# Patient Record
Sex: Male | Born: 1970
Health system: Southern US, Community
[De-identification: ages and names within clinical notes are randomized; demographics above are authoritative.]

## PROBLEM LIST (undated history)

## (undated) ENCOUNTER — Emergency Department (HOSPITAL_COMMUNITY): Admission: EM | Payer: Self-pay | Source: Home / Self Care

## (undated) DIAGNOSIS — G4713 Recurrent hypersomnia: Secondary | ICD-10-CM

## (undated) DIAGNOSIS — G2581 Restless legs syndrome: Secondary | ICD-10-CM

## (undated) DIAGNOSIS — F429 Obsessive-compulsive disorder, unspecified: Secondary | ICD-10-CM

## (undated) DIAGNOSIS — I10 Essential (primary) hypertension: Secondary | ICD-10-CM

## (undated) DIAGNOSIS — E785 Hyperlipidemia, unspecified: Secondary | ICD-10-CM

## (undated) DIAGNOSIS — Z8489 Family history of other specified conditions: Secondary | ICD-10-CM

## (undated) DIAGNOSIS — G1221 Amyotrophic lateral sclerosis: Secondary | ICD-10-CM

## (undated) HISTORY — DX: Hyperlipidemia, unspecified: E78.5

## (undated) HISTORY — DX: Recurrent hypersomnia: G47.13

## (undated) HISTORY — DX: Amyotrophic lateral sclerosis: G12.21

## (undated) HISTORY — DX: Obsessive-compulsive disorder, unspecified: F42.9

## (undated) HISTORY — DX: Family history of other specified conditions: Z84.89

---

## 2000-10-12 ENCOUNTER — Ambulatory Visit (HOSPITAL_BASED_OUTPATIENT_CLINIC_OR_DEPARTMENT_OTHER): Admission: RE | Admit: 2000-10-12 | Discharge: 2000-10-12 | Payer: Self-pay | Admitting: Neurology

## 2000-11-15 ENCOUNTER — Emergency Department (HOSPITAL_COMMUNITY): Admission: EM | Admit: 2000-11-15 | Discharge: 2000-11-15 | Payer: Self-pay | Admitting: Emergency Medicine

## 2002-09-17 ENCOUNTER — Encounter
Admission: RE | Admit: 2002-09-17 | Discharge: 2002-12-16 | Payer: Self-pay | Admitting: Physical Medicine and Rehabilitation

## 2003-08-18 ENCOUNTER — Emergency Department (HOSPITAL_COMMUNITY): Admission: EM | Admit: 2003-08-18 | Discharge: 2003-08-18 | Payer: Self-pay | Admitting: Emergency Medicine

## 2005-04-07 ENCOUNTER — Emergency Department (HOSPITAL_COMMUNITY): Admission: EM | Admit: 2005-04-07 | Discharge: 2005-04-07 | Payer: Self-pay | Admitting: Emergency Medicine

## 2005-05-04 ENCOUNTER — Ambulatory Visit: Payer: Self-pay | Admitting: Family Medicine

## 2005-06-02 ENCOUNTER — Ambulatory Visit: Payer: Self-pay | Admitting: Family Medicine

## 2005-06-07 ENCOUNTER — Ambulatory Visit (HOSPITAL_COMMUNITY): Admission: RE | Admit: 2005-06-07 | Discharge: 2005-06-07 | Payer: Self-pay | Admitting: Family Medicine

## 2006-08-21 ENCOUNTER — Emergency Department (HOSPITAL_COMMUNITY): Admission: EM | Admit: 2006-08-21 | Discharge: 2006-08-21 | Payer: Self-pay | Admitting: Emergency Medicine

## 2009-04-22 ENCOUNTER — Encounter (HOSPITAL_COMMUNITY): Admission: RE | Admit: 2009-04-22 | Discharge: 2009-05-22 | Payer: Self-pay | Admitting: Orthopaedic Surgery

## 2009-10-27 ENCOUNTER — Emergency Department (HOSPITAL_COMMUNITY): Admission: EM | Admit: 2009-10-27 | Discharge: 2009-10-28 | Payer: Self-pay | Admitting: Emergency Medicine

## 2009-11-04 ENCOUNTER — Ambulatory Visit (HOSPITAL_COMMUNITY): Admission: RE | Admit: 2009-11-04 | Discharge: 2009-11-04 | Payer: Self-pay | Admitting: Family Medicine

## 2010-01-21 ENCOUNTER — Encounter (HOSPITAL_COMMUNITY): Admission: RE | Admit: 2010-01-21 | Discharge: 2010-02-20 | Payer: Self-pay | Admitting: Orthopaedic Surgery

## 2010-02-25 ENCOUNTER — Encounter (HOSPITAL_COMMUNITY): Admission: RE | Admit: 2010-02-25 | Discharge: 2010-03-27 | Payer: Self-pay | Admitting: Orthopaedic Surgery

## 2010-12-16 LAB — DIFFERENTIAL
Basophils Absolute: 0.1 10*3/uL (ref 0.0–0.1)
Eosinophils Relative: 1 % (ref 0–5)
Lymphocytes Relative: 34 % (ref 12–46)
Monocytes Absolute: 0.6 10*3/uL (ref 0.1–1.0)
Monocytes Relative: 6 % (ref 3–12)
Neutro Abs: 5.4 10*3/uL (ref 1.7–7.7)

## 2010-12-16 LAB — CBC
HCT: 43.8 % (ref 39.0–52.0)
Hemoglobin: 15.1 g/dL (ref 13.0–17.0)
RBC: 5.22 MIL/uL (ref 4.22–5.81)
RDW: 12.6 % (ref 11.5–15.5)

## 2010-12-16 LAB — URINALYSIS, ROUTINE W REFLEX MICROSCOPIC
Bilirubin Urine: NEGATIVE
Glucose, UA: 100 mg/dL — AB
Hgb urine dipstick: NEGATIVE
Ketones, ur: NEGATIVE mg/dL
Nitrite: NEGATIVE
Specific Gravity, Urine: >1.03 — ABNORMAL HIGH (ref 1.005–1.030)
pH: 6 (ref 5.0–8.0)

## 2010-12-16 LAB — BASIC METABOLIC PANEL
CO2: 27 mEq/L (ref 19–32)
GFR calc Af Amer: >60 mL/min (ref >60)
GFR calc non Af Amer: >60 mL/min (ref >60)
Glucose, Bld: 175 mg/dL — ABNORMAL HIGH (ref 70–99)
Potassium: 3.7 mEq/L (ref 3.5–5.1)
Sodium: 138 mEq/L (ref 135–145)

## 2010-12-16 LAB — POCT CARDIAC MARKERS
CKMB, poc: 1.1 ng/mL (ref 1.0–8.0)
Myoglobin, poc: 40 ng/mL (ref 12–200)
Troponin i, poc: <0.05 ng/mL (ref 0.00–0.09)

## 2011-02-21 ENCOUNTER — Ambulatory Visit: Payer: Medicaid Other | Attending: Family Medicine

## 2011-02-21 DIAGNOSIS — G4710 Hypersomnia, unspecified: Secondary | ICD-10-CM | POA: Insufficient documentation

## 2011-02-21 DIAGNOSIS — G4730 Sleep apnea, unspecified: Secondary | ICD-10-CM | POA: Insufficient documentation

## 2011-02-28 NOTE — Procedures (Signed)
NAMEHUNT, ZAJICEK             ACCOUNT NO.:  [id number]  MEDICAL RECORD NO.:  [id number]          PATIENT TYPE:  OUT  LOCATION:  SLEEP LAB                     FACILITY:  [facility]  PHYSICIAN:  Milbern Doescher A. Gerilyn Pilgrim, M.D. DATE OF BIRTH:  08-Apr-1971  DATE OF STUDY:  02/21/2011                           NOCTURNAL POLYSOMNOGRAM  REFERRING PHYSICIAN:  Kirke Shaggy [facility]  REFERRING PHYSICIAN:  Maryla Morrow. Modesto Charon, M.D.  INDICATIONS:  This is a 40 year old, who presents with hypersomnia, snoring and fatigue.  This study is being done to evaluate for obstructive sleep apnea syndrome.  INDICATION FOR STUDY:  EPWORTH SLEEPINESS SCORE:  MEDICATIONS:  Norvasc, metformin, aspirin, lisinopril, hydrochlorothiazide.  Epworth sleepiness scale 13.  BMI 34.  Architecture summary:  The total recording time is 392.5 minutes.  Sleep efficiency 75.7%, sleep latency 42.5 minutes.  REM latency 44.5 minutes, Stage N1 11.3%, N2 25.6%, N3 37.7% and REM sleep 25%.  Respiratory summary:  Baseline oxygen saturation is 96, lowest saturation 90 during non-REM sleep.  Diagnostic AHI is 9.1 and RDI also 9.1.  Limb movement summary:  PLM index 50.  Electrocardiogram summary:  Average heart rate is 68 with no significant dysrhythmias observed.  IMPRESSION: 1. Severe periodic limb movement disorder sleep. 2. Mild obstructive sleep apnea syndrome, not requiring positive     pressure treatment. 3. Abnormal sleep architecture, particularly with early REM latency.  RECOMMENDATIONS: 1. Consider dopamine agonist, such as Requip or Mirapex for treatment     of limb movement disorder. 2. Consider sleep consultation for further evaluation of early REM     latency if desired.  Potential etiologies of early REM latency     includes narcolepsy or REM rebound phenomenon from medications,     such as stimulants and antidepressants.  SLEEP ARCHITECTURE:  RESPIRATORY DATA:  OXYGEN DATA:  CARDIAC  DATA:  MOVEMENT-PARASOMNIA:  IMPRESSIONS-RECOMMENDATIONS:     Sircharles Holzheimer A. Gerilyn Pilgrim, M.D. Electronically Signed 03/01/2011 09:51:30    KAD/MEDQ  D:  02/28/2011 17:11:56  T:  02/28/2011 17:44:16  Job:  [id number]

## 2011-03-16 ENCOUNTER — Encounter: Payer: Self-pay | Admitting: Family Medicine

## 2011-03-16 DIAGNOSIS — E785 Hyperlipidemia, unspecified: Secondary | ICD-10-CM | POA: Insufficient documentation

## 2011-03-16 DIAGNOSIS — G4713 Recurrent hypersomnia: Secondary | ICD-10-CM | POA: Insufficient documentation

## 2011-03-16 DIAGNOSIS — G1221 Amyotrophic lateral sclerosis: Secondary | ICD-10-CM

## 2011-03-16 DIAGNOSIS — F429 Obsessive-compulsive disorder, unspecified: Secondary | ICD-10-CM | POA: Insufficient documentation

## 2011-06-21 ENCOUNTER — Other Ambulatory Visit: Payer: Self-pay | Admitting: Family Medicine

## 2011-06-21 DIAGNOSIS — D496 Neoplasm of unspecified behavior of brain: Secondary | ICD-10-CM

## 2011-06-23 ENCOUNTER — Ambulatory Visit (HOSPITAL_COMMUNITY)
Admission: RE | Admit: 2011-06-23 | Discharge: 2011-06-23 | Disposition: A | Discharge status: Home / Self Care | Payer: Medicaid Other | Source: Ambulatory Visit | Attending: Family Medicine | Admitting: Family Medicine

## 2011-06-23 DIAGNOSIS — D496 Neoplasm of unspecified behavior of brain: Secondary | ICD-10-CM

## 2011-06-23 DIAGNOSIS — Z09 Encounter for follow-up examination after completed treatment for conditions other than malignant neoplasm: Secondary | ICD-10-CM | POA: Insufficient documentation

## 2011-06-23 DIAGNOSIS — D332 Benign neoplasm of brain, unspecified: Secondary | ICD-10-CM | POA: Insufficient documentation

## 2011-08-23 ENCOUNTER — Emergency Department (HOSPITAL_COMMUNITY): Payer: Medicaid Other

## 2011-08-23 ENCOUNTER — Emergency Department (HOSPITAL_COMMUNITY)
Admission: EM | Admit: 2011-08-23 | Discharge: 2011-08-23 | Disposition: A | Discharge status: Home / Self Care | Payer: Medicaid Other | Attending: Emergency Medicine | Admitting: Emergency Medicine

## 2011-08-23 ENCOUNTER — Encounter (HOSPITAL_COMMUNITY): Payer: Self-pay

## 2011-08-23 DIAGNOSIS — I10 Essential (primary) hypertension: Secondary | ICD-10-CM | POA: Insufficient documentation

## 2011-08-23 DIAGNOSIS — E119 Type 2 diabetes mellitus without complications: Secondary | ICD-10-CM | POA: Insufficient documentation

## 2011-08-23 DIAGNOSIS — Z79899 Other long term (current) drug therapy: Secondary | ICD-10-CM | POA: Insufficient documentation

## 2011-08-23 DIAGNOSIS — M25529 Pain in unspecified elbow: Secondary | ICD-10-CM | POA: Insufficient documentation

## 2011-08-23 DIAGNOSIS — M7020 Olecranon bursitis, unspecified elbow: Secondary | ICD-10-CM | POA: Insufficient documentation

## 2011-08-23 DIAGNOSIS — M7021 Olecranon bursitis, right elbow: Secondary | ICD-10-CM

## 2011-08-23 HISTORY — DX: Restless legs syndrome: G25.81

## 2011-08-23 HISTORY — DX: Essential (primary) hypertension: I10

## 2011-08-23 MED ORDER — HYDROCODONE-ACETAMINOPHEN 5-325 MG PO TABS
2.0000 | ORAL_TABLET | Freq: Once | ORAL | Status: AC
  Administered 2011-08-23: 2 via ORAL
  Filled 2011-08-23: qty 2

## 2011-08-23 MED ORDER — DEXAMETHASONE SODIUM PHOSPHATE 4 MG/ML IJ SOLN
8.0000 mg | Freq: Once | INTRAMUSCULAR | Status: AC
  Administered 2011-08-23: 8 mg via INTRAMUSCULAR
  Filled 2011-08-23: qty 2

## 2011-08-23 MED ORDER — HYDROCODONE-ACETAMINOPHEN 10-325 MG PO TABS: ORAL_TABLET | ORAL | Status: DC

## 2011-08-23 MED ORDER — DICLOFENAC SODIUM 75 MG PO TBEC: 75.0000 mg | DELAYED_RELEASE_TABLET | Freq: Two times a day (BID) | ORAL | Status: AC

## 2011-08-23 NOTE — ED Notes (Signed)
Hit right elbow on tractor, area still swollen, able to move w/o difficulty.  +cap refill.

## 2011-08-23 NOTE — ED Provider Notes (Signed)
History     CSN: [id number] Arrival date & time: 08/23/2011  7:06 PM   First MD Initiated Contact with Patient 08/23/11 1953      Chief Complaint  Patient presents with  . Arm Injury    right    (Consider location/radiation/quality/duration/timing/severity/associated sxs/prior treatment) HPI Comments: Pt was moving equipment on a trailor and hit the right elbow on a tractor. After raking leaves and using a blower and other equipment, pt noted swelling of the right elbow with increase warmth and pain.  Patient is a 40 y.o. male presenting with arm injury. The history is provided by the patient.  Arm Injury  The incident occurred today. The injury mechanism was a direct blow. There is an injury to the right elbow. The pain is moderate. It is unlikely that a foreign body is present. Pertinent negatives include no chest pain, no numbness, no abdominal pain, no neck pain, no seizures and no cough. There have been no prior injuries to these areas. He is right-handed. He has been behaving normally. He has received no recent medical care.    Past Medical History  Diagnosis Date  . OCD (obsessive compulsive disorder)   . Kleine-Levin syndrome   . Hyperlipidemia   . ALS (amyotrophic lateral sclerosis)   . Diabetes mellitus   . Hypertension   . Restless leg     History reviewed. No pertinent past surgical history.  No family history on file.  History  Substance Use Topics  . Smoking status: Never Smoker   . Smokeless tobacco: Not on file  . Alcohol Use: Yes      Review of Systems  Constitutional: Negative for activity change.       All ROS Neg except as noted in HPI  HENT: Negative for nosebleeds and neck pain.   Eyes: Negative for photophobia and discharge.  Respiratory: Negative for cough, shortness of breath and wheezing.   Cardiovascular: Negative for chest pain and palpitations.  Gastrointestinal: Negative for abdominal pain and blood in stool.  Genitourinary:  Negative for dysuria, frequency and hematuria.  Musculoskeletal: Positive for arthralgias. Negative for back pain.  Skin: Negative.   Neurological: Negative for dizziness, seizures, speech difficulty and numbness.  Psychiatric/Behavioral: Positive for sleep disturbance. Negative for hallucinations and confusion. The patient is nervous/anxious.     Allergies  Ace inhibitors  Home Medications   Current Outpatient Rx  Name Route Sig Dispense Refill  . AMLODIPINE BESYLATE 5 MG PO TABS Oral Take 5 mg by mouth daily.      Marland Kitchen GLIMEPIRIDE 2 MG PO TABS Oral Take 2 mg by mouth daily.      Marland Kitchen METFORMIN HCL ER (MOD) 500 MG PO TB24 Oral Take 500 mg by mouth 2 (two) times daily.     Marland Kitchen ROPINIROLE HCL PO Oral Take 1 tablet by mouth daily.      Marland Kitchen DICLOFENAC SODIUM 75 MG PO TBEC Oral Take 1 tablet (75 mg total) by mouth 2 (two) times daily. 10 tablet 0  . HYDROCODONE-ACETAMINOPHEN 10-325 MG PO TABS  1 or 2 tabs po q4h prn pain 20 tablet 0  . SIMVASTATIN 20 MG PO TABS Oral Take 20 mg by mouth at bedtime.        BP 148/102  Pulse 103  Temp(Src) 98.6 F (37 C) (Oral)  Resp 20  Ht 6\' 1"  (1.854 m)  Wt 260 lb (117.935 kg)  BMI 34.30 kg/m2  SpO2 98%  Physical Exam  Nursing note and vitals reviewed.  Constitutional: He is oriented to person, place, and time. He appears well-developed and well-nourished.  Non-toxic appearance.  HENT:  Head: Normocephalic.  Right Ear: Tympanic membrane and external ear normal.  Left Ear: Tympanic membrane and external ear normal.  Eyes: EOM and lids are normal. Pupils are equal, round, and reactive to light.  Neck: Normal range of motion. Neck supple. Carotid bruit is not present.  Cardiovascular: Normal rate, regular rhythm, normal heart sounds, intact distal pulses and normal pulses.   Pulmonary/Chest: Breath sounds normal. No respiratory distress.  Abdominal: Soft. Bowel sounds are normal. There is no tenderness. There is no guarding.  Musculoskeletal: Normal range  of motion. He exhibits tenderness.       The right elbow is swollen. There in increase warmth and tenderness of the  olecranon bursa area. Soreness more with palpation than movement.  Lymphadenopathy:       Head (right side): No submandibular adenopathy present.       Head (left side): No submandibular adenopathy present.    He has no cervical adenopathy.  Neurological: He is alert and oriented to person, place, and time. He has normal strength. No cranial nerve deficit or sensory deficit.  Skin: Skin is warm and dry.  Psychiatric: He has a normal mood and affect. His speech is normal.    ED Course:Sling applied by nursing.  Procedures (including critical care time)  Labs Reviewed - No data to display Dg Elbow Complete Right  08/23/2011  *RADIOLOGY REPORT*  Clinical Data: Right elbow injury with pain and swelling.  RIGHT ELBOW - COMPLETE 3+ VIEW  Comparison: None.  Findings: No visible acute fracture or joint effusion identified. Alignment is normal.  Soft tissue swelling present.  IMPRESSION: No acute fracture identified.  Original Report Authenticated By: Reola Calkins, M.D.     1. Olecranon bursitis of right elbow       MDM  I have reviewed nursing notes, vital signs, and all appropriate lab and imaging results for this patient. Pulse ox 98% on room air. WNL. Pt will try immobilization with a sling, short course of  Diclofenac, and see his orthopedic MD if swelling is not improving or getting worse.       Kathie Dike, [place] 08/23/11 2059

## 2011-08-24 NOTE — ED Provider Notes (Signed)
Medical screening examination/treatment/procedure(s) were performed by non-physician practitioner and as supervising physician I was immediately available for consultation/collaboration.  Alphia Behanna T Jnae Thomaston, MD 08/24/11 2136 

## 2011-09-06 ENCOUNTER — Other Ambulatory Visit (HOSPITAL_COMMUNITY): Payer: Self-pay | Admitting: Orthopedic Surgery

## 2011-09-06 ENCOUNTER — Other Ambulatory Visit: Payer: Self-pay

## 2011-09-06 ENCOUNTER — Emergency Department (HOSPITAL_COMMUNITY)
Admission: EM | Admit: 2011-09-06 | Discharge: 2011-09-06 | Disposition: A | Discharge status: Home / Self Care | Payer: Medicaid Other | Attending: Emergency Medicine | Admitting: Emergency Medicine

## 2011-09-06 ENCOUNTER — Encounter (HOSPITAL_COMMUNITY): Payer: Self-pay | Admitting: *Deleted

## 2011-09-06 DIAGNOSIS — I10 Essential (primary) hypertension: Secondary | ICD-10-CM | POA: Insufficient documentation

## 2011-09-06 DIAGNOSIS — G1221 Amyotrophic lateral sclerosis: Secondary | ICD-10-CM | POA: Insufficient documentation

## 2011-09-06 DIAGNOSIS — M7020 Olecranon bursitis, unspecified elbow: Secondary | ICD-10-CM | POA: Insufficient documentation

## 2011-09-06 DIAGNOSIS — M79609 Pain in unspecified limb: Secondary | ICD-10-CM | POA: Insufficient documentation

## 2011-09-06 DIAGNOSIS — E119 Type 2 diabetes mellitus without complications: Secondary | ICD-10-CM | POA: Insufficient documentation

## 2011-09-06 DIAGNOSIS — G4713 Recurrent hypersomnia: Secondary | ICD-10-CM | POA: Insufficient documentation

## 2011-09-06 DIAGNOSIS — M79603 Pain in arm, unspecified: Secondary | ICD-10-CM

## 2011-09-06 DIAGNOSIS — M542 Cervicalgia: Secondary | ICD-10-CM

## 2011-09-06 DIAGNOSIS — E785 Hyperlipidemia, unspecified: Secondary | ICD-10-CM | POA: Insufficient documentation

## 2011-09-06 MED ORDER — OXYCODONE-ACETAMINOPHEN 5-325 MG PO TABS: 1.0000 | ORAL_TABLET | ORAL | Status: AC | PRN

## 2011-09-06 MED ORDER — OXYCODONE-ACETAMINOPHEN 5-325 MG PO TABS
2.0000 | ORAL_TABLET | Freq: Once | ORAL | Status: AC
  Administered 2011-09-06: 2 via ORAL
  Filled 2011-09-06: qty 2

## 2011-09-06 NOTE — ED Notes (Signed)
Pt c/o right arm pain and tingling that radiates through chest.  Swelling noted to elbow

## 2011-09-06 NOTE — ED Provider Notes (Addendum)
History     CSN: [id number] Arrival date & time: 09/06/2011  2:36 AM   First MD Initiated Contact with Patient 09/06/11 [phone]      Chief Complaint  Patient presents with  . Arm Pain    (Consider location/radiation/quality/duration/timing/severity/associated sxs/prior treatment) Patient is a 40 y.o. male presenting with arm pain.  Arm Pain   the patient has had right arm pain for about 2 weeks and was recently diagnosed with olecranon bursitis by his hand surgeon Dr. Bradly Bienenstock.  The patient had been on pain medicine and was reporting improvement in his pain.  Today he reports developing pain in his right upper extremity it radiated up to his right chest.  This started approximately 4 and half hours ago and has been constant.  He reports the majority of his pain is in his right forearm and is also continued in his right elbow.  Reports no new swelling.  He reports no erythema.  He reports continued swelling of his right elbow that hasn't improved much in the past week.  He had been on pain medication but is currently out.  He denies shortness of breath.  He denies jaw pain.  Denies diaphoresis.  He denies nausea and vomiting.  He has no prior history of cardiac disease.  He does have hyperlipidemia and hypertension as well as borderline diabetes.  He does not smoke cigarettes.  He has no early family history of heart disease.  Nothing worsens the symptoms.  Nothing improves his symptoms.  Symptoms are constant.  They're mild in severity  Past Medical History  Diagnosis Date  . OCD (obsessive compulsive disorder)   . Kleine-Levin syndrome   . Hyperlipidemia   . ALS (amyotrophic lateral sclerosis)   . Diabetes mellitus   . Hypertension   . Restless leg     History reviewed. No pertinent past surgical history.  History reviewed. No pertinent family history.  History  Substance Use Topics  . Smoking status: Never Smoker   . Smokeless tobacco: Not on file  . Alcohol Use: Yes       Review of Systems  All other systems reviewed and are negative.    Allergies  Ace inhibitors  Home Medications   Current Outpatient Rx  Name Route Sig Dispense Refill  . AMLODIPINE BESYLATE 5 MG PO TABS Oral Take 5 mg by mouth daily.      Marland Kitchen DICLOFENAC SODIUM 75 MG PO TBEC Oral Take 1 tablet (75 mg total) by mouth 2 (two) times daily. 10 tablet 0  . GLIMEPIRIDE 2 MG PO TABS Oral Take 2 mg by mouth daily.      Marland Kitchen HYDROCODONE-ACETAMINOPHEN 10-325 MG PO TABS  1 or 2 tabs po q4h prn pain 20 tablet 0  . METFORMIN HCL ER (MOD) 500 MG PO TB24 Oral Take 500 mg by mouth 2 (two) times daily.     . OXYCODONE-ACETAMINOPHEN 5-325 MG PO TABS Oral Take 1 tablet by mouth every 4 (four) hours as needed for pain. 15 tablet 0  . ROPINIROLE HCL PO Oral Take 1 tablet by mouth daily.      Marland Kitchen SIMVASTATIN 20 MG PO TABS Oral Take 20 mg by mouth at bedtime.        BP 143/94  Pulse 81  Temp 97.9 F (36.6 C)  Resp 20  Ht 6\' 1"  (1.854 m)  Wt 255 lb (115.667 kg)  BMI 33.64 kg/m2  SpO2 99%  Physical Exam  Nursing note and vitals reviewed. Constitutional:  He is oriented to person, place, and time. He appears well-developed and well-nourished.  HENT:  Head: Normocephalic and atraumatic.  Eyes: EOM are normal.  Neck: Normal range of motion.  Cardiovascular: Normal rate, regular rhythm, normal heart sounds and intact distal pulses.   Pulmonary/Chest: Effort normal and breath sounds normal. No respiratory distress.  Abdominal: Soft. He exhibits no distension. There is no tenderness.  Musculoskeletal: Normal range of motion.       Patient with swelling around his right olecranon.  There is no warmth or erythema of this area.  He has a normal right radial pulse.  Does appear to be some ecchymosis of his right anterior medial forearm which appears old and is beginning to turn yellow.  He has no swelling of his right forearm or upper arm as compared to his left  Neurological: He is alert and oriented to  person, place, and time.  Skin: Skin is warm and dry.  Psychiatric: He has a normal mood and affect. Judgment normal.    ED Course  Procedures (including critical care time)   Date: 09/06/2011  Rate: 70  Rhythm: normal sinus rhythm  QRS Axis: normal  Intervals: normal  ST/T Wave abnormalities: normal  Conduction Disutrbances:none  Narrative Interpretation:   Old EKG Reviewed: No significant changes noted     Labs Reviewed - No data to display No results found.   1. Pain of upper extremity   2. Olecranon bursitis       MDM  Is unclear whether patient's pain is returning.  His EKG is normal I do not believe this to be an anginal symptoms the patient.  The patient reports improvement in his pain with pain medication given in the emergency department.  I will refer the patient back to his hand surgeon.  There is no signs of a DVT at this time.  I don't see any secondary signs of infection        Lyanne Co, MD 09/06/11 1610  Lyanne Co, MD 09/06/11 [phone]

## 2011-09-08 ENCOUNTER — Ambulatory Visit (HOSPITAL_COMMUNITY)
Admission: RE | Admit: 2011-09-08 | Discharge: 2011-09-08 | Disposition: A | Discharge status: Home / Self Care | Payer: Medicaid Other | Source: Ambulatory Visit | Attending: Orthopedic Surgery | Admitting: Orthopedic Surgery

## 2011-09-08 DIAGNOSIS — M542 Cervicalgia: Secondary | ICD-10-CM

## 2011-09-08 DIAGNOSIS — M5020 Other cervical disc displacement, unspecified cervical region: Secondary | ICD-10-CM | POA: Insufficient documentation

## 2011-09-08 DIAGNOSIS — M5124 Other intervertebral disc displacement, thoracic region: Secondary | ICD-10-CM | POA: Insufficient documentation

## 2011-09-08 DIAGNOSIS — M79609 Pain in unspecified limb: Secondary | ICD-10-CM | POA: Insufficient documentation

## 2012-03-02 ENCOUNTER — Other Ambulatory Visit (HOSPITAL_COMMUNITY): Payer: Self-pay | Admitting: Orthopedic Surgery

## 2012-03-02 DIAGNOSIS — M47817 Spondylosis without myelopathy or radiculopathy, lumbosacral region: Secondary | ICD-10-CM

## 2012-03-03 ENCOUNTER — Ambulatory Visit (HOSPITAL_COMMUNITY)
Admission: RE | Admit: 2012-03-03 | Discharge: 2012-03-03 | Disposition: A | Discharge status: Home / Self Care | Payer: Medicaid Other | Source: Ambulatory Visit | Attending: Orthopedic Surgery | Admitting: Orthopedic Surgery

## 2012-03-03 DIAGNOSIS — M542 Cervicalgia: Secondary | ICD-10-CM | POA: Insufficient documentation

## 2012-03-03 DIAGNOSIS — M5020 Other cervical disc displacement, unspecified cervical region: Secondary | ICD-10-CM | POA: Insufficient documentation

## 2012-03-03 DIAGNOSIS — M47817 Spondylosis without myelopathy or radiculopathy, lumbosacral region: Secondary | ICD-10-CM

## 2012-03-03 DIAGNOSIS — M25519 Pain in unspecified shoulder: Secondary | ICD-10-CM | POA: Insufficient documentation

## 2012-05-09 ENCOUNTER — Telehealth (HOSPITAL_COMMUNITY): Payer: Self-pay | Admitting: Dietician

## 2012-05-09 NOTE — Telephone Encounter (Signed)
Appointment scheduled for 05/19/12 at 9:00 AM.

## 2012-05-09 NOTE — Telephone Encounter (Signed)
Received referral via fax from [facility] (Dr. Modesto Charon) for dx: diabetes, hyperlipidemia, HTN, and Kleine-Levin syndrome.

## 2012-05-12 ENCOUNTER — Telehealth (HOSPITAL_COMMUNITY): Payer: Self-pay | Admitting: Dietician

## 2012-05-12 NOTE — Telephone Encounter (Signed)
Mailed appointment confirmation letter and instructions for appointment scheduled 05/19/12 at 9:00 AM via [place] Mail.

## 2012-05-19 ENCOUNTER — Telehealth (HOSPITAL_COMMUNITY): Payer: Self-pay | Admitting: Dietician

## 2012-05-19 NOTE — Telephone Encounter (Signed)
Pt was a no-show for appointment scheduled for 05/19/2012 at 9:00 AM. Sent letter to pt home notifying pt of no-show and requesting rescheduling appointment.

## 2012-09-06 ENCOUNTER — Ambulatory Visit: Admit: 2012-09-06 | Payer: Self-pay | Admitting: Orthopedic Surgery

## 2012-09-06 SURGERY — ANTERIOR CERVICAL DECOMPRESSION/DISCECTOMY FUSION 3 LEVELS
Anesthesia: General

## 2012-09-25 ENCOUNTER — Other Ambulatory Visit (HOSPITAL_COMMUNITY): Payer: Self-pay | Admitting: Orthopedic Surgery

## 2012-09-25 ENCOUNTER — Ambulatory Visit (HOSPITAL_COMMUNITY)
Admission: RE | Admit: 2012-09-25 | Discharge: 2012-09-25 | Disposition: A | Discharge status: Home / Self Care | Payer: Medicaid Other | Source: Ambulatory Visit | Attending: Orthopedic Surgery | Admitting: Orthopedic Surgery

## 2012-09-25 DIAGNOSIS — M545 Low back pain: Secondary | ICD-10-CM

## 2012-09-25 DIAGNOSIS — M51379 Other intervertebral disc degeneration, lumbosacral region without mention of lumbar back pain or lower extremity pain: Secondary | ICD-10-CM | POA: Insufficient documentation

## 2012-09-25 DIAGNOSIS — M5137 Other intervertebral disc degeneration, lumbosacral region: Secondary | ICD-10-CM | POA: Insufficient documentation

## 2012-09-28 ENCOUNTER — Encounter (HOSPITAL_COMMUNITY): Payer: Self-pay | Admitting: Vascular Surgery

## 2012-09-28 NOTE — Consult Note (Addendum)
Anesthesia chart review:  Patient is a 42 year old male scheduled for C4-7 ACDF by Dr. Shon Baton on 10/04/12.  History includes non-smoker, obesity, HLD, HTN, DM2, Kleine-Levin Syndrome (KLS; rare neurological disorder characterized by persistent episodic hypersomnia and mood changes), OCD, RLS.  ALS is also listed in his Epic history, but not in his cardiology records.  (Question if ALS was entered instead of KLS.  Have asked his PAT RN to clarify when patient arrives for his PAT appointment.  I've also asked her to page me before he leaves PAT.)  PCP is Dr. Leodis Sias.  Dr. Modesto Charon recommended cardiology evaluation prior to surgery.  Patient was seen by Dr. Jacinto Halim at [facility] on 07/11/12 and felt patient was acceptable risk for surgery.  Echo on 07/06/12 showed moderated concentric LVH, normal diastolic filling, normal global LV wall motion and systolic function.  Calculated EF 51%, visual EF 55-60%.  LA cavity mildly dilated.  RA cavity mild to moderately dilated.  Inter atrial septum bulges to the left suggests elevated right heart pressure.  RV cavity is mild to moderately enlarged.  Normal global RV wall motion.  Trace TR.  Mild pulmonary hypertension.  Exercise stress test on 06/28/12 showed no ST/T changes of ischemia, sinus arrhythmias, normal BP response to stress, normal exercise tolerance, 11.3 METS.  EKG on 06/16/12 showed NSR, incomplete right BBB, poor r wave progression.  CXR and labs are pending his PAT appointment on 09/29/12.  Shonna Chock, PA-C 09/28/12 1800  Addendum: 09/29/12 1700 Patient was a no show for his PAT appointment.  His PAT RN left a message for patient to call, but she did not hear back from him.  She told me that she did notify Dr. Shon Baton' office.

## 2012-09-29 ENCOUNTER — Encounter (HOSPITAL_COMMUNITY)
Admission: RE | Admit: 2012-09-29 | Discharge: 2012-09-29 | Disposition: A | Discharge status: Home / Self Care | Payer: Medicaid Other | Source: Ambulatory Visit | Attending: Orthopedic Surgery | Admitting: Orthopedic Surgery

## 2012-09-29 DIAGNOSIS — Z01818 Encounter for other preprocedural examination: Secondary | ICD-10-CM | POA: Insufficient documentation

## 2012-09-29 DIAGNOSIS — M4712 Other spondylosis with myelopathy, cervical region: Secondary | ICD-10-CM | POA: Diagnosis present

## 2012-09-29 NOTE — H&P (Signed)
History of Present Illness   The patient is a 42 year old male who presents with neck pain. The patient is seen today in referral from Dr. Ethelene Hal and for a surgical consult. The patient reports symptoms involving the entire neck which began 1 year(s) ago. Symptoms include neck pain, numbness, arm numbness and upper extremity weakness. The pain radiates to the left arm (worse) and right arm. The patient describes the pain as sharp and stinging. Onset was gradual. The symptoms occur constantly.The patient describes their symptoms as moderate in severity.The patient does feel that the symptoms are worsening. Symptoms are exacerbated by turning the head to the right. Prior to being seen today the patient was previously evaluated by a colleague. Past evaluation has included cervical spine x-rays and cervical spine MRI. Past treatment has included opioid analgesics and spinal injections (no help).   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 Ethelene Hal with cervical injections for neck and arm pain. The patient had an MRI done in December of 2012 which demonstrated a left C3-4 disc protrusion, no significant foraminal stenosis, no cord signal changes. He has moderate broad based disc protrusion right-sided C4-5 with mass effect on the right C5 nerve root, moderate to large right disc herniation at C5-6 with mass effect on the right C6 nerve root and moderate to large disc herniation osteophyte at C6-7, again causing right C7 nerve compression. X-rays demonstrated some multi-level degenerative cervical disease with loss of normal cervical lordosis.    The patient states he was actually doing okay with the injections and conservative management until this past Sunday. He was at the funeral of his uncle, and he had to be a Energy manager. He had to do a lot of excess work, and now he has acute severe increase in his neck and left arm pain. As a  result, he was sent to me for further evaluation and treatment.    Allergies No Known Allergies. 08/16/2011   Social History Tobacco / smoke exposure. no Pain Contract. no Drug/Alcohol Rehab (Previously). no Drug/Alcohol Rehab (Currently). no Number of flights of stairs before winded. 2-3 Tobacco use. never smoker; uses less than half 1/2 can(s) smokeless per week Alcohol use. never consumed alcohol Marital status. single Living situation. live alone Illicit drug use. no Exercise. Exercises daily; does running / walking Current work status. disabled   Medication History Percocet (10-325MG  Tablet, 1 Oral three times daily, as needed, Taken starting 03/01/2012) Active. Amlodipine-Atorvastatin (10-10MG  Tablet, Oral) Active. Glimepiride (1MG  Tablet, Oral) Active. MetFORMIN HCl (1000MG  Tablet, Oral) Active. ROPINIRole HCl (1MG  Tablet, Oral) Active. Diclofenac Sodium CR (100MG  Tablet ER 24HR, Oral) Active.   Past Surgical History No pertinent past surgical history   Other Problems Olecranon Bursitis (726.33) Lumbago (724.2). 05/22/2005 Spondylolisthesis, congenital (756.12) High blood pressure   Objective Transcription  On clinical exam, he's a pleasant gentleman, who appears his stated age, in no acute distress. He has neck pain with palpation and ROM. No real shoulder, elbow or wrist pain with joint ROM. Intact peripheral pulses. Normal gait pattern. Negative Babinski testing with Hoffman's sign. 2+ symmetrical DTRs in the upper extremity. He's got trace weakness of the right wrist flexor and the right bicep. He's got 5/5 strength in the left upper extremity, no numbness or dysesthesias. Negative Spurling's sign.  The patient's MRI done March 03, 2012, demonstrates three-level cervical disk pathology C4-5, C5-6 and C6-7 with equivocal right sided abnormal cord  signal changes at C4-5 and C5-6. The canal diameters are 5 mm and 4 mm on that right  side. C3-4 there is central and left sided disk protrusion but clinically the patient does not complain of left sided C4 nerve pain.  Plans Transcription  At this point, given the fact that the patient's symptoms have been progressively getting worse over the last year I have recommended we proceed with a three-level anterior cervical diskectomy and fusion. The risks as I have explained to the patient include infection, bleeding, nerve damage, death, stroke, paralysis, failure to heal, need for further surgery, ongoing or worse pain, throat pain, swallowing difficulties, hoarseness in the voice, worsening arm pain, worsening neck pain, and adjacent segment disease meaning the levels above can break down or level below can break down and require more surgery. The patient was present for the dictation. He is expressing an understanding of the procedure itself and the risks. Because this is a multilevel procedure I am going to recommend a postoperative external bone stimulator to help improve the chances of a solid fusion. I will go ahead and get preoperative medical clearance from the patient's care provider.  Patient elected to wait on surgery.  Was seen again in the office because of worsening pain.  At that time he decided to proceed with surgery.  I did review the risk with him to include infection, bleeding, nerve damage, death , stroke, paralysis, failure to heal, need for further surgery, ongoing or worse pain, temporary or permanent throat pain, swallowing difficulties, hoarseness in the voice, nonunion, he does not fuse and the need for posterior surgery and instrumention if he develops a nonunion.  At this point, as we are seven months out from his previous MRI, I will get a new MRI this week so we have an updated version before his surgery on 10-04-12. If he has any significant changes, I will contact him and see him back before surgery. However, I do not think there will be significant changes.  Both  he and his sister are present for the dictation.

## 2012-10-03 MED ORDER — CEFAZOLIN SODIUM-DEXTROSE 2-3 GM-% IV SOLR: 2.0000 g | INTRAVENOUS | Status: DC

## 2012-10-03 NOTE — Progress Notes (Signed)
I did not reach patient on the phone,  I left a message- NPO arrival time- 1030, Valet parking, Meds to take Norvasc, Ropomirole, and if needed Norco.

## 2012-10-04 ENCOUNTER — Encounter (HOSPITAL_COMMUNITY): Admission: RE | Payer: Self-pay | Source: Ambulatory Visit

## 2012-10-04 ENCOUNTER — Ambulatory Visit (HOSPITAL_COMMUNITY): Admission: RE | Admit: 2012-10-04 | Payer: Medicaid Other | Source: Ambulatory Visit | Admitting: Orthopedic Surgery

## 2012-10-04 SURGERY — ANTERIOR CERVICAL DECOMPRESSION/DISCECTOMY FUSION 3 LEVELS
Anesthesia: General | Site: Neck

## 2012-10-04 NOTE — H&P (Signed)
Patient did not show up for anesthesia pre-op this AM nor is he here for surgery As a result of patient no-show his case was cancelled

## 2012-10-27 DIAGNOSIS — M47812 Spondylosis without myelopathy or radiculopathy, cervical region: Secondary | ICD-10-CM | POA: Diagnosis present

## 2012-10-27 NOTE — H&P (Signed)
   History of Present Illness The patient is a 42 year old male who presents today for follow up of their neck. The patient is being followed for their neck pain. They are 2 year(s) out from when symptoms began. Symptoms reported today include: pain. The patient feels that they are doing poorly. Note for "Follow-up Neck": discuss re-scheduling surgery  Subjective Transcription  He returns today for follow up. Unfortunately because of a death in the family he was not able to present himself for surgery. He continues to have significant neck pain with radiation into the left arm. He is also having problems into the right side as well. He states that things have gotten progressively worse. His cervical MRI from 09/30/12 demonstrates marked right lateral recess stenosis from disc protrusion herniation at C4-5, C5-6 and C6-7. There is mild disease at the C3-4 level. No significant collapse of that disc.  Allergies No Known Allergies. 08/16/2011   Social History Living situation. live alone Marital status. single Alcohol use. never consumed alcohol Current work status. disabled Exercise. Exercises daily; does running / walking Illicit drug use. no Drug/Alcohol Rehab (Previously). no Pain Contract. no Tobacco / smoke exposure. no Tobacco use. never smoker; uses less than half 1/2 can(s) smokeless per week Number of flights of stairs before winded. 2-3 Drug/Alcohol Rehab (Currently). no   Medication History Percocet (10-325MG  Tablet, 1 Oral three times daily, as needed, Taken starting 10/13/2011) Active. Amlodipine-Atorvastatin (10-10MG  Tablet, Oral) Active. Glimepiride (1MG  Tablet, Oral) Active. MetFORMIN HCl (1000MG  Tablet, Oral) Active. ROPINIRole HCl (1MG  Tablet, Oral) Active. Diclofenac Sodium CR (100MG  Tablet ER 24HR, Oral) Active.  Objective Transcription  Caesar is a pleasant gentleman who appears his stated age in no acute distress. He is alert and  oriented times three. Pain with cervical spine palpation and range of motion. No obvious skin lesions, abrasions, contusions of the anterior cervical spine. No shortness of breath or chest pain. Lungs are clear to auscultation. Heart regular rate and rhythm. Abdomen is soft, nontender. No history of incontinence of bowel or bladder. He has numbness and dysesthesias in the right and left upper extremities, the right side more pronounced. He has trace weakness of his deltoid, biceps and triceps function. Wrist extensor seems to be intact. Grip strength is intact. He has no real shoulder, elbow or wrist pain with joint range of motion.    Assessments Transcription  At this point in time the patient still has multilevel cervical spondylitic disease.  Plans Transcription  At this point given the progressive nature of his symptoms over the last two years I think it is reasonable to proceed with surgery. We had a discussion. This is the same surgical procedure we had planned earlier this month. He has expressed an understanding of the risks and benefits. His mother is also present for the dictation. Both of their questions were encouraged and they both expressed understanding of the procedure itself and the risks and benefits. Because of the multilevel nature of the procedure I would want to do an external bone stimulator to improve his chances of fusion.  PCP has provide medical clearance for surgery  Alvy Beal, MD

## 2012-10-31 ENCOUNTER — Encounter (HOSPITAL_COMMUNITY): Payer: Self-pay

## 2012-11-01 ENCOUNTER — Ambulatory Visit (HOSPITAL_COMMUNITY)
Admission: RE | Admit: 2012-11-01 | Discharge: 2012-11-01 | Disposition: A | Discharge status: Home / Self Care | Payer: Medicaid Other | Source: Ambulatory Visit | Attending: Anesthesiology | Admitting: Anesthesiology

## 2012-11-01 ENCOUNTER — Encounter (HOSPITAL_COMMUNITY)
Admission: RE | Admit: 2012-11-01 | Discharge: 2012-11-01 | Disposition: A | Discharge status: Home / Self Care | Payer: Medicaid Other | Source: Ambulatory Visit | Attending: Orthopedic Surgery | Admitting: Orthopedic Surgery

## 2012-11-01 ENCOUNTER — Other Ambulatory Visit (HOSPITAL_COMMUNITY): Payer: Self-pay | Admitting: Orthopedic Surgery

## 2012-11-01 ENCOUNTER — Encounter (HOSPITAL_COMMUNITY): Payer: Self-pay

## 2012-11-01 DIAGNOSIS — M47812 Spondylosis without myelopathy or radiculopathy, cervical region: Secondary | ICD-10-CM | POA: Insufficient documentation

## 2012-11-01 DIAGNOSIS — Z01812 Encounter for preprocedural laboratory examination: Secondary | ICD-10-CM | POA: Insufficient documentation

## 2012-11-01 DIAGNOSIS — Z01818 Encounter for other preprocedural examination: Secondary | ICD-10-CM | POA: Insufficient documentation

## 2012-11-01 DIAGNOSIS — Z419 Encounter for procedure for purposes other than remedying health state, unspecified: Secondary | ICD-10-CM

## 2012-11-01 LAB — BASIC METABOLIC PANEL
CO2: 27 mEq/L (ref 19–32)
Chloride: 100 mEq/L (ref 96–112)
Potassium: 4 mEq/L (ref 3.5–5.1)
Sodium: 139 mEq/L (ref 135–145)

## 2012-11-01 LAB — CBC
HCT: 47.4 % (ref 39.0–52.0)
MCV: 80.5 fL (ref 78.0–100.0)
Platelets: 181 10*3/uL (ref 150–400)
RBC: 5.89 MIL/uL — ABNORMAL HIGH (ref 4.22–5.81)
WBC: 11.7 10*3/uL — ABNORMAL HIGH (ref 4.0–10.5)

## 2012-11-01 NOTE — Progress Notes (Addendum)
Pt here for PAT.  Sleep Apnea/Study: Pt reports having one-neg.  Scores neg using STOP-BANG tool.  Reports Dr. Modesto Charon sent Dr.   Shon Baton copies of sleep study report. Echo: Denies Stress: 05/2012-In clearance report from Dr. Modesto Charon sent to Dr. Shon Baton. Heart Cath: Denies    Pt was scheduled for procedure in January-was cancelled.  Preoperatively, A. Zelenak,PA, had reviewed chart and received records from Dr. Shon Baton office re: medical clearance.  Unable to find those records at this time.  Dr. Shon Baton office called & requested for them to fax medical clearance records, again.  Marchelle Folks from Medical Records stated she would.  A. Zelenak,PA, scheduled to see pt at conclusion of PAT appt.

## 2012-11-01 NOTE — Progress Notes (Signed)
Reviewed case w/A. Zelenak, PA, prior to PAT appt. Dr. Shon Baton office called and requested clearance records to be faxed to [facility] per A. Zelenak, PA.  Marchelle Folks in Med Records stated she would fax.

## 2012-11-01 NOTE — Pre-Procedure Instructions (Signed)
Albert Mcdonald  11/01/2012   Your procedure is scheduled on:  Thursday, 11/09/2012@7 :30AM.  Report to [facility] at 5:30 AM.  Call this number if you have problems the morning of surgery: [phone]   Remember:   Do not eat food or drink liquids after midnight.   Take these medicines the morning of surgery with A SIP OF WATER: Amlodipine( Norvasc), Hydrocodone-Acetaminophen( if needed).   Do not wear jewelry, make-up or nail polish.  Do not wear lotions, powders, or perfumes. You may wear deodorant.  Do not shave 48 hours prior to surgery. Men may shave face and neck.  Do not bring valuables to the hospital.  Contacts, dentures or bridgework may not be worn into surgery.  Leave suitcase in the car. After surgery it may be brought to your room.  For patients admitted to the hospital, checkout time is 11:00 AM the day of discharge.   Patients discharged the day of surgery will not be allowed to drive home.  Name and phone number of your driver: Kathyrn Lass  Special Instructions: Shower using CHG 2 nights before surgery and the night before surgery.  If you shower the day of surgery use CHG.  Use special wash - you have one bottle of CHG for all showers.  You should use approximately 1/3 of the bottle for each shower.   Please read over the following fact sheets that you were given: Pain Booklet, Coughing and Deep Breathing and Surgical Site Infection Prevention

## 2012-11-02 NOTE — Consult Note (Addendum)
Anesthesia chart review: Patient is a 42 year old male scheduled for C4-7 ACDF by Dr. Shon Baton on  11/09/12.  This procedure was initially scheduled for 2012-10-16, but there was a death in his family.   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  PCP is Dr. Leodis Sias with [facility]. Dr. Modesto Charon recommended cardiology evaluation prior to surgery. Patient was seen by Dr. Jacinto Halim at [facility] on 07/11/12 and felt patient was acceptable risk for surgery.   Echo on 07/06/12 showed moderated concentric LVH, normal diastolic filling, normal global LV wall motion and systolic function. Calculated EF 51%, visual EF 55-60%. LA cavity mildly dilated. RA cavity mild to moderately dilated. Inter atrial septum bulges to the left suggests elevated right heart pressure. RV cavity is mild to moderately enlarged. Normal global RV wall motion. Trace TR. Mild pulmonary hypertension.   Exercise stress test on 06/28/12 showed no ST/T changes of ischemia, sinus arrhythmias, normal BP response to stress, normal exercise tolerance, 11.3 METS.   EKG on 06/16/12 showed NSR, incomplete right BBB, poor r wave progression.   CXR on 11/01/12 showed no active lung disease.  Mild degenerative changes in the lower thoracic spine.    Preoperative labs noted.     His BP was elevated at his PAT visit at 152/100, 162/102 (manual), and he had another diastolic reading of 107. Current medication list includes on amlodipine 5 mg; however, patient reports that Dr. Jacinto Halim had changed his anti-hypertensive medications.  Unfortunately, when his samples ran out, Mr. Kersten did not call for refills.  Today, I called and spoke with nurse Selena Batten at Dr. Nash Dimmer office.  I told her that Dr. Jacinto Halim had increased patient's amlodipine to  10 mg daily, discontinued his prinivil, and started him on Benicar 40/12.5 mg daily.  She reviewed with Dr. Tanya Nones who wants to restart patient's Benicar to improve patient's BP control pre-operatively.  Their office will contact the patient.    I spoke with Mr. Barmore at his PAT appointment yesterday.  He was pleasant but very fidgety.  (Reportedly, this is his norm.).  The last severe exacerbation of his KLS was when he was 42 years old and lasted for "weeks".  Now he tends to have problems sleeping--and typically only gets 4-5 hours at night, but will have periodic times when he may sleep 12 hours.  He has not had any prior surgeries.  In his teen years, he was going to get a tonsillectomy but was put off because of his KLS symptoms.  He also reports a degree of claustrophobia when something is put near/on his face, but feels he will likely need sedation prior to induction.  In my own research of KLS and anesthesia, potential triggers for a sleep episode in KLS could include flu-like illness, head trauma, physical exertion, psychological stress, general or local anesthesia, and menses.  Other theoretical complications with anesthesia and KLS include increased sensitivity to anesthetic agents, apneic episode and prolonged extubation time.  Although anesthesia is listed as a potential trigger, I could not find cases reported in the literature.  Treatment includes stimulants, anti-seizure medications, mood stabilizers, and light therapy.  I did discuss this with the patient and family member.  I notified anesthesiologist Dr. Krista Blue of patient's KLS history and updated Sherri at Dr. Shon Baton office of elevated BP with plan for  medication adjustment by his PCP.   Shonna Chock, PA-C 11/02/12 1421

## 2012-11-03 NOTE — Anesthesia Preprocedure Evaluation (Addendum)
Anesthesia Evaluation  Patient identified by MRN, date of birth, ID band Patient awake    Reviewed: Allergy & Precautions, H&P , NPO status , Patient's Chart, lab work & pertinent test results  Airway Mallampati: II  Neck ROM: full    Dental  (+) Edentulous Upper and Edentulous Lower   Pulmonary  breath sounds clear to auscultation        Cardiovascular hypertension,     Neuro/Psych    GI/Hepatic   Endo/Other  diabetes, Type 2  Renal/GU      Musculoskeletal   Abdominal   Peds  Hematology   Anesthesia Other Findings   Reproductive/Obstetrics                          Anesthesia Physical Anesthesia Plan  ASA: II  Anesthesia Plan: General   Post-op Pain Management:    Induction: Intravenous  Airway Management Planned: Oral ETT  Additional Equipment:   Intra-op Plan:   Post-operative Plan: Extubation in OR  Informed Consent: I have reviewed the patients History and Physical, chart, labs and discussed the procedure including the risks, benefits and alternatives for the proposed anesthesia with the patient or authorized representative who has indicated his/her understanding and acceptance.     Plan Discussed with: CRNA and Surgeon  Anesthesia Plan Comments: (See my note regarding KLS history.  Reports claustrophobia with objects on/over his face.  Shonna Chock, PA-C )       Anesthesia Quick Evaluation

## 2012-11-08 MED ORDER — CEFAZOLIN SODIUM-DEXTROSE 2-3 GM-% IV SOLR
2.0000 g | INTRAVENOUS | Status: AC
  Administered 2012-11-09: 2 g via INTRAVENOUS
  Filled 2012-11-08: qty 50

## 2012-11-09 ENCOUNTER — Ambulatory Visit (HOSPITAL_COMMUNITY): Payer: Medicaid Other

## 2012-11-09 ENCOUNTER — Inpatient Hospital Stay (HOSPITAL_COMMUNITY)
Admission: RE | Admit: 2012-11-09 | Discharge: 2012-11-10 | DRG: 472 | Disposition: A | Discharge status: Home / Self Care | Payer: Medicaid Other | Source: Ambulatory Visit | Attending: Orthopedic Surgery | Admitting: Orthopedic Surgery

## 2012-11-09 ENCOUNTER — Inpatient Hospital Stay (HOSPITAL_COMMUNITY): Payer: Medicaid Other

## 2012-11-09 ENCOUNTER — Encounter (HOSPITAL_COMMUNITY): Payer: Self-pay | Admitting: Anesthesiology

## 2012-11-09 ENCOUNTER — Ambulatory Visit (HOSPITAL_COMMUNITY): Payer: Medicaid Other | Admitting: Vascular Surgery

## 2012-11-09 ENCOUNTER — Encounter (HOSPITAL_COMMUNITY)
Admission: RE | Disposition: A | Discharge status: Home / Self Care | Payer: Self-pay | Source: Ambulatory Visit | Attending: Orthopedic Surgery

## 2012-11-09 ENCOUNTER — Encounter (HOSPITAL_COMMUNITY): Payer: Self-pay | Admitting: Vascular Surgery

## 2012-11-09 DIAGNOSIS — Z23 Encounter for immunization: Secondary | ICD-10-CM

## 2012-11-09 DIAGNOSIS — G2581 Restless legs syndrome: Secondary | ICD-10-CM | POA: Diagnosis present

## 2012-11-09 DIAGNOSIS — G1221 Amyotrophic lateral sclerosis: Secondary | ICD-10-CM | POA: Diagnosis present

## 2012-11-09 DIAGNOSIS — I10 Essential (primary) hypertension: Secondary | ICD-10-CM | POA: Diagnosis present

## 2012-11-09 DIAGNOSIS — E119 Type 2 diabetes mellitus without complications: Secondary | ICD-10-CM | POA: Diagnosis present

## 2012-11-09 DIAGNOSIS — E785 Hyperlipidemia, unspecified: Secondary | ICD-10-CM | POA: Diagnosis present

## 2012-11-09 DIAGNOSIS — G4713 Recurrent hypersomnia: Secondary | ICD-10-CM | POA: Diagnosis present

## 2012-11-09 DIAGNOSIS — M47812 Spondylosis without myelopathy or radiculopathy, cervical region: Secondary | ICD-10-CM | POA: Diagnosis present

## 2012-11-09 HISTORY — PX: ANTERIOR CERVICAL DECOMP/DISCECTOMY FUSION: SHX1161

## 2012-11-09 LAB — GLUCOSE, CAPILLARY: Glucose-Capillary: 238 mg/dL — ABNORMAL HIGH (ref 70–99)

## 2012-11-09 SURGERY — ANTERIOR CERVICAL DECOMPRESSION/DISCECTOMY FUSION 3 LEVELS
Anesthesia: General | Site: Spine Cervical | Wound class: Clean

## 2012-11-09 MED ORDER — EPHEDRINE SULFATE 50 MG/ML IJ SOLN
INTRAMUSCULAR | Status: DC | PRN
  Administered 2012-11-09: 10 mg via INTRAVENOUS

## 2012-11-09 MED ORDER — DEXAMETHASONE SODIUM PHOSPHATE 4 MG/ML IJ SOLN: 4.0000 mg | Freq: Once | INTRAMUSCULAR | Status: DC

## 2012-11-09 MED ORDER — METHOCARBAMOL 100 MG/ML IJ SOLN
500.0000 mg | Freq: Four times a day (QID) | INTRAVENOUS | Status: DC | PRN
  Filled 2012-11-09: qty 5

## 2012-11-09 MED ORDER — SODIUM CHLORIDE 0.9 % IJ SOLN: 3.0000 mL | Freq: Two times a day (BID) | INTRAMUSCULAR | Status: DC

## 2012-11-09 MED ORDER — LIDOCAINE HCL (CARDIAC) 20 MG/ML IV SOLN
INTRAVENOUS | Status: DC | PRN
  Administered 2012-11-09: 60 mg via INTRAVENOUS

## 2012-11-09 MED ORDER — FENTANYL CITRATE 0.05 MG/ML IJ SOLN
INTRAMUSCULAR | Status: DC | PRN
  Administered 2012-11-09 (×3): 50 ug via INTRAVENOUS
  Administered 2012-11-09: 100 ug via INTRAVENOUS

## 2012-11-09 MED ORDER — METHOCARBAMOL 500 MG PO TABS
500.0000 mg | ORAL_TABLET | Freq: Four times a day (QID) | ORAL | Status: DC | PRN
  Administered 2012-11-10: 500 mg via ORAL
  Filled 2012-11-09: qty 1

## 2012-11-09 MED ORDER — OXYCODONE HCL 5 MG PO TABS: 5.0000 mg | ORAL_TABLET | Freq: Once | ORAL | Status: DC | PRN

## 2012-11-09 MED ORDER — DIAZEPAM 5 MG PO TABS
10.0000 mg | ORAL_TABLET | ORAL | Status: AC
  Administered 2012-11-09: 10 mg via ORAL

## 2012-11-09 MED ORDER — BUPIVACAINE-EPINEPHRINE 0.25% -1:200000 IJ SOLN
INTRAMUSCULAR | Status: DC | PRN
  Administered 2012-11-09: 4 mL

## 2012-11-09 MED ORDER — CEFAZOLIN SODIUM 1-5 GM-% IV SOLN
1.0000 g | Freq: Three times a day (TID) | INTRAVENOUS | Status: AC
  Administered 2012-11-09 – 2012-11-10 (×2): 1 g via INTRAVENOUS
  Filled 2012-11-09 (×2): qty 50

## 2012-11-09 MED ORDER — BUPIVACAINE-EPINEPHRINE 0.25% -1:200000 IJ SOLN
INTRAMUSCULAR | Status: AC
  Filled 2012-11-09: qty 1

## 2012-11-09 MED ORDER — MORPHINE SULFATE 2 MG/ML IJ SOLN
1.0000 mg | INTRAMUSCULAR | Status: DC | PRN
  Administered 2012-11-09 (×2): 2 mg via INTRAVENOUS
  Administered 2012-11-10: 4 mg via INTRAVENOUS
  Filled 2012-11-09 (×2): qty 1
  Filled 2012-11-09: qty 2

## 2012-11-09 MED ORDER — DIAZEPAM 5 MG PO TABS
ORAL_TABLET | ORAL | Status: AC
  Filled 2012-11-09: qty 2

## 2012-11-09 MED ORDER — MIDAZOLAM HCL 5 MG/5ML IJ SOLN
INTRAMUSCULAR | Status: DC | PRN
  Administered 2012-11-09: 2 mg via INTRAVENOUS

## 2012-11-09 MED ORDER — PHENOL 1.4 % MT LIQD
1.0000 | OROMUCOSAL | Status: DC | PRN
  Administered 2012-11-09: 1 via OROMUCOSAL
  Filled 2012-11-09: qty 177

## 2012-11-09 MED ORDER — AMLODIPINE BESYLATE 5 MG PO TABS
5.0000 mg | ORAL_TABLET | Freq: Every day | ORAL | Status: DC
  Administered 2012-11-09: 5 mg via ORAL
  Filled 2012-11-09 (×2): qty 1

## 2012-11-09 MED ORDER — ONDANSETRON HCL 4 MG/2ML IJ SOLN: 4.0000 mg | INTRAMUSCULAR | Status: DC | PRN

## 2012-11-09 MED ORDER — PNEUMOCOCCAL VAC POLYVALENT 25 MCG/0.5ML IJ INJ
0.5000 mL | INJECTION | INTRAMUSCULAR | Status: AC
  Administered 2012-11-10: 0.5 mL via INTRAMUSCULAR
  Filled 2012-11-09: qty 0.5

## 2012-11-09 MED ORDER — LACTATED RINGERS IV SOLN
INTRAVENOUS | Status: DC
  Administered 2012-11-09: 1000 mL via INTRAVENOUS

## 2012-11-09 MED ORDER — MUPIROCIN 2 % EX OINT
TOPICAL_OINTMENT | Freq: Two times a day (BID) | CUTANEOUS | Status: DC
  Filled 2012-11-09 (×2): qty 22

## 2012-11-09 MED ORDER — 0.9 % SODIUM CHLORIDE (POUR BTL) OPTIME
TOPICAL | Status: DC | PRN
  Administered 2012-11-09: 1000 mL

## 2012-11-09 MED ORDER — SODIUM CHLORIDE 0.9 % IV SOLN
10.0000 mg | INTRAVENOUS | Status: DC | PRN
  Administered 2012-11-09: 10 ug/min via INTRAVENOUS

## 2012-11-09 MED ORDER — DEXAMETHASONE SODIUM PHOSPHATE 4 MG/ML IJ SOLN
4.0000 mg | Freq: Four times a day (QID) | INTRAMUSCULAR | Status: DC
  Filled 2012-11-09 (×7): qty 1

## 2012-11-09 MED ORDER — ACETAMINOPHEN 10 MG/ML IV SOLN: 1000.0000 mg | Freq: Once | INTRAVENOUS | Status: DC

## 2012-11-09 MED ORDER — HEMOSTATIC AGENTS (NO CHARGE) OPTIME
TOPICAL | Status: DC | PRN
  Administered 2012-11-09: 1 via TOPICAL

## 2012-11-09 MED ORDER — ACETAMINOPHEN 10 MG/ML IV SOLN
1000.0000 mg | Freq: Once | INTRAVENOUS | Status: AC
  Administered 2012-11-09: 1000 mg via INTRAVENOUS

## 2012-11-09 MED ORDER — HYDROMORPHONE HCL PF 1 MG/ML IJ SOLN: 0.2500 mg | INTRAMUSCULAR | Status: DC | PRN

## 2012-11-09 MED ORDER — THROMBIN 20000 UNITS EX SOLR
CUTANEOUS | Status: DC | PRN
  Administered 2012-11-09: 09:00:00 via TOPICAL

## 2012-11-09 MED ORDER — ONDANSETRON HCL 4 MG/2ML IJ SOLN
INTRAMUSCULAR | Status: DC | PRN
  Administered 2012-11-09: 4 mg via INTRAVENOUS

## 2012-11-09 MED ORDER — OXYCODONE HCL 5 MG PO TABS
10.0000 mg | ORAL_TABLET | ORAL | Status: DC | PRN
  Administered 2012-11-09: 10 mg via ORAL
  Filled 2012-11-09 (×3): qty 2

## 2012-11-09 MED ORDER — SODIUM CHLORIDE 0.9 % IV SOLN: 250.0000 mL | INTRAVENOUS | Status: DC

## 2012-11-09 MED ORDER — OXYCODONE HCL 5 MG/5ML PO SOLN: 5.0000 mg | Freq: Once | ORAL | Status: DC | PRN

## 2012-11-09 MED ORDER — NEOSTIGMINE METHYLSULFATE 1 MG/ML IJ SOLN
INTRAMUSCULAR | Status: DC | PRN
  Administered 2012-11-09: 3 mg via INTRAVENOUS

## 2012-11-09 MED ORDER — ONDANSETRON HCL 4 MG/2ML IJ SOLN: 4.0000 mg | Freq: Four times a day (QID) | INTRAMUSCULAR | Status: DC | PRN

## 2012-11-09 MED ORDER — MENTHOL 3 MG MT LOZG: 1.0000 | LOZENGE | OROMUCOSAL | Status: DC | PRN

## 2012-11-09 MED ORDER — GLYCOPYRROLATE 0.2 MG/ML IJ SOLN
INTRAMUSCULAR | Status: DC | PRN
  Administered 2012-11-09: 0.4 mg via INTRAVENOUS

## 2012-11-09 MED ORDER — THROMBIN 20000 UNITS EX SOLR
CUTANEOUS | Status: AC
  Filled 2012-11-09: qty 20000

## 2012-11-09 MED ORDER — DEXAMETHASONE SODIUM PHOSPHATE 10 MG/ML IJ SOLN
INTRAMUSCULAR | Status: AC
  Administered 2012-11-09: 10 mg via INTRAVENOUS
  Filled 2012-11-09: qty 1

## 2012-11-09 MED ORDER — ZOLPIDEM TARTRATE 5 MG PO TABS: 5.0000 mg | ORAL_TABLET | Freq: Every evening | ORAL | Status: DC | PRN

## 2012-11-09 MED ORDER — PROPOFOL 10 MG/ML IV BOLUS
INTRAVENOUS | Status: DC | PRN
  Administered 2012-11-09: 50 mg via INTRAVENOUS
  Administered 2012-11-09: 170 mg via INTRAVENOUS

## 2012-11-09 MED ORDER — SODIUM CHLORIDE 0.9 % IJ SOLN: 3.0000 mL | INTRAMUSCULAR | Status: DC | PRN

## 2012-11-09 MED ORDER — ROCURONIUM BROMIDE 100 MG/10ML IV SOLN
INTRAVENOUS | Status: DC | PRN
  Administered 2012-11-09: 50 mg via INTRAVENOUS

## 2012-11-09 MED ORDER — ALBUMIN HUMAN 5 % IV SOLN
INTRAVENOUS | Status: DC | PRN
  Administered 2012-11-09: 12:00:00 via INTRAVENOUS

## 2012-11-09 MED ORDER — LACTATED RINGERS IV SOLN
INTRAVENOUS | Status: DC | PRN
  Administered 2012-11-09 (×2): via INTRAVENOUS

## 2012-11-09 MED ORDER — ACETAMINOPHEN 10 MG/ML IV SOLN
INTRAVENOUS | Status: AC
  Filled 2012-11-09: qty 100

## 2012-11-09 MED ORDER — DEXAMETHASONE 4 MG PO TABS
4.0000 mg | ORAL_TABLET | Freq: Four times a day (QID) | ORAL | Status: DC
  Administered 2012-11-09 – 2012-11-10 (×3): 4 mg via ORAL
  Filled 2012-11-09 (×7): qty 1

## 2012-11-09 SURGICAL SUPPLY — 59 items
BIT DRILL SKYLINE 12MM (BIT) ×2 IMPLANT
BLADE SURG 15 STRL LF DISP TIS (BLADE) ×2 IMPLANT
BLADE SURG 15 STRL SS (BLADE) ×3
BLADE SURG ROTATE 9660 (MISCELLANEOUS) ×3 IMPLANT
BUR EGG ELITE 4.0 (BURR) IMPLANT
BUR MATCHSTICK NEURO 3.0 LAGG (BURR) IMPLANT
CANISTER SUCTION 2500CC (MISCELLANEOUS) ×3 IMPLANT
CLOSURE STERI-STRIP 1/4X4 (GAUZE/BANDAGES/DRESSINGS) ×3 IMPLANT
CLOTH BEACON ORANGE TIMEOUT ST (SAFETY) ×3 IMPLANT
CLSR STERI-STRIP ANTIMIC 1/2X4 (GAUZE/BANDAGES/DRESSINGS) ×3 IMPLANT
CORDS BIPOLAR (ELECTRODE) ×3 IMPLANT
COVER SURGICAL LIGHT HANDLE (MISCELLANEOUS) ×3 IMPLANT
CRADLE DONUT ADULT HEAD (MISCELLANEOUS) ×3 IMPLANT
DRAPE C-ARM 42X72 X-RAY (DRAPES) ×3 IMPLANT
DRAPE POUCH INSTRU U-SHP 10X18 (DRAPES) ×3 IMPLANT
DRAPE SURG 17X23 STRL (DRAPES) IMPLANT
DRAPE U-SHAPE 47X51 STRL (DRAPES) ×6 IMPLANT
DRILL BIT SKYLINE 12MM (BIT) ×3
DRSG MEPILEX BORDER 4X4 (GAUZE/BANDAGES/DRESSINGS) ×3 IMPLANT
DURAPREP 26ML APPLICATOR (WOUND CARE) ×3 IMPLANT
ELECT COATED BLADE 2.86 ST (ELECTRODE) ×3 IMPLANT
ELECT REM PT RETURN 9FT ADLT (ELECTROSURGICAL) ×3
ELECTRODE REM PT RTRN 9FT ADLT (ELECTROSURGICAL) ×2 IMPLANT
ENDOSKELETON LG TC 6VBR 8MM (Orthopedic Implant) ×9 IMPLANT
GLOVE BIOGEL PI IND STRL 8.5 (GLOVE) ×2 IMPLANT
GLOVE BIOGEL PI INDICATOR 8.5 (GLOVE) ×1
GLOVE ECLIPSE 8.5 STRL (GLOVE) ×3 IMPLANT
GOWN PREVENTION PLUS XXLARGE (GOWN DISPOSABLE) ×3 IMPLANT
GOWN STRL REIN XL XLG (GOWN DISPOSABLE) ×6 IMPLANT
KIT BASIN OR (CUSTOM PROCEDURE TRAY) ×3 IMPLANT
KIT ROOM TURNOVER OR (KITS) ×3 IMPLANT
MIX DBX 10CC 35% BONE (Bone Implant) ×3 IMPLANT
NEEDLE SPNL 18GX3.5 QUINCKE PK (NEEDLE) ×3 IMPLANT
NS IRRIG 1000ML POUR BTL (IV SOLUTION) ×3 IMPLANT
PACK ORTHO CERVICAL (CUSTOM PROCEDURE TRAY) ×3 IMPLANT
PACK UNIVERSAL I (CUSTOM PROCEDURE TRAY) ×3 IMPLANT
PAD ARMBOARD 7.5X6 YLW CONV (MISCELLANEOUS) ×6 IMPLANT
PATTIES SURGICAL .25X.25 (GAUZE/BANDAGES/DRESSINGS) ×3 IMPLANT
PRO DISC C RETAINER PIN 14MM ×6 IMPLANT
SCREW SKYLINE 14MM SD-VA (Screw) ×21 IMPLANT
SCREW SKYLINE VAR OS 14MM (Screw) ×3 IMPLANT
SKYLINE TEMP FIXATION PIN ×3 IMPLANT
SPONGE INTESTINAL PEANUT (DISPOSABLE) ×6 IMPLANT
SPONGE LAP 4X18 X RAY DECT (DISPOSABLE) IMPLANT
SPONGE SURGIFOAM ABS GEL 100 (HEMOSTASIS) ×3 IMPLANT
SURGIFLO TRUKIT (HEMOSTASIS) IMPLANT
SUT MNCRL AB 3-0 PS2 18 (SUTURE) ×3 IMPLANT
SUT SILK 2 0 (SUTURE)
SUT SILK 2-0 18XBRD TIE 12 (SUTURE) IMPLANT
SUT VIC AB 2-0 CT1 18 (SUTURE) ×3 IMPLANT
SYR BULB IRRIGATION 50ML (SYRINGE) ×3 IMPLANT
SYR CONTROL 10ML LL (SYRINGE) ×3 IMPLANT
TAPE CLOTH 4X10 WHT NS (GAUZE/BANDAGES/DRESSINGS) ×3 IMPLANT
TAPE UMBILICAL COTTON 1/8X30 (MISCELLANEOUS) ×3 IMPLANT
TOWEL OR 17X24 6PK STRL BLUE (TOWEL DISPOSABLE) ×3 IMPLANT
TOWEL OR 17X26 10 PK STRL BLUE (TOWEL DISPOSABLE) ×3 IMPLANT
TRAY FOLEY CATH 14FR (SET/KITS/TRAYS/PACK) ×3 IMPLANT
WATER STERILE IRR 1000ML POUR (IV SOLUTION) IMPLANT
skyline 3-level 57mm plate ×3 IMPLANT

## 2012-11-09 NOTE — H&P (Signed)
No change in clinical exam H+P reviewed  

## 2012-11-09 NOTE — Transfer of Care (Signed)
Immediate Anesthesia Transfer of Care Note  Patient: Albert Mcdonald  Procedure(s) Performed: Procedure(s) with comments: ANTERIOR CERVICAL DECOMPRESSION/DISCECTOMY FUSION 3 LEVELS C4-C7  - ACDF C4-C7   Patient Location: PACU  Anesthesia Type:General  Level of Consciousness: awake, alert  and oriented  Airway & Oxygen Therapy: Patient Spontanous Breathing and Patient connected to face mask oxygen  Post-op Assessment: Report given to PACU RN, Post -op Vital signs reviewed and stable and Patient moving all extremities  Post vital signs: Reviewed and stable  Complications: No apparent anesthesia complications

## 2012-11-09 NOTE — Anesthesia Postprocedure Evaluation (Signed)
Anesthesia Post Note  Patient: Albert Mcdonald  Procedure(s) Performed: Procedure(s): ANTERIOR CERVICAL DECOMPRESSION/DISCECTOMY FUSION 3 LEVELS C4-C7   Anesthesia type: General  Patient location: PACU  Post pain: Pain level controlled and Adequate analgesia  Post assessment: Post-op Vital signs reviewed, Patient's Cardiovascular Status Stable, Respiratory Function Stable, Patent Airway and Pain level controlled  Last Vitals:  Filed Vitals:   11/09/12 1300  BP:   Pulse: 110  Temp:   Resp: 18    Post vital signs: Reviewed and stable  Level of consciousness: awake, alert  and oriented  Complications: No apparent anesthesia complications

## 2012-11-09 NOTE — Preoperative (Signed)
Beta Blockers   Reason not to administer Beta Blockers:Not Applicable 

## 2012-11-09 NOTE — Op Note (Signed)
[name], [name] NO.:  [id number]  MEDICAL RECORD NO.:  [id number]  LOCATION:  5N30C                        FACILITY:  [facility]  PHYSICIAN:  Alvy Beal, MD    DATE OF BIRTH:  August 17, 1971  DATE OF PROCEDURE:  11/09/2012 DATE OF DISCHARGE:                              OPERATIVE REPORT   PREOPERATIVE DIAGNOSIS:  Degenerative cervical spondylitic radiculopathy.  POSTOPERATIVE DIAGNOSIS:  Degenerative cervical spondylitic radiculopathy.  PROCEDURE:  Anterior cervical diskectomy and fusion at C4-C5, C5-C6, and C6-C7.  COMPLICATIONS:  None.  CONDITION:  Stable.  INSTRUMENTATION SYSTEM USED:  Titian titanium size 8, large lordotic intervertebral cages packed with DBX mix with anterior cervical DePuy Skyline plate.  HISTORY:  This is a very pleasant gentleman who has been having long- standing significant neck and radicular arm pain for several years now. Recently, he was diagnosed with cervical spondylitic radiculopathy. Despite appropriate conservative management, his symptoms progressed. As a result, we elected to take him to the operating room for the aforementioned procedure.  All appropriate risks, benefits, and alternatives to surgery were discussed, and preoperative medical clearance was obtained from his primary care physician.  OPERATIVE NOTE:  The patient was brought to the operating room, placed supine on the operating table.  After successful induction of general anesthesia and TEDs, SCDs and the Foley were inserted.  Roller towels were placed between the shoulder blades.  The arms were secured at the side.  The anterior cervical spine was prepped and draped in the standard fashion.  Time-out was then done confirming patient, procedure, and all other pertinent important data.  Once this was completed, a time- out was done confirming the patient, procedure, and all other pertinent important data.  Following this, I then infiltrated the  proposed incision site with 0.25% Marcaine.  A longitudinal incision was made along the sternocleidomastoid.  Sharp dissection was carried out down to the deep fascia.  The deep fascia was sharply incised until I could visualize the platysma.  The platysma was sharply incised.  I then identified the sternocleidomastoid.  I began dissecting along the medial border of the this.  I then identified the omohyoid and transected it.  I then continued my dissection sharply through the deep cervical and prevertebral fascia.  I identified the crossing vascular structure, tied these superior thyroid vein and sacrificed it.  This allowed direct visualization through a C4-C5, C5- C6, and C6-C7 disk spaces.  The needle was placed into the C4-C5 disk space.  I then took an intraoperative x-ray.  Fluoro confirmed that I was at the appropriate level.  Once this was completed, I then mobilized the longus coli muscles from the midbody of C4 through the midbody of C7.  This was done using bipolar electrocautery.  Once this was completed, I had the approach completed.  I then placed self-retaining retractors underneath the longus coli muscle and then deflated the endotracheal cuff, expanded the retractor, and reinflated the endotracheal cuff.  Distraction pins were placed into the bodies of C6 and C7, and then I incised the disk space.  Using pituitary rongeurs, curettes, and Kerrison rongeurs, I removed all of the disk material.  I then distracted the intervertebral  space, and then maintained the distraction with distraction pins.  I then worked posteriorly with a micro nerve hooks and small curettes until I identified the posterior longitudinal ligament.  I then used fine nerve hook to develop a plane underneath the posterior longitudinal ligament and explored this with a 1 mm Kerrison to remove the entire posterior longitudinal ligament.  I could then remove the calcified hard disk osteophyte that had  formed. Once this was done, I then rasped the endplates, measured with trial devices and placed the 8 large lordotic Titan titanium spacer, packed with DBX mix.  I then repositioned my distraction pins into the bodies of C5 and C6, distracted the space using the same technique just used. I performed the same diskectomy at C5-C6.  Again, I removed the osteophyte.  At this time,  I saw 3 smaller more soft disk herniation in the posterior lateral gutter.  Once the space was trialed and rasped, I placed the same-size spacer packed with DBX mixture.  I then repositioned the screws again into the bodies of C4-C5 and using the same technique performed the same diskectomy again.  At this point, with all 3 interbody spacers placed, I then contoured an anterior cervical plate and affixed it to the bodies of C4, C5, C6, and C7 with self- drilling 14 mm screws.  All screws were properly torqued down to the appropriate depth.  I confirmed satisfactory position with x-ray, and then I locked the screws in place according to manufacturer's standard. Once all screws were locked, I then removed the retractors, irrigated the wound copiously with normal saline and made sure I had hemostasis using bipolar electrocautery.  I then closed the platysma with interrupted 2-0 Vicryl sutures and 3-0 Monocryl for the skin.  Steri- Strips and dry dressing were applied.  The patient was extubated, transferred to PACU without incident.  At the end of the case, all needle and sponge counts were correct.     Alvy Beal, MD     DDB/MEDQ  D:  11/09/2012  T:  11/09/2012  Job:  [id number]

## 2012-11-09 NOTE — Brief Op Note (Signed)
11/09/2012  12:05 PM  PATIENT:  Albert Mcdonald  42 y.o. male  PRE-OPERATIVE DIAGNOSIS:  CERVICAL SPONDYLOTIC DISEASE   POST-OPERATIVE DIAGNOSIS:  CERVICAL SPONDYLOTIC DISEASE   PROCEDURE:  Procedure(s) with comments: ANTERIOR CERVICAL DECOMPRESSION/DISCECTOMY FUSION 3 LEVELS C4-C7  - ACDF C4-C7   SURGEON:  Surgeon(s) and Role:    * Venita Lick, MD - Primary  PHYSICIAN ASSISTANT:   ASSISTANTS: none   ANESTHESIA:   general  EBL:  Total I/O In: 1000 [I.V.:1000] Out: 350 [Urine:350]  BLOOD ADMINISTERED:none  DRAINS: none   LOCAL MEDICATIONS USED:  MARCAINE     SPECIMEN:  No Specimen  DISPOSITION OF SPECIMEN:  N/A  COUNTS:  YES  TOURNIQUET:  * No tourniquets in log *  DICTATION: .Other Dictation: Dictation Number [id number]  PLAN OF CARE: Admit for overnight observation  PATIENT DISPOSITION:  PACU - hemodynamically stable.

## 2012-11-09 NOTE — Anesthesia Procedure Notes (Signed)
Procedure Name: Intubation Date/Time: 11/09/2012 7:57 AM Performed by: Quentin Ore Pre-anesthesia Checklist: Patient identified, Emergency Drugs available, Suction available, Patient being monitored and Timeout performed Patient Re-evaluated:Patient Re-evaluated prior to inductionOxygen Delivery Method: Circle system utilized Preoxygenation: Pre-oxygenation with 100% oxygen Intubation Type: IV induction Ventilation: Mask ventilation without difficulty and Oral airway inserted - appropriate to patient size Laryngoscope Size: Mac and 4 Grade View: Grade I Tube type: Oral Tube size: 7.5 mm Number of attempts: 1 Airway Equipment and Method: Stylet Placement Confirmation: ETT inserted through vocal cords under direct vision,  positive ETCO2 and breath sounds checked- equal and bilateral Secured at: 23 cm Tube secured with: Tape Dental Injury: Teeth and Oropharynx as per pre-operative assessment

## 2012-11-10 MED ORDER — POLYETHYLENE GLYCOL 3350 17 GM/SCOOP PO POWD: 17.0000 g | Freq: Every day | ORAL | Status: DC

## 2012-11-10 MED ORDER — DOCUSATE SODIUM 100 MG PO CAPS: 100.0000 mg | ORAL_CAPSULE | Freq: Three times a day (TID) | ORAL | Status: DC | PRN

## 2012-11-10 MED ORDER — METHOCARBAMOL 500 MG PO TABS
500.0000 mg | ORAL_TABLET | Freq: Three times a day (TID) | ORAL | Status: DC | PRN
Start: 2012-11-10 — End: 2014-03-09

## 2012-11-10 MED ORDER — OXYCODONE-ACETAMINOPHEN 10-325 MG PO TABS: 1.0000 | ORAL_TABLET | ORAL | Status: DC | PRN

## 2012-11-10 MED ORDER — ONDANSETRON HCL 4 MG PO TABS: 4.0000 mg | ORAL_TABLET | Freq: Three times a day (TID) | ORAL | Status: DC | PRN

## 2012-11-10 NOTE — Discharge Summary (Signed)
Patient ID: Albert Mcdonald MRN: [id number] DOB/AGE: 1971/09/16 42 y.o.  Admit date: 11/09/2012 Discharge date: 11/10/2012  Admission Diagnoses:  Principal Problem:   Cervical spondylosis   Discharge Diagnoses:  Principal Problem:   Cervical spondylosis  status post Procedure(s): ANTERIOR CERVICAL DECOMPRESSION/DISCECTOMY FUSION 3 LEVELS C4-C7   Past Medical History  Diagnosis Date  . OCD (obsessive compulsive disorder)   . Kleine-Levin syndrome   . Hyperlipidemia   . ALS (amyotrophic lateral sclerosis)     patient denied on 11/01/12 -- has KLS not ALS  . Diabetes mellitus   . Hypertension   . Restless leg     Surgeries: Procedure(s): ANTERIOR CERVICAL DECOMPRESSION/DISCECTOMY FUSION 3 LEVELS C4-C7  on 11/09/2012   Consultants:   none  Discharged Condition: Improved  Hospital Course: KYLO GAVIN is an 42 y.o. male who was admitted 11/09/2012 for operative treatment of Cervical spondylosis. Patient failed conservative treatments (please see the history and physical for the specifics) and had severe unremitting pain that affects sleep, daily activities and work/hobbies. After pre-op clearance, the patient was taken to the operating room on 11/09/2012 and underwent  Procedure(s): ANTERIOR CERVICAL DECOMPRESSION/DISCECTOMY FUSION 3 LEVELS C4-C7 .    Patient was given perioperative antibiotics: Anti-infectives   Start     Dose/Rate Route Frequency Ordered Stop   11/09/12 1600  ceFAZolin (ANCEF) IVPB 1 g/50 mL premix     1 g 100 mL/hr over 30 Minutes Intravenous Every 8 hours 11/09/12 1500 11/10/12 0103   11/08/12 1427  ceFAZolin (ANCEF) IVPB 2 g/50 mL premix     2 g 100 mL/hr over 30 Minutes Intravenous 30 min pre-op 11/08/12 1427 11/09/12 0800       Patient was given sequential compression devices and early ambulation to prevent DVT.   Patient benefited maximally from hospital stay and there were no complications. At the time of discharge, the patient was  urinating/moving their bowels without difficulty, tolerating a regular diet, pain is controlled with oral pain medications and they have been cleared by PT/OT.   Recent vital signs: Patient Vitals for the past 24 hrs:  BP Temp Pulse Resp SpO2  11/10/12 0539 161/97 mmHg 97.7 F (36.5 C) 100 18 100 %  11/10/12 0227 169/86 mmHg 98.1 F (36.7 C) 100 18 99 %  11/09/12 2052 151/89 mmHg 98.2 F (36.8 C) 105 18 100 %  11/09/12 1459 145/77 mmHg 98.2 F (36.8 C) 104 18 100 %  11/09/12 1430 142/86 mmHg 98.5 F (36.9 C) 107 19 100 %  11/09/12 1415 142/72 mmHg - 106 17 98 %  11/09/12 1400 141/68 mmHg - 110 22 98 %  11/09/12 1345 117/55 mmHg - 111 23 97 %  11/09/12 1330 131/61 mmHg - 117 24 99 %  11/09/12 1315 124/57 mmHg - 111 24 98 %  11/09/12 1300 132/59 mmHg - 110 18 97 %  11/09/12 1245 121/51 mmHg - 103 16 99 %  11/09/12 1230 135/62 mmHg 98.9 F (37.2 C) 103 18 98 %     Recent laboratory studies: No results found for this basename: WBC, HGB, HCT, PLT, NA, K, CL, CO2, BUN, CREATININE, GLUCOSE, PT, INR, CALCIUM, 2,  in the last 72 hours   Discharge Medications:     Medication List    STOP taking these medications       HYDROcodone-acetaminophen 10-325 MG per tablet  Commonly known as:  NORCO      TAKE these medications  amLODipine 5 MG tablet  Commonly known as:  NORVASC  Take 5 mg by mouth daily.     docusate sodium 100 MG capsule  Commonly known as:  COLACE  Take 1 capsule (100 mg total) by mouth 3 (three) times daily as needed for constipation.     methocarbamol 500 MG tablet  Commonly known as:  ROBAXIN  Take 1 tablet (500 mg total) by mouth 3 (three) times daily as needed.     ondansetron 4 MG tablet  Commonly known as:  ZOFRAN  Take 1 tablet (4 mg total) by mouth every 8 (eight) hours as needed for nausea.     oxyCODONE-acetaminophen 10-325 MG per tablet  Commonly known as:  PERCOCET  Take 1 tablet by mouth every 4 (four) hours as needed for pain.      polyethylene glycol powder powder  Commonly known as:  GLYCOLAX  Take 17 g by mouth daily.        Diagnostic Studies: Dg Chest 2 View  11/01/2012  *RADIOLOGY REPORT*  Clinical Data: Preop for cervical spine fusion  CHEST - 2 VIEW  Comparison: Chest x-ray of 11/04/2009  Findings: No active infiltrate or effusion is seen.  Mediastinal contours appear stable.  The heart is within upper limits of normal in size.  There are mild degenerative changes in the lower thoracic spine.  IMPRESSION: Stable chest x-ray.  No active lung disease.   Original Report Authenticated By: Dwyane Dee, M.D.    Dg Cervical Spine 2 Or 3 Views  11/09/2012  *RADIOLOGY REPORT*  Clinical Data: Status post ACDF at C4-C7.  CERVICAL SPINE - 2-3 VIEW  Comparison: 11/09/2012 intraoperative films.  Findings: Three views of the cervical spine demonstrate postoperative changes of a ACDF from C4-C7 with interbody graft in place at C4-C5, C5-C6 and C6-C7 interspaces.  Alignment appears anatomic.  Small amount of gas in the prevertebral soft tissues is expected given the recent surgery.  Endotracheal tube has been removed.  IMPRESSION: 1.  Postoperative changes of ACDF from C4-C7, as above.   Original Report Authenticated By: Trudie Reed, M.D.    Dg Cervical Spine 2-3 Views  11/09/2012  *RADIOLOGY REPORT*  Clinical Data: ACDF at C4-C7  DG C-ARM 1-60 MIN,CERVICAL SPINE - 2-3 VIEW  Technique: Multiple intraoperative views of the cervical spine are submitted.  Comparison:  Preoperative radiograph 11/09/2012.  Findings: Three intraoperative fluoroscopic views of the cervical spine demonstrate interval placement of an anterior plate screw fixation device extending from C4-C7 with interbody grafts at the C4-C5, C5-C6 and C6-C7 interspaces.  Alignment is anatomic.  The patient is intubated.  IMPRESSION: 1.  Intraoperative documentation of ACDF at C4-C7, as above.   Original Report Authenticated By: Trudie Reed, M.D.    X-ray Cervical Spine Ap  And Lateral  11/09/2012  *RADIOLOGY REPORT*  Clinical Data: Preoperative cervical spine radiographs for cervical fusion.  CERVICAL SPINE - 2-3 VIEW  Comparison: Cervical spine radiographs performed 11/01/2012, and MRI of the cervical spine performed 03/03/2012  Findings: There is no evidence of fracture or subluxation. Vertebral bodies demonstrate normal height and alignment. Scattered anterior and posterior disc osteophyte complexes are noted along the cervical spine.  Intervertebral disc spaces are preserved.  Prevertebral soft tissues are within normal limits. The provided odontoid view demonstrates no significant abnormality.  The visualized lung apices are clear.  IMPRESSION:  1.  No evidence of fracture or subluxation along the cervical spine. 2.  Mild degenerative change noted along the cervical spine.  Original Report Authenticated By: Tonia Ghent, M.D.    Dg Cervical Spine Complete  11/01/2012  *RADIOLOGY REPORT*  Clinical Data: Preop for cervical spine fusion.  CERVICAL SPINE - COMPLETE 4+ VIEW  Comparison: MRI of the cervical spine 03/03/2012.  Findings: The cervical spine is visualized from skull base through the cervicothoracic junction.  The prevertebral soft tissues are normal.  Moderate degenerative changes are present throughout cervical spine.  Endplate osteophyte formation and uncovertebral disease is present.  The lung apices are clear.  IMPRESSION:  1.  Multilevel spondylosis of the cervical spine. 2.  No acute abnormality.   Original Report Authenticated By: Marin Roberts, M.D.    Dg C-arm 1-60 Min  11/09/2012  *RADIOLOGY REPORT*  Clinical Data: ACDF at C4-C7  DG C-ARM 1-60 MIN,CERVICAL SPINE - 2-3 VIEW  Technique: Multiple intraoperative views of the cervical spine are submitted.  Comparison:  Preoperative radiograph 11/09/2012.  Findings: Three intraoperative fluoroscopic views of the cervical spine demonstrate interval placement of an anterior plate screw fixation device  extending from C4-C7 with interbody grafts at the C4-C5, C5-C6 and C6-C7 interspaces.  Alignment is anatomic.  The patient is intubated.  IMPRESSION: 1.  Intraoperative documentation of ACDF at C4-C7, as above.   Original Report Authenticated By: Trudie Reed, M.D.           Follow-up Information   Schedule an appointment as soon as possible for a visit in 2 weeks to follow up. (As needed if symptoms worsen)       Discharge Plan:  discharge to home  Disposition: stable - ok for d/c to home  F/u 2 weeks    Signed: Venita Lick D for Dr. Venita Lick [facility] [phone] 11/10/2012, 10:09 AM

## 2012-11-10 NOTE — Progress Notes (Signed)
Utilization review completed. Arthor Gorter, RN, BSN. 

## 2012-11-10 NOTE — Progress Notes (Signed)
    Subjective: Procedure(s): ANTERIOR CERVICAL DECOMPRESSION/DISCECTOMY FUSION 3 LEVELS C4-C7  1 Day Post-Op  Patient reports pain as 2 on 0-10 scale.  Reports decreased arm pain minimal incisional neck pain   Positive void Negative bowel movement Positive flatus Negative chest pain or shortness of breath  Objective: Vital signs in last 24 hours: Temp:  [97.7 F (36.5 C)-98.9 F (37.2 C)] 97.7 F (36.5 C) (02/14 0539) Pulse Rate:  [100-117] 100 (02/14 0539) Resp:  [16-24] 18 (02/14 0539) BP: (117-169)/(51-97) 161/97 mmHg (02/14 0539) SpO2:  [97 %-100 %] 100 % (02/14 0539)  Intake/Output from previous day: 02/13 0701 - 02/14 0700 In: 3730 [P.O.:1480; I.V.:2000; IV Piggyback:250] Out: 2400 [Urine:2350; Blood:50]  Labs: No results found for this basename: WBC, RBC, HCT, PLT,  in the last 72 hours No results found for this basename: NA, K, CL, CO2, BUN, CREATININE, GLUCOSE, CALCIUM,  in the last 72 hours No results found for this basename: LABPT, INR,  in the last 72 hours  Physical Exam: Neurologically intact ABD soft Neurovascular intact Sensation intact distally Intact pulses distally Incision: dressing C/D/I and no drainage No cellulitis present Compartment soft  Assessment/Plan: Patient stable  xrays satisfactory Mobilization with physical therapy Encourage incentive spirometry Continue care  Advance diet Up with therapy D/c to home  Venita Lick, MD [facility] [phone]

## 2012-11-10 NOTE — Evaluation (Signed)
Occupational Therapy Evaluation and Discharge Patient Details Name: Albert Mcdonald MRN: [id number] DOB: 11-25-1970 Today's Date: 11/10/2012 Time: 4098-1191 OT Time Calculation (min): 35 min  OT Assessment / Plan / Recommendation Clinical Impression  This 42 yo s/p anterior cervical surgery presents to acute OT with all education complete. OT will sign off.    OT Assessment  Patient does not need any further OT services    Follow Up Recommendations  No OT follow up       Equipment Recommendations  None recommended by OT          Precautions / Restrictions Precautions Precautions: Cervical Required Braces or Orthoses: Cervical Brace Cervical Brace: Hard collar;Applied in sitting position (per pt per MD he can have it off for showering and sleeping) Restrictions Weight Bearing Restrictions: No   Pertinent Vitals/Pain 8/10 neck (RN brought in med)    ADL  Equipment Used:  (cervical brace) Transfers/Ambulation Related to ADLs: Independent ADL Comments: Pt is I/Mod I for all BADLs. Went over with pt and his mom how to change out the pads of collar and adjust collar. Handout given for post surgical neck.        Visit Information  Last OT Received On: 11/10/12 Assistance Needed: +1    Subjective Data  Subjective: I am having trouble swallowing (explained to him that this may be the case due to breathing tube during sx and swelling from surgery in his neck)   Prior Functioning     Home Living Lives With: Family Available Help at Discharge: Family;Available 24 hours/day Type of Home: Apartment Home Access: Level entry Home Layout: One level Bathroom Shower/Tub: Tub/shower unit;Curtain Firefighter: Standard Home Adaptive Equipment: None Prior Function Level of Independence: Independent Able to Take Stairs?: Yes Driving: No Vocation: On disability Communication Communication: No difficulties Dominant Hand: Right            Cognition   Cognition Overall Cognitive Status: Appears within functional limits for tasks assessed/performed Arousal/Alertness: Awake/alert Orientation Level: Appears intact for tasks assessed Behavior During Session: [place] for tasks performed    Extremity/Trunk Assessment Right Upper Extremity Assessment RUE ROM/Strength/Tone: Within functional levels (made him aware not to raise arms overhead) Left Upper Extremity Assessment LUE ROM/Strength/Tone: Within functional levels (made him aware not to raise arms overhead)     Mobility Bed Mobility Bed Mobility: Rolling Right;Right Sidelying to Sit;Sit to Sidelying Right Rolling Right: 6: Modified independent (Device/Increase time) Right Sidelying to Sit: 6: Modified independent (Device/Increase time) Sit to Sidelying Right: 6: Modified independent (Device/Increase time) Transfers Transfers: Sit to Stand;Stand to Sit Sit to Stand: 7: Independent Stand to Sit: 7: Independent Details for Transfer Assistance: Pt walked the entire unit without AD without issues           End of Session OT - End of Session Equipment Utilized During Treatment: Cervical collar Activity Tolerance: Patient tolerated treatment well Patient left: in chair;with call bell/phone within reach;with family/visitor present       Evette Georges [phone] 11/10/2012, 9:34 AM

## 2012-11-13 ENCOUNTER — Encounter (HOSPITAL_COMMUNITY): Payer: Self-pay | Admitting: Orthopedic Surgery

## 2012-11-20 ENCOUNTER — Encounter (HOSPITAL_COMMUNITY): Payer: Self-pay | Admitting: *Deleted

## 2012-11-20 ENCOUNTER — Emergency Department (HOSPITAL_COMMUNITY)
Admission: EM | Admit: 2012-11-20 | Discharge: 2012-11-20 | Disposition: A | Discharge status: Home / Self Care | Payer: Medicaid Other | Attending: Emergency Medicine | Admitting: Emergency Medicine

## 2012-11-20 DIAGNOSIS — Z8659 Personal history of other mental and behavioral disorders: Secondary | ICD-10-CM | POA: Insufficient documentation

## 2012-11-20 DIAGNOSIS — M542 Cervicalgia: Secondary | ICD-10-CM | POA: Insufficient documentation

## 2012-11-20 DIAGNOSIS — E119 Type 2 diabetes mellitus without complications: Secondary | ICD-10-CM | POA: Insufficient documentation

## 2012-11-20 DIAGNOSIS — Z8639 Personal history of other endocrine, nutritional and metabolic disease: Secondary | ICD-10-CM | POA: Insufficient documentation

## 2012-11-20 DIAGNOSIS — Z8669 Personal history of other diseases of the nervous system and sense organs: Secondary | ICD-10-CM | POA: Insufficient documentation

## 2012-11-20 DIAGNOSIS — L989 Disorder of the skin and subcutaneous tissue, unspecified: Secondary | ICD-10-CM | POA: Insufficient documentation

## 2012-11-20 DIAGNOSIS — G2581 Restless legs syndrome: Secondary | ICD-10-CM | POA: Insufficient documentation

## 2012-11-20 DIAGNOSIS — G8918 Other acute postprocedural pain: Secondary | ICD-10-CM | POA: Insufficient documentation

## 2012-11-20 DIAGNOSIS — Z862 Personal history of diseases of the blood and blood-forming organs and certain disorders involving the immune mechanism: Secondary | ICD-10-CM | POA: Insufficient documentation

## 2012-11-20 DIAGNOSIS — I10 Essential (primary) hypertension: Secondary | ICD-10-CM | POA: Insufficient documentation

## 2012-11-20 DIAGNOSIS — Z79899 Other long term (current) drug therapy: Secondary | ICD-10-CM | POA: Insufficient documentation

## 2012-11-20 NOTE — ED Provider Notes (Signed)
History    This chart was scribed for Albert Lennert, MD by Charolett Bumpers, ED Scribe. The patient was seen in room APA03/APA03. Patient's care was started at 2145.   CSN: [id number]  Arrival date & time 11/20/12  2020   First MD Initiated Contact with Patient 11/20/12 2145      Chief Complaint  Patient presents with  . Post-op Problem   Albert Mcdonald is a 42 y.o. male who presents to the Emergency Department complaining of post op neck tenderness with possible infection of his wound on the left anterior neck. He states that he had surgery on 11/09/12 by Dr. Shon Baton at [facility]. He states that today was the first day he took of the c-collar to clean the wound and was concerned it may be infected.    Patient is a 42 y.o. male presenting with wound check. The history is provided by the patient. No language interpreter was used.  Wound Check This is a new problem. The current episode started more than 1 week ago. The problem occurs constantly. The problem has been gradually improving. Pertinent negatives include no chest pain, no abdominal pain and no headaches. Nothing aggravates the symptoms. Nothing relieves the symptoms.    Past Medical History  Diagnosis Date  . OCD (obsessive compulsive disorder)   . Kleine-Levin syndrome   . Hyperlipidemia   . ALS (amyotrophic lateral sclerosis)     patient denied on 11/01/12 -- has KLS not ALS  . Diabetes mellitus   . Hypertension   . Restless leg     Past Surgical History  Procedure Laterality Date  . Anterior cervical decomp/discectomy fusion  11/09/2012    Procedure: ANTERIOR CERVICAL DECOMPRESSION/DISCECTOMY FUSION 3 LEVELS C4-C7 ;  Surgeon: Venita Lick, MD;  Location: [facility] OR;  Service: Orthopedics;;  ACDF C4-C7     History reviewed. No pertinent family history.  History  Substance Use Topics  . Smoking status: Never Smoker   . Smokeless tobacco: Not on file  . Alcohol Use: No      Review of Systems   Constitutional: Negative for fatigue.  HENT: Positive for neck pain. Negative for congestion, sinus pressure and ear discharge.   Eyes: Negative for discharge.  Respiratory: Negative for cough.   Cardiovascular: Negative for chest pain.  Gastrointestinal: Negative for abdominal pain and diarrhea.  Genitourinary: Negative for frequency and hematuria.  Musculoskeletal: Negative for back pain.  Skin: Positive for wound. Negative for rash.  Neurological: Negative for seizures and headaches.  Psychiatric/Behavioral: Negative for hallucinations.  All other systems reviewed and are negative.    Allergies  Ace inhibitors  Home Medications   Current Outpatient Rx  Name  Route  Sig  Dispense  Refill  . amLODipine (NORVASC) 5 MG tablet   Oral   Take 5 mg by mouth every morning.          . docusate sodium (COLACE) 100 MG capsule   Oral   Take 1 capsule (100 mg total) by mouth 3 (three) times daily as needed for constipation.   30 capsule   0   . methocarbamol (ROBAXIN) 500 MG tablet   Oral   Take 1 tablet (500 mg total) by mouth 3 (three) times daily as needed.   60 tablet   0   . olmesartan-hydrochlorothiazide (BENICAR HCT) 40-12.5 MG per tablet   Oral   Take 1 tablet by mouth every morning.         . ondansetron (ZOFRAN)  4 MG tablet   Oral   Take 1 tablet (4 mg total) by mouth every 8 (eight) hours as needed for nausea.   20 tablet   0   . oxyCODONE-acetaminophen (PERCOCET) 10-325 MG per tablet   Oral   Take 1 tablet by mouth every 4 (four) hours as needed for pain.   60 tablet   0   . polyethylene glycol powder (GLYCOLAX) powder   Oral   Take 17 g by mouth daily.   255 g   1   . rOPINIRole (REQUIP) 0.5 MG tablet   Oral   Take 0.5 mg by mouth at bedtime.           BP 94/55  Pulse 98  Temp(Src) 98.5 F (36.9 C) (Oral)  Resp 18  Ht 6\' 1"  (1.854 m)  Wt 240 lb (108.863 kg)  BMI 31.67 kg/m2  SpO2 98%  Physical Exam  Constitutional: He is oriented  to person, place, and time. He appears well-developed.  HENT:  Head: Normocephalic.  Eyes: Conjunctivae are normal.  Neck: No tracheal deviation present.  Healing incision to the left anterior neck. No evidence of infection.   Cardiovascular:  No murmur heard. Musculoskeletal: Normal range of motion.  Neurological: He is oriented to person, place, and time.  Skin: Skin is warm.  Psychiatric: He has a normal mood and affect.    ED Course  Procedures (including critical care time)  DIAGNOSTIC STUDIES: Oxygen Saturation is 98% on room air, normal by my interpretation.    COORDINATION OF CARE:  21:50-Discussed planned course of treatment with the patient including d/c home, who is agreeable at this time.    Labs Reviewed - No data to display No results found.   No diagnosis found.    MDM      The chart was scribed for me under my direct supervision.  I personally performed the history, physical, and medical decision making and all procedures in the evaluation of this patient.Albert Lennert, MD 11/20/12 2155

## 2012-11-20 NOTE — ED Notes (Signed)
Pt to ER w/ healing incision to the left neck, no signs of infection noted.

## 2012-11-20 NOTE — ED Notes (Signed)
Pt had c spine surgery 2/13 at [facility],  Pt concerned that incision may be getting infected.  Wearing a c collar.  Pt also  Wearing socks and no shoes.

## 2013-06-01 ENCOUNTER — Ambulatory Visit: Payer: Self-pay | Admitting: Family Medicine

## 2013-06-22 ENCOUNTER — Ambulatory Visit: Payer: Self-pay | Admitting: Family Medicine

## 2013-06-26 ENCOUNTER — Encounter: Payer: Self-pay | Admitting: Family Medicine

## 2013-06-26 ENCOUNTER — Ambulatory Visit (INDEPENDENT_AMBULATORY_CARE_PROVIDER_SITE_OTHER): Payer: Medicaid Other | Admitting: Family Medicine

## 2013-06-26 VITALS — BP 160/104 | HR 76 | Temp 97.3°F | Ht 73.0 in | Wt 238.4 lb

## 2013-06-26 DIAGNOSIS — E119 Type 2 diabetes mellitus without complications: Secondary | ICD-10-CM | POA: Insufficient documentation

## 2013-06-26 DIAGNOSIS — E118 Type 2 diabetes mellitus with unspecified complications: Secondary | ICD-10-CM

## 2013-06-26 DIAGNOSIS — E782 Mixed hyperlipidemia: Secondary | ICD-10-CM | POA: Insufficient documentation

## 2013-06-26 DIAGNOSIS — F429 Obsessive-compulsive disorder, unspecified: Secondary | ICD-10-CM

## 2013-06-26 DIAGNOSIS — Z8489 Family history of other specified conditions: Secondary | ICD-10-CM | POA: Insufficient documentation

## 2013-06-26 DIAGNOSIS — G4713 Recurrent hypersomnia: Secondary | ICD-10-CM

## 2013-06-26 DIAGNOSIS — E785 Hyperlipidemia, unspecified: Secondary | ICD-10-CM

## 2013-06-26 DIAGNOSIS — M4712 Other spondylosis with myelopathy, cervical region: Secondary | ICD-10-CM

## 2013-06-26 DIAGNOSIS — G2581 Restless legs syndrome: Secondary | ICD-10-CM

## 2013-06-26 DIAGNOSIS — I10 Essential (primary) hypertension: Secondary | ICD-10-CM

## 2013-06-26 LAB — POCT UA - MICROALBUMIN: Microalbumin Ur, POC: 50 mg/L

## 2013-06-26 LAB — POCT GLYCOSYLATED HEMOGLOBIN (HGB A1C): Hemoglobin A1C: 6.4

## 2013-06-26 MED ORDER — LORAZEPAM 0.5 MG PO TABS: 0.5000 mg | ORAL_TABLET | Freq: Every day | ORAL | Status: DC

## 2013-06-26 MED ORDER — AMLODIPINE BESYLATE 5 MG PO TABS: 5.0000 mg | ORAL_TABLET | Freq: Every morning | ORAL | Status: DC

## 2013-06-26 MED ORDER — OLMESARTAN MEDOXOMIL-HCTZ 40-12.5 MG PO TABS: 1.0000 | ORAL_TABLET | Freq: Every morning | ORAL | Status: DC

## 2013-06-26 MED ORDER — ROPINIROLE HCL 0.5 MG PO TABS: 0.5000 mg | ORAL_TABLET | Freq: Every day | ORAL | Status: DC

## 2013-06-26 NOTE — Patient Instructions (Signed)
Phone number for [facility].

## 2013-06-26 NOTE — Progress Notes (Signed)
Patient ID: Albert Mcdonald, male   DOB: [birth year], 42 y.o.   MRN: [id number] SUBJECTIVE: CC: Chief Complaint  Patient presents with  . Follow-up    med refills  states alot of anxiety    HPI: Doesn't take his medications correctly. Stopped taking medications.non-compliance!! Brought by friends for BP meds and something to calm his mind down. It races all night. When he was a child was Rx lithium.  Since last being seen at [facility] he has had cervical spine surgery.  Patient is here for follow up of hypertension: denies Headache;deniesChest Pain;denies weakness;denies Shortness of Breath or Orthopnea;denies Visual changes;denies palpitations;denies cough;denies pedal edema;denies symptoms of TIA or stroke; Patient was seen at [facility] in the past.  Patient is here for follow up of Diabetes Mellitus: Symptoms evaluated: Denies Nocturia ,Denies Urinary Frequency , denies Blurred vision ,deniesDizziness,denies.Dysuria,denies paresthesias, denies extremity pain or ulcers.Marland Kitchendenies chest pain. has had an annual eye exam. do check the feet. Does check CBGs. Average WUJ:[id number] check Denies episodes of hypoglycemia.not taking meds. Does have an emergency hypoglycemic plan.    Past Medical History  Diagnosis Date  . OCD (obsessive compulsive disorder)   . Kleine-Levin syndrome   . Hyperlipidemia   . ALS (amyotrophic lateral sclerosis)     patient denied on 11/01/12 -- has KLS not ALS  . Diabetes mellitus   . Hypertension   . Restless leg    Past Surgical History  Procedure Laterality Date  . Anterior cervical decomp/discectomy fusion  11/09/2012    Procedure: ANTERIOR CERVICAL DECOMPRESSION/DISCECTOMY FUSION 3 LEVELS C4-C7 ;  Surgeon: Venita Lick, MD;  Location: [facility] OR;  Service: Orthopedics;;  ACDF C4-C7    History   Social History  . Marital Status: Single    Spouse Name: N/A    Number of Children: N/A  . Years of Education: N/A   Occupational History  . Not on file.    Social History Main Topics  . Smoking status: Never Smoker   . Smokeless tobacco: Not on file     Comment: pt states addicted to drugs --cocaine  states last snort was last month  . Alcohol Use: No  . Drug Use: Yes     Comment: states snort cocaine last snort was last month per pt  . Sexual Activity: Not on file   Other Topics Concern  . Not on file   Social History Narrative  . No narrative on file   No family history on file. Current Outpatient Prescriptions on File Prior to Visit  Medication Sig Dispense Refill  . amLODipine (NORVASC) 5 MG tablet Take 5 mg by mouth every morning.       . methocarbamol (ROBAXIN) 500 MG tablet Take 1 tablet (500 mg total) by mouth 3 (three) times daily as needed.  60 tablet  0  . docusate sodium (COLACE) 100 MG capsule Take 1 capsule (100 mg total) by mouth 3 (three) times daily as needed for constipation.  30 capsule  0  . olmesartan-hydrochlorothiazide (BENICAR HCT) 40-12.5 MG per tablet Take 1 tablet by mouth every morning.      . ondansetron (ZOFRAN) 4 MG tablet Take 1 tablet (4 mg total) by mouth every 8 (eight) hours as needed for nausea.  20 tablet  0  . oxyCODONE-acetaminophen (PERCOCET) 10-325 MG per tablet Take 1 tablet by mouth every 4 (four) hours as needed for pain.  60 tablet  0  . polyethylene glycol powder (GLYCOLAX) powder Take 17 g by mouth daily.  255 g  1  . rOPINIRole (REQUIP) 0.5 MG tablet Take 0.5 mg by mouth at bedtime.       No current facility-administered medications on file prior to visit.   Allergies  Allergen Reactions  . Ace Inhibitors    Immunization History  Administered Date(s) Administered  . Pneumococcal Polysaccharide 11/10/2012   Prior to Admission medications   Medication Sig Start Date End Date Taking? Authorizing Provider  amLODipine (NORVASC) 5 MG tablet Take 5 mg by mouth every morning.    Yes Historical Provider, MD  methocarbamol (ROBAXIN) 500 MG tablet Take 1 tablet (500 mg total) by mouth 3  (three) times daily as needed. 11/10/12  Yes Venita Lick, MD  docusate sodium (COLACE) 100 MG capsule Take 1 capsule (100 mg total) by mouth 3 (three) times daily as needed for constipation. 11/10/12   Venita Lick, MD  olmesartan-hydrochlorothiazide (BENICAR HCT) 40-12.5 MG per tablet Take 1 tablet by mouth every morning.    Historical Provider, MD  ondansetron (ZOFRAN) 4 MG tablet Take 1 tablet (4 mg total) by mouth every 8 (eight) hours as needed for nausea. 11/10/12   Venita Lick, MD  oxyCODONE-acetaminophen (PERCOCET) 10-325 MG per tablet Take 1 tablet by mouth every 4 (four) hours as needed for pain. 11/10/12   Venita Lick, MD  polyethylene glycol powder (GLYCOLAX) powder Take 17 g by mouth daily. 11/10/12   Venita Lick, MD  rOPINIRole (REQUIP) 0.5 MG tablet Take 0.5 mg by mouth at bedtime.    Historical Provider, MD     ROS: As above in the HPI. All other systems are stable or negative.  OBJECTIVE: APPEARANCE:  Patient in no acute distress.The patient appeared well nourished and normally developed. Acyanotic. Waist: VITAL SIGNS:BP 160/104  Pulse 76  Temp(Src) 97.3 F (36.3 C) (Oral)  Ht 6\' 1"  (1.854 m)  Wt 238 lb 6.4 oz (108.138 kg)  BMI 31.46 kg/m2 WM restless, fidgety.   SKIN: warm and  Dry without overt rashes, tattoos and scars  HEAD and Neck: without JVD, Head and scalp: normal Eyes:No scleral icterus. Fundi normal, eye movements normal. Ears: Auricle normal, canal normal, Tympanic membranes normal, insufflation normal. Nose: normal Throat: normal Neck & thyroid: normal  CHEST & LUNGS: Chest wall: normal Lungs: Clear  CVS: Reveals the PMI to be normally located. Regular rhythm, First and Second Heart sounds are normal,  absence of murmurs, rubs or gallops. Peripheral vasculature: Radial pulses: normal Dorsal pedis pulses: normal Posterior pulses: normal  ABDOMEN:  Appearance: Obese Benign, no organomegaly, no masses, no Abdominal Aortic  enlargement. No Guarding , no rebound. No Bruits. Bowel sounds: normal  RECTAL: N/A GU: N/A  EXTREMETIES: nonedematous.  MUSCULOSKELETAL:  Spine: normal Joints: intact  NEUROLOGIC: oriented to,place and person; nonfocal.but hyperactive.  ASSESSMENT: Cervical spondylosis with myelopathy  Type II or unspecified type diabetes mellitus with unspecified complication, not stated as uncontrolled - Plan: POCT glycosylated hemoglobin (Hb A1C), POCT UA - Microalbumin, CMP14+EGFR  HLD (hyperlipidemia) - Plan: NMR, lipoprofile, TSH  Hypertension - Plan: olmesartan-hydrochlorothiazide (BENICAR HCT) 40-12.5 MG per tablet, amLODipine (NORVASC) 5 MG tablet, TSH  Kleine-Levin syndrome  OCD (obsessive compulsive disorder) - Plan: LORazepam (ATIVAN) 0.5 MG tablet, DISCONTINUED: LORazepam (ATIVAN) 0.5 MG tablet  Restless leg - Plan: rOPINIRole (REQUIP) 0.5 MG tablet, Ferritin  PLAN: Orders Placed This Encounter  Procedures  . CMP14+EGFR  . NMR, lipoprofile  . TSH  . Ferritin  . POCT glycosylated hemoglobin (Hb A1C)  . POCT UA - Microalbumin  Meds ordered this encounter  Medications  . olmesartan-hydrochlorothiazide (BENICAR HCT) 40-12.5 MG per tablet    Sig: Take 1 tablet by mouth every morning.    Dispense:  30 tablet    Refill:  5  . rOPINIRole (REQUIP) 0.5 MG tablet    Sig: Take 1 tablet (0.5 mg total) by mouth at bedtime.    Dispense:  30 tablet    Refill:  5  . amLODipine (NORVASC) 5 MG tablet    Sig: Take 1 tablet (5 mg total) by mouth every morning.    Dispense:  30 tablet    Refill:  5  . DISCONTD: LORazepam (ATIVAN) 0.5 MG tablet    Sig: Take 1 tablet (0.5 mg total) by mouth at bedtime.    Dispense:  10 tablet    Refill:  0  . LORazepam (ATIVAN) 0.5 MG tablet    Sig: Take 1 tablet (0.5 mg total) by mouth at bedtime.    Dispense:  10 tablet    Refill:  0  Rx written for the lorazepam due to malfunction of the printer.  Information on [facility] to self  refer given.  Return in about 4 weeks (around 07/24/2013) for Recheck medical problems.  Compliance with medications discussed. Records from [facility].  Islam Villescas P. Modesto Charon, M.D.

## 2013-06-27 LAB — FERRITIN: Ferritin: 94 ng/mL (ref 30–400)

## 2013-06-27 LAB — CMP14+EGFR
ALT: 24 IU/L (ref 0–44)
AST: 17 IU/L (ref 0–40)
Albumin/Globulin Ratio: 2.3 (ref 1.1–2.5)
Albumin: 4.9 g/dL (ref 3.5–5.5)
Alkaline Phosphatase: 102 IU/L (ref 39–117)
BUN/Creatinine Ratio: 14 (ref 9–20)
BUN: 11 mg/dL (ref 6–24)
CO2: 26 mmol/L (ref 18–29)
Calcium: 9.7 mg/dL (ref 8.7–10.2)
Chloride: 100 mmol/L (ref 97–108)
Creatinine, Ser: 0.76 mg/dL (ref 0.76–1.27)
GFR calc Af Amer: 131 mL/min/{1.73_m2} (ref >59)
GFR calc non Af Amer: 113 mL/min/{1.73_m2} (ref >59)
Globulin, Total: 2.1 g/dL (ref 1.5–4.5)
Glucose: 105 mg/dL — ABNORMAL HIGH (ref 65–99)
Potassium: 4 mmol/L (ref 3.5–5.2)
Sodium: 143 mmol/L (ref 134–144)
Total Bilirubin: 0.4 mg/dL (ref 0.0–1.2)
Total Protein: 7 g/dL (ref 6.0–8.5)

## 2013-06-27 LAB — NMR, LIPOPROFILE
Cholesterol: 210 mg/dL — ABNORMAL HIGH (ref <200)
HDL Cholesterol by NMR: 45 mg/dL (ref >=40)
HDL Particle Number: 31.3 umol/L (ref >=30.5)
LDL Particle Number: 1650 nmol/L — ABNORMAL HIGH (ref <1000)
LDL Size: 20.1 nm — ABNORMAL LOW (ref >20.5)
LDLC SERPL CALC-MCNC: 124 mg/dL — ABNORMAL HIGH (ref <100)
LP-IR Score: 74 — ABNORMAL HIGH (ref <=45)
Small LDL Particle Number: 990 nmol/L — ABNORMAL HIGH (ref <=527)
Triglycerides by NMR: 207 mg/dL — ABNORMAL HIGH (ref <150)

## 2013-06-27 LAB — TSH: TSH: 1.73 u[IU]/mL (ref 0.450–4.500)

## 2013-06-27 LAB — MICROALBUMIN, URINE: Microalbumin, Urine: 12.3 ug/mL (ref 0.0–17.0)

## 2013-06-29 ENCOUNTER — Telehealth: Payer: Self-pay | Admitting: Family Medicine

## 2013-06-29 NOTE — Telephone Encounter (Signed)
Spoke with pt and informed labs have not been reviewed and will call him as soon as dr Modesto Charon reviews

## 2013-07-30 ENCOUNTER — Ambulatory Visit: Payer: Medicaid Other | Admitting: Family Medicine

## 2013-08-02 ENCOUNTER — Other Ambulatory Visit: Payer: Self-pay

## 2013-11-05 ENCOUNTER — Emergency Department (HOSPITAL_COMMUNITY): Payer: Medicaid Other

## 2013-11-05 ENCOUNTER — Emergency Department (HOSPITAL_COMMUNITY)
Admission: EM | Admit: 2013-11-05 | Discharge: 2013-11-05 | Disposition: A | Discharge status: Home / Self Care | Payer: Medicaid Other | Attending: Emergency Medicine | Admitting: Emergency Medicine

## 2013-11-05 ENCOUNTER — Encounter (HOSPITAL_COMMUNITY): Payer: Self-pay | Admitting: Emergency Medicine

## 2013-11-05 DIAGNOSIS — IMO0002 Reserved for concepts with insufficient information to code with codable children: Secondary | ICD-10-CM | POA: Insufficient documentation

## 2013-11-05 DIAGNOSIS — E119 Type 2 diabetes mellitus without complications: Secondary | ICD-10-CM | POA: Insufficient documentation

## 2013-11-05 DIAGNOSIS — Z792 Long term (current) use of antibiotics: Secondary | ICD-10-CM | POA: Insufficient documentation

## 2013-11-05 DIAGNOSIS — J40 Bronchitis, not specified as acute or chronic: Secondary | ICD-10-CM | POA: Insufficient documentation

## 2013-11-05 DIAGNOSIS — Z79899 Other long term (current) drug therapy: Secondary | ICD-10-CM | POA: Insufficient documentation

## 2013-11-05 DIAGNOSIS — I10 Essential (primary) hypertension: Secondary | ICD-10-CM | POA: Insufficient documentation

## 2013-11-05 DIAGNOSIS — Z8659 Personal history of other mental and behavioral disorders: Secondary | ICD-10-CM | POA: Insufficient documentation

## 2013-11-05 DIAGNOSIS — E785 Hyperlipidemia, unspecified: Secondary | ICD-10-CM | POA: Insufficient documentation

## 2013-11-05 DIAGNOSIS — G2581 Restless legs syndrome: Secondary | ICD-10-CM | POA: Insufficient documentation

## 2013-11-05 DIAGNOSIS — Z791 Long term (current) use of non-steroidal anti-inflammatories (NSAID): Secondary | ICD-10-CM | POA: Insufficient documentation

## 2013-11-05 MED ORDER — BENZONATATE 100 MG PO CAPS: 100.0000 mg | ORAL_CAPSULE | Freq: Three times a day (TID) | ORAL | Status: DC

## 2013-11-05 MED ORDER — ALBUTEROL SULFATE HFA 108 (90 BASE) MCG/ACT IN AERS
2.0000 | INHALATION_SPRAY | Freq: Once | RESPIRATORY_TRACT | Status: AC
  Administered 2013-11-05: 2 via RESPIRATORY_TRACT
  Filled 2013-11-05: qty 6.7

## 2013-11-05 MED ORDER — BENZONATATE 100 MG PO CAPS
100.0000 mg | ORAL_CAPSULE | Freq: Once | ORAL | Status: AC
  Administered 2013-11-05: 100 mg via ORAL
  Filled 2013-11-05: qty 1

## 2013-11-05 MED ORDER — NAPROXEN 500 MG PO TABS: 500.0000 mg | ORAL_TABLET | Freq: Two times a day (BID) | ORAL | Status: DC

## 2013-11-05 NOTE — ED Provider Notes (Signed)
CSN: [id number]     Arrival date & time 11/05/13  5397 History   First MD Initiated Contact with Patient 11/05/13 0617     Chief Complaint  Patient presents with  . Chest Pain   (Consider location/radiation/quality/duration/timing/severity/associated sxs/prior Treatment) HPI Comments: 43 year old male, history of diabetes, hypertension and hyperlipidemia. He was recently seen within the last week at another hospital where he states that he had a chest x-ray performed, was prescribed Pulmicort inhaler, doxycycline and sent home. Over the last 7 days he has had no improvement in his symptoms, persistent cough which causes right-sided chest pain, has now developed a running nose and a hoarse voice. Nothing seems to make it better or worse, no associated fevers or vomiting.  Patient is a 43 y.o. male presenting with chest pain. The history is provided by the patient.  Chest Pain   Past Medical History  Diagnosis Date  . OCD (obsessive compulsive disorder)   . Kleine-Levin syndrome   . ALS (amyotrophic lateral sclerosis)     patient denied on 11/01/12 -- has KLS not ALS  . Diabetes mellitus   . Hyperlipidemia   . Hypertension   . Family history of anesthesia complication   . Restless leg    Past Surgical History  Procedure Laterality Date  . Anterior cervical decomp/discectomy fusion  11/09/2012    Procedure: ANTERIOR CERVICAL DECOMPRESSION/DISCECTOMY FUSION 3 LEVELS C4-C7 ;  Surgeon: Melina Schools, MD;  Location: [place];  Service: Orthopedics;;  ACDF C4-C7    No family history on file. History  Substance Use Topics  . Smoking status: Never Smoker   . Smokeless tobacco: Not on file     Comment: pt states addicted to drugs --cocaine  states last snort was last month  . Alcohol Use: No    Review of Systems  Cardiovascular: Positive for chest pain.  All other systems reviewed and are negative.    Allergies  Ace inhibitors  Home Medications   Current Outpatient Rx  Name  Route   Sig  Dispense  Refill  . budesonide (PULMICORT) 0.5 MG/2ML nebulizer solution   Nebulization   Take 0.5 mg by nebulization 2 (two) times daily.         Marland Kitchen doxycycline (DORYX) 100 MG DR capsule   Oral   Take 100 mg by mouth 2 (two) times daily.         Marland Kitchen lisinopril (PRINIVIL,ZESTRIL) 10 MG tablet   Oral   Take 10 mg by mouth daily.         Marland Kitchen amLODipine (NORVASC) 5 MG tablet   Oral   Take 1 tablet (5 mg total) by mouth every morning.   30 tablet   5   . benzonatate (TESSALON) 100 MG capsule   Oral   Take 1 capsule (100 mg total) by mouth every 8 (eight) hours.   21 capsule   0   . docusate sodium (COLACE) 100 MG capsule   Oral   Take 1 capsule (100 mg total) by mouth 3 (three) times daily as needed for constipation.   30 capsule   0   . LORazepam (ATIVAN) 0.5 MG tablet   Oral   Take 1 tablet (0.5 mg total) by mouth at bedtime.   10 tablet   0   . methocarbamol (ROBAXIN) 500 MG tablet   Oral   Take 1 tablet (500 mg total) by mouth 3 (three) times daily as needed.   60 tablet   0   .  naproxen (NAPROSYN) 500 MG tablet   Oral   Take 1 tablet (500 mg total) by mouth 2 (two) times daily with a meal.   30 tablet   0   . olmesartan-hydrochlorothiazide (BENICAR HCT) 40-12.5 MG per tablet   Oral   Take 1 tablet by mouth every morning.   30 tablet   5   . ondansetron (ZOFRAN) 4 MG tablet   Oral   Take 1 tablet (4 mg total) by mouth every 8 (eight) hours as needed for nausea.   20 tablet   0   . oxyCODONE-acetaminophen (PERCOCET) 10-325 MG per tablet   Oral   Take 1 tablet by mouth every 4 (four) hours as needed for pain.   60 tablet   0   . polyethylene glycol powder (GLYCOLAX) powder   Oral   Take 17 g by mouth daily.   255 g   1   . rOPINIRole (REQUIP) 0.5 MG tablet   Oral   Take 1 tablet (0.5 mg total) by mouth at bedtime.   30 tablet   5    BP 146/96  Pulse 96  Temp(Src) 98.7 F (37.1 C) (Oral)  Resp 20  Ht 6\' 1"  (1.854 m)  Wt 238 lb  (107.956 kg)  BMI 31.41 kg/m2  SpO2 99% Physical Exam  Nursing note and vitals reviewed. Constitutional: He appears well-developed and well-nourished. No distress.  HENT:  Head: Normocephalic and atraumatic.  Mouth/Throat: Oropharynx is clear and moist. No oropharyngeal exudate.  Clear rhinorrhea present in the bilateral nostrils, posterior pharynx appears normal with no erythema exudate asymmetry or hypertrophy  Eyes: Conjunctivae and EOM are normal. Pupils are equal, round, and reactive to light. Right eye exhibits no discharge. Left eye exhibits no discharge. No scleral icterus.  Neck: Normal range of motion. Neck supple. No JVD present. No thyromegaly present.  Cardiovascular: Normal rate, regular rhythm, normal heart sounds and intact distal pulses.  Exam reveals no gallop and no friction rub.   No murmur heard. Pulmonary/Chest: Effort normal and breath sounds normal. No respiratory distress. He has no wheezes. He has no rales.  Slight expiratory wheezing, speaks in full sentences, no accessory muscle use, no increased work of breathing. No rales auscultated  Abdominal: Soft. Bowel sounds are normal. He exhibits no distension and no mass. There is no tenderness.  Musculoskeletal: Normal range of motion. He exhibits no edema and no tenderness.  Lymphadenopathy:    He has no cervical adenopathy.  Neurological: He is alert. Coordination normal.  Skin: Skin is warm and dry. No rash noted. No erythema.  Psychiatric: He has a normal mood and affect. His behavior is normal.    ED Course  Procedures (including critical care time) Labs Review Labs Reviewed - No data to display Imaging Review No results found.  EKG Interpretation    Date/Time:  Monday November 05 2013 06:30:28 EST Ventricular Rate:  84 PR Interval:  170 QRS Duration: 116 QT Interval:  380 QTC Calculation: 449 R Axis:   5 Text Interpretation:  Normal sinus rhythm Incomplete right bundle branch block Borderline ECG  When compared with ECG of 06-Sep-2011 01:42, No significant change was found Confirmed by Ferrel Simington  MD, Sheralee Qazi ([id number]) on 11/05/2013 7:06:46 AM            MDM   1. Bronchitis    The patient has upper respiratory symptoms with a persistent cough, will rule out developing pneumonia, pneumothorax with an x-ray as he does have what appears  to be a pleuritic right-sided chest pain with coughing. He denies any difficulty ambulating in fact he states that he was able to walk here and walking does not cause any symptoms. It does seem to get worse when he lays down. Would consider bronchitis, pneumonia, pneumothorax doubt the latter would be less likely.  Albuterol treatment, home with bronchodilator, antitussive  I have personally seen the chest xray and interpretted - no signs of infiltrate, ECG without ischemia - primarily a respiratory illness, appears stable for d/c.  Explained plan to pt who is in agreement.  Meds given in ED:  Medications  albuterol (PROVENTIL HFA;VENTOLIN HFA) 108 (90 BASE) MCG/ACT inhaler 2 puff (2 puffs Inhalation Given 11/05/13 0710)  benzonatate (TESSALON) capsule 100 mg (100 mg Oral Given 11/05/13 0644)    New Prescriptions   BENZONATATE (TESSALON) 100 MG CAPSULE    Take 1 capsule (100 mg total) by mouth every 8 (eight) hours.   NAPROXEN (NAPROSYN) 500 MG TABLET    Take 1 tablet (500 mg total) by mouth 2 (two) times daily with a meal.      Johnna Acosta, MD 11/05/13 [phone]

## 2013-11-05 NOTE — Discharge Instructions (Signed)
Use the inhaler 2 puffs every 4 hours for 24 hours then 2 puffs every 4 hours as needed. Takes a Tessalon for her cough as prescribed, Naprosyn for body aches or chest pain with coughing.  ,Please call your doctor for a followup appointment within 24-48 hours. When you talk to your doctor please let them know that you were seen in the emergency department and have them acquire all of your records so that they can discuss the findings with you and formulate a treatment plan to fully care for your new and ongoing problems.

## 2013-11-05 NOTE — ED Notes (Signed)
Pt states he has had a cough and pressure in his chest for over a week, states he was losing his voice and unalbe to breathe well.

## 2013-12-13 IMAGING — CR DG CERVICAL SPINE 2 OR 3 VIEWS
3 series · 3 of 3 positions shown · non-contrast
Comparison: 11/09/2012 intraoperative films.

CLINICAL DATA: Status post ACDF at C4-C7.

CERVICAL SPINE - 2-3 VIEW

[xtable]
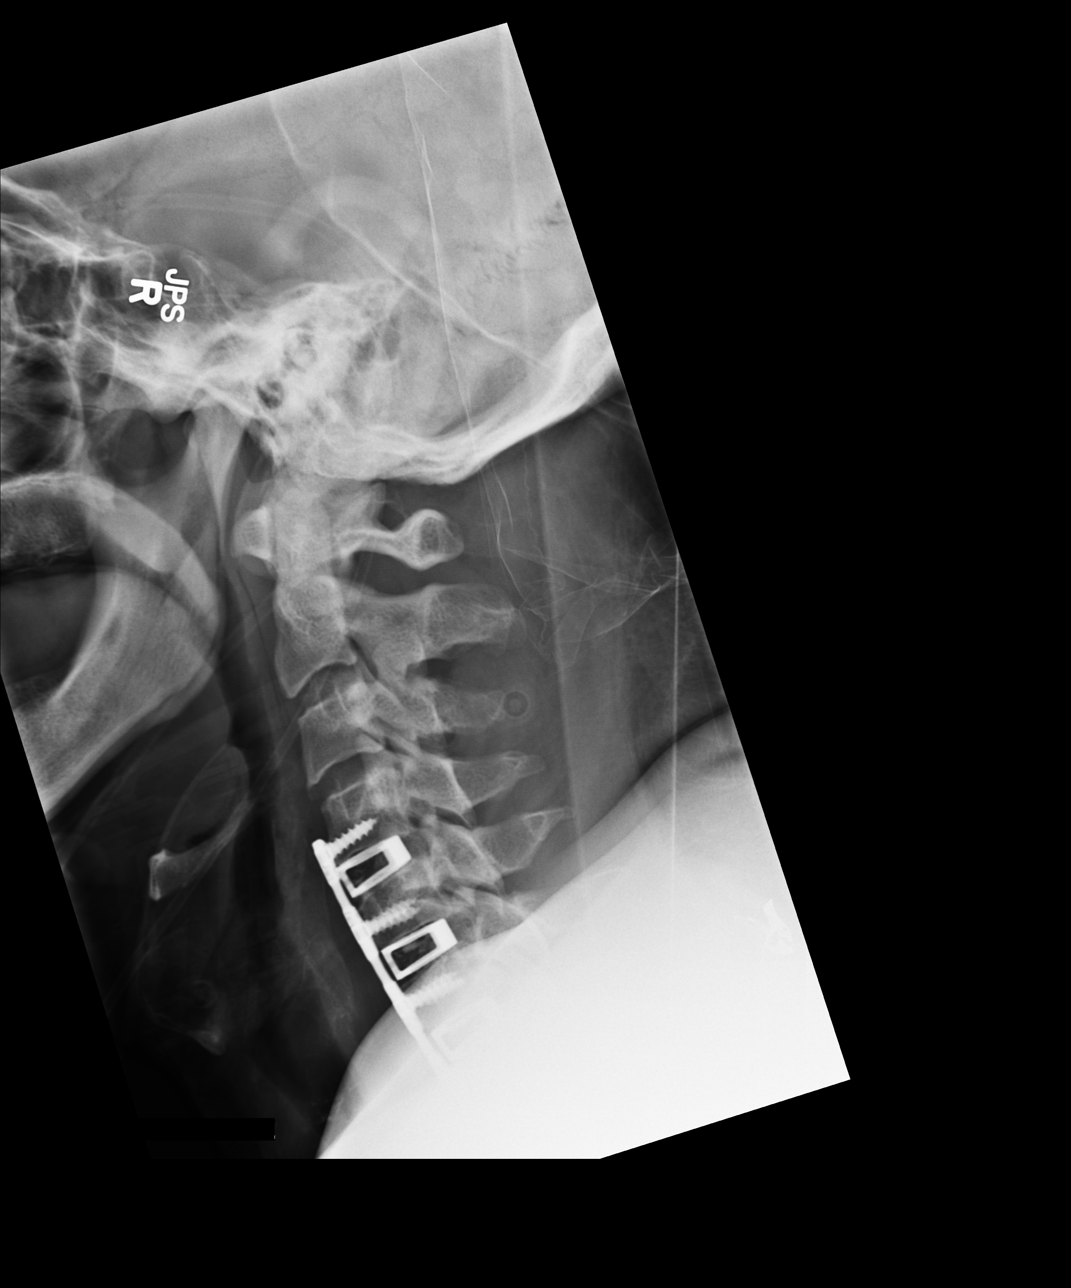

[AP]
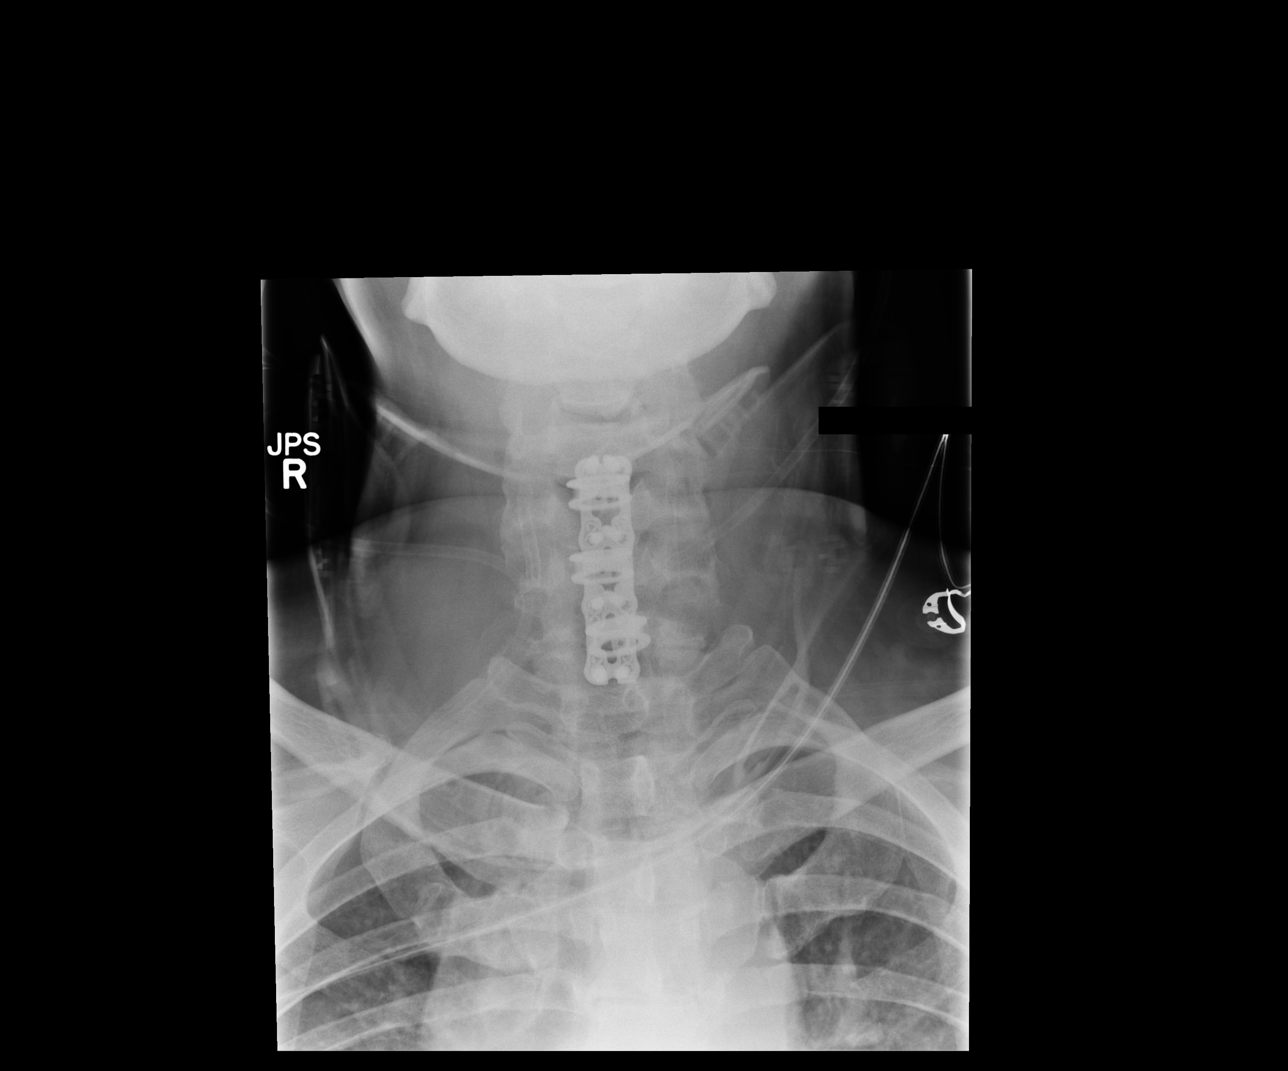

[swimmers]
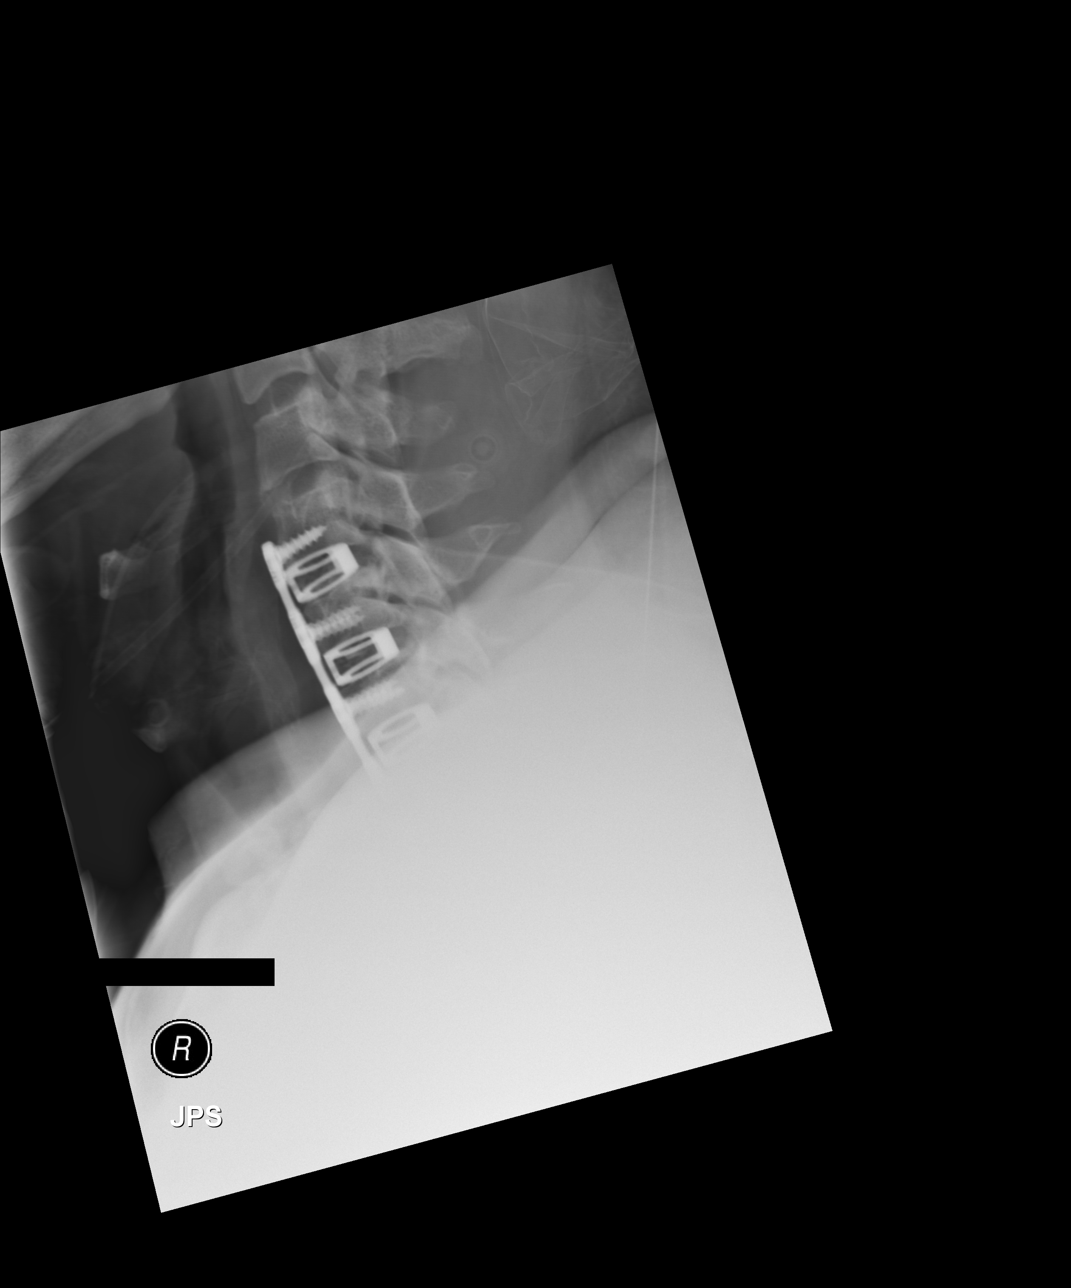

[3 of 3 positions shown; findings below may reference images not displayed]

FINDINGS: Three views of the cervical spine demonstrate
postoperative changes of a ACDF from C4-C7 with interbody graft in
place at C4-C5, C5-C6 and C6-C7 interspaces.  Alignment appears
anatomic.  Small amount of gas in the prevertebral soft tissues is
expected given the recent surgery.  Endotracheal tube has been
removed.
IMPRESSION: 1.  Postoperative changes of ACDF from C4-C7, as above.

## 2013-12-13 IMAGING — CR DG CERVICAL SPINE 2 OR 3 VIEWS
2 series · 2 of 2 positions shown · non-contrast
Comparison: Cervical spine radiographs performed 11/01/2012, and
MRI of the cervical spine performed 03/03/2012

CLINICAL DATA: Preoperative cervical spine radiographs for cervical
fusion.

CERVICAL SPINE - 2-3 VIEW

[w cervical spine lat]
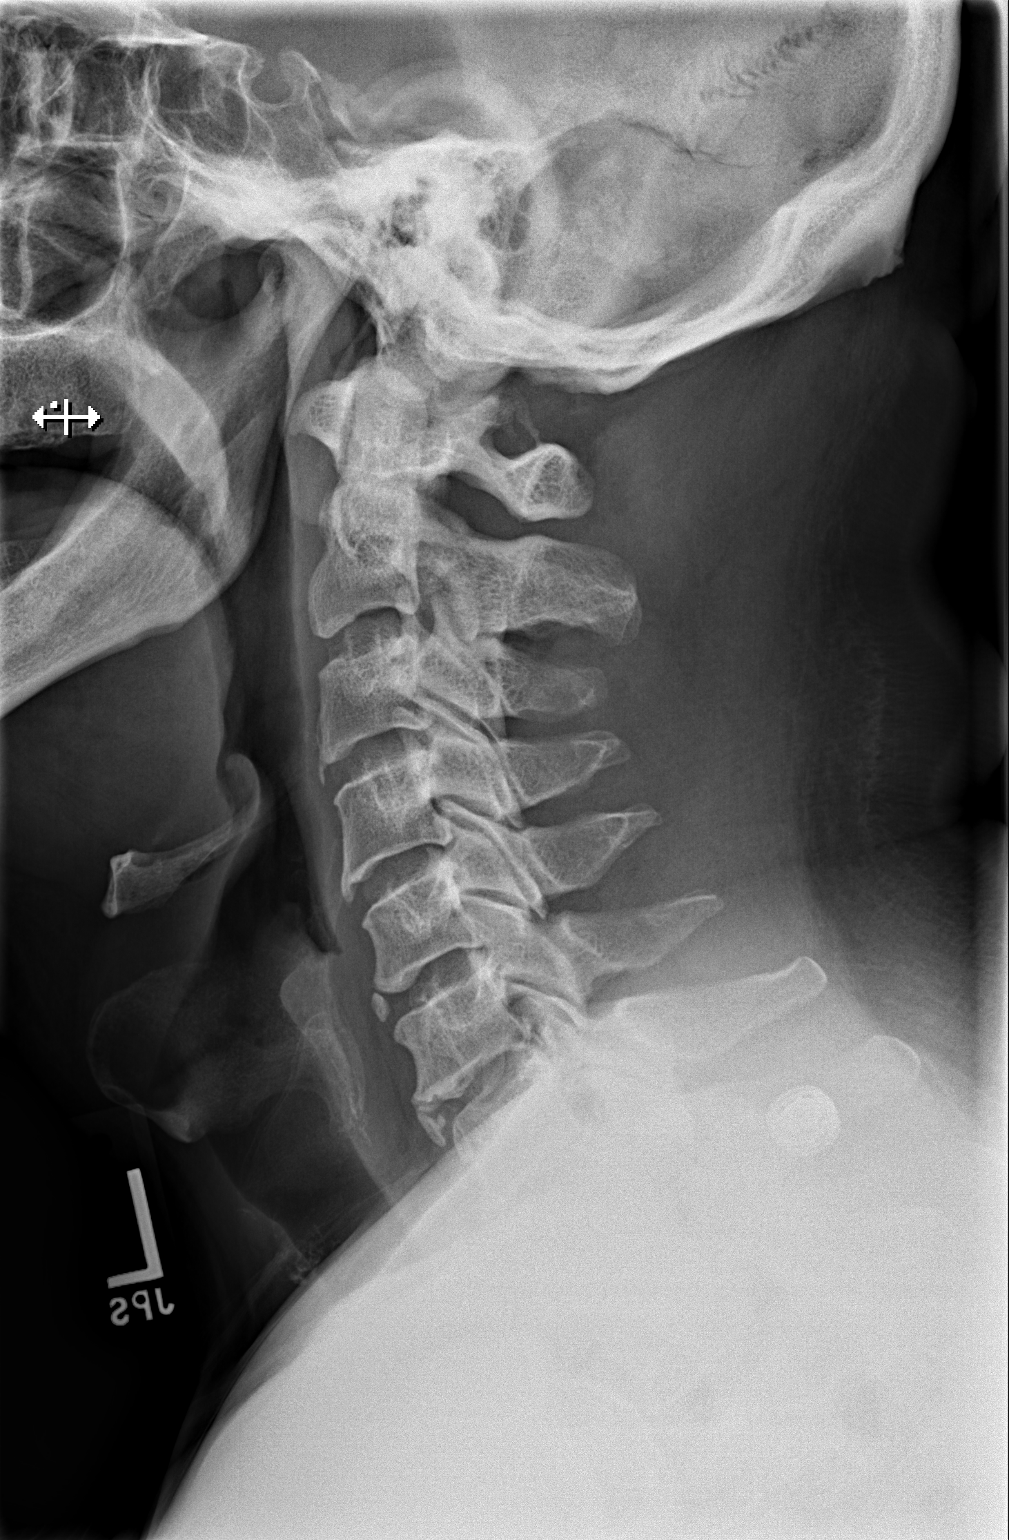

[w cervical spine ap]
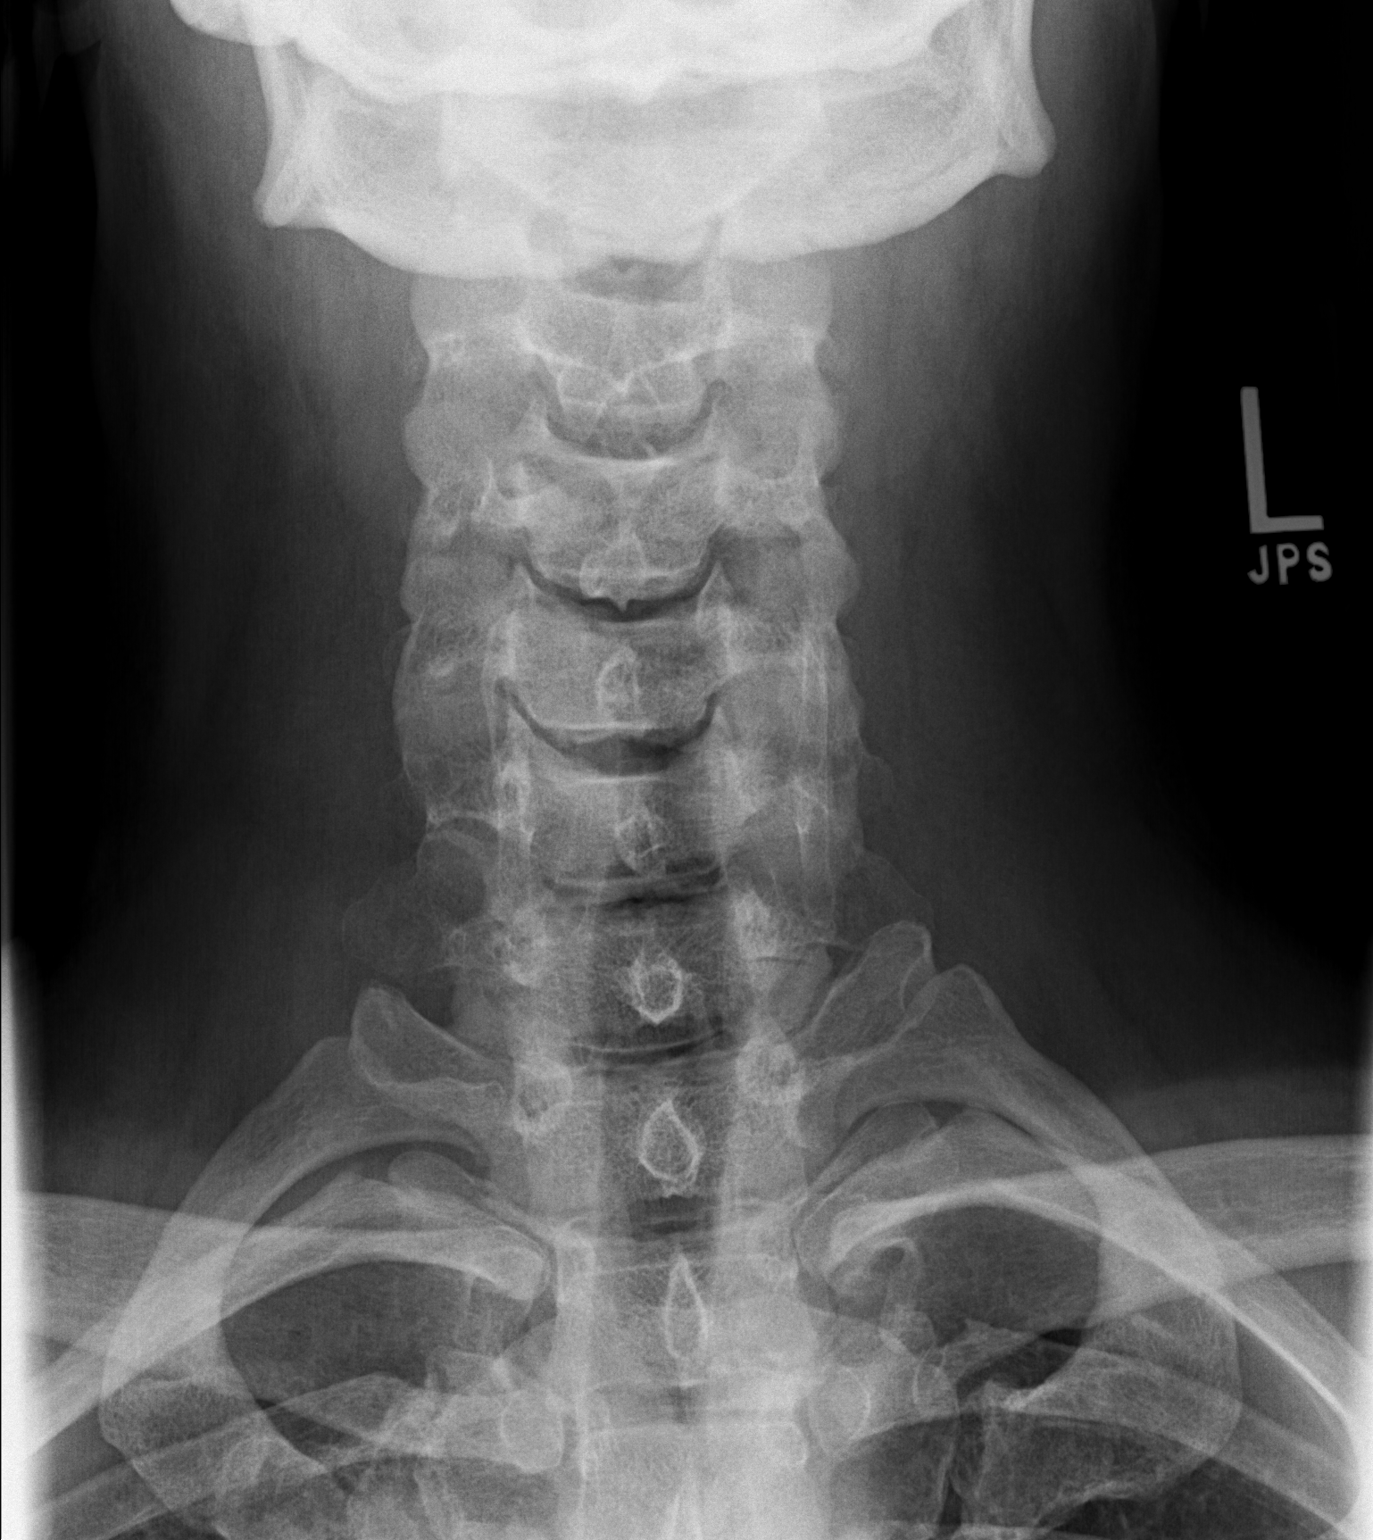

[2 of 2 positions shown; findings below may reference images not displayed]

FINDINGS: There is no evidence of fracture or subluxation.
Vertebral bodies demonstrate normal height and alignment.
Scattered anterior and posterior disc osteophyte complexes are
noted along the cervical spine.  Intervertebral disc spaces are
preserved.  Prevertebral soft tissues are within normal limits.
The provided odontoid view demonstrates no significant abnormality.

The visualized lung apices are clear.
IMPRESSION: 1.  No evidence of fracture or subluxation along the cervical
spine.
2.  Mild degenerative change noted along the cervical spine.

## 2013-12-13 IMAGING — RF DG C-ARM 61-120 MIN
1 series · 3 of 3 positions shown · non-contrast
Comparison: Preoperative radiograph 11/09/2012.

CLINICAL DATA: ACDF at C4-C7

DG C-ARM 1-60 MIN,CERVICAL SPINE - 2-3 VIEW
TECHNIQUE: Multiple intraoperative views of the cervical spine are
submitted.

[Series 1: run · 3 of 3 slices shown]
[im 1/3]
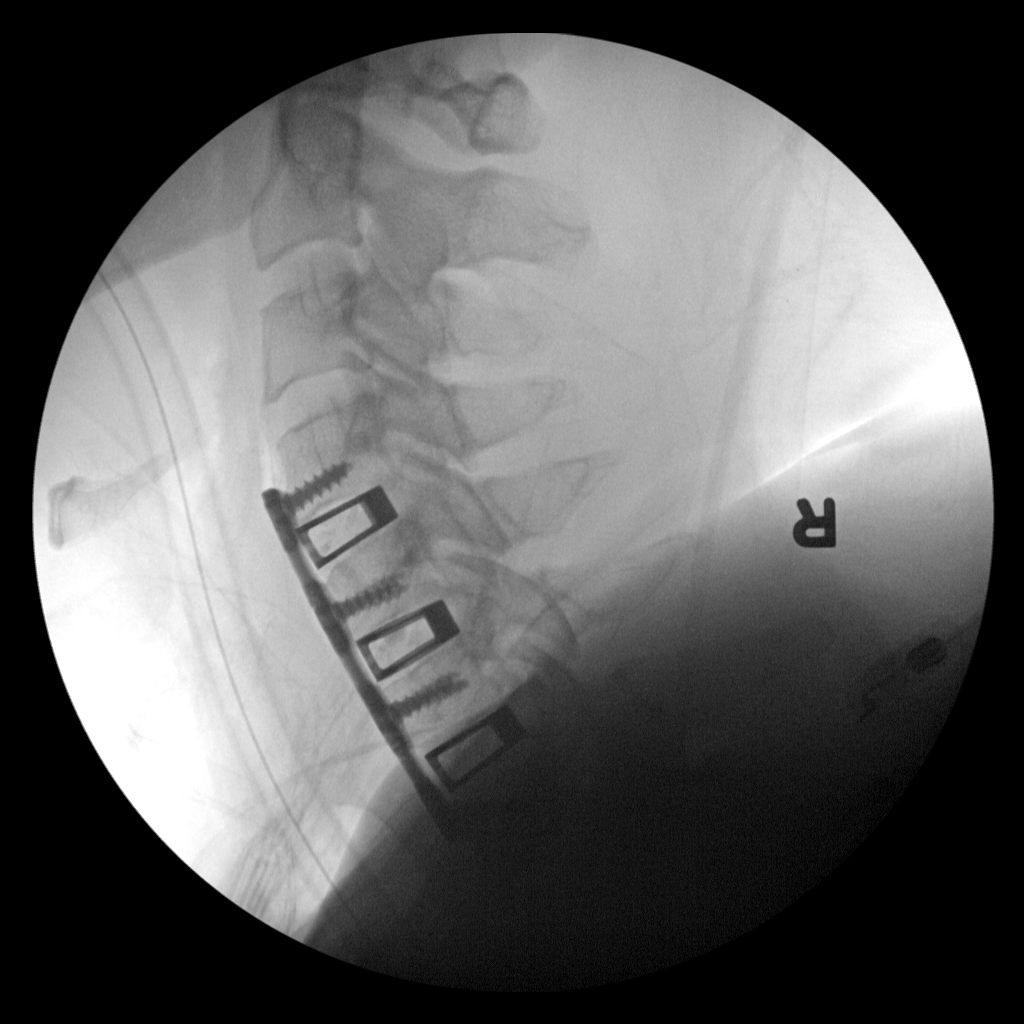
[im 2/3]
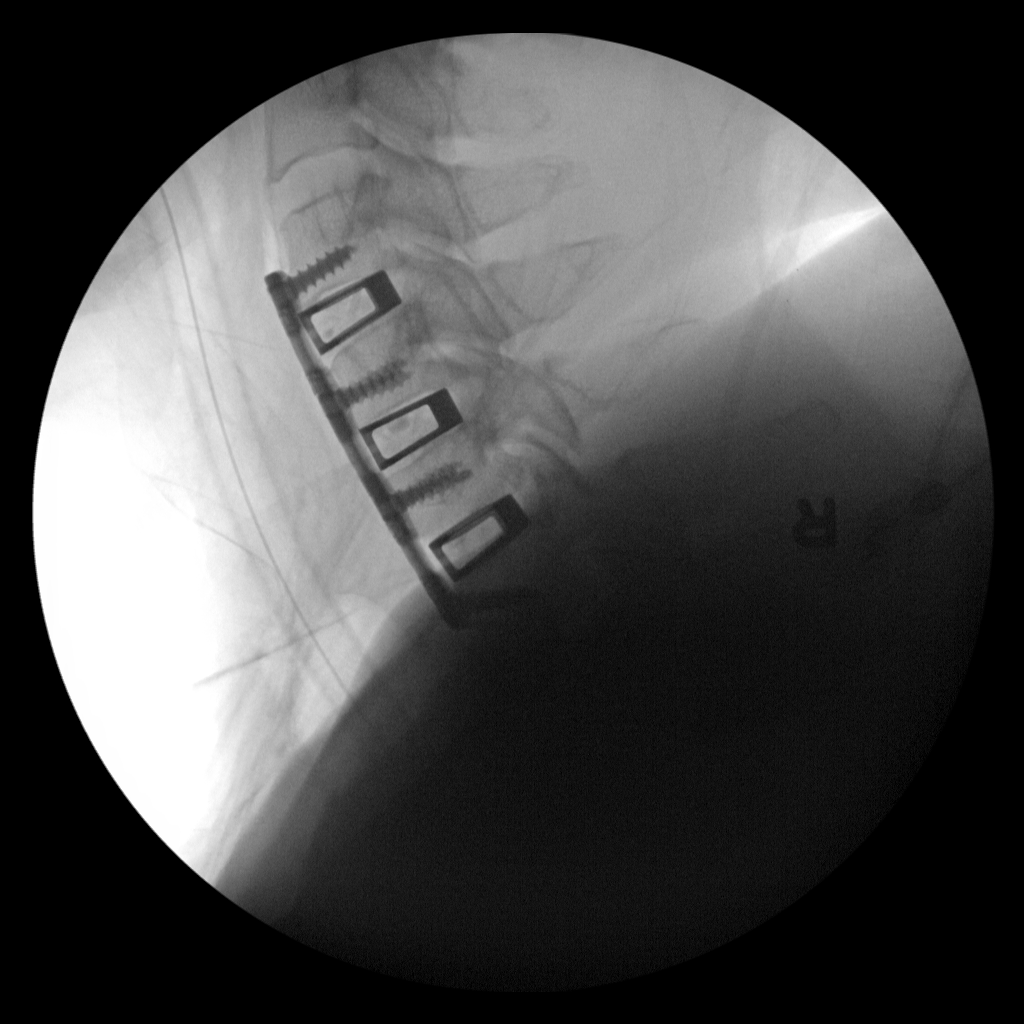
[im 3/3]
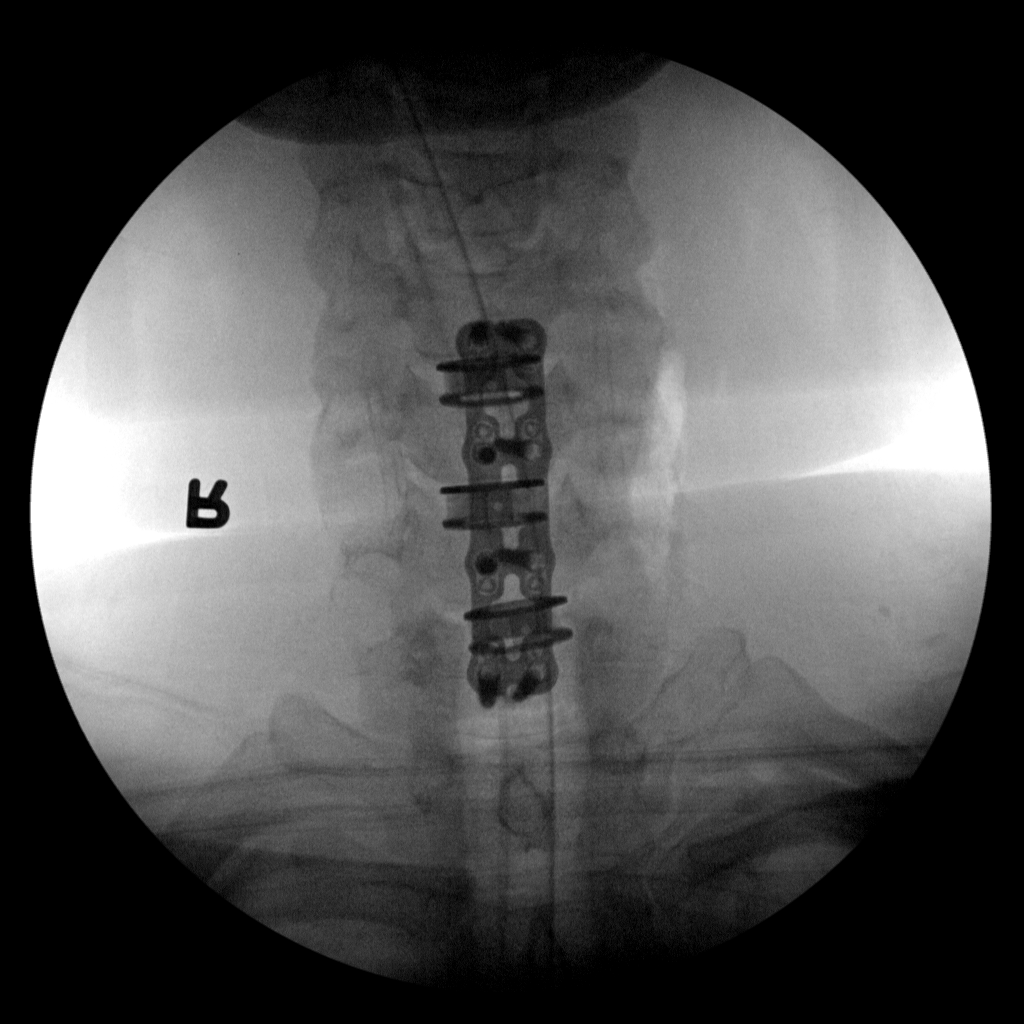

[3 of 3 positions shown; findings below may reference images not displayed]

FINDINGS: Three intraoperative fluoroscopic views of the cervical
spine demonstrate interval placement of an anterior plate screw
fixation device extending from C4-C7 with interbody grafts at the
C4-C5, C5-C6 and C6-C7 interspaces.  Alignment is anatomic.  The
patient is intubated.
IMPRESSION: 1.  Intraoperative documentation of ACDF at C4-C7, as above.

## 2014-01-18 ENCOUNTER — Ambulatory Visit: Payer: Medicaid Other | Admitting: Family Medicine

## 2014-02-06 ENCOUNTER — Ambulatory Visit: Payer: Medicaid Other | Admitting: Physician Assistant

## 2014-02-08 ENCOUNTER — Ambulatory Visit: Payer: Medicaid Other | Admitting: Physician Assistant

## 2014-03-09 ENCOUNTER — Emergency Department (HOSPITAL_COMMUNITY): Payer: Medicaid Other

## 2014-03-09 ENCOUNTER — Emergency Department (HOSPITAL_COMMUNITY)
Admission: EM | Admit: 2014-03-09 | Discharge: 2014-03-09 | Disposition: A | Discharge status: Home / Self Care | Payer: Medicaid Other | Attending: Emergency Medicine | Admitting: Emergency Medicine

## 2014-03-09 ENCOUNTER — Encounter (HOSPITAL_COMMUNITY): Payer: Self-pay | Admitting: Emergency Medicine

## 2014-03-09 DIAGNOSIS — S91339A Puncture wound without foreign body, unspecified foot, initial encounter: Secondary | ICD-10-CM

## 2014-03-09 DIAGNOSIS — Z791 Long term (current) use of non-steroidal anti-inflammatories (NSAID): Secondary | ICD-10-CM | POA: Insufficient documentation

## 2014-03-09 DIAGNOSIS — Y9301 Activity, walking, marching and hiking: Secondary | ICD-10-CM | POA: Insufficient documentation

## 2014-03-09 DIAGNOSIS — S91309A Unspecified open wound, unspecified foot, initial encounter: Secondary | ICD-10-CM | POA: Insufficient documentation

## 2014-03-09 DIAGNOSIS — I10 Essential (primary) hypertension: Secondary | ICD-10-CM | POA: Insufficient documentation

## 2014-03-09 DIAGNOSIS — Z79899 Other long term (current) drug therapy: Secondary | ICD-10-CM | POA: Insufficient documentation

## 2014-03-09 DIAGNOSIS — E119 Type 2 diabetes mellitus without complications: Secondary | ICD-10-CM | POA: Insufficient documentation

## 2014-03-09 DIAGNOSIS — Z8659 Personal history of other mental and behavioral disorders: Secondary | ICD-10-CM | POA: Insufficient documentation

## 2014-03-09 DIAGNOSIS — Y929 Unspecified place or not applicable: Secondary | ICD-10-CM | POA: Insufficient documentation

## 2014-03-09 DIAGNOSIS — E785 Hyperlipidemia, unspecified: Secondary | ICD-10-CM | POA: Insufficient documentation

## 2014-03-09 DIAGNOSIS — Z8669 Personal history of other diseases of the nervous system and sense organs: Secondary | ICD-10-CM | POA: Insufficient documentation

## 2014-03-09 DIAGNOSIS — Z23 Encounter for immunization: Secondary | ICD-10-CM | POA: Insufficient documentation

## 2014-03-09 DIAGNOSIS — Z792 Long term (current) use of antibiotics: Secondary | ICD-10-CM | POA: Insufficient documentation

## 2014-03-09 DIAGNOSIS — W269XXA Contact with unspecified sharp object(s), initial encounter: Secondary | ICD-10-CM | POA: Insufficient documentation

## 2014-03-09 MED ORDER — DOXYCYCLINE HYCLATE 100 MG PO TABS
100.0000 mg | ORAL_TABLET | Freq: Once | ORAL | Status: AC
Start: 2014-03-09 — End: 2014-03-09
  Administered 2014-03-09: 100 mg via ORAL
  Filled 2014-03-09: qty 1

## 2014-03-09 MED ORDER — KETOROLAC TROMETHAMINE 10 MG PO TABS
10.0000 mg | ORAL_TABLET | Freq: Once | ORAL | Status: AC
  Administered 2014-03-09: 10 mg via ORAL
  Filled 2014-03-09: qty 1

## 2014-03-09 MED ORDER — TETANUS-DIPHTH-ACELL PERTUSSIS 5-2.5-18.5 LF-MCG/0.5 IM SUSP
0.5000 mL | Freq: Once | INTRAMUSCULAR | Status: AC
  Administered 2014-03-09: 0.5 mL via INTRAMUSCULAR
  Filled 2014-03-09: qty 0.5

## 2014-03-09 MED ORDER — MELOXICAM 7.5 MG PO TABS: ORAL_TABLET | ORAL | Status: DC

## 2014-03-09 MED ORDER — DOXYCYCLINE HYCLATE 100 MG PO CAPS: 100.0000 mg | ORAL_CAPSULE | Freq: Two times a day (BID) | ORAL | Status: DC

## 2014-03-09 NOTE — ED Notes (Signed)
Pt c/o left foot pain; pt states he went fishing and was walking barefoot and may have stepped on something over a week ago

## 2014-03-09 NOTE — Discharge Instructions (Signed)
Your blood pressure is elevated. Please see Dr. Jacelyn Grip for recheck and or possible adjustment in your blood pressure medications. Your x-ray is negative for any hidden fracture, or foreign body that can be identified by x-ray. Please use warm tub soaks to your feet daily until this resolves. Please apply a Band-Aid to the area. Please use doxycycline and Mobic 2 times daily with food until all taken. Please see Dr. Jacelyn Grip or return to the emergency department if any pus drainage, red streaks, fever, or deterioration in her general condition.

## 2014-03-09 NOTE — ED Provider Notes (Signed)
CSN: [id number]     Arrival date & time 03/09/14  2671 History   First MD Initiated Contact with Patient 03/09/14 [phone]     Chief Complaint  Patient presents with  . Foot Pain     (Consider location/radiation/quality/duration/timing/severity/associated sxs/prior Treatment) HPI Comments:  patient presents to the emergency department with complaint of left foot pain. The patient states that he went fishing approximately a week ago  Stepped on something while walking barefoot. No fever. No red streaking. He c/o mild pain present. No previous operations or procedures on the left foot.  The history is provided by the patient.    Past Medical History  Diagnosis Date  . OCD (obsessive compulsive disorder)   . Kleine-Levin syndrome   . ALS (amyotrophic lateral sclerosis)     patient denied on 11/01/12 -- has KLS not ALS  . Diabetes mellitus   . Hyperlipidemia   . Hypertension   . Family history of anesthesia complication   . Restless leg    Past Surgical History  Procedure Laterality Date  . Anterior cervical decomp/discectomy fusion  11/09/2012    Procedure: ANTERIOR CERVICAL DECOMPRESSION/DISCECTOMY FUSION 3 LEVELS C4-C7 ;  Surgeon: Melina Schools, MD;  Location: [place];  Service: Orthopedics;;  ACDF C4-C7    History reviewed. No pertinent family history. History  Substance Use Topics  . Smoking status: Never Smoker   . Smokeless tobacco: Not on file     Comment: pt states addicted to drugs --cocaine  states last snort was last month  . Alcohol Use: No    Review of Systems  Constitutional: Negative for activity change.       All ROS Neg except as noted in HPI  HENT: Negative.   Eyes: Negative for photophobia and discharge.  Respiratory: Negative for cough, shortness of breath and wheezing.   Cardiovascular: Negative for chest pain and palpitations.  Gastrointestinal: Negative for abdominal pain and blood in stool.  Genitourinary: Negative for dysuria, frequency and hematuria.   Musculoskeletal: Positive for arthralgias. Negative for back pain and neck pain.  Skin: Negative.   Neurological: Negative for dizziness, seizures and speech difficulty.  Psychiatric/Behavioral: Negative for hallucinations and confusion.      Allergies  Ace inhibitors  Home Medications   Prior to Admission medications   Medication Sig Start Date End Date Taking? Authorizing Provider  amLODipine (NORVASC) 5 MG tablet Take 1 tablet (5 mg total) by mouth every morning. 06/26/13   Vernie Shanks, MD  benzonatate (TESSALON) 100 MG capsule Take 1 capsule (100 mg total) by mouth every 8 (eight) hours. 11/05/13   Johnna Acosta, MD  budesonide (PULMICORT) 0.5 MG/2ML nebulizer solution Take 0.5 mg by nebulization 2 (two) times daily.    Historical Provider, MD  docusate sodium (COLACE) 100 MG capsule Take 1 capsule (100 mg total) by mouth 3 (three) times daily as needed for constipation. 11/10/12   Melina Schools, MD  doxycycline (DORYX) 100 MG DR capsule Take 100 mg by mouth 2 (two) times daily.    Historical Provider, MD  lisinopril (PRINIVIL,ZESTRIL) 10 MG tablet Take 10 mg by mouth daily.    Historical Provider, MD  LORazepam (ATIVAN) 0.5 MG tablet Take 1 tablet (0.5 mg total) by mouth at bedtime. 06/26/13   Vernie Shanks, MD  methocarbamol (ROBAXIN) 500 MG tablet Take 1 tablet (500 mg total) by mouth 3 (three) times daily as needed. 11/10/12   Melina Schools, MD  naproxen (NAPROSYN) 500 MG tablet Take 1 tablet (  500 mg total) by mouth 2 (two) times daily with a meal. 11/05/13   Johnna Acosta, MD  olmesartan-hydrochlorothiazide (BENICAR HCT) 40-12.5 MG per tablet Take 1 tablet by mouth every morning. 06/26/13   Vernie Shanks, MD  ondansetron (ZOFRAN) 4 MG tablet Take 1 tablet (4 mg total) by mouth every 8 (eight) hours as needed for nausea. 11/10/12   Melina Schools, MD  oxyCODONE-acetaminophen (PERCOCET) 10-325 MG per tablet Take 1 tablet by mouth every 4 (four) hours as needed for pain. 11/10/12    Melina Schools, MD  polyethylene glycol powder (GLYCOLAX) powder Take 17 g by mouth daily. 11/10/12   Melina Schools, MD  rOPINIRole (REQUIP) 0.5 MG tablet Take 1 tablet (0.5 mg total) by mouth at bedtime. 06/26/13   Vernie Shanks, MD   BP 190/115  Pulse 81  Temp(Src) 97.9 F (36.6 C) (Oral)  Resp 20  Ht 6\' 1"  (1.854 m)  Wt 228 lb (103.42 kg)  BMI 30.09 kg/m2  SpO2 97% Physical Exam  Nursing note and vitals reviewed. Constitutional: He is oriented to person, place, and time. He appears well-developed and well-nourished.  Non-toxic appearance.  HENT:  Head: Normocephalic.  Right Ear: Tympanic membrane and external ear normal.  Left Ear: Tympanic membrane and external ear normal.  Eyes: EOM and lids are normal. Pupils are equal, round, and reactive to light.  Neck: Normal range of motion. Neck supple. Carotid bruit is not present.  Cardiovascular: Normal rate, regular rhythm, normal heart sounds, intact distal pulses and normal pulses.   Pulmonary/Chest: Breath sounds normal. No respiratory distress.  Abdominal: Soft. Bowel sounds are normal. There is no tenderness. There is no guarding.  Musculoskeletal: Normal range of motion.  The patient has what looks to be a puncture wound of the plantar surface just below the metatarsal heads. There is a slight raised area in the region of the puncture wound. No drainage. No red streaks appreciated. There is full range of motion of the toes. There is full range of motion of the left ankle. The dorsalis pedis pulse is 2+ on the right and the left.  Lymphadenopathy:       Head (right side): No submandibular adenopathy present.       Head (left side): No submandibular adenopathy present.    He has no cervical adenopathy.  Neurological: He is alert and oriented to person, place, and time. He has normal strength. No cranial nerve deficit or sensory deficit.  Skin: Skin is warm and dry.  Psychiatric: He has a normal mood and affect. His speech is normal.     ED Course  Procedures (including critical care time) Labs Review Labs Reviewed - No data to display  Imaging Review No results found.   EKG Interpretation None      MDM The vital signs are within normal limits with exception of the blood pressure being 190/115. The pulse oximetry is 97% on room air. Within normal limits by my interpretation. The x-ray of your foot is negative for occult fracture or foreign body. There the patient will use warm tub soaks daily. Prescription for doxycycline and Mobic given to the patient with instructions to take these daily until all taken. The patient is to see his primary physician or return to the emergency department if any signs of advancing infection. Pt also advised to have his blood pressure rechecked.  The patient he knowledges  understanding of the discharge instructions.    Final diagnoses:  None    **  I have reviewed nursing notes, vital signs, and all appropriate lab and imaging results for this patient.Lenox Ahr, PA-C 03/09/14 1302  Lenox Ahr, PA-C 03/09/14 1701

## 2014-03-09 NOTE — ED Notes (Signed)
nad noted prior to dc. Dc instructions reviewed and explained.  

## 2014-03-10 NOTE — ED Provider Notes (Signed)
Medical screening examination/treatment/procedure(s) were performed by non-physician practitioner and as supervising physician I was immediately available for consultation/collaboration.   EKG Interpretation None       Orlie Dakin, MD 03/10/14 [phone]

## 2014-04-09 ENCOUNTER — Ambulatory Visit (INDEPENDENT_AMBULATORY_CARE_PROVIDER_SITE_OTHER): Payer: Medicaid Other | Admitting: Family

## 2014-04-09 ENCOUNTER — Encounter: Payer: Self-pay | Admitting: Family

## 2014-04-09 VITALS — BP 159/104 | HR 71 | Temp 97.8°F | Ht 73.0 in | Wt 236.4 lb

## 2014-04-09 DIAGNOSIS — E785 Hyperlipidemia, unspecified: Secondary | ICD-10-CM

## 2014-04-09 DIAGNOSIS — I10 Essential (primary) hypertension: Secondary | ICD-10-CM

## 2014-04-09 DIAGNOSIS — Z125 Encounter for screening for malignant neoplasm of prostate: Secondary | ICD-10-CM

## 2014-04-09 DIAGNOSIS — G259 Extrapyramidal and movement disorder, unspecified: Secondary | ICD-10-CM

## 2014-04-09 DIAGNOSIS — F909 Attention-deficit hyperactivity disorder, unspecified type: Secondary | ICD-10-CM

## 2014-04-09 DIAGNOSIS — M542 Cervicalgia: Secondary | ICD-10-CM

## 2014-04-09 DIAGNOSIS — M549 Dorsalgia, unspecified: Secondary | ICD-10-CM

## 2014-04-09 DIAGNOSIS — G8929 Other chronic pain: Secondary | ICD-10-CM

## 2014-04-09 DIAGNOSIS — E118 Type 2 diabetes mellitus with unspecified complications: Secondary | ICD-10-CM

## 2014-04-09 LAB — POCT GLYCOSYLATED HEMOGLOBIN (HGB A1C): HEMOGLOBIN A1C: 6.7

## 2014-04-09 MED ORDER — HYDROCHLOROTHIAZIDE 25 MG PO TABS: 25.0000 mg | ORAL_TABLET | Freq: Every day | ORAL | Status: DC

## 2014-04-09 NOTE — Patient Instructions (Signed)

## 2014-04-09 NOTE — Progress Notes (Signed)
   Subjective:    Patient ID: Albert Mcdonald, male    DOB: 06/29/1971, 43 y.o.   MRN: [id number]  HPI Pt here for chronic visit and has been diagnosed with hypertension, hyperlipidemia, and Diabetes Mellitus. Pt currently not taking any medications for his chronic conditions. States he "ran out of refills while ago".   Pt also has chronic neck and back pain. Pt states he had neck surgery on 11/10/12. Pt would like a referral to ortho today.    Review of Systems  Constitutional: Negative.   HENT: Negative.   Respiratory: Negative.   Cardiovascular: Negative.   Gastrointestinal: Negative.   Endocrine: Negative.   Genitourinary: Negative.   Musculoskeletal: Positive for back pain, joint swelling and neck pain.  Neurological: Positive for tremors.  Hematological: Negative.   Psychiatric/Behavioral: The patient is nervous/anxious.   All other systems reviewed and are negative.      Objective:   Physical Exam  Vitals reviewed. Constitutional: He is oriented to person, place, and time. He appears well-developed and well-nourished. No distress.  HENT:  Head: Normocephalic.  Right Ear: External ear normal.  Left Ear: External ear normal.  Mouth/Throat: Oropharynx is clear and moist.  Eyes: Pupils are equal, round, and reactive to light. Right eye exhibits no discharge. Left eye exhibits no discharge.  Neck: Normal range of motion. Neck supple. No thyromegaly present.  Cardiovascular: Normal rate, regular rhythm, normal heart sounds and intact distal pulses.   No murmur heard. Pulmonary/Chest: Effort normal and breath sounds normal. No respiratory distress. He has no wheezes.  Abdominal: Soft. Bowel sounds are normal. He exhibits no distension. There is no tenderness.  Musculoskeletal: Normal range of motion. He exhibits no edema and no tenderness.  Neurological: He is alert and oriented to person, place, and time. He has normal reflexes. No cranial nerve deficit.  Skin: Skin is  warm and dry. No rash noted. No erythema.  Psychiatric: He has a normal mood and affect. His behavior is normal. Judgment and thought content normal.    BP 159/104  Pulse 71  Temp(Src) 97.8 F (36.6 C) (Oral)  Ht $R'6\' 1"'oW$  (1.854 m)  Wt 236 lb 6.4 oz (107.23 kg)  BMI 31.20 kg/m2       Assessment & Plan:  1. Essential hypertension -Pt started on hydrochlorothiazide 25 mg - CMP14+EGFR - hydrochlorothiazide (HYDRODIURIL) 25 MG tablet; Take 1 tablet (25 mg total) by mouth daily.  Dispense: 90 tablet; Refill: 3  2. Type II or unspecified type diabetes mellitus with unspecified complication, not stated as uncontrolled - POCT glycosylated hemoglobin (Hb A1C)  3. Hyperlipidemia -Pt states he is hesitate to start any cholesterol medications, but will decide once lab work comes back - Lipid panel  4. Movement disorder - Thyroid Panel With TSH - Vit D  25 hydroxy (rtn osteoporosis monitoring) - Ambulatory referral to Neurology  5. Prostate cancer screening - PSA, total and free  6. Chronic neck and back pain - Ambulatory referral to Orthopedic Surgery  7. Hyperactive adult syndrome - Ambulatory referral to Neurology   Continue all meds Labs pending Health Maintenance reviewed Diet and exercise encouraged RTO 2 weeks  Evelina Dun, FNP

## 2014-04-10 ENCOUNTER — Other Ambulatory Visit: Payer: Self-pay | Admitting: Family

## 2014-04-10 LAB — LIPID PANEL
Chol/HDL Ratio: 5.6 ratio units — ABNORMAL HIGH (ref 0.0–5.0)
Cholesterol, Total: 196 mg/dL (ref 100–199)
HDL: 35 mg/dL — AB (ref >39)
LDL Calculated: 99 mg/dL (ref 0–99)
TRIGLYCERIDES: 312 mg/dL — AB (ref 0–149)
VLDL Cholesterol Cal: 62 mg/dL — ABNORMAL HIGH (ref 5–40)

## 2014-04-10 LAB — CMP14+EGFR
ALK PHOS: 99 IU/L (ref 39–117)
ALT: 27 IU/L (ref 0–44)
AST: 21 IU/L (ref 0–40)
Albumin/Globulin Ratio: 2.8 — ABNORMAL HIGH (ref 1.1–2.5)
Albumin: 5.1 g/dL (ref 3.5–5.5)
BUN/Creatinine Ratio: 12 (ref 9–20)
BUN: 10 mg/dL (ref 6–24)
CALCIUM: 9.8 mg/dL (ref 8.7–10.2)
CO2: 25 mmol/L (ref 18–29)
Chloride: 100 mmol/L (ref 97–108)
Creatinine, Ser: 0.83 mg/dL (ref 0.76–1.27)
GFR calc Af Amer: 125 mL/min/{1.73_m2} (ref >59)
GFR calc non Af Amer: 109 mL/min/{1.73_m2} (ref >59)
Globulin, Total: 1.8 g/dL (ref 1.5–4.5)
Glucose: 157 mg/dL — ABNORMAL HIGH (ref 65–99)
Potassium: 4.8 mmol/L (ref 3.5–5.2)
SODIUM: 142 mmol/L (ref 134–144)
Total Bilirubin: 0.3 mg/dL (ref 0.0–1.2)
Total Protein: 6.9 g/dL (ref 6.0–8.5)

## 2014-04-10 LAB — THYROID PANEL WITH TSH
FREE THYROXINE INDEX: 2.5 (ref 1.2–4.9)
T3 UPTAKE RATIO: 29 % (ref 24–39)
T4, Total: 8.5 ug/dL (ref 4.5–12.0)
TSH: 1.81 u[IU]/mL (ref 0.450–4.500)

## 2014-04-10 LAB — PSA, TOTAL AND FREE
PSA, Free Pct: 60 %
PSA, Free: 0.18 ng/mL
PSA: 0.3 ng/mL (ref 0.0–4.0)

## 2014-04-10 LAB — VITAMIN D 25 HYDROXY (VIT D DEFICIENCY, FRACTURES): Vit D, 25-Hydroxy: 9.3 ng/mL — ABNORMAL LOW (ref 30.0–100.0)

## 2014-04-10 MED ORDER — SIMVASTATIN 40 MG PO TABS: 40.0000 mg | ORAL_TABLET | Freq: Every day | ORAL | Status: DC

## 2014-04-10 MED ORDER — VITAMIN D (ERGOCALCIFEROL) 1.25 MG (50000 UNIT) PO CAPS
50000.0000 [IU] | ORAL_CAPSULE | ORAL | Status: DC
Start: 2014-04-10 — End: 2014-05-13

## 2014-04-22 ENCOUNTER — Other Ambulatory Visit: Payer: Self-pay | Admitting: Family

## 2014-04-24 ENCOUNTER — Encounter: Payer: Self-pay | Admitting: Family

## 2014-04-24 ENCOUNTER — Ambulatory Visit (INDEPENDENT_AMBULATORY_CARE_PROVIDER_SITE_OTHER): Payer: Medicaid Other | Admitting: Family

## 2014-04-24 ENCOUNTER — Telehealth: Payer: Self-pay | Admitting: Family

## 2014-04-24 VITALS — BP 179/118 | HR 78 | Temp 97.6°F | Ht 73.0 in | Wt 228.6 lb

## 2014-04-24 DIAGNOSIS — E785 Hyperlipidemia, unspecified: Secondary | ICD-10-CM

## 2014-04-24 DIAGNOSIS — I10 Essential (primary) hypertension: Secondary | ICD-10-CM

## 2014-04-24 MED ORDER — PRAVASTATIN SODIUM 40 MG PO TABS: 40.0000 mg | ORAL_TABLET | Freq: Every day | ORAL | Status: DC

## 2014-04-24 MED ORDER — LOSARTAN POTASSIUM 100 MG PO TABS
100.0000 mg | ORAL_TABLET | Freq: Every day | ORAL | Status: DC
Start: 2014-04-24 — End: 2014-05-13

## 2014-04-24 NOTE — Patient Instructions (Signed)
DASH Eating Plan DASH stands for "Dietary Approaches to Stop Hypertension." The DASH eating plan is a healthy eating plan that has been shown to reduce high blood pressure (hypertension). Additional health benefits may include reducing the risk of type 2 diabetes mellitus, heart disease, and stroke. The DASH eating plan may also help with weight loss. WHAT DO I NEED TO KNOW ABOUT THE DASH EATING PLAN? For the DASH eating plan, you will follow these general guidelines:  Choose foods with a percent daily value for sodium of less than 5% (as listed on the food label).  Use salt-free seasonings or herbs instead of table salt or sea salt.  Check with your health care provider or pharmacist before using salt substitutes.  Eat lower-sodium products, often labeled as "lower sodium" or "no salt added."  Eat fresh foods.  Eat more vegetables, fruits, and low-fat dairy products.  Choose whole grains. Look for the word "whole" as the first word in the ingredient list.  Choose fish and skinless chicken or turkey more often than red meat. Limit fish, poultry, and meat to 6 oz (170 g) each day.  Limit sweets, desserts, sugars, and sugary drinks.  Choose heart-healthy fats.  Limit cheese to 1 oz (28 g) per day.  Eat more home-cooked food and less restaurant, buffet, and fast food.  Limit fried foods.  Cook foods using methods other than frying.  Limit canned vegetables. If you do use them, rinse them well to decrease the sodium.  When eating at a restaurant, ask that your food be prepared with less salt, or no salt if possible. WHAT FOODS CAN I EAT? Seek help from a dietitian for individual calorie needs. Grains Whole grain or whole wheat bread. Brown rice. Whole grain or whole wheat pasta. Quinoa, bulgur, and whole grain cereals. Low-sodium cereals. Corn or whole wheat flour tortillas. Whole grain cornbread. Whole grain crackers. Low-sodium crackers. Vegetables Fresh or frozen vegetables  (raw, steamed, roasted, or grilled). Low-sodium or reduced-sodium tomato and vegetable juices. Low-sodium or reduced-sodium tomato sauce and paste. Low-sodium or reduced-sodium canned vegetables.  Fruits All fresh, canned (in natural juice), or frozen fruits. Meat and Other Protein Products Ground beef (85% or leaner), grass-fed beef, or beef trimmed of fat. Skinless chicken or turkey. Ground chicken or turkey. Pork trimmed of fat. All fish and seafood. Eggs. Dried beans, peas, or lentils. Unsalted nuts and seeds. Unsalted canned beans. Dairy Low-fat dairy products, such as skim or 1% milk, 2% or reduced-fat cheeses, low-fat ricotta or cottage cheese, or plain low-fat yogurt. Low-sodium or reduced-sodium cheeses. Fats and Oils Tub margarines without trans fats. Light or reduced-fat mayonnaise and salad dressings (reduced sodium). Avocado. Safflower, olive, or canola oils. Natural peanut or almond butter. Other Unsalted popcorn and pretzels. The items listed above may not be a complete list of recommended foods or beverages. Contact your dietitian for more options. WHAT FOODS ARE NOT RECOMMENDED? Grains White bread. White pasta. White rice. Refined cornbread. Bagels and croissants. Crackers that contain trans fat. Vegetables Creamed or fried vegetables. Vegetables in a cheese sauce. Regular canned vegetables. Regular canned tomato sauce and paste. Regular tomato and vegetable juices. Fruits Dried fruits. Canned fruit in light or heavy syrup. Fruit juice. Meat and Other Protein Products Fatty cuts of meat. Ribs, chicken wings, bacon, sausage, bologna, salami, chitterlings, fatback, hot dogs, bratwurst, and packaged luncheon meats. Salted nuts and seeds. Canned beans with salt. Dairy Whole or 2% milk, cream, half-and-half, and cream cheese. Whole-fat or sweetened yogurt. Full-fat   cheeses or blue cheese. Nondairy creamers and whipped toppings. Processed cheese, cheese spreads, or cheese  curds. Condiments Onion and garlic salt, seasoned salt, table salt, and sea salt. Canned and packaged gravies. Worcestershire sauce. Tartar sauce. Barbecue sauce. Teriyaki sauce. Soy sauce, including reduced sodium. Steak sauce. Fish sauce. Oyster sauce. Cocktail sauce. Horseradish. Ketchup and mustard. Meat flavorings and tenderizers. Bouillon cubes. Hot sauce. Tabasco sauce. Marinades. Taco seasonings. Relishes. Fats and Oils Butter, stick margarine, lard, shortening, ghee, and bacon fat. Coconut, palm kernel, or palm oils. Regular salad dressings. Other Pickles and olives. Salted popcorn and pretzels. The items listed above may not be a complete list of foods and beverages to avoid. Contact your dietitian for more information. WHERE CAN I FIND MORE INFORMATION? National Heart, Lung, and [employer] Institute: www.nhlbi.nih.gov/health/health-topics/topics/dash/ Document Released: 09/02/2011 Document Revised: 01/28/2014 Document Reviewed: 07/18/2013 ExitCare Patient Information 2015 [employer]. This information is not intended to replace advice given to you by your health care provider. Make sure you discuss any questions you have with your health care provider. Hypertension Hypertension, commonly called high blood pressure, is when the force of blood pumping through your arteries is too strong. Your arteries are the blood vessels that carry blood from your heart throughout your body. A blood pressure reading consists of a higher number over a lower number, such as 110/72. The higher number (systolic) is the pressure inside your arteries when your heart pumps. The lower number (diastolic) is the pressure inside your arteries when your heart relaxes. Ideally you want your blood pressure below 120/80. Hypertension forces your heart to work harder to pump blood. Your arteries may become narrow or stiff. Having hypertension puts you at risk for heart disease, stroke, and other problems.  RISK  FACTORS Some risk factors for high blood pressure are controllable. Others are not.  Risk factors you cannot control include:   Race. You may be at higher risk if you are African American.  Age. Risk increases with age.  Gender. Men are at higher risk than women before age 45 years. After age 65, women are at higher risk than men. Risk factors you can control include:  Not getting enough exercise or physical activity.  Being overweight.  Getting too much fat, sugar, calories, or salt in your diet.  Drinking too much alcohol. SIGNS AND SYMPTOMS Hypertension does not usually cause signs or symptoms. Extremely high blood pressure (hypertensive crisis) may cause headache, anxiety, shortness of breath, and nosebleed. DIAGNOSIS  To check if you have hypertension, your health care provider will measure your blood pressure while you are seated, with your arm held at the level of your heart. It should be measured at least twice using the same arm. Certain conditions can cause a difference in blood pressure between your right and left arms. A blood pressure reading that is higher than normal on one occasion does not mean that you need treatment. If one blood pressure reading is high, ask your health care provider about having it checked again. TREATMENT  Treating high blood pressure includes making lifestyle changes and possibly taking medicine. Living a healthy lifestyle can help lower high blood pressure. You may need to change some of your habits. Lifestyle changes may include:  Following the DASH diet. This diet is high in fruits, vegetables, and whole grains. It is low in salt, red meat, and added sugars.  Getting at least 2 hours of brisk physical activity every week.  Losing weight if necessary.  Not smoking.  Limiting   alcoholic beverages.  Learning ways to reduce stress. If lifestyle changes are not enough to get your blood pressure under control, your health care provider may  prescribe medicine. You may need to take more than one. Work closely with your health care provider to understand the risks and benefits. HOME CARE INSTRUCTIONS  Have your blood pressure rechecked as directed by your health care provider.   Take medicines only as directed by your health care provider. Follow the directions carefully. Blood pressure medicines must be taken as prescribed. The medicine does not work as well when you skip doses. Skipping doses also puts you at risk for problems.   Do not smoke.   Monitor your blood pressure at home as directed by your health care provider. SEEK MEDICAL CARE IF:   You think you are having a reaction to medicines taken.  You have recurrent headaches or feel dizzy.  You have swelling in your ankles.  You have trouble with your vision. SEEK IMMEDIATE MEDICAL CARE IF:  You develop a severe headache or confusion.  You have unusual weakness, numbness, or feel faint.  You have severe chest or abdominal pain.  You vomit repeatedly.  You have trouble breathing. MAKE SURE YOU:   Understand these instructions.  Will watch your condition.  Will get help right away if you are not doing well or get worse. Document Released: 09/13/2005 Document Revised: 01/28/2014 Document Reviewed: 07/06/2013 ExitCare Patient Information 2015 [employer]. This information is not intended to replace advice given to you by your health care provider. Make sure you discuss any questions you have with your health care provider.  

## 2014-04-24 NOTE — Progress Notes (Signed)
   Subjective:    Patient ID: Albert Mcdonald, male    DOB: 04/23/1971, 43 y.o.   MRN: [id number]  Hypertension This is a recurrent problem. The current episode started more than 1 month ago. The problem has been waxing and waning since onset. The problem is uncontrolled. Associated symptoms include anxiety. Pertinent negatives include no blurred vision, headaches, palpitations, peripheral edema or shortness of breath. Risk factors for coronary artery disease include dyslipidemia, male gender and family history. Past treatments include diuretics. The current treatment provides no improvement. There is no history of kidney disease, CAD/MI or heart failure.      Review of Systems  Constitutional: Negative.   HENT: Negative.   Eyes: Negative for blurred vision.  Respiratory: Negative.  Negative for shortness of breath.   Cardiovascular: Negative.  Negative for palpitations.  Gastrointestinal: Negative.   Endocrine: Negative.   Genitourinary: Negative.   Musculoskeletal: Negative.   Neurological: Negative.  Negative for headaches.  Hematological: Negative.   Psychiatric/Behavioral: Negative.   All other systems reviewed and are negative.      Objective:   Physical Exam  Vitals reviewed. Constitutional: He is oriented to person, place, and time. He appears well-developed and well-nourished. No distress.  HENT:  Head: Normocephalic.  Right Ear: External ear normal.  Left Ear: External ear normal.  Mouth/Throat: Oropharynx is clear and moist.  Eyes: Pupils are equal, round, and reactive to light. Right eye exhibits no discharge. Left eye exhibits no discharge.  Neck: Normal range of motion. Neck supple. No thyromegaly present.  Cardiovascular: Normal rate, regular rhythm, normal heart sounds and intact distal pulses.   No murmur heard. Pulmonary/Chest: Effort normal and breath sounds normal. No respiratory distress. He has no wheezes.  Abdominal: Soft. Bowel sounds are normal. He  exhibits no distension. There is no tenderness.  Musculoskeletal: Normal range of motion. He exhibits no edema and no tenderness.  Neurological: He is alert and oriented to person, place, and time. He has normal reflexes. No cranial nerve deficit.  Skin: Skin is warm and dry. No rash noted. No erythema.  Psychiatric: He has a normal mood and affect. His behavior is normal. Judgment and thought content normal.    BP 179/118  Pulse 78  Temp(Src) 97.6 F (36.4 C) (Oral)  Ht 6\' 1"  (1.854 m)  Wt 228 lb 9.6 oz (103.692 kg)  BMI 30.17 kg/m2       Assessment & Plan:  1. Essential hypertension --Daily blood pressure log given with instructions on how to fill out and told to bring to next visit -Dash diet information given -Exercise encouraged - Stress Management  -Continue current meds -Pt started on Cazaar 100mg  daily -Pt told to take BP at home if bp is >_ 200/100 to call our office or go to hospital -Pt to report any headaches, SOB, chest pain, or edema -RTO in 1 week - losartan (COZAAR) 100 MG tablet; Take 1 tablet (100 mg total) by mouth daily.  Dispense: 90 tablet; Refill: 3  2. Hyperlipidemia -low fat diet - pravastatin (PRAVACHOL) 40 MG tablet; Take 1 tablet (40 mg total) by mouth daily.  Dispense: 90 tablet; Refill: [place], FNP

## 2014-04-24 NOTE — Telephone Encounter (Signed)
APPOINTMENT MADE °

## 2014-05-02 ENCOUNTER — Ambulatory Visit: Payer: Self-pay | Admitting: Family

## 2014-05-13 ENCOUNTER — Ambulatory Visit (INDEPENDENT_AMBULATORY_CARE_PROVIDER_SITE_OTHER): Payer: Medicaid Other | Admitting: Neurology

## 2014-05-13 ENCOUNTER — Encounter: Payer: Self-pay | Admitting: Neurology

## 2014-05-13 VITALS — BP 158/88 | HR 76 | Resp 16 | Ht 73.0 in | Wt 234.0 lb

## 2014-05-13 DIAGNOSIS — F429 Obsessive-compulsive disorder, unspecified: Secondary | ICD-10-CM

## 2014-05-13 DIAGNOSIS — G9689 Other specified disorders of central nervous system: Secondary | ICD-10-CM

## 2014-05-13 DIAGNOSIS — R259 Unspecified abnormal involuntary movements: Secondary | ICD-10-CM

## 2014-05-13 DIAGNOSIS — G4713 Recurrent hypersomnia: Secondary | ICD-10-CM

## 2014-05-13 DIAGNOSIS — G968 Other specified disorders of central nervous system: Secondary | ICD-10-CM

## 2014-05-13 DIAGNOSIS — G988 Other disorders of nervous system: Secondary | ICD-10-CM
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
## 2014-05-13 NOTE — Patient Instructions (Addendum)
1. Your provider has requested that you have labwork completed today. Please go to [facility] on the first floor of this building before leaving the office today. 2. We have scheduled you at [facility] for your MRI on 05/14/14 at 1:00pm. Please arrive 30 minutes prior.

## 2014-05-14 LAB — HIV ANTIBODY (ROUTINE TESTING W REFLEX): HIV 1&2 Ab, 4th Generation: NONREACTIVE

## 2014-05-14 LAB — GLIADIN ANTIBODIES, SERUM
GLIADIN IGG: 22.4 U/mL — AB (ref <20)
Gliadin IgA: 8.3 U/mL (ref <20)

## 2014-05-14 LAB — SYSTEMIC LUPUS PANEL-COMPREHENSIVE
ENA SM Ab Ser-aCnc: <1
JO-1 ANTIBODY, IGG: NEGATIVE
SCLERODERMA (SCL-70) (ENA) ANTIBODY, IGG: NEGATIVE
SM/RNP: NEGATIVE
SSA (Ro) (ENA) Antibody, IgG: <1
SSB (La) (ENA) Antibody, IgG: <1

## 2014-05-15 ENCOUNTER — Ambulatory Visit (HOSPITAL_COMMUNITY)
Admission: RE | Admit: 2014-05-15 | Discharge: 2014-05-15 | Disposition: A | Discharge status: Home / Self Care | Payer: Medicaid Other | Source: Ambulatory Visit | Attending: Neurology | Admitting: Neurology

## 2014-05-15 DIAGNOSIS — D1779 Benign lipomatous neoplasm of other sites: Secondary | ICD-10-CM | POA: Insufficient documentation

## 2014-05-15 DIAGNOSIS — R259 Unspecified abnormal involuntary movements: Secondary | ICD-10-CM | POA: Diagnosis not present

## 2014-05-15 DIAGNOSIS — G9689 Other specified disorders of central nervous system: Secondary | ICD-10-CM

## 2014-05-15 DIAGNOSIS — G968 Other specified disorders of central nervous system: Secondary | ICD-10-CM

## 2014-05-15 LAB — CERULOPLASMIN: Ceruloplasmin: 21 mg/dL (ref 18–36)

## 2014-05-15 LAB — COPPER, SERUM: Copper: 79 ug/dL (ref 70–175)

## 2014-05-15 MED ORDER — GADOBENATE DIMEGLUMINE 529 MG/ML IV SOLN
20.0000 mL | Freq: Once | INTRAVENOUS | Status: AC | PRN
  Administered 2014-05-15: 20 mL via INTRAVENOUS

## 2014-05-16 ENCOUNTER — Telehealth: Payer: Self-pay | Admitting: Neurology

## 2014-05-16 NOTE — Telephone Encounter (Signed)
Message copied by Annamaria Helling on Thu May 16, 2014  9:57 AM ------      Message from: TAT, [place]: Thu May 16, 2014  9:25 AM       Movement artifact noted but otherwise neg.  Euclide Granito, please let patient know that MRI looked ok ------

## 2014-05-16 NOTE — Telephone Encounter (Signed)
Tried to call patient to make him aware but phone was picked up and hung up - will try again later.

## 2014-05-16 NOTE — Telephone Encounter (Signed)
Patient made aware that MR looked okay. He will call with any questions.

## 2014-05-21 ENCOUNTER — Ambulatory Visit (HOSPITAL_COMMUNITY): Payer: Medicaid Other

## 2014-05-22 ENCOUNTER — Ambulatory Visit: Payer: Medicaid Other | Admitting: Family

## 2014-05-28 ENCOUNTER — Ambulatory Visit: Payer: Medicaid Other | Admitting: Family

## 2014-06-20 ENCOUNTER — Ambulatory Visit: Payer: Medicaid Other | Admitting: Family

## 2014-07-25 ENCOUNTER — Ambulatory Visit: Payer: Medicaid Other | Admitting: Family Medicine

## 2014-07-29 ENCOUNTER — Telehealth: Payer: Self-pay | Admitting: Neurology

## 2014-07-29 NOTE — Telephone Encounter (Signed)
Let pt know that I did get old [facility] records but not a lot of neurology records present from [place].  If he is still having the abnormal movements, make f/u to discuss options

## 2014-07-29 NOTE — Telephone Encounter (Signed)
Still having movements. Appt made for follow up.

## 2014-08-13 ENCOUNTER — Ambulatory Visit: Payer: Medicaid Other | Admitting: Neurology

## 2014-08-20 ENCOUNTER — Ambulatory Visit: Payer: Medicaid Other | Admitting: Neurology

## 2014-08-21 ENCOUNTER — Telehealth: Payer: Self-pay | Admitting: Neurology

## 2014-08-21 NOTE — Telephone Encounter (Signed)
Pt no showed 08/20/14 appt w/ Dr. Carles Collet.  Danae Chen - please send no show letter / Venida Jarvis

## 2014-08-27 ENCOUNTER — Encounter: Payer: Self-pay | Admitting: *Deleted

## 2014-08-27 NOTE — Progress Notes (Signed)
No show letter sent for 08/20/2014

## 2015-05-16 ENCOUNTER — Ambulatory Visit: Payer: Medicaid Other | Admitting: Family

## 2015-05-22 ENCOUNTER — Ambulatory Visit: Payer: Medicaid Other | Admitting: Family

## 2015-05-23 ENCOUNTER — Encounter: Payer: Self-pay | Admitting: Family

## 2015-06-10 ENCOUNTER — Ambulatory Visit (INDEPENDENT_AMBULATORY_CARE_PROVIDER_SITE_OTHER): Payer: Medicaid Other | Admitting: Nurse Practitioner

## 2015-06-10 ENCOUNTER — Encounter: Payer: Self-pay | Admitting: Nurse Practitioner

## 2015-06-10 VITALS — BP 160/90 | HR 101 | Temp 96.9°F | Ht 73.0 in | Wt 222.0 lb

## 2015-06-10 DIAGNOSIS — R1030 Lower abdominal pain, unspecified: Secondary | ICD-10-CM

## 2015-06-10 DIAGNOSIS — R1031 Right lower quadrant pain: Secondary | ICD-10-CM

## 2015-06-10 NOTE — Progress Notes (Signed)
   Subjective:    Patient ID: Albert Mcdonald, male    DOB: 08-20-1971, 44 y.o.   MRN: [id number]  HPI Patient in c/o right groin pain that started 2 weeks ago- pain has resolved and is now just a dull ache. Does not feel any knots or anything- He says that the pain occasionlly runs down the front of his right leg. Sitting for long periods of time increases pain    Review of Systems  Constitutional: Negative.   HENT: Negative.   Respiratory: Negative.   Cardiovascular: Negative.   Genitourinary: Negative.   Musculoskeletal: Negative.   Neurological: Negative.   Psychiatric/Behavioral: Negative.   All other systems reviewed and are negative.      Objective:   Physical Exam  Constitutional: He is oriented to person, place, and time. He appears well-developed and well-nourished.  Cardiovascular: Normal rate, regular rhythm and normal heart sounds.   Pulmonary/Chest: Effort normal and breath sounds normal.  Abdominal:  Right groin pain on palpation- no mass palpable  Neurological: He is alert and oriented to person, place, and time.  Skin: Skin is warm and dry.  Psychiatric: He has a normal mood and affect. His behavior is normal. Judgment and thought content normal.   BP 160/90 mmHg  Pulse 101  Temp(Src) 96.9 F (36.1 C) (Oral)  Ht 6\' 1"  (1.854 m)  Wt 222 lb (100.699 kg)  BMI 29.30 kg/m2         Assessment & Plan:  1. Right groin pain RTO if pain worsens prior to referral Avoid any heavy lifting - Ambulatory referral to [place], [place]

## 2015-06-10 NOTE — Patient Instructions (Signed)

## 2015-08-12 ENCOUNTER — Telehealth: Payer: Self-pay | Admitting: Family

## 2015-10-15 ENCOUNTER — Telehealth: Payer: Self-pay | Admitting: Family

## 2015-12-29 ENCOUNTER — Telehealth: Payer: Self-pay | Admitting: Family

## 2016-04-05 ENCOUNTER — Telehealth: Payer: Self-pay | Admitting: Family

## 2016-11-15 ENCOUNTER — Other Ambulatory Visit (HOSPITAL_COMMUNITY): Payer: Self-pay | Admitting: Family Medicine

## 2016-11-15 ENCOUNTER — Ambulatory Visit (HOSPITAL_COMMUNITY)
Admission: RE | Admit: 2016-11-15 | Discharge: 2016-11-15 | Disposition: A | Discharge status: Home / Self Care | Payer: Medicaid Other | Source: Ambulatory Visit | Attending: Family Medicine | Admitting: Family Medicine

## 2016-11-15 DIAGNOSIS — R52 Pain, unspecified: Secondary | ICD-10-CM

## 2016-11-15 DIAGNOSIS — M545 Low back pain: Secondary | ICD-10-CM | POA: Diagnosis present

## 2016-11-15 DIAGNOSIS — M5136 Other intervertebral disc degeneration, lumbar region: Secondary | ICD-10-CM | POA: Insufficient documentation

## 2016-11-15 DIAGNOSIS — M48061 Spinal stenosis, lumbar region without neurogenic claudication: Secondary | ICD-10-CM | POA: Insufficient documentation

## 2016-11-15 DIAGNOSIS — M2578 Osteophyte, vertebrae: Secondary | ICD-10-CM | POA: Diagnosis not present

## 2017-03-14 ENCOUNTER — Encounter (HOSPITAL_COMMUNITY): Payer: Self-pay | Admitting: Emergency Medicine

## 2017-03-14 ENCOUNTER — Emergency Department (HOSPITAL_COMMUNITY): Payer: Medicare Other

## 2017-03-14 ENCOUNTER — Inpatient Hospital Stay (HOSPITAL_COMMUNITY)
Admission: EM | Admit: 2017-03-14 | Discharge: 2017-03-16 | DRG: 917 | Disposition: A | Discharge status: Rehabilitation Facility | Payer: Medicare Other | Attending: Internal Medicine | Admitting: Internal Medicine

## 2017-03-14 DIAGNOSIS — R4182 Altered mental status, unspecified: Secondary | ICD-10-CM | POA: Diagnosis not present

## 2017-03-14 DIAGNOSIS — Z888 Allergy status to other drugs, medicaments and biological substances status: Secondary | ICD-10-CM

## 2017-03-14 DIAGNOSIS — G9340 Encephalopathy, unspecified: Secondary | ICD-10-CM | POA: Diagnosis present

## 2017-03-14 DIAGNOSIS — E876 Hypokalemia: Secondary | ICD-10-CM | POA: Diagnosis present

## 2017-03-14 DIAGNOSIS — F1410 Cocaine abuse, uncomplicated: Secondary | ICD-10-CM | POA: Diagnosis not present

## 2017-03-14 DIAGNOSIS — G4713 Recurrent hypersomnia: Secondary | ICD-10-CM | POA: Diagnosis present

## 2017-03-14 DIAGNOSIS — Z981 Arthrodesis status: Secondary | ICD-10-CM

## 2017-03-14 DIAGNOSIS — I10 Essential (primary) hypertension: Secondary | ICD-10-CM | POA: Diagnosis present

## 2017-03-14 DIAGNOSIS — T50904A Poisoning by unspecified drugs, medicaments and biological substances, undetermined, initial encounter: Secondary | ICD-10-CM | POA: Diagnosis not present

## 2017-03-14 DIAGNOSIS — G9341 Metabolic encephalopathy: Secondary | ICD-10-CM | POA: Diagnosis not present

## 2017-03-14 DIAGNOSIS — R4781 Slurred speech: Secondary | ICD-10-CM

## 2017-03-14 DIAGNOSIS — Z9119 Patient's noncompliance with other medical treatment and regimen: Secondary | ICD-10-CM

## 2017-03-14 DIAGNOSIS — T405X2A Poisoning by cocaine, intentional self-harm, initial encounter: Principal | ICD-10-CM | POA: Diagnosis present

## 2017-03-14 DIAGNOSIS — D1779 Benign lipomatous neoplasm of other sites: Secondary | ICD-10-CM | POA: Diagnosis present

## 2017-03-14 DIAGNOSIS — T6591XA Toxic effect of unspecified substance, accidental (unintentional), initial encounter: Secondary | ICD-10-CM

## 2017-03-14 DIAGNOSIS — R410 Disorientation, unspecified: Secondary | ICD-10-CM

## 2017-03-14 DIAGNOSIS — E785 Hyperlipidemia, unspecified: Secondary | ICD-10-CM | POA: Diagnosis present

## 2017-03-14 DIAGNOSIS — T50901A Poisoning by unspecified drugs, medicaments and biological substances, accidental (unintentional), initial encounter: Secondary | ICD-10-CM | POA: Diagnosis present

## 2017-03-14 DIAGNOSIS — T50904D Poisoning by unspecified drugs, medicaments and biological substances, undetermined, subsequent encounter: Secondary | ICD-10-CM | POA: Diagnosis not present

## 2017-03-14 DIAGNOSIS — G2581 Restless legs syndrome: Secondary | ICD-10-CM | POA: Diagnosis present

## 2017-03-14 DIAGNOSIS — F1910 Other psychoactive substance abuse, uncomplicated: Secondary | ICD-10-CM | POA: Diagnosis present

## 2017-03-14 DIAGNOSIS — E119 Type 2 diabetes mellitus without complications: Secondary | ICD-10-CM | POA: Diagnosis present

## 2017-03-14 DIAGNOSIS — E782 Mixed hyperlipidemia: Secondary | ICD-10-CM | POA: Diagnosis present

## 2017-03-14 DIAGNOSIS — F429 Obsessive-compulsive disorder, unspecified: Secondary | ICD-10-CM | POA: Diagnosis present

## 2017-03-14 DIAGNOSIS — G92 Toxic encephalopathy: Secondary | ICD-10-CM | POA: Diagnosis present

## 2017-03-14 DIAGNOSIS — F840 Autistic disorder: Secondary | ICD-10-CM | POA: Diagnosis present

## 2017-03-14 LAB — CBC WITH DIFFERENTIAL/PLATELET
Basophils Absolute: 0 10*3/uL (ref 0.0–0.1)
Basophils Relative: 0 %
EOS ABS: 0 10*3/uL (ref 0.0–0.7)
EOS PCT: 0 %
HCT: 42.5 % (ref 39.0–52.0)
Hemoglobin: 15.1 g/dL (ref 13.0–17.0)
LYMPHS ABS: 2.3 10*3/uL (ref 0.7–4.0)
Lymphocytes Relative: 20 %
MCH: 30.2 pg (ref 26.0–34.0)
MCHC: 35.5 g/dL (ref 30.0–36.0)
MCV: 85 fL (ref 78.0–100.0)
MONOS PCT: 5 %
Monocytes Absolute: 0.6 10*3/uL (ref 0.1–1.0)
NEUTROS PCT: 75 %
Neutro Abs: 8.5 10*3/uL — ABNORMAL HIGH (ref 1.7–7.7)
PLATELETS: 177 10*3/uL (ref 150–400)
RBC: 5 MIL/uL (ref 4.22–5.81)
RDW: 12.4 % (ref 11.5–15.5)
WBC: 11.4 10*3/uL — ABNORMAL HIGH (ref 4.0–10.5)

## 2017-03-14 LAB — URINALYSIS, ROUTINE W REFLEX MICROSCOPIC
Bacteria, UA: NONE SEEN
Bilirubin Urine: NEGATIVE
GLUCOSE, UA: 50 mg/dL — AB
HGB URINE DIPSTICK: NEGATIVE
Ketones, ur: 20 mg/dL — AB
Leukocytes, UA: NEGATIVE
NITRITE: NEGATIVE
PH: 6 (ref 5.0–8.0)
PROTEIN: 30 mg/dL — AB
SPECIFIC GRAVITY, URINE: 1.027 (ref 1.005–1.030)

## 2017-03-14 LAB — LITHIUM LEVEL: Lithium Lvl: <0.06 mmol/L — ABNORMAL LOW (ref 0.60–1.20)

## 2017-03-14 LAB — RAPID URINE DRUG SCREEN, HOSP PERFORMED
Amphetamines: NOT DETECTED
BARBITURATES: NOT DETECTED
Benzodiazepines: POSITIVE — AB
Cocaine: POSITIVE — AB
Opiates: NOT DETECTED
Tetrahydrocannabinol: NOT DETECTED

## 2017-03-14 LAB — COMPREHENSIVE METABOLIC PANEL
ALBUMIN: 4.9 g/dL (ref 3.5–5.0)
ALT: 26 U/L (ref 17–63)
AST: 35 U/L (ref 15–41)
Alkaline Phosphatase: 77 U/L (ref 38–126)
Anion gap: 10 (ref 5–15)
BUN: 13 mg/dL (ref 6–20)
CO2: 26 mmol/L (ref 22–32)
CREATININE: 0.74 mg/dL (ref 0.61–1.24)
Calcium: 9.4 mg/dL (ref 8.9–10.3)
Chloride: 108 mmol/L (ref 101–111)
GFR calc non Af Amer: >60 mL/min (ref >60)
GLUCOSE: 143 mg/dL — AB (ref 65–99)
Potassium: 3.2 mmol/L — ABNORMAL LOW (ref 3.5–5.1)
Sodium: 144 mmol/L (ref 135–145)
Total Bilirubin: 0.7 mg/dL (ref 0.3–1.2)
Total Protein: 7.5 g/dL (ref 6.5–8.1)

## 2017-03-14 LAB — LACTIC ACID, PLASMA: LACTIC ACID, VENOUS: 1.3 mmol/L (ref 0.5–1.9)

## 2017-03-14 LAB — ACETAMINOPHEN LEVEL

## 2017-03-14 LAB — ETHANOL: Alcohol, Ethyl (B): <5 mg/dL (ref <5)

## 2017-03-14 LAB — CBG MONITORING, ED: GLUCOSE-CAPILLARY: 144 mg/dL — AB (ref 65–99)

## 2017-03-14 LAB — SALICYLATE LEVEL: Salicylate Lvl: <7 mg/dL (ref 2.8–30.0)

## 2017-03-14 MED ORDER — LORAZEPAM 2 MG/ML IJ SOLN
1.0000 mg | Freq: Once | INTRAMUSCULAR | Status: AC
  Administered 2017-03-14: 1 mg via INTRAVENOUS
  Filled 2017-03-14: qty 1

## 2017-03-14 MED ORDER — LORAZEPAM 2 MG/ML IJ SOLN
2.0000 mg | Freq: Once | INTRAMUSCULAR | Status: AC
  Administered 2017-03-14: 2 mg via INTRAVENOUS
  Filled 2017-03-14: qty 1

## 2017-03-14 MED ORDER — ADULT MULTIVITAMIN W/MINERALS CH
1.0000 | ORAL_TABLET | Freq: Every day | ORAL | Status: DC
  Administered 2017-03-15 – 2017-03-16 (×2): 1 via ORAL
  Filled 2017-03-14 (×2): qty 1

## 2017-03-14 MED ORDER — VITAMIN B-1 100 MG PO TABS
100.0000 mg | ORAL_TABLET | Freq: Every day | ORAL | Status: DC
  Administered 2017-03-15 – 2017-03-16 (×2): 100 mg via ORAL
  Filled 2017-03-14 (×2): qty 1

## 2017-03-14 MED ORDER — ONDANSETRON HCL 4 MG PO TABS: 4.0000 mg | ORAL_TABLET | Freq: Four times a day (QID) | ORAL | Status: DC | PRN

## 2017-03-14 MED ORDER — FOLIC ACID 1 MG PO TABS
1.0000 mg | ORAL_TABLET | Freq: Every day | ORAL | Status: DC
  Administered 2017-03-15 – 2017-03-16 (×2): 1 mg via ORAL
  Filled 2017-03-14 (×2): qty 1

## 2017-03-14 MED ORDER — THIAMINE HCL 100 MG/ML IJ SOLN
Freq: Once | INTRAVENOUS | Status: AC
  Administered 2017-03-15: 01:00:00 via INTRAVENOUS
  Filled 2017-03-14: qty 1000

## 2017-03-14 MED ORDER — ONDANSETRON HCL 4 MG/2ML IJ SOLN: 4.0000 mg | Freq: Four times a day (QID) | INTRAMUSCULAR | Status: DC | PRN

## 2017-03-14 MED ORDER — INSULIN ASPART 100 UNIT/ML ~~LOC~~ SOLN
0.0000 [IU] | SUBCUTANEOUS | Status: DC
Start: 2017-03-15 — End: 2017-03-15
  Administered 2017-03-15 (×4): 1 [IU] via SUBCUTANEOUS
  Administered 2017-03-15: 2 [IU] via SUBCUTANEOUS

## 2017-03-14 NOTE — ED Provider Notes (Signed)
[facility] DEPT Provider Note   CSN: [id number] Arrival date & time: 03/14/17  1803     History   Chief Complaint Chief Complaint  Patient presents with  . Ingestion    HPI Albert Mcdonald is a 46 y.o. male. Level V caveat secondary to altered mental status HPI  46 year old male with autism who presents today with ingestion. Historian is patient's sister. She states that he was well on Friday night and told her that he took some of his mother's medication. He has been agitated and it is difficult to get history from him. Patient has stool on him. He lives alone but does not drive.  Mother states he has a history of substance abuse and has had money recently and it appears to be missing. She states that he did have some of her muscle relaxants in the past.  Past Medical History:  Diagnosis Date  . ALS (amyotrophic lateral sclerosis) ([place])    patient denied on 11/01/12 -- has KLS not ALS  . Diabetes mellitus   . Family history of anesthesia complication   . Hyperlipidemia   . Hypertension   . Kleine-Levin syndrome   . OCD (obsessive compulsive disorder)   . Restless leg     Patient Active Problem List   Diagnosis Date Noted  . Type II or unspecified type diabetes mellitus with unspecified complication, not stated as uncontrolled   . Hyperlipidemia   . Hypertension   . Restless leg   . Family history of anesthesia complication   . Cervical spondylosis with myelopathy 09/29/2012  . OCD (obsessive compulsive disorder) 03/16/2011  . 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 HLD (hyperlipidemia) 03/16/2011    Past Surgical History:  Procedure Laterality Date  . ANTERIOR CERVICAL DECOMP/DISCECTOMY FUSION  11/09/2012   Procedure: ANTERIOR CERVICAL DECOMPRESSION/DISCECTOMY FUSION 3 LEVELS C4-C7 ;  Surgeon: Melina Schools, MD;  Location: [facility];  Service: Orthopedics;;  ACDF C4-C7        Home Medications    Prior to Admission medications   Medication Sig Start Date End  Date Taking? Authorizing Provider  hydrochlorothiazide (HYDRODIURIL) 25 MG tablet Take 1 tablet (25 mg total) by mouth daily. Patient not taking: Reported on 06/10/2015 04/09/14   Evelina Dun A, FNP  ibuprofen (ADVIL,MOTRIN) 200 MG tablet Take 800 mg by mouth every 6 (six) hours as needed for mild pain.    [provider]    Family History History reviewed. No pertinent family history.  Social History Social History  Substance Use Topics  . Smoking status: Never Smoker  . Smokeless tobacco: Never Used     Comment: pt states addicted to drugs --cocaine  states last snort was last month  . Alcohol use Yes     Comment: once every couple months     Allergies   Ace inhibitors   Review of Systems Review of Systems   Physical Exam Updated Vital Signs BP (!) 149/133 (BP Location: Left Arm)   Pulse 84   Temp 98.4 F (36.9 C) (Oral)   Resp 18   Ht 1.829 m (6')   Wt 99.8 kg (220 lb)   SpO2 96%   BMI 29.84 kg/m   Physical Exam  Constitutional: He appears well-nourished. No distress.  HENT:  Head: Normocephalic and atraumatic.  Right Ear: External ear normal.  Left Ear: External ear normal.  Mouth/Throat: Oropharynx is clear and moist.  Eyes: Pupils are equal, round, and reactive to light.  Neck: Normal range of motion.  Cardiovascular:  Normal rate.   Pulmonary/Chest: Effort normal.  Abdominal: Soft.  Musculoskeletal: Normal range of motion.  Neurological:  Patient opens eyes and follows instructions. He appears agitated and intermittently is on bed with eyes closed. He has not responded verbally. He is moving all extremities well.  Skin: Skin is warm and dry. Capillary refill takes less than 2 seconds. There is erythema.  Nursing note and vitals reviewed.    ED Treatments / Results  Labs (all labs ordered are listed, but only abnormal results are displayed) Labs Reviewed  ETHANOL  SALICYLATE LEVEL  CBC WITH DIFFERENTIAL/PLATELET  COMPREHENSIVE  METABOLIC PANEL  RAPID URINE DRUG SCREEN, HOSP PERFORMED  ACETAMINOPHEN LEVEL  LITHIUM LEVEL  CBG MONITORING, ED    EKG  EKG Interpretation  Date/Time:  Monday March 14 2017 20:15:29 EDT Ventricular Rate:  76 PR Interval:    QRS Duration: 108 QT Interval:  395 QTC Calculation: 445 R Axis:   10 Text Interpretation:  Sinus rhythm Probable left ventricular hypertrophy Non-specific ST-t changes Confirmed by Pattricia Boss [phone]) on 03/14/2017 8:32:16 PM       Radiology No results found.  Procedures Procedures (including critical care time)  Medications Ordered in ED Medications  LORazepam (ATIVAN) injection 1 mg (not administered)     Initial Impression / Assessment and Plan / ED Course  I have reviewed the triage vital signs and the nursing notes.  Pertinent labs & imaging results that were available during my care of the patient were reviewed by me and considered in my medical decision making (see chart for details).     Patient with urine drug screen positive for cocaine and benzos. Benzos given here. Patient heart rate is currently in the 70s. Blood pressure currently at 150/117. Will repeat Ativan dose. Patient continues intermittently agitated. He has not verbalized here. And admission for further evaluation and treatment. Salicylates, Tylenol, and lithium are not elevated. Doubt infection- cxr, ua, normal.  Likely toxic with cocaine as irritant, but consider other tox etiologies.  Benzos given here for agitation. Discussed with Dr. Shanon Brow and will see.   CRITICAL CARE Performed by: Shaune Pollack Total critical care time: 45 minutes Critical care time was exclusive of separately billable procedures and treating other patients. Critical care was necessary to treat or prevent imminent or life-threatening deterioration. Critical care was time spent personally by me on the following activities: development of treatment plan with patient and/or surrogate as well as  nursing, discussions with consultants, evaluation of patient's response to treatment, examination of patient, obtaining history from patient or surrogate, ordering and performing treatments and interventions, ordering and review of laboratory studies, ordering and review of radiographic studies, pulse oximetry and re-evaluation of patient's condition.   Final Clinical Impressions(s) / ED Diagnoses   Final diagnoses:  Delirium  Cocaine abuse    New Prescriptions New Prescriptions   No medications on file     Pattricia Boss, MD 03/14/17 2122

## 2017-03-14 NOTE — H&P (Signed)
History and Physical    Albert Mcdonald CBJ:[id number] DOB: Aug 25, 1971 DOA: 03/14/2017  PCP: Lemmie Evens, MD  Patient coming from:  home  Chief Complaint:   Altered mental status  HPI: Albert Mcdonald is a 46 y.o. male with medical history significant of autism, DM, HLD, OCD, polysubstance abuse lives alone with family checking on him frequently.  All history obtained from ED staff and chart.  Pt normally speaks to his mom twice a day, she had not heard from him in over 24 hours so she went to check on him at his apartment and he was found confused more than normal so therefore he was brought to the ED.   Family is unaware if he took anything illegally.  In ED he became agitated and combative so was given some ativan, he is now sedated.  Pt referred for admission for his ams.    Review of Systems: unobtainable from pt due to AMS  Past Medical History:  Diagnosis Date  . ALS (amyotrophic lateral sclerosis) ([place])    patient denied on 11/01/12 -- has KLS not ALS  . Diabetes mellitus   . Family history of anesthesia complication   . Hyperlipidemia   . Hypertension   . Kleine-Levin syndrome   . OCD (obsessive compulsive disorder)   . Restless leg     Past Surgical History:  Procedure Laterality Date  . ANTERIOR CERVICAL DECOMP/DISCECTOMY FUSION  11/09/2012   Procedure: ANTERIOR CERVICAL DECOMPRESSION/DISCECTOMY FUSION 3 LEVELS C4-C7 ;  Surgeon: Melina Schools, MD;  Location: [place];  Service: Orthopedics;;  ACDF C4-C7      reports that he has never smoked. He has never used smokeless tobacco. He reports that he drinks alcohol. He reports that he does not use drugs.  Allergies  Allergen Reactions  . Ace Inhibitors Rash    History reviewed. No pertinent family history. no premature CAD  Prior to Admission medications   Medication Sig Start Date End Date Taking? Authorizing Provider  ibuprofen (ADVIL,MOTRIN) 200 MG tablet Take 800 mg by mouth every 6 (six) hours as needed  for mild pain.    [provider]    Physical Exam: Vitals:   03/14/17 1806 03/14/17 1807 03/14/17 2020 03/14/17 2112  BP: (!) 149/133   (!) 150/117  Pulse: 84   70  Resp: 18   17  Temp: 98.4 F (36.9 C)  99.3 F (37.4 C)   TempSrc: Oral  Rectal   SpO2: 96%   99%  Weight: 108.9 kg (240 lb) 99.8 kg (220 lb)    Height: 6\' 1"  (1.854 m) 6' (1.829 m)       Constitutional: NAD, calm, comfortable, sedated, with good color clearing his own secretions, responds to voice easily Vitals:   03/14/17 1806 03/14/17 1807 03/14/17 2020 03/14/17 2112  BP: (!) 149/133   (!) 150/117  Pulse: 84   70  Resp: 18   17  Temp: 98.4 F (36.9 C)  99.3 F (37.4 C)   TempSrc: Oral  Rectal   SpO2: 96%   99%  Weight: 108.9 kg (240 lb) 99.8 kg (220 lb)    Height: 6\' 1"  (1.854 m) 6' (1.829 m)     Eyes: PERRL, lids and conjunctivae normal ENMT: Mucous membranes are moist. Posterior pharynx clear of any exudate or lesions.Normal dentition.  Neck: normal, supple, no masses, no thyromegaly Respiratory: clear to auscultation bilaterally, no wheezing, no crackles. Normal respiratory effort. No accessory muscle use.  Cardiovascular: Regular rate and  rhythm, no murmurs / rubs / gallops. No extremity edema. 2+ pedal pulses. No carotid bruits.  Abdomen: no tenderness, no masses palpated. No hepatosplenomegaly. Bowel sounds positive.  Musculoskeletal: no clubbing / cyanosis. No joint deformity upper and lower extremities. Good ROM, no contractures. Normal muscle tone.  Skin: no rashes, lesions, ulcers. No induration Neurologic:  CN grossly intact, MAE spontaneously.  Cannot follow any commands Psychiatric: sedated  Labs on Admission: I have personally reviewed following labs and imaging studies  CBC:  Recent Labs Lab 03/14/17 2029  WBC 11.4*  NEUTROABS 8.5*  HGB 15.1  HCT 42.5  MCV 85.0  PLT 892   Basic Metabolic Panel:  Recent Labs Lab 03/14/17 1914  NA 144  K 3.2*  CL 108  CO2 26    GLUCOSE 143*  BUN 13  CREATININE 0.74  CALCIUM 9.4   GFR: Estimated Creatinine Clearance: 142.7 mL/min (by C-G formula based on SCr of 0.74 mg/dL). Liver Function Tests:  Recent Labs Lab 03/14/17 1914  AST 35  ALT 26  ALKPHOS 77  BILITOT 0.7  PROT 7.5  ALBUMIN 4.9   CBG:  Recent Labs Lab 03/14/17 2026  GLUCAP 144*   Urine analysis:    Component Value Date/Time   COLORURINE YELLOW 03/14/2017 2022   APPEARANCEUR CLEAR 03/14/2017 2022   LABSPEC 1.027 03/14/2017 2022   PHURINE 6.0 03/14/2017 2022   GLUCOSEU 50 (A) 03/14/2017 2022   HGBUR NEGATIVE 03/14/2017 2022   BILIRUBINUR NEGATIVE 03/14/2017 2022   KETONESUR 20 (A) 03/14/2017 2022   PROTEINUR 30 (A) 03/14/2017 2022   UROBILINOGEN 1.0 10/28/2009 0030   NITRITE NEGATIVE 03/14/2017 2022   LEUKOCYTESUR NEGATIVE 03/14/2017 2022    Radiological Exams on Admission: Dg Chest 1 View  Result Date: 03/14/2017 CLINICAL DATA:  Altered mental status EXAM: CHEST 1 VIEW COMPARISON:  November 05, 2013 FINDINGS: There is no edema or consolidation. Heart is borderline enlarged with pulmonary vascularity within normal limits. No adenopathy. There is postoperative change in the lower cervical region. IMPRESSION: Heart borderline enlarged.  No edema or consolidation. Electronically Signed   By: Lowella Grip III M.D.   On: 03/14/2017 20:50   Ct Head Wo Contrast  Result Date: 03/14/2017 CLINICAL DATA:  Altered mental status with questionable drug overdose EXAM: CT HEAD WITHOUT CONTRAST TECHNIQUE: Contiguous axial images were obtained from the base of the skull through the vertex without intravenous contrast. COMPARISON:  Brain MRI May 15, 2014 FINDINGS: Note that there is motion artifact making this study less than optimal. Brain: There is a stable lipoma measuring 8 x 5 mm located immediately posterior to the right quadrigeminal plate cistern. There is no other evident mass. There is no hemorrhage, extra-axial fluid collection, or  midline shift. Gray-white compartments appear unremarkable. No acute infarct evident. Vascular: No hyperdense vessel.  No evident vascular calcification. Skull: Bony calvarium appears intact. Sinuses/Orbits: Opacification is noted in multiple ethmoid air cells. Other visualized paranasal sinuses are clear. No intraorbital lesions. Other: Mastoid air cells are clear. IMPRESSION: 8 x 5 mm stable lipoma immediately posterior to the right quadrigeminal plate cistern, stable. No other mass. No hemorrhage, extra-axial fluid collection, or midline shift. Gray-white compartments appear normal. Note that there is a degree of degradation due to motion with this study. Ethmoid air cell disease bilaterally noted. Electronically Signed   By: Lowella Grip III M.D.   On: 03/14/2017 21:01    EKG: Independently reviewed. nsr cxr reviewed no edema or infiltrate Old records reviewed Case discussed with  dr ray  Assessment/Plan 46 yo male with altered mental status likely from drug usage  Principal Problem:   Acute metabolic encephalopathy- uds positive for cocaine and benzos but he got ativan in the ED and UDS was done after ativan given so unknown if this was present before presentation.  No report of etoh usage.  Place in stepdown overnight for close monitoring of any respiratory compromise.  None at this time.  No source of infection as a concern.  Lactic acid nml.  Ivf.  Place on bananna bag and give mvi, thiamine and folic acid as etoh is unclear but it is 0 today.  If pt does not improve over the next 12 to 15 hours consider further neurological work up.  Active Problems:   Drug overdose- cocaine, noted   Polysubstance abuse- unknown what substances he has abused in past   OCD (obsessive compulsive disorder)- noted   Kleine-Levin syndrome- noted   Hyperlipidemia- stable   Hypertension- stable     DVT prophylaxis:  scds Code Status:  full Family Communication:  none Disposition Plan:  Per day  team Consults called:  none Admission status:  observation   Serah Nicoletti A MD [facility]  If 7PM-7AM, please contact night-coverage [email or web address] Password [facility]  03/14/2017, 9:39 PM

## 2017-03-14 NOTE — ED Triage Notes (Addendum)
EMS reports they were called to pt's residence bc pt's sister states pt has AMS today and took some of his mother's medication today. PT states he did take some of his mother's medication today and isn't sure what he took. PT denies any SI/HI. PT alert and oriented on arrival to ED today. PT states he took the unknown medication this am. PT has dried fecal matter down bilateral legs and in his hands on arrival to ED.

## 2017-03-15 ENCOUNTER — Observation Stay (HOSPITAL_COMMUNITY): Payer: Medicare Other

## 2017-03-15 DIAGNOSIS — G9341 Metabolic encephalopathy: Secondary | ICD-10-CM

## 2017-03-15 DIAGNOSIS — F1410 Cocaine abuse, uncomplicated: Secondary | ICD-10-CM | POA: Diagnosis not present

## 2017-03-15 DIAGNOSIS — G9340 Encephalopathy, unspecified: Secondary | ICD-10-CM | POA: Diagnosis present

## 2017-03-15 DIAGNOSIS — Z981 Arthrodesis status: Secondary | ICD-10-CM | POA: Diagnosis not present

## 2017-03-15 DIAGNOSIS — F840 Autistic disorder: Secondary | ICD-10-CM | POA: Diagnosis present

## 2017-03-15 DIAGNOSIS — E876 Hypokalemia: Secondary | ICD-10-CM | POA: Diagnosis present

## 2017-03-15 DIAGNOSIS — G4713 Recurrent hypersomnia: Secondary | ICD-10-CM | POA: Diagnosis not present

## 2017-03-15 DIAGNOSIS — F1910 Other psychoactive substance abuse, uncomplicated: Secondary | ICD-10-CM | POA: Diagnosis not present

## 2017-03-15 DIAGNOSIS — Z888 Allergy status to other drugs, medicaments and biological substances status: Secondary | ICD-10-CM | POA: Diagnosis not present

## 2017-03-15 DIAGNOSIS — I10 Essential (primary) hypertension: Secondary | ICD-10-CM | POA: Diagnosis not present

## 2017-03-15 DIAGNOSIS — Z9119 Patient's noncompliance with other medical treatment and regimen: Secondary | ICD-10-CM | POA: Diagnosis not present

## 2017-03-15 DIAGNOSIS — E119 Type 2 diabetes mellitus without complications: Secondary | ICD-10-CM | POA: Diagnosis present

## 2017-03-15 DIAGNOSIS — T50904D Poisoning by unspecified drugs, medicaments and biological substances, undetermined, subsequent encounter: Secondary | ICD-10-CM

## 2017-03-15 DIAGNOSIS — R4781 Slurred speech: Secondary | ICD-10-CM | POA: Diagnosis not present

## 2017-03-15 DIAGNOSIS — D1779 Benign lipomatous neoplasm of other sites: Secondary | ICD-10-CM | POA: Diagnosis present

## 2017-03-15 DIAGNOSIS — E785 Hyperlipidemia, unspecified: Secondary | ICD-10-CM | POA: Diagnosis present

## 2017-03-15 DIAGNOSIS — T405X2A Poisoning by cocaine, intentional self-harm, initial encounter: Secondary | ICD-10-CM | POA: Diagnosis present

## 2017-03-15 DIAGNOSIS — G2581 Restless legs syndrome: Secondary | ICD-10-CM | POA: Diagnosis present

## 2017-03-15 DIAGNOSIS — F429 Obsessive-compulsive disorder, unspecified: Secondary | ICD-10-CM | POA: Diagnosis present

## 2017-03-15 DIAGNOSIS — G92 Toxic encephalopathy: Secondary | ICD-10-CM | POA: Diagnosis present

## 2017-03-15 LAB — TROPONIN I
Troponin I: <0.03 ng/mL (ref <0.03)
Troponin I: <0.03 ng/mL (ref <0.03)

## 2017-03-15 LAB — CBC
HEMATOCRIT: 43.6 % (ref 39.0–52.0)
Hemoglobin: 15.3 g/dL (ref 13.0–17.0)
MCH: 29.7 pg (ref 26.0–34.0)
MCHC: 35.1 g/dL (ref 30.0–36.0)
MCV: 84.7 fL (ref 78.0–100.0)
Platelets: 177 10*3/uL (ref 150–400)
RBC: 5.15 MIL/uL (ref 4.22–5.81)
RDW: 12.5 % (ref 11.5–15.5)
WBC: 8.4 10*3/uL (ref 4.0–10.5)

## 2017-03-15 LAB — AMMONIA: Ammonia: 20 umol/L (ref 9–35)

## 2017-03-15 LAB — BASIC METABOLIC PANEL
Anion gap: 8 (ref 5–15)
BUN: 14 mg/dL (ref 6–20)
CO2: 27 mmol/L (ref 22–32)
Calcium: 9.1 mg/dL (ref 8.9–10.3)
Chloride: 107 mmol/L (ref 101–111)
Creatinine, Ser: 0.79 mg/dL (ref 0.61–1.24)
GFR calc Af Amer: >60 mL/min (ref >60)
GFR calc non Af Amer: >60 mL/min (ref >60)
Glucose, Bld: 138 mg/dL — ABNORMAL HIGH (ref 65–99)
POTASSIUM: 3.3 mmol/L — AB (ref 3.5–5.1)
SODIUM: 142 mmol/L (ref 135–145)

## 2017-03-15 LAB — GLUCOSE, CAPILLARY
GLUCOSE-CAPILLARY: 130 mg/dL — AB (ref 65–99)
GLUCOSE-CAPILLARY: 154 mg/dL — AB (ref 65–99)
Glucose-Capillary: 132 mg/dL — ABNORMAL HIGH (ref 65–99)
Glucose-Capillary: 134 mg/dL — ABNORMAL HIGH (ref 65–99)
Glucose-Capillary: 131 mg/dL — ABNORMAL HIGH (ref 65–99)

## 2017-03-15 LAB — LACTIC ACID, PLASMA: LACTIC ACID, VENOUS: 1.1 mmol/L (ref 0.5–1.9)

## 2017-03-15 LAB — MRSA PCR SCREENING: MRSA BY PCR: NEGATIVE

## 2017-03-15 MED ORDER — HYDRALAZINE HCL 20 MG/ML IJ SOLN
10.0000 mg | INTRAMUSCULAR | Status: DC | PRN
  Administered 2017-03-15 (×2): 10 mg via INTRAVENOUS
  Filled 2017-03-15 (×2): qty 1

## 2017-03-15 MED ORDER — HALOPERIDOL LACTATE 5 MG/ML IJ SOLN
2.5000 mg | Freq: Four times a day (QID) | INTRAMUSCULAR | Status: DC | PRN
  Administered 2017-03-15: 2.5 mg via INTRAVENOUS
  Filled 2017-03-15: qty 1

## 2017-03-15 MED ORDER — POTASSIUM CHLORIDE CRYS ER 20 MEQ PO TBCR
40.0000 meq | EXTENDED_RELEASE_TABLET | Freq: Once | ORAL | Status: AC
  Administered 2017-03-15: 40 meq via ORAL
  Filled 2017-03-15: qty 2

## 2017-03-15 MED ORDER — FOLIC ACID 5 MG/ML IJ SOLN
INTRAMUSCULAR | Status: AC
  Filled 2017-03-15: qty 0.2

## 2017-03-15 MED ORDER — THIAMINE HCL 100 MG/ML IJ SOLN
INTRAMUSCULAR | Status: AC
  Filled 2017-03-15: qty 2

## 2017-03-15 MED ORDER — ENOXAPARIN SODIUM 40 MG/0.4ML ~~LOC~~ SOLN
40.0000 mg | SUBCUTANEOUS | Status: DC
Start: 2017-03-15 — End: 2017-03-16
  Administered 2017-03-15: 40 mg via SUBCUTANEOUS
  Filled 2017-03-15: qty 0.4

## 2017-03-15 MED ORDER — M.V.I. ADULT IV INJ
INJECTION | INTRAVENOUS | Status: AC
  Filled 2017-03-15: qty 10

## 2017-03-15 MED ORDER — INSULIN ASPART 100 UNIT/ML ~~LOC~~ SOLN
0.0000 [IU] | Freq: Three times a day (TID) | SUBCUTANEOUS | Status: DC
  Administered 2017-03-16: 1 [IU] via SUBCUTANEOUS
  Administered 2017-03-16: 2 [IU] via SUBCUTANEOUS

## 2017-03-15 NOTE — Progress Notes (Signed)
Patient more alert at this time, has an excellent appetite. Speech is slurred and per patient's mother this is new and a different speech pattern from his baseline. No c/o dysphagia, no coughing noted when eating. MD notified.

## 2017-03-15 NOTE — Progress Notes (Signed)
Patient more alert at this time and requesting to eat. A&O x 3, able to make needs known, able to sit up & reposition self at this time. Diet advanced to soft diet as ordered per MD.

## 2017-03-15 NOTE — Progress Notes (Signed)
PROGRESS NOTE    Albert Mcdonald  ZMO:[id number] DOB: 1970-12-17 DOA: 03/14/2017 PCP: Lemmie Evens, MD    Brief Narrative:  46 year old male with history of Kleine-Levin syndrome, hypertension, substance abuse, was brought to the hospital with altered mental status. Urine drug screen positive for cocaine. He was admitted to the hospital for further evaluation. Overall mental status has improved. Plans are for discharge to residential drug rehabilitation when a bed is available, hopefully in a.m .   Assessment & Plan:   Principal Problem:   Acute metabolic encephalopathy Active Problems:   OCD (obsessive compulsive disorder)   Kleine-Levin syndrome   Hyperlipidemia   Hypertension   Drug overdose   Polysubstance abuse   1. Acute encephalopathy . related to drugs that he had taken prior to arrival. Overall mental status has been improving, but his speech remains slurred. Ammonia is normal. Since he has been hypertensive and has cocaine in his system, we'll need to check MRI brain to rule out any underlying pathology. 2. Polysubstance abuse. Urine drug screen positive for cocaine. Patient admits to abusing drugs. He is interested in seeking assistance through drug rehabilitation. Social work is following and is making arrangements for patient to go to a residential rehabilitation program. This will likely occur in a.m . 3. Hypertension. Blood pressure has been running high. Supposedly, patient has been prescribed antihypertensives but has been noncompliant. Family has been requested to bring these medications and for review. Until then, we will use hydralazine when necessary. Avoid beta blockers with ongoing cocaine use. 4. Hypokalemia. Replace   DVT prophylaxis: Lovenox Code Status: Full code Family Communication: Discussed with mother at the bedside Disposition Plan: Discharge to drug rehabilitation once bed is available   Consultants:     Procedures:      Antimicrobials:      Subjective: Patient is stressed, more responsive. Denies any pain or shortness of breath.   Objective: Vitals:   03/14/17 2343 03/15/17 0000 03/15/17 0400 03/15/17 0500  BP:  (!) 162/116 125/81   Pulse:      Resp:  11 15   Temp: 98.7 F (37.1 C)  98.6 F (37 C)   TempSrc: Oral  Oral   SpO2:      Weight: 94.7 kg (208 lb 12.4 oz)   94.9 kg (209 lb 3.5 oz)  Height: 6' (1.829 m)      No intake or output data in the 24 hours ending 03/15/17 0809 Filed Weights   03/14/17 1807 03/14/17 2343 03/15/17 0500  Weight: 99.8 kg (220 lb) 94.7 kg (208 lb 12.4 oz) 94.9 kg (209 lb 3.5 oz)    Examination:  General exam: Appears calm and comfortable  Respiratory system: Clear to auscultation. Respiratory effort normal. Cardiovascular system: S1 & S2 heard, RRR. No JVD, murmurs, rubs, gallops or clicks. No pedal edema. Gastrointestinal system: Abdomen is nondistended, soft and nontender. No organomegaly or masses felt. Normal bowel sounds heard. Central nervous system: Speech is slurred, no motor deficits in extremities. No facial asymmetry Extremities: Symmetric 5 x 5 power. Skin: No rashes, lesions or ulcers Psychiatry: Somnolent     Data Reviewed: I have personally reviewed following labs and imaging studies  CBC:  Recent Labs Lab 03/14/17 2029 03/15/17 0556  WBC 11.4* 8.4  NEUTROABS 8.5*  --   HGB 15.1 15.3  HCT 42.5 43.6  MCV 85.0 84.7  PLT 177 035   Basic Metabolic Panel:  Recent Labs Lab 03/14/17 1914 03/15/17 0556  NA 144 142  K 3.2* 3.3*  CL 108 107  CO2 26 27  GLUCOSE 143* 138*  BUN 13 14  CREATININE 0.74 0.79  CALCIUM 9.4 9.1   GFR: Estimated Creatinine Clearance: 139.4 mL/min (by C-G formula based on SCr of 0.79 mg/dL). Liver Function Tests:  Recent Labs Lab 03/14/17 1914  AST 35  ALT 26  ALKPHOS 77  BILITOT 0.7  PROT 7.5  ALBUMIN 4.9   No results for input(s): LIPASE, AMYLASE in the last 168 hours. No results  for input(s): AMMONIA in the last 168 hours. Coagulation Profile: No results for input(s): INR, PROTIME in the last 168 hours. Cardiac Enzymes:  Recent Labs Lab 03/15/17 0035 03/15/17 0556  TROPONINI <0.03 <0.03   BNP (last 3 results) No results for input(s): PROBNP in the last 8760 hours. HbA1C: No results for input(s): HGBA1C in the last 72 hours. CBG:  Recent Labs Lab 03/14/17 2026 03/15/17 0015 03/15/17 0247 03/15/17 0739  GLUCAP 144* 130* 132* 134*   Lipid Profile: No results for input(s): CHOL, HDL, LDLCALC, TRIG, CHOLHDL, LDLDIRECT in the last 72 hours. Thyroid Function Tests: No results for input(s): TSH, T4TOTAL, FREET4, T3FREE, THYROIDAB in the last 72 hours. Anemia Panel: No results for input(s): VITAMINB12, FOLATE, FERRITIN, TIBC, IRON, RETICCTPCT in the last 72 hours. Sepsis Labs:  Recent Labs Lab 03/14/17 1939 03/15/17 0035  LATICACIDVEN 1.3 1.1    No results found for this or any previous visit (from the past 240 hour(s)).       Radiology Studies: Dg Chest 1 View  Result Date: 03/14/2017 CLINICAL DATA:  Altered mental status EXAM: CHEST 1 VIEW COMPARISON:  November 05, 2013 FINDINGS: There is no edema or consolidation. Heart is borderline enlarged with pulmonary vascularity within normal limits. No adenopathy. There is postoperative change in the lower cervical region. IMPRESSION: Heart borderline enlarged.  No edema or consolidation. Electronically Signed   By: Lowella Grip III M.D.   On: 03/14/2017 20:50   Ct Head Wo Contrast  Result Date: 03/14/2017 CLINICAL DATA:  Altered mental status with questionable drug overdose EXAM: CT HEAD WITHOUT CONTRAST TECHNIQUE: Contiguous axial images were obtained from the base of the skull through the vertex without intravenous contrast. COMPARISON:  Brain MRI May 15, 2014 FINDINGS: Note that there is motion artifact making this study less than optimal. Brain: There is a stable lipoma measuring 8 x 5 mm  located immediately posterior to the right quadrigeminal plate cistern. There is no other evident mass. There is no hemorrhage, extra-axial fluid collection, or midline shift. Gray-white compartments appear unremarkable. No acute infarct evident. Vascular: No hyperdense vessel.  No evident vascular calcification. Skull: Bony calvarium appears intact. Sinuses/Orbits: Opacification is noted in multiple ethmoid air cells. Other visualized paranasal sinuses are clear. No intraorbital lesions. Other: Mastoid air cells are clear. IMPRESSION: 8 x 5 mm stable lipoma immediately posterior to the right quadrigeminal plate cistern, stable. No other mass. No hemorrhage, extra-axial fluid collection, or midline shift. Gray-white compartments appear normal. Note that there is a degree of degradation due to motion with this study. Ethmoid air cell disease bilaterally noted. Electronically Signed   By: Lowella Grip III M.D.   On: 03/14/2017 21:01        Scheduled Meds: . folic acid  1 mg Oral Daily  . insulin aspart  0-9 Units Subcutaneous Q4H  . multivitamin with minerals  1 tablet Oral Daily  . thiamine  100 mg Oral Daily   Continuous Infusions:   LOS: 0 days  Time spent: 37mins    MEMON,JEHANZEB, MD [facility] Pager [phone]  If 7PM-7AM, please contact night-coverage [email or web address] Password [email or web address] 03/15/2017, 8:09 AM

## 2017-03-15 NOTE — Clinical Social Work Note (Signed)
Clinical Social Work Assessment  Patient Details  Name: Albert Mcdonald MRN: [id number] Date of Birth: [birth year]  Date of referral:  03/15/17               Reason for consult:  Substance Use/ETOH Abuse                Permission sought to share information with:    Permission granted to share information::     Name::        Agency::     Relationship::     Contact Information:  Mother, sister and significant other.  Housing/Transportation Living arrangements for the past 2 months:  Apartment Source of Information:  Patient Patient Interpreter Needed:  None Criminal Activity/Legal Involvement Pertinent to Current Situation/Hospitalization:  No - Comment as needed Significant Relationships:  Significant Other, Siblings, Parents Lives with:  Self Do you feel safe going back to the place where you live?  Yes Need for family participation in patient care:  Yes (Comment)  Care giving concerns:  None identified.    Social Worker assessment / plan:  Patient admits to using cocaine. Patient stated that he is ready for inpatient rehab at this time. LCSW provided inpatient rehab list and contact [facility] (message left), [facility] (no beds available) and [facility]. LCSW assisted patient in completing his initial 10 minute interview with [facility]. Patient has a 9:00 telephone interview tomorrow 6/20, that will take 45 minutes. Patient's sister and significant other are aware and patient will assist in facilitation of today's phone. Patient has the name and number of where he is to call in the morning at 9 a.m. LCSW stressed the importance of calling in on time for appointment.   Employment status:  Disabled (Comment on whether or not currently receiving Disability) Insurance information:  Medicaid In [place] PT Recommendations:  Not assessed at this time Information / Referral to community resources:  Residential Substance Abuse Treatment Options  Patient/Family's Response to care:  Patient is  agreeable to rehab.   Patient/Family's Understanding of and Emotional Response to Diagnosis, Current Treatment, and Prognosis:  Patient understands his diagnosis, treatment and prognosis.   Emotional Assessment Appearance:  Appears stated age Attitude/Demeanor/Rapport:    Affect (typically observed):  Accepting, Calm Orientation:  Oriented to Self, Oriented to Place, Oriented to  Time, Oriented to Situation Alcohol / Substance use:  Not Applicable Psych involvement (Current and /or in the community):  No (Comment)  Discharge Needs  Concerns to be addressed:  Substance Abuse Concerns Readmission within the last 30 days:  Yes Current discharge risk:  Substance Abuse Barriers to Discharge:  No Barriers Identified   Ihor Gully, LCSW 03/15/2017, 1:25 PM

## 2017-03-16 LAB — HIV ANTIBODY (ROUTINE TESTING W REFLEX): HIV Screen 4th Generation wRfx: NONREACTIVE

## 2017-03-16 LAB — GLUCOSE, CAPILLARY
GLUCOSE-CAPILLARY: 136 mg/dL — AB (ref 65–99)
Glucose-Capillary: 182 mg/dL — ABNORMAL HIGH (ref 65–99)

## 2017-03-16 MED ORDER — THIAMINE HCL 100 MG PO TABS: 100.0000 mg | ORAL_TABLET | Freq: Every day | ORAL | 1 refills | Status: DC

## 2017-03-16 MED ORDER — AMLODIPINE BESYLATE 5 MG PO TABS
5.0000 mg | ORAL_TABLET | Freq: Every day | ORAL | Status: DC
  Administered 2017-03-16: 5 mg via ORAL
  Filled 2017-03-16: qty 1

## 2017-03-16 MED ORDER — FOLIC ACID 1 MG PO TABS: 1.0000 mg | ORAL_TABLET | Freq: Every day | ORAL | 1 refills | Status: DC

## 2017-03-16 MED ORDER — ADULT MULTIVITAMIN W/MINERALS CH: 1.0000 | ORAL_TABLET | Freq: Every day | ORAL | 1 refills | Status: DC

## 2017-03-16 MED ORDER — AMLODIPINE BESYLATE 5 MG PO TABS: 5.0000 mg | ORAL_TABLET | Freq: Every day | ORAL | 1 refills | Status: DC

## 2017-03-16 NOTE — Progress Notes (Signed)
Patient alert and oriented but pulled out IV line. Refuses to be stuck again as pt states "im leaving in the morning going to rehab"

## 2017-03-16 NOTE — Clinical Social Work Note (Signed)
LCSW spoke with patient, his mother and sister. Patient has been accepted in to the inpatient treatment program at [facility] in [place] after completing his telephone interview this morning.   Patient will be going [facility] the [facility] at discharge.  Patient is schedule for a TTS consult today due to his admission that his overdose was intentional.   Patient denies current SI.    LCSW signing off.     Yailene Badia, Clydene Pugh, LCSW

## 2017-03-16 NOTE — Discharge Summary (Addendum)
Physician Discharge Summary  Albert Mcdonald DXA:[id number] DOB: 1971/08/06 DOA: 03/14/2017  PCP: Lemmie Evens, MD  Admit date: 03/14/2017 Discharge date: 03/16/2017  Admitted From: Home Disposition:  [facility]  Recommendations for Outpatient Follow-up:  1. Follow up with PCP in 1-2 weeks 2. Counseled on quit using drugs 3. Norvasc 5 mg orally daily started for elevated blood pressure 4. Started on daily supplements of folate, thiamine and multivitamin.  Home Health: None Equipment/Devices: None  Discharge Condition: Stable CODE STATUS: Full Diet recommendation: 2 g salt diet  Brief/Interim Summary: 46 year old male with past medical history of Cathlean Marseilles syndrome, hypertension and substance abuse came to the ER for altered mental status and found to be secondary to drug overdose likely cocaine. He was closely monitored in the ICU and all of his initial workup remained negative. MRI of the brain was done which was negative for any acute pathology but did show 9 mm stable lipoma. On 03/16/2017 his mental status returned to baseline which was also confirmed by his mother who was present at bedside but at that time he also mentioned that his overdose was secondary to suicide attempt therefore psychiatry/TTS was consulted. He was cleared by psychiatry for him to go to [facility] for further rehabilitation. Psychiatry to speak with their inpatient treatment program to coordinate further care. At this time patient does not have any suicidal or homicidal ideation. He understands risk and benefit for illicit drug, alcohol and tobacco use. Mother is present at bedside. For brief period of time during his hospital stay he was noted to have elevated blood pressure which could have been secondary to cocaine use and patient reported he takes lisinopril 10 mg daily at home. EMR states patient is allergic to ace inhibitors therefore I will start him on Norvasc 5 mg daily. At this time  he is deemed medically stable and has reached maximum benefit from in hospital stay and is ready to be discharged with outpatient follow-up with above recommendations.    Discharge Diagnoses:  Principal Problem:   Acute metabolic encephalopathy Active Problems:   OCD (obsessive compulsive disorder)   Kleine-Levin syndrome   Hyperlipidemia   Hypertension   Drug overdose   Polysubstance abuse   Acute encephalopathy  Altered mental status/acute metabolic encephalopathy, resolved -Likely secondary to drug overdose -MRI of the brain was negative for any acute pathology but did find stable 9 mm lipoma which can be followed up outpatient -Needs outpatient rehabilitation  Suicide attempt -No longer an issue at this time -Evaluated by psychiatry/TTS. Cleared for rehabilitation program  Polysubstance abuse -Counseled on quitting any drug use  Hypertension -Norvasc 5 mg daily started   Discharge Instructions   Allergies as of 03/16/2017      Reactions   Ace Inhibitors Rash      Medication List    STOP taking these medications   ibuprofen 200 MG tablet Commonly known as:  ADVIL,MOTRIN     TAKE these medications   amLODipine 5 MG tablet Commonly known as:  NORVASC Take 1 tablet (5 mg total) by mouth daily.   folic acid 1 MG tablet Commonly known as:  FOLVITE Take 1 tablet (1 mg total) by mouth daily. Start taking on:  03/17/2017   multivitamin with minerals Tabs tablet Take 1 tablet by mouth daily. Start taking on:  03/17/2017   thiamine 100 MG tablet Take 1 tablet (100 mg total) by mouth daily. Start taking on:  03/17/2017      Follow-up Information  Lemmie Evens, MD. Call in 2 week(s).   Specialty:  Family Medicine Contact information: [address] [place] [address] [phone]          Allergies  Allergen Reactions  . Ace Inhibitors Rash    Consultations:  Psych   Procedures/Studies: Dg Chest 1 View  Result Date:  03/14/2017 CLINICAL DATA:  Altered mental status EXAM: CHEST 1 VIEW COMPARISON:  November 05, 2013 FINDINGS: There is no edema or consolidation. Heart is borderline enlarged with pulmonary vascularity within normal limits. No adenopathy. There is postoperative change in the lower cervical region. IMPRESSION: Heart borderline enlarged.  No edema or consolidation. Electronically Signed   By: Lowella Grip III M.D.   On: 03/14/2017 20:50   Ct Head Wo Contrast  Result Date: 03/14/2017 CLINICAL DATA:  Altered mental status with questionable drug overdose EXAM: CT HEAD WITHOUT CONTRAST TECHNIQUE: Contiguous axial images were obtained from the base of the skull through the vertex without intravenous contrast. COMPARISON:  Brain MRI May 15, 2014 FINDINGS: Note that there is motion artifact making this study less than optimal. Brain: There is a stable lipoma measuring 8 x 5 mm located immediately posterior to the right quadrigeminal plate cistern. There is no other evident mass. There is no hemorrhage, extra-axial fluid collection, or midline shift. Gray-white compartments appear unremarkable. No acute infarct evident. Vascular: No hyperdense vessel.  No evident vascular calcification. Skull: Bony calvarium appears intact. Sinuses/Orbits: Opacification is noted in multiple ethmoid air cells. Other visualized paranasal sinuses are clear. No intraorbital lesions. Other: Mastoid air cells are clear. IMPRESSION: 8 x 5 mm stable lipoma immediately posterior to the right quadrigeminal plate cistern, stable. No other mass. No hemorrhage, extra-axial fluid collection, or midline shift. Gray-white compartments appear normal. Note that there is a degree of degradation due to motion with this study. Ethmoid air cell disease bilaterally noted. Electronically Signed   By: Lowella Grip III M.D.   On: 03/14/2017 21:01   Mr Brain Wo Contrast  Result Date: 03/15/2017 CLINICAL DATA:  Initial evaluation for acute slurred  speech. EXAM: MRI HEAD WITHOUT CONTRAST TECHNIQUE: Multiplanar, multiecho pulse sequences of the brain and surrounding structures were obtained without intravenous contrast. COMPARISON:  [facility] CT from 03/14/2017. FINDINGS: Brain: Study moderately degraded by motion artifact. Cerebral volume within normal limits for age. No focal parenchymal signal abnormality identified. No significant cerebral white matter disease. No abnormal foci of restricted diffusion to suggest acute or subacute ischemia. Gray-white matter differentiation well maintained. No encephalomalacia to suggest chronic infarction. No evidence for acute or chronic intracranial hemorrhage. No mass lesion, midline shift or mass effect. No hydrocephalus. No extra-axial fluid collection. Small 9 mm lipoma again noted at the right quadrigeminal plate cistern. No extra-axial fluid collection. Major dural sinuses are patent. Pituitary gland suprasellar region within normal limits. Midline structures intact and normal. Vascular: Major intracranial vascular flow voids are maintained. Skull and upper cervical spine: Craniocervical junction within normal limits. Visualized upper cervical spine unremarkable. Bone marrow signal intensity normal. No scalp soft tissue abnormality. Sinuses/Orbits: Globes and orbital soft tissues within normal limits. Scattered mucosal thickening throughout the ethmoidal air cells and maxillary sinuses. No air-fluid level to suggest acute sinusitis. No mastoid effusion. Inner ear structures grossly normal. IMPRESSION: 1. No acute intracranial infarct or other process identified. 2. Small 9 mm lipoma at the right quadrigeminal plate cistern, stable. 3. Otherwise normal brain MRI. Electronically Signed   By: Jeannine Boga M.D.   On: 03/15/2017 21:34  Subjective:   Discharge Exam: Vitals:   03/16/17 0313 03/16/17 1003  BP: (!) 138/96 (!) 165/97  Pulse: 80   Resp: 16 20  Temp: 98.1 F (36.7 C) 98.8 F (37.1 C)    Vitals:   03/15/17 2000 03/15/17 2100 03/16/17 0313 03/16/17 1003  BP: (!) 169/99 (!) 147/86 (!) 138/96 (!) 165/97  Pulse: 83  80   Resp: 20  16 20   Temp: 98.9 F (37.2 C)  98.1 F (36.7 C) 98.8 F (37.1 C)  TempSrc: Oral  Oral Oral  SpO2: 97%  98% 99%  Weight:   94.8 kg (208 lb 15.9 oz)   Height:        General: Pt is alert, awake, not in acute distress Cardiovascular: RRR, S1/S2 +, no rubs, no gallops Respiratory: CTA bilaterally, no wheezing, no rhonchi Abdominal: Soft, NT, ND, bowel sounds + Extremities: no edema, no cyanosis    The results of significant diagnostics from this hospitalization (including imaging, microbiology, ancillary and laboratory) are listed below for reference.     Microbiology: Recent Results (from the past 240 hour(s))  MRSA PCR Screening     Status: None   Collection Time: 03/15/17  3:30 PM  Result Value Ref Range Status   MRSA by PCR NEGATIVE NEGATIVE Final    Comment:        The GeneXpert MRSA Assay (FDA approved for NASAL specimens only), is one component of a comprehensive MRSA colonization surveillance program. It is not intended to diagnose MRSA infection nor to guide or monitor treatment for MRSA infections.      Labs: BNP (last 3 results) No results for input(s): BNP in the last 8760 hours. Basic Metabolic Panel:  Recent Labs Lab 03/14/17 1914 03/15/17 0556  NA 144 142  K 3.2* 3.3*  CL 108 107  CO2 26 27  GLUCOSE 143* 138*  BUN 13 14  CREATININE 0.74 0.79  CALCIUM 9.4 9.1   Liver Function Tests:  Recent Labs Lab 03/14/17 1914  AST 35  ALT 26  ALKPHOS 77  BILITOT 0.7  PROT 7.5  ALBUMIN 4.9   No results for input(s): LIPASE, AMYLASE in the last 168 hours.  Recent Labs Lab 03/15/17 1154  AMMONIA 20   CBC:  Recent Labs Lab 03/14/17 2029 03/15/17 0556  WBC 11.4* 8.4  NEUTROABS 8.5*  --   HGB 15.1 15.3  HCT 42.5 43.6  MCV 85.0 84.7  PLT 177 177   Cardiac Enzymes:  Recent Labs Lab  03/15/17 0035 03/15/17 0556 03/15/17 1154  TROPONINI <0.03 <0.03 <0.03   BNP: Invalid input(s): POCBNP CBG:  Recent Labs Lab 03/15/17 0739 03/15/17 1151 03/15/17 1701 03/16/17 0756 03/16/17 1138  GLUCAP 134* 131* 154* 182* 136*   D-Dimer No results for input(s): DDIMER in the last 72 hours. Hgb A1c No results for input(s): HGBA1C in the last 72 hours. Lipid Profile No results for input(s): CHOL, HDL, LDLCALC, TRIG, CHOLHDL, LDLDIRECT in the last 72 hours. Thyroid function studies No results for input(s): TSH, T4TOTAL, T3FREE, THYROIDAB in the last 72 hours.  Invalid input(s): FREET3 Anemia work up No results for input(s): VITAMINB12, FOLATE, FERRITIN, TIBC, IRON, RETICCTPCT in the last 72 hours. Urinalysis    Component Value Date/Time   COLORURINE YELLOW 03/14/2017 2022   APPEARANCEUR CLEAR 03/14/2017 2022   LABSPEC 1.027 03/14/2017 2022   PHURINE 6.0 03/14/2017 2022   GLUCOSEU 50 (A) 03/14/2017 2022   HGBUR NEGATIVE 03/14/2017 2022   BILIRUBINUR NEGATIVE 03/14/2017 2022  KETONESUR 20 (A) 03/14/2017 2022   PROTEINUR 30 (A) 03/14/2017 2022   UROBILINOGEN 1.0 10/28/2009 0030   NITRITE NEGATIVE 03/14/2017 2022   LEUKOCYTESUR NEGATIVE 03/14/2017 2022   Sepsis Labs Invalid input(s): PROCALCITONIN,  WBC,  LACTICIDVEN Microbiology Recent Results (from the past 240 hour(s))  MRSA PCR Screening     Status: None   Collection Time: 03/15/17  3:30 PM  Result Value Ref Range Status   MRSA by PCR NEGATIVE NEGATIVE Final    Comment:        The GeneXpert MRSA Assay (FDA approved for NASAL specimens only), is one component of a comprehensive MRSA colonization surveillance program. It is not intended to diagnose MRSA infection nor to guide or monitor treatment for MRSA infections.      Time coordinating discharge: Over 30 minutes  SIGNED:   Damita Lack, MD  [facility] 03/16/2017, 12:10 PM Pager   If 7PM-7AM, please contact  night-coverage [email or web address] Password [email or web address]

## 2017-03-16 NOTE — Progress Notes (Signed)
Patient admitted to MD this morning that his overdose was intentional. Mother of patient states that she told staff in the ED when pt was admitted that it was intentional. MD requested for suicide precautions to be initiated, psych consult, and TTS consult. Patient at this moment does not have any suicidal thoughts or intentions to harm self or others.   Margaret Pyle, RN

## 2017-03-16 NOTE — Discharge Instructions (Signed)
Stimulant Use Disorder-Cocaine Cocaine belongs to a group of powerful drugs known as stimulants. Common street names for cocaine include coke, crack, blow, snow, C, powder, and nose candy. Cocaine has some medical uses, but it is often misused because of the effects that it produces. These effects include:  A feeling of extreme pleasure (euphoria).  Alertness.  A high energy level.  Stimulant use disorder is when your stimulant use disrupts your daily life. It may disrupt your relationships and how you do your job. Stimulant use disorder can be dangerous. Cocaine increases your blood pressure and heart rate. Using it can lead to a heart attack or stroke. Cocaine can also make your heart rate irregular and cause seizures. These problems can lead to death. What are the causes? This condition is caused by misusing cocaine. Many people start using cocaine because it makes them feel good. Over time, they get addicted to it. When they try to stop using it, they feel sick. What increases the risk? This condition is more likely to develop in:  People who misuse other drugs.  People with a family history of misusing drugs.  What are the signs or symptoms? Symptoms of this condition include:  Using greater amounts of cocaine than you want to, or using cocaine for longer than you want to.  Trying several times to use less cocaine or to control your cocaine use.  Craving cocaine.  Spending a lot of time getting cocaine, using it, or recovering from its effects.  Having problems at work, at school, at home, or with relationships because of cocaine use.  Giving up or cutting down on important life activities because of cocaine use.  Using cocaine when it is dangerous, such as when driving a car.  Continuing to use cocaine even though it is causing or has led to a physical problem, such as: ? Malnutrition. ? Nosebleeds. ? Chest pain. ? High blood pressure. ? A hole between the part of your  nose that separates your nostrils (perforated nasal septum). ? Lung and kidney damage.  Continuing to use cocaine even though it is causing a mental problem, such as: ? Schizophrenia-like symptoms. ? Depression. ? Bipolar mood swings. ? Anxiety. ? Sleep problems.  Needing more and more cocaine to get the same effect that you want (building up a tolerance).  Having symptoms of withdrawal when you stop using cocaine. Symptoms of withdrawal include: ? Depression. ? Irritability. ? Low energy. ? Restlessness. ? Bad dreams. ? Too little or too much sleep. ? Increased appetite.  How is this diagnosed? This condition is diagnosed with an assessment. During the assessment, your health care provider will ask about your cocaine use and about how it affects your life. Your health care provider may also:  Perform a physical exam or do lab tests to see if you have physical problems resulting from cocaine use.  Screen for drug use.  Refer you to a mental health professional for evaluation.  How is this treated? Treatment for this condition is usually provided by mental health professionals with training in substance use disorders. Treatment may involve:  Counseling. This treatment is also called talk therapy. It is provided by substance use treatment counselors. A counselor can address the reasons you use cocaine and suggest ways to keep you from using it again. The goals of talk therapy are to: ? Find healthy activities to replace using cocaine. ? Identify and avoid what triggers your cocaine use. ? Help you learn how to handle  cravings.  Support groups. Support groups are run by people who have quit using stimulants. They provide emotional support, advice, and guidance.  Medicines.  Follow these instructions at home:  Take over-the-counter and prescription medicines only as told by your health care provider.  Check with your health care provider before starting any new  medicines.  Do not use any products that contain nicotine or tobacco, such as cigarettes and e-cigarettes. If you need help quitting, ask your health care provider.  Keep all follow-up visits as told by your health care provider. This is important. Where to find more information:  [employer] on Drug Abuse: motorcyclefax.com  Substance Abuse and Mental Health Services Administration: ktimeonline.com Contact a health care provider if:  You are not able to take your medicines as told.  You use cocaine again.  Your symptoms get worse. Get help right away if:  You have serious thoughts about hurting yourself or others.  You have a seizure.  You have chest pain.  You have sudden weakness.  You lose some of your vision.  You lose some of your speech. If you ever feel like you may hurt yourself or others, or have thoughts about taking your own life, get help right away. You can go to your nearest emergency department or call:  Your local emergency services (911 in the [place]  A suicide crisis helpline, such as the [place] at 747-760-0648. This is open 24 hours a day.  This information is not intended to replace advice given to you by your health care provider. Make sure you discuss any questions you have with your health care provider. Document Released: 09/10/2000 Document Revised: 06/25/2016 Document Reviewed: 06/25/2016 Elsevier Interactive Patient Education  2018 [employer].   Substance Use Disorder Substance use disorder happens when a person's repeated use of drugs or alcohol interferes with his or her ability to be productive. This disorder can cause problems with your mental and physical health. It can affect your ability to have healthy relationships, and it can keep you from being able to meet your responsibilities at work, home, or school. It can also lead to addiction. The most commonly abused substances  include:  Alcohol.  Tobacco.  Marijuana.  Stimulants, such as cocaine and methamphetamine.  Hallucinogens, such as LSD and PCP.  Opioids, such as some prescription pain medicines and heroin.  What are the causes? This condition may develop due to social, psychological, or physical reasons. Causes include:  Stress.  Abuse.  Peer pressure.  Anxiety.  Depression.  What increases the risk? This condition is more likely to develop in people who:  Are stressed.  Have been abused.  Have a mental health disorder, such as depression.  Are born with certain genes.  What are the signs or symptoms? Symptoms of this condition include:  Using the substance for longer periods of time or at a higher dosage than what is normal or intended.  Having a lasting desire to use the substance.  Being unable to slow down or stop your use of the substance.  Spending an abnormal amount of time seeking the substance, using the substance, or recovering from using the substance.  Craving the substance.  Substance use that: ? Interferes with your work, school, or home life. ? Interferes with your personal and social relationships. ? Makes you give up activities that you once enjoyed or found important.  Using the substance even though: ? You know it is dangerous or bad for your  health. ? You know it is causing problems in your life.  Needing more and more of the substance to get the same effect (developing tolerance).  Experiencing physical symptoms if you do not use the substance (withdrawal).  Using the substance to avoid withdrawal.  How is this diagnosed? This condition may be diagnosed based on:  Your history of substance use.  The way in which substance abuse affects your life.  Having at least two symptoms of substance use disorder within a 22-month period.  Your health care provider may also test your blood and urine for alcohol and drugs. How is this  treated? This condition may be treated by:  Stopping substance use safely. This may require taking medicines and being closely observed for several days.  Taking part in group and individual counseling from mental health providers who help people with substance use disorder.  Staying at a residential treatment center for several days or weeks.  Attending daily counseling sessions at a treatment center.  Taking medicine as told by your health care provider: ? To ease symptoms and prevent complications during withdrawal. ? To treat other mental health issues, such as depression or anxiety. ? To block cravings by causing the same effects as the substance. ? To block the effects of the substance or replace good sensations with unpleasant ones.  Going to a support group to share your experience with others who are going through the same thing. These groups are an important part of long-term recovery for many people. They include 12-step programs like Alcoholics Anonymous and Narcotics Anonymous.  Recovery can be a long process. Many people who undergo treatment start using the substance again after stopping. This is called a relapse. If you have a relapse, that does not mean that treatment will not work. Follow these instructions at home:  Take over-the-counter and prescription medicines only as told by your health care provider.  Do not use any drugs or alcohol.  Keep all follow-up visits as told by your health care provider. This is important. This includes continuing to work with therapists, health care providers, and support groups. Contact a health care provider if:  You cannot take your medicines as told.  Your symptoms get worse.  You have trouble resisting the urge to use drugs or alcohol. Get help right away if:  You have serious thoughts about hurting yourself or someone else.  You have a relapse. This information is not intended to replace advice given to you by your  health care provider. Make sure you discuss any questions you have with your health care provider. Document Released: 05/05/2005 Document Revised: 05/12/2016 Document Reviewed: 09/18/2014 Elsevier Interactive Patient Education  [employer].

## 2017-03-16 NOTE — BH Assessment (Signed)
Tele Assessment Note   Albert Mcdonald is an 46 y.o. male. Pt overdosed on 45 Baclofen due to depression and SA. Pt denies HI and AVH. Pt denies current or past mentalh health treatment. Pt reports crack cocaine use. Pt states he uses $20 crack cocaine daily. Pt denies other drug use or alcohol use. Pt reports depressive symptoms. Pt has been accepted to [facility] for inpatient SA treatment.  Margarita Grizzle, NP recommends inpatient and agrees with the Pt entering into a program at [facility].  Diagnosis:  F33.2 MDD  Past Medical History:  Past Medical History:  Diagnosis Date  . ALS (amyotrophic lateral sclerosis) ([place])    patient denied on 11/01/12 -- has KLS not ALS  . Diabetes mellitus   . Family history of anesthesia complication   . Hyperlipidemia   . Hypertension   . Kleine-Levin syndrome   . OCD (obsessive compulsive disorder)   . Restless leg     Past Surgical History:  Procedure Laterality Date  . ANTERIOR CERVICAL DECOMP/DISCECTOMY FUSION  11/09/2012   Procedure: ANTERIOR CERVICAL DECOMPRESSION/DISCECTOMY FUSION 3 LEVELS C4-C7 ;  Surgeon: Melina Schools, MD;  Location: [place];  Service: Orthopedics;;  ACDF C4-C7     Family History: History reviewed. No pertinent family history.  Social History:  reports that he has never smoked. He has never used smokeless tobacco. He reports that he drinks alcohol. He reports that he does not use drugs.  Additional Social History:  Alcohol / Drug Use Pain Medications: please see mar Prescriptions: please see mar Over the Counter: please see mar History of alcohol / drug use?: Yes Longest period of sobriety (when/how long): unknown Substance #1 Name of Substance 1: crack cocaine 1 - Age of First Use: unknown 1 - Amount (size/oz): $20 1 - Frequency: daily 1 - Duration: ongoing 1 - Last Use / Amount: 03/14/2017  CIWA: CIWA-Ar BP: (!) 165/97 Pulse Rate: 80 COWS:    PATIENT STRENGTHS: (choose at least two) Average or above  average intelligence Communication skills  Allergies:  Allergies  Allergen Reactions  . Ace Inhibitors Rash    Home Medications:  Medications Prior to Admission  Medication Sig Dispense Refill  . ibuprofen (ADVIL,MOTRIN) 200 MG tablet Take 800 mg by mouth every 6 (six) hours as needed for mild pain.      OB/GYN Status:  No LMP for male patient.  General Assessment Data Location of Assessment: [facility] TTS Assessment: In system Is this a Tele or Face-to-Face Assessment?: Tele Assessment Is this an Initial Assessment or a Re-assessment for this encounter?: Initial Assessment Marital status: Single Maiden name: NA Is patient pregnant?: No Pregnancy Status: No Living Arrangements: Alone Can pt return to current living arrangement?: Yes Admission Status: Voluntary Is patient capable of signing voluntary admission?: Yes Referral Source: Self/Family/Friend Insurance type: Medicaid     Crisis Care Plan Living Arrangements: Alone Legal Guardian: Other: (self) Name of Psychiatrist: NA Name of Therapist: NA  Education Status Is patient currently in school?: No Current Grade: NA Highest grade of school patient has completed: high school certificate Name of school: NA Contact person: NA  Risk to self with the past 6 months Suicidal Ideation: No-Not Currently/Within Last 6 Months Has patient been a risk to self within the past 6 months prior to admission? : Yes Suicidal Intent: Yes-Currently Present Has patient had any suicidal intent within the past 6 months prior to admission? : Yes Is patient at risk for suicide?: Yes Suicidal Plan?: No-Not Currently/Within  Last 6 Months Has patient had any suicidal plan within the past 6 months prior to admission? : Yes Access to Means: Yes Specify Access to Suicidal Means: access to pills What has been your use of drugs/alcohol within the last 12 months?: crack cocaine Previous Attempts/Gestures: No How many times?: 0 Other Self Harm  Risks: SA Triggers for Past Attempts: None known Intentional Self Injurious Behavior: None Family Suicide History: No Recent stressful life event(s): Other (Comment) (SA) Persecutory voices/beliefs?: No Depression: Yes Depression Symptoms: Despondent, Insomnia, Tearfulness, Isolating, Fatigue, Guilt, Loss of interest in usual pleasures, Feeling angry/irritable, Feeling worthless/self pity Substance abuse history and/or treatment for substance abuse?: Yes Suicide prevention information given to non-admitted patients: Not applicable  Risk to Others within the past 6 months Homicidal Ideation: No Does patient have any lifetime risk of violence toward others beyond the six months prior to admission? : No Thoughts of Harm to Others: No Current Homicidal Intent: No Current Homicidal Plan: No Access to Homicidal Means: No Identified Victim: NA History of harm to others?: No Assessment of Violence: None Noted Violent Behavior Description: NA Does patient have access to weapons?: No Criminal Charges Pending?: No Does patient have a court date: No Is patient on probation?: No  Psychosis Hallucinations: None noted Delusions: None noted  Mental Status Report Appearance/Hygiene: Unremarkable Eye Contact: Fair Motor Activity: Freedom of movement Speech: Logical/coherent Level of Consciousness: Alert Mood: Depressed Affect: Depressed Anxiety Level: None Thought Processes: Coherent, Relevant Judgement: Unimpaired Orientation: Person, Place, Time, Situation Obsessive Compulsive Thoughts/Behaviors: None  Cognitive Functioning Concentration: Normal Memory: Recent Intact, Remote Intact IQ: Average Insight: Fair Impulse Control: Fair Appetite: Fair Weight Loss: 0 Weight Gain: 0 Sleep: Decreased Total Hours of Sleep: 5 Vegetative Symptoms: None  ADLScreening [facility] Assessment Services) Patient's cognitive ability adequate to safely complete daily activities?: Yes Patient able to  express need for assistance with ADLs?: Yes Independently performs ADLs?: Yes (appropriate for developmental age)  Prior Inpatient Therapy Prior Inpatient Therapy: No Prior Therapy Dates: NA Prior Therapy Facilty/Provider(s): NA Reason for Treatment: NA  Prior Outpatient Therapy Prior Outpatient Therapy: No Prior Therapy Dates: NA Prior Therapy Facilty/Provider(s): NA Reason for Treatment: NA Does patient have an ACCT team?: No Does patient have Intensive In-House Services?  : No Does patient have [facility] services? : No Does patient have P4CC services?: No  ADL Screening (condition at time of admission) Patient's cognitive ability adequate to safely complete daily activities?: Yes Is the patient deaf or have difficulty hearing?: No Does the patient have difficulty seeing, even when wearing glasses/contacts?: No Does the patient have difficulty concentrating, remembering, or making decisions?: No Patient able to express need for assistance with ADLs?: Yes Does the patient have difficulty dressing or bathing?: No Independently performs ADLs?: Yes (appropriate for developmental age) Communication: Independent Dressing (OT): Independent Grooming: Independent Feeding: Independent Bathing: Independent Toileting: Independent In/Out Bed: Independent Walks in Home: Independent Does the patient have difficulty walking or climbing stairs?: No Weakness of Legs: None Weakness of Arms/Hands: None  Home Assistive Devices/Equipment Home Assistive Devices/Equipment: None  Therapy Consults (therapy consults require a physician order) PT Evaluation Needed: No OT Evalulation Needed: No SLP Evaluation Needed: No Abuse/Neglect Assessment (Assessment to be complete while patient is alone) Physical Abuse: Denies Verbal Abuse: Denies Sexual Abuse: Denies Exploitation of patient/patient's resources: Denies Self-Neglect: Denies Possible abuse reported to:: Other (Comment) Values /  Beliefs Cultural Requests During Hospitalization: None Spiritual Requests During Hospitalization: None Consults Spiritual Care Consult Needed: No Social Work Consult Needed:  No Advance Directives (For Healthcare) Does Patient Have a Medical Advance Directive?: No Would patient like information on creating a medical advance directive?: No - Patient declined Nutrition Screen- [facility] Adult/[facility] Patient's home diet: Regular Has the patient recently lost weight without trying?: No Has the patient been eating poorly because of a decreased appetite?: No Malnutrition Screening Tool Score: 0  Additional Information 1:1 In Past 12 Months?: No CIRT Risk: No Elopement Risk: No Does patient have medical clearance?: No     Disposition:  Disposition Initial Assessment Completed for this Encounter: Yes Disposition of Patient: Inpatient treatment program Type of inpatient treatment program: Adult  Tylar Merendino D 03/16/2017 12:10 PM

## 2017-03-16 NOTE — Progress Notes (Signed)
Pt discharged. Escorted out via family. Patient will be transported by family to [facility] in [place]

## 2017-03-16 NOTE — Progress Notes (Signed)
PROGRESS NOTE    Albert Mcdonald  WYO:[id number] DOB: 1970-11-03 DOA: 03/14/2017 PCP: Lemmie Evens, MD   Brief Narrative:  46 year old male with past medical history of Cathlean Marseilles syndrome, hypertension, substance abuse brought to the hospital for altered mental status likely secondary to drug overdose. He was found to be positive for cocaine.   Assessment & Plan:   Principal Problem:   Acute metabolic encephalopathy Active Problems:   OCD (obsessive compulsive disorder)   Kleine-Levin syndrome   Hyperlipidemia   Hypertension   Drug overdose   Polysubstance abuse   Acute encephalopathy   Altered mental status/acute metabolic encephalopathy, resolved -Likely secondary to drug overdose -MRI of the brain was negative for any acute pathology but did find stable 9 mm lipoma which can be followed up outpatient  Suicide attempt -This morning patient and the mother revealed that the drug overdose was an attempt of suicide but no active ideation at this time. Will consult psychiatry to clear him prior to him going to rehabilitation program -Suicide precaution in place  Polysubstance abuse -Counseled on quitting any drug use  Hypertension -States he takes lisinopril 10 mg daily at home but in EMR it says he is allergic to it. Patient is unclear about it. Will start Norvasc 5mg  orally daily instead.   Hypokalemia -No labs from this morning. Repleted yesterday   DVT prophylaxis: Lovenox Code Status: Full Family Communication:  Mother at bedside Disposition Plan: Discharge to rehabilitation program once evaluated by psychiatry  Consultants:   Psychiatry  Procedures:   None  Antimicrobials:   None   Subjective: Patient states he is feeling much better this morning but he also said he did illicit drugs and may have taken "other pills " prior to admission as a suicide attempt as he had been feeling very depressed. Apparently he had not mentioned this before.  Denies any history of recent suicide attempts but mother states he has tried to this many years ago and used to follow outpatient psychiatry. Per mother she thinks he has been involved with that company/friends since he has moved out of the house and has been using illicit substances since then.   Objective: Vitals:   03/15/17 2000 03/15/17 2100 03/16/17 0313 03/16/17 1003  BP: (!) 169/99 (!) 147/86 (!) 138/96 (!) 165/97  Pulse: 83  80   Resp: 20  16 20   Temp: 98.9 F (37.2 C)  98.1 F (36.7 C) 98.8 F (37.1 C)  TempSrc: Oral  Oral Oral  SpO2: 97%  98% 99%  Weight:   94.8 kg (208 lb 15.9 oz)   Height:        Intake/Output Summary (Last 24 hours) at 03/16/17 1145 Last data filed at 03/16/17 0900  Gross per 24 hour  Intake             2010 ml  Output             1550 ml  Net              460 ml   Filed Weights   03/14/17 2343 03/15/17 0500 03/16/17 0313  Weight: 94.7 kg (208 lb 12.4 oz) 94.9 kg (209 lb 3.5 oz) 94.8 kg (208 lb 15.9 oz)    Examination:  General exam: Appears calm and comfortable  Respiratory system: Clear to auscultation. Respiratory effort normal. Cardiovascular system: S1 & S2 heard, RRR. No JVD, murmurs, rubs, gallops or clicks. No pedal edema. Gastrointestinal system: Abdomen is nondistended, soft and nontender. No  organomegaly or masses felt. Normal bowel sounds heard. Central nervous system: Alert and oriented. No focal neurological deficits. Extremities: Symmetric 5 x 5 power. Skin: No rashes, lesions or ulcers Psychiatry: Judgement and insight appear normal. Mood & affect appropriate.     Data Reviewed:   CBC:  Recent Labs Lab 03/14/17 2029 03/15/17 0556  WBC 11.4* 8.4  NEUTROABS 8.5*  --   HGB 15.1 15.3  HCT 42.5 43.6  MCV 85.0 84.7  PLT 177 188   Basic Metabolic Panel:  Recent Labs Lab 03/14/17 1914 03/15/17 0556  NA 144 142  K 3.2* 3.3*  CL 108 107  CO2 26 27  GLUCOSE 143* 138*  BUN 13 14  CREATININE 0.74 0.79  CALCIUM  9.4 9.1   GFR: Estimated Creatinine Clearance: 139.4 mL/min (by C-G formula based on SCr of 0.79 mg/dL). Liver Function Tests:  Recent Labs Lab 03/14/17 1914  AST 35  ALT 26  ALKPHOS 77  BILITOT 0.7  PROT 7.5  ALBUMIN 4.9   No results for input(s): LIPASE, AMYLASE in the last 168 hours.  Recent Labs Lab 03/15/17 1154  AMMONIA 20   Coagulation Profile: No results for input(s): INR, PROTIME in the last 168 hours. Cardiac Enzymes:  Recent Labs Lab 03/15/17 0035 03/15/17 0556 03/15/17 1154  TROPONINI <0.03 <0.03 <0.03   BNP (last 3 results) No results for input(s): PROBNP in the last 8760 hours. HbA1C: No results for input(s): HGBA1C in the last 72 hours. CBG:  Recent Labs Lab 03/15/17 0739 03/15/17 1151 03/15/17 1701 03/16/17 0756 03/16/17 1138  GLUCAP 134* 131* 154* 182* 136*   Lipid Profile: No results for input(s): CHOL, HDL, LDLCALC, TRIG, CHOLHDL, LDLDIRECT in the last 72 hours. Thyroid Function Tests: No results for input(s): TSH, T4TOTAL, FREET4, T3FREE, THYROIDAB in the last 72 hours. Anemia Panel: No results for input(s): VITAMINB12, FOLATE, FERRITIN, TIBC, IRON, RETICCTPCT in the last 72 hours. Sepsis Labs:  Recent Labs Lab 03/14/17 1939 03/15/17 0035  LATICACIDVEN 1.3 1.1    Recent Results (from the past 240 hour(s))  MRSA PCR Screening     Status: None   Collection Time: 03/15/17  3:30 PM  Result Value Ref Range Status   MRSA by PCR NEGATIVE NEGATIVE Final    Comment:        The GeneXpert MRSA Assay (FDA approved for NASAL specimens only), is one component of a comprehensive MRSA colonization surveillance program. It is not intended to diagnose MRSA infection nor to guide or monitor treatment for MRSA infections.          Radiology Studies: Dg Chest 1 View  Result Date: 03/14/2017 CLINICAL DATA:  Altered mental status EXAM: CHEST 1 VIEW COMPARISON:  November 05, 2013 FINDINGS: There is no edema or consolidation. Heart is  borderline enlarged with pulmonary vascularity within normal limits. No adenopathy. There is postoperative change in the lower cervical region. IMPRESSION: Heart borderline enlarged.  No edema or consolidation. Electronically Signed   By: Lowella Grip III M.D.   On: 03/14/2017 20:50   Ct Head Wo Contrast  Result Date: 03/14/2017 CLINICAL DATA:  Altered mental status with questionable drug overdose EXAM: CT HEAD WITHOUT CONTRAST TECHNIQUE: Contiguous axial images were obtained from the base of the skull through the vertex without intravenous contrast. COMPARISON:  Brain MRI May 15, 2014 FINDINGS: Note that there is motion artifact making this study less than optimal. Brain: There is a stable lipoma measuring 8 x 5 mm located immediately posterior to the right  quadrigeminal plate cistern. There is no other evident mass. There is no hemorrhage, extra-axial fluid collection, or midline shift. Gray-white compartments appear unremarkable. No acute infarct evident. Vascular: No hyperdense vessel.  No evident vascular calcification. Skull: Bony calvarium appears intact. Sinuses/Orbits: Opacification is noted in multiple ethmoid air cells. Other visualized paranasal sinuses are clear. No intraorbital lesions. Other: Mastoid air cells are clear. IMPRESSION: 8 x 5 mm stable lipoma immediately posterior to the right quadrigeminal plate cistern, stable. No other mass. No hemorrhage, extra-axial fluid collection, or midline shift. Gray-white compartments appear normal. Note that there is a degree of degradation due to motion with this study. Ethmoid air cell disease bilaterally noted. Electronically Signed   By: Lowella Grip III M.D.   On: 03/14/2017 21:01   Mr Brain Wo Contrast  Result Date: 03/15/2017 CLINICAL DATA:  Initial evaluation for acute slurred speech. EXAM: MRI HEAD WITHOUT CONTRAST TECHNIQUE: Multiplanar, multiecho pulse sequences of the brain and surrounding structures were obtained without  intravenous contrast. COMPARISON:  [facility] CT from 03/14/2017. FINDINGS: Brain: Study moderately degraded by motion artifact. Cerebral volume within normal limits for age. No focal parenchymal signal abnormality identified. No significant cerebral white matter disease. No abnormal foci of restricted diffusion to suggest acute or subacute ischemia. Gray-white matter differentiation well maintained. No encephalomalacia to suggest chronic infarction. No evidence for acute or chronic intracranial hemorrhage. No mass lesion, midline shift or mass effect. No hydrocephalus. No extra-axial fluid collection. Small 9 mm lipoma again noted at the right quadrigeminal plate cistern. No extra-axial fluid collection. Major dural sinuses are patent. Pituitary gland suprasellar region within normal limits. Midline structures intact and normal. Vascular: Major intracranial vascular flow voids are maintained. Skull and upper cervical spine: Craniocervical junction within normal limits. Visualized upper cervical spine unremarkable. Bone marrow signal intensity normal. No scalp soft tissue abnormality. Sinuses/Orbits: Globes and orbital soft tissues within normal limits. Scattered mucosal thickening throughout the ethmoidal air cells and maxillary sinuses. No air-fluid level to suggest acute sinusitis. No mastoid effusion. Inner ear structures grossly normal. IMPRESSION: 1. No acute intracranial infarct or other process identified. 2. Small 9 mm lipoma at the right quadrigeminal plate cistern, stable. 3. Otherwise normal brain MRI. Electronically Signed   By: Jeannine Boga M.D.   On: 03/15/2017 21:34        Scheduled Meds: . enoxaparin (LOVENOX) injection  40 mg Subcutaneous Q24H  . folic acid  1 mg Oral Daily  . insulin aspart  0-9 Units Subcutaneous TID WC  . multivitamin with minerals  1 tablet Oral Daily  . thiamine  100 mg Oral Daily   Continuous Infusions:   LOS: 1 day    Time spent: 35 mins     Dietrick Barris  Arsenio Loader, MD [facility] Pager [phone]   If 7PM-7AM, please contact night-coverage [email or web address] Password [email or web address] 03/16/2017, 11:45 AM

## 2017-05-18 ENCOUNTER — Encounter (HOSPITAL_COMMUNITY): Payer: Self-pay | Admitting: Nurse Practitioner

## 2017-05-18 ENCOUNTER — Ambulatory Visit (HOSPITAL_COMMUNITY)
Admission: EM | Admit: 2017-05-18 | Discharge: 2017-05-18 | Disposition: A | Discharge status: Home / Self Care | Payer: Medicare Other | Attending: Family Medicine | Admitting: Family Medicine

## 2017-05-18 DIAGNOSIS — F429 Obsessive-compulsive disorder, unspecified: Secondary | ICD-10-CM | POA: Diagnosis not present

## 2017-05-18 DIAGNOSIS — Z888 Allergy status to other drugs, medicaments and biological substances status: Secondary | ICD-10-CM | POA: Insufficient documentation

## 2017-05-18 DIAGNOSIS — Z833 Family history of diabetes mellitus: Secondary | ICD-10-CM | POA: Diagnosis not present

## 2017-05-18 DIAGNOSIS — R030 Elevated blood-pressure reading, without diagnosis of hypertension: Secondary | ICD-10-CM

## 2017-05-18 DIAGNOSIS — E119 Type 2 diabetes mellitus without complications: Secondary | ICD-10-CM | POA: Insufficient documentation

## 2017-05-18 DIAGNOSIS — G2581 Restless legs syndrome: Secondary | ICD-10-CM | POA: Insufficient documentation

## 2017-05-18 DIAGNOSIS — Z981 Arthrodesis status: Secondary | ICD-10-CM | POA: Insufficient documentation

## 2017-05-18 DIAGNOSIS — R202 Paresthesia of skin: Secondary | ICD-10-CM

## 2017-05-18 DIAGNOSIS — E785 Hyperlipidemia, unspecified: Secondary | ICD-10-CM | POA: Insufficient documentation

## 2017-05-18 LAB — POCT I-STAT, CHEM 8
BUN: 26 mg/dL — ABNORMAL HIGH (ref 6–20)
CALCIUM ION: 1.11 mmol/L — AB (ref 1.15–1.40)
Chloride: 104 mmol/L (ref 101–111)
Creatinine, Ser: 0.8 mg/dL (ref 0.61–1.24)
Glucose, Bld: 162 mg/dL — ABNORMAL HIGH (ref 65–99)
HEMATOCRIT: 42 % (ref 39.0–52.0)
HEMOGLOBIN: 14.3 g/dL (ref 13.0–17.0)
Potassium: 4.3 mmol/L (ref 3.5–5.1)
SODIUM: 141 mmol/L (ref 135–145)
TCO2: 29 mmol/L (ref 0–100)

## 2017-05-18 LAB — VITAMIN B12: VITAMIN B 12: 86 pg/mL — AB (ref 180–914)

## 2017-05-18 LAB — TSH: TSH: 2.364 u[IU]/mL (ref 0.350–4.500)

## 2017-05-18 LAB — FOLATE: Folate: 26.5 ng/mL

## 2017-05-18 NOTE — ED Provider Notes (Signed)
  [place]   [id number] 05/18/17 Arrival Time: 7654  ASSESSMENT & PLAN:  1. Paresthesia of both lower extremities   2. Elevated blood-pressure reading without diagnosis of hypertension    Will return in 24-48 hours to check a fasting blood sugar as non-fasting is elevated today (and HgA1c if needed). Information on current symptoms given. Discussed possibility of DM diagnosis. Also recheck BP at that time.  Reviewed expectations re: course of current medical issues. Questions answered. Patient verbalized understanding. After Visit Summary given.   SUBJECTIVE:  Albert Mcdonald is a 46 y.o. male who presents with complaint of bilateral feet numbness and tingling for at least one month. No h/o similar symptoms. Occasional tingling in his R arm. Describes feet tingling as a "burning sensation". Interferes with sleep but otherwise doesn't affect ADL. No new medications or recent illnesses. Has been told BP elevated in past as it is today. No visual changes or frequent thirst reported. Weight stable. Occasionally feels he need to urinate frequently.  ROS: As per HPI. All other systems negative.   OBJECTIVE:  Vitals:   05/18/17 1754  BP: (!) 167/103  Pulse: 75  Resp: 16  Temp: 98.5 F (36.9 C)  TempSrc: Oral  SpO2: 99%    General appearance: alert; no distress Eyes: PERRLA; EOMI; conjunctiva normal Neck: supple with FROM Lungs: clear to auscultation bilaterally Heart: regular rate and rhythm Extremities: no cyanosis or edema; symmetrical with no gross deformities Skin: warm and dry Neurologic: normal gait; normal symmetric reflexes; does report decreased sensation of feet bilaterally; normal extremity strength throughout Psychological: alert and cooperative; normal mood and affect  Past Medical History:  Diagnosis Date  . ALS (amyotrophic lateral sclerosis) ([place])    patient denied on 11/01/12 -- has KLS not ALS  . Diabetes mellitus   . Family history of  anesthesia complication   . Hyperlipidemia   . Hypertension   . Kleine-Levin syndrome   . OCD (obsessive compulsive disorder)   . Restless leg    Results for orders placed or performed during the hospital encounter of 05/18/17  I-STAT, chem 8  Result Value Ref Range   Sodium 141 135 - 145 mmol/L   Potassium 4.3 3.5 - 5.1 mmol/L   Chloride 104 101 - 111 mmol/L   BUN 26 (H) 6 - 20 mg/dL   Creatinine, Ser 0.80 0.61 - 1.24 mg/dL   Glucose, Bld 162 (H) 65 - 99 mg/dL   Calcium, Ion 1.11 (L) 1.15 - 1.40 mmol/L   TCO2 29 0 - 100 mmol/L   Hemoglobin 14.3 13.0 - 17.0 g/dL   HCT 42.0 39.0 - 52.0 %    Labs Reviewed  POCT I-STAT, CHEM 8 - Abnormal; Notable for the following:       Result Value   BUN 26 (*)    Glucose, Bld 162 (*)    Calcium, Ion 1.11 (*)    All other components within normal limits  VITAMIN B12  TSH  FOLATE    Allergies  Allergen Reactions  . Ace Inhibitors Rash   FH of DM: Mother.  Past Surgical History:  Procedure Laterality Date  . ANTERIOR CERVICAL DECOMP/DISCECTOMY FUSION  11/09/2012   Procedure: ANTERIOR CERVICAL DECOMPRESSION/DISCECTOMY FUSION 3 LEVELS C4-C7 ;  Surgeon: Melina Schools, MD;  Location: [place];  Service: Orthopedics;;  ACDF C4-C7          Vanessa Kick, MD 05/21/17 0900

## 2017-05-18 NOTE — Discharge Instructions (Signed)
Please return in the next 1-2 days for a fasting blood sugar. If high, we will be able to start a medication to begin lowering it and hopefully improve some of your current symptoms.

## 2017-05-18 NOTE — ED Triage Notes (Signed)
Pt presents with c/o foot and hand pain. The pain began about a month ago. The pain is a tingling numbness that is constant in his feet and intermittent in his right leg, hand and arm. He denies any weakness, fevers, injuries. He has not tried anything for his symptoms at home.

## 2017-05-20 ENCOUNTER — Encounter (HOSPITAL_COMMUNITY): Payer: Self-pay | Admitting: *Deleted

## 2017-05-20 ENCOUNTER — Ambulatory Visit (HOSPITAL_COMMUNITY)
Admission: EM | Admit: 2017-05-20 | Discharge: 2017-05-20 | Disposition: A | Discharge status: Home / Self Care | Payer: Medicare Other | Attending: Family | Admitting: Family

## 2017-05-20 DIAGNOSIS — G629 Polyneuropathy, unspecified: Secondary | ICD-10-CM

## 2017-05-20 DIAGNOSIS — R202 Paresthesia of skin: Secondary | ICD-10-CM

## 2017-05-20 DIAGNOSIS — I10 Essential (primary) hypertension: Secondary | ICD-10-CM | POA: Diagnosis not present

## 2017-05-20 DIAGNOSIS — E1110 Type 2 diabetes mellitus with ketoacidosis without coma: Secondary | ICD-10-CM

## 2017-05-20 DIAGNOSIS — E119 Type 2 diabetes mellitus without complications: Secondary | ICD-10-CM

## 2017-05-20 LAB — GLUCOSE, CAPILLARY: GLUCOSE-CAPILLARY: 131 mg/dL — AB (ref 65–99)

## 2017-05-20 MED ORDER — METFORMIN HCL ER 500 MG PO TB24: ORAL_TABLET | ORAL | 0 refills | Status: DC

## 2017-05-20 MED ORDER — AMLODIPINE BESYLATE 5 MG PO TABS: 10.0000 mg | ORAL_TABLET | Freq: Every day | ORAL | 0 refills | Status: DC

## 2017-05-20 MED ORDER — B-12 1000 MCG PO CAPS: 1.0000 | ORAL_CAPSULE | Freq: Every day | ORAL | 0 refills | Status: AC

## 2017-05-20 NOTE — ED Provider Notes (Signed)
[facility]    CSN: [id number] Arrival date & time: 05/20/17  1005     History   Chief Complaint Chief Complaint  Patient presents with  . Peripheral Neuropathy    HPI Albert Mcdonald is a 46 y.o. male.   Patient is here for evaluation of bilateral feet numbness, tingling for couple of weeks, worsening. He describes is worse at night.  Hasn't tried any medication. Feels better 'when walking.'  History of diabetes type 2 'years ago, told borderline'. Has been on metformin in the past.  History of hypertension. Compliant with amlodipine. Denies exertional chest pain or pressure, numbness or tingling radiating to left arm or jaw, palpitations, dizziness, frequent headaches, changes in vision, or shortness of breath.   IN the [facility] for drug rehab for 3 months for use of crack cocaine, sober 2 months; hasn't seen PCP Dr Charlett Blake in a couple of months and would like PCP closer to home.   H/o kleine- levin syndrome          Past Medical History:  Diagnosis Date  . ALS (amyotrophic lateral sclerosis) ([place])    patient denied on 11/01/12 -- has KLS not ALS  . Diabetes mellitus   . Family history of anesthesia complication   . Hyperlipidemia   . Hypertension   . Kleine-Levin syndrome   . OCD (obsessive compulsive disorder)   . Restless leg     Patient Active Problem List   Diagnosis Date Noted  . Acute encephalopathy 03/15/2017  . Drug overdose 03/14/2017  . Acute metabolic encephalopathy 81/85/6314  . Polysubstance abuse 03/14/2017  . Cocaine abuse   . Type II or unspecified type diabetes mellitus with unspecified complication, not stated as uncontrolled   . Hyperlipidemia   . Hypertension   . Restless leg   . Family history of anesthesia complication   . Cervical spondylosis with myelopathy 09/29/2012  . OCD (obsessive compulsive disorder) 03/16/2011  . 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 HLD (hyperlipidemia) 03/16/2011    Past Surgical  History:  Procedure Laterality Date  . ANTERIOR CERVICAL DECOMP/DISCECTOMY FUSION  11/09/2012   Procedure: ANTERIOR CERVICAL DECOMPRESSION/DISCECTOMY FUSION 3 LEVELS C4-C7 ;  Surgeon: Melina Schools, MD;  Location: [place];  Service: Orthopedics;;  ACDF C4-C7        Home Medications    Prior to Admission medications   Medication Sig Start Date End Date Taking? Authorizing Provider  amLODipine (NORVASC) 5 MG tablet Take 2 tablets (10 mg total) by mouth daily. 05/20/17   Burnard Hawthorne, FNP  Cyanocobalamin (B-12) 1000 MCG CAPS Take 1 capsule by mouth daily. 05/20/17 06/19/17  Burnard Hawthorne, FNP  folic acid (FOLVITE) 1 MG tablet Take 1 tablet (1 mg total) by mouth daily. 03/17/17   Damita Lack, MD  metFORMIN (GLUCOPHAGE XR) 500 MG 24 hr tablet Start 500mg  PO qpm. 05/20/17   Burnard Hawthorne, FNP  Multiple Vitamin (MULTIVITAMIN WITH MINERALS) TABS tablet Take 1 tablet by mouth daily. 03/17/17   Amin, Jeanella Flattery, MD  thiamine 100 MG tablet Take 1 tablet (100 mg total) by mouth daily. 03/17/17   Damita Lack, MD    Family History History reviewed. No pertinent family history.  Social History Social History  Substance Use Topics  . Smoking status: Never Smoker  . Smokeless tobacco: Never Used  . Alcohol use Yes     Comment: once every couple months     Allergies   Ace inhibitors   Review of  Systems Review of Systems  Constitutional: Negative for chills and fever.  Eyes: Negative for visual disturbance.  Respiratory: Negative for cough.   Cardiovascular: Negative for chest pain and palpitations.  Gastrointestinal: Negative for nausea and vomiting.  Endocrine: Negative for polydipsia, polyphagia and polyuria.  Neurological: Positive for numbness. Negative for dizziness, weakness and headaches.     Physical Exam Triage Vital Signs ED Triage Vitals [05/20/17 1038]  Enc Vitals Group     BP (!) 146/99     Pulse Rate 84     Resp 17     Temp 98.4 F (36.9 C)       Temp Source Oral     SpO2 99 %     Weight      Height      Head Circumference      Peak Flow      Pain Score      Pain Loc      Pain Edu?      Excl. in [place]?    No data found.   Updated Vital Signs BP (!) 146/99   Pulse 84   Temp 98.4 F (36.9 C) (Oral)   Resp 17   SpO2 99%   Visual Acuity Right Eye Distance:   Left Eye Distance:   Bilateral Distance:    Right Eye Near:   Left Eye Near:    Bilateral Near:     Physical Exam  Constitutional: He appears well-developed and well-nourished.  Cardiovascular: Regular rhythm and normal heart sounds.   Pulmonary/Chest: Effort normal and breath sounds normal. No respiratory distress. He has no wheezes. He has no rhonchi. He has no rales.  Musculoskeletal:       Feet:  Area of numbness as described by patient marked on diagram. Skin intact. No lesions, erythema, increased warmth, swelling.   Lymphadenopathy:       Head (left side): No submandibular and no preauricular adenopathy present.  Neurological: He is alert.  Sensation intact BLE.  Skin: Skin is warm and dry.  Psychiatric: He has a normal mood and affect. His speech is normal and behavior is normal.  Vitals reviewed.    UC Treatments / Results  Labs (all labs ordered are listed, but only abnormal results are displayed) Labs Reviewed  GLUCOSE, CAPILLARY - Abnormal; Notable for the following:       Result Value   Glucose-Capillary 131 (*)    All other components within normal limits  CBG MONITORING, ED    EKG  EKG Interpretation None       Radiology No results found.  Procedures Procedures (including critical care time)  Medications Ordered in UC Medications - No data to display   Initial Impression / Assessment and Plan / UC Course  I have reviewed the triage vital signs and the nursing notes.  Pertinent labs & imaging results that were available during my care of the patient were reviewed by me and considered in my medical decision making  (see chart for details).       Final Clinical Impressions(s) / UC Diagnoses   Final diagnoses:  Essential hypertension  Uncontrolled type 2 diabetes mellitus with ketoacidosis without coma, unspecified whether long term insulin use (HCC)  Neuropathy   Fasting >126, Diagnostic of diabetes. We'll start patient on metformin today as he's been on the past. Blood pressure which I took was 142/99. Decided since this patient does not have formal PCP at this time, I would be more comfortable starting an agent which  would not affect his electrolytes;  I have increased amlodipine that he has to aid with compliance. Advised him to look out for side effect of lower extremity swelling. Place referral to [facility] for PCP closer to home. Also advised him to start B12 supplement since this was low and this may be contributing to his neuropathy. 3 months supplies of all medications so patient has time to establish with PCP.   Return precautions given.   New Prescriptions New Prescriptions   CYANOCOBALAMIN (B-12) 1000 MCG CAPS    Take 1 capsule by mouth daily.   METFORMIN (GLUCOPHAGE XR) 500 MG 24 HR TABLET    Start 500mg  PO qpm.     Controlled Substance Prescriptions [place] Controlled Substance Registry consulted? Not Applicable   Burnard Hawthorne, FNP 05/20/17 1119    Burnard Hawthorne, FNP 05/20/17 [phone]

## 2017-05-20 NOTE — ED Triage Notes (Addendum)
Patient in for follow up for fasting CBG. Patient with numbness tingling sensation to feet x 1 month. Patient states it is worse at night. Patient reports he has fasted.

## 2017-05-20 NOTE — Discharge Instructions (Addendum)
As discussed today, fasting glucose is diagnostic for diabetes. We will start metformin today. I'm giving you 3 month supply of this medication. Also discussed your vitamin B-12 is low-likely also a reason for your neuropathy I sent a vitamin in for this.  Your blood pressure is mildly elevated, I went ahead and increased amlodipine to 10 mg once per day and given a 3 month prescription of this. A common side effect is lower extremity swelling, please me mindful of this and if it occurs, please follow up with primary care provider for ongoing blood pressure management.  If there is no improvement in your symptoms, or if there is any worsening of symptoms, or if you have any additional concerns, please return for re-evaluation; or, if we are closed, consider going to the Emergency Room for evaluation if symptoms urgent.

## 2017-06-01 ENCOUNTER — Ambulatory Visit: Payer: Medicare Other | Attending: Internal Medicine | Admitting: Physician Assistant

## 2017-06-01 VITALS — BP 152/100 | HR 69 | Temp 97.8°F | Wt 235.0 lb

## 2017-06-01 DIAGNOSIS — Z7984 Long term (current) use of oral hypoglycemic drugs: Secondary | ICD-10-CM | POA: Diagnosis not present

## 2017-06-01 DIAGNOSIS — Z888 Allergy status to other drugs, medicaments and biological substances status: Secondary | ICD-10-CM | POA: Insufficient documentation

## 2017-06-01 DIAGNOSIS — G2581 Restless legs syndrome: Secondary | ICD-10-CM | POA: Insufficient documentation

## 2017-06-01 DIAGNOSIS — E785 Hyperlipidemia, unspecified: Secondary | ICD-10-CM | POA: Diagnosis not present

## 2017-06-01 DIAGNOSIS — I10 Essential (primary) hypertension: Secondary | ICD-10-CM | POA: Insufficient documentation

## 2017-06-01 DIAGNOSIS — F429 Obsessive-compulsive disorder, unspecified: Secondary | ICD-10-CM | POA: Insufficient documentation

## 2017-06-01 DIAGNOSIS — E119 Type 2 diabetes mellitus without complications: Secondary | ICD-10-CM | POA: Diagnosis present

## 2017-06-01 DIAGNOSIS — E1140 Type 2 diabetes mellitus with diabetic neuropathy, unspecified: Secondary | ICD-10-CM | POA: Diagnosis not present

## 2017-06-01 DIAGNOSIS — E1142 Type 2 diabetes mellitus with diabetic polyneuropathy: Secondary | ICD-10-CM | POA: Diagnosis not present

## 2017-06-01 DIAGNOSIS — F1410 Cocaine abuse, uncomplicated: Secondary | ICD-10-CM | POA: Diagnosis not present

## 2017-06-01 DIAGNOSIS — E118 Type 2 diabetes mellitus with unspecified complications: Secondary | ICD-10-CM | POA: Diagnosis not present

## 2017-06-01 DIAGNOSIS — Z9889 Other specified postprocedural states: Secondary | ICD-10-CM | POA: Insufficient documentation

## 2017-06-01 DIAGNOSIS — F840 Autistic disorder: Secondary | ICD-10-CM | POA: Diagnosis not present

## 2017-06-01 DIAGNOSIS — Z79899 Other long term (current) drug therapy: Secondary | ICD-10-CM | POA: Insufficient documentation

## 2017-06-01 MED ORDER — AMLODIPINE BESYLATE 10 MG PO TABS: 10.0000 mg | ORAL_TABLET | Freq: Every day | ORAL | 1 refills | Status: DC

## 2017-06-01 MED ORDER — GABAPENTIN 300 MG PO CAPS: 300.0000 mg | ORAL_CAPSULE | Freq: Two times a day (BID) | ORAL | 1 refills | Status: DC

## 2017-06-01 MED ORDER — METFORMIN HCL 500 MG PO TABS
500.0000 mg | ORAL_TABLET | Freq: Two times a day (BID) | ORAL | 1 refills | Status: DC
Start: 2017-06-01 — End: 2021-06-12

## 2017-06-01 NOTE — Progress Notes (Signed)
Albert Mcdonald  XHB:[id number]  YBO:[id number]  DOB - December 19, 1970  Chief Complaint  Patient presents with  . Hospitalization Follow-up    OD       Subjective:   Albert Mcdonald is a 46 y.o. male here today for Establishment of care. He has a history of Cathlean Marseilles syndrome, autism, prediabetes mellitus type II, hyperlipidemia, OCD, hypertension and polysubstance abuse. He went to the emergency department June 18th  with confusion. His mom talks to him twice daily and she hadn't heard from him in 24 hours. She became concerned and called 911. He was found to be confused and taken to the emergency department. His systolic blood pressure was 149. He was positive for cocaine. She believed that he possibly had a suicide attempt and he was admitted by the internal medicine team with consultation from psychiatry. Ultimately he was discharged with Norvasc for his blood pressure and to the [facility] in regards to the suicide attempt/cocaine abuse.  He has been cocaine free for 2 months. More recently on August 22 he went to an urgent care center with bilateral feet numbness and tingling. He was having difficulty sleeping because of this. Actually feels better when he walks. His blood pressure again was high at 167/103. Her glucose is 162. He was told to return in 2 days for repeat glucose. On 05/20/2017 his glucose is 131. He was started on metformin 500 mg daily. He has been compliant with his regimen. He doesn't really have much insight in regards to the disease process however. Still complains of bilateral feet numbness and tingling. No new complaints.   ROS: GEN: denies fever or chills, denies change in weight Skin: denies lesions or rashes HEENT: denies headache, earache, epistaxis, sore throat, or neck pain LUNGS: denies SHOB, dyspnea, PND, orthopnea CV: denies CP or palpitations ABD: denies abd pain, N or V EXT: denies muscle spasms or swelling; no pain in lower ext, no  weakness NEURO: + numbness or tingling, denies sz, stroke or TIA  ALLERGIES: Allergies  Allergen Reactions  . Ace Inhibitors Rash    PAST MEDICAL HISTORY: Past Medical History:  Diagnosis Date  . ALS (amyotrophic lateral sclerosis) ([place])    patient denied on 11/01/12 -- has KLS not ALS  . Diabetes mellitus   . Family history of anesthesia complication   . Hyperlipidemia   . Hypertension   . Kleine-Levin syndrome   . OCD (obsessive compulsive disorder)   . Restless leg     PAST SURGICAL HISTORY: Past Surgical History:  Procedure Laterality Date  . ANTERIOR CERVICAL DECOMP/DISCECTOMY FUSION  11/09/2012   Procedure: ANTERIOR CERVICAL DECOMPRESSION/DISCECTOMY FUSION 3 LEVELS C4-C7 ;  Surgeon: Melina Schools, MD;  Location: [facility];  Service: Orthopedics;;  ACDF C4-C7     MEDICATIONS AT HOME: Prior to Admission medications   Medication Sig Start Date End Date Taking? Authorizing Provider  Cyanocobalamin (B-12) 1000 MCG CAPS Take 1 capsule by mouth daily. 05/20/17 06/19/17 Yes Arnett, Yvetta Coder, FNP  amLODipine (NORVASC) 10 MG tablet Take 1 tablet (10 mg total) by mouth daily. 06/01/17   Brayton Caves, PA-C  folic acid (FOLVITE) 1 MG tablet Take 1 tablet (1 mg total) by mouth daily. Patient not taking: Reported on 06/01/2017 03/17/17   Damita Lack, MD  gabapentin (NEURONTIN) 300 MG capsule Take 1 capsule (300 mg total) by mouth 2 (two) times daily. 06/01/17   Brayton Caves, PA-C  metFORMIN (GLUCOPHAGE) 500 MG tablet Take 1 tablet (500 mg  total) by mouth 2 (two) times daily with a meal. 06/01/17   Brayton Caves, PA-C  Multiple Vitamin (MULTIVITAMIN WITH MINERALS) TABS tablet Take 1 tablet by mouth daily. Patient not taking: Reported on 06/01/2017 03/17/17   Damita Lack, MD  thiamine 100 MG tablet Take 1 tablet (100 mg total) by mouth daily. Patient not taking: Reported on 06/01/2017 03/17/17   Damita Lack, MD   Family-unsure due to cognitive issues  Social-unmarried, no  children, unemployed  Objective:   Vitals:   06/01/17 0845  BP: (!) 152/100  Pulse: 69  Temp: 97.8 F (36.6 C)  Weight: 235 lb (106.6 kg)    Exam-benign General appearance : Awake, alert, not in any distress. Speech Clear. Not toxic looking HEENT: Atraumatic and Normocephalic, pupils equally reactive to light and accomodation Neck: supple, no JVD. No cervical lymphadenopathy.  Chest:Good air entry bilaterally, no added sounds  CVS: S1 S2 regular, no murmurs.  Abdomen: Bowel sounds present, Non tender and not distended with no guarding, rigidity or rebound. Extremities: B/L Lower Ext shows no edema, both legs are warm to touch Neurology: Awake alert, and oriented X 3, CN II-XII intact, Non focal   Data Review Lab Results  Component Value Date   HGBA1C 6.7 04/09/2014   HGBA1C 6.4 % 06/26/2013     Assessment & Plan  1. DM 2  -Inc Metformin 500 mg BID  -diabetes education referral  -not sure that he will be able to check BS and keep a log due to  some cognitive impariment 2. HTN  -Inc Norvasc 10 mg daily   -low salt diet 3. Diabetic neuropathy  -add Neurontin  -working on better BS control   Return in about 4 weeks (around 06/29/2017).  The patient was given clear instructions to go to ER or return to medical center if symptoms don't improve, worsen or new problems develop. The patient verbalized understanding. The patient was told to call to get lab results if they haven't heard anything in the next week.   Total time spent with patient was 28 min. Greater than 50 % of this visit was spent face to face counseling and coordinating care regarding risk factor modification, compliance importance and encouragement, education related to diabetes, compliance and blood pressure.  This note has been created with Surveyor, quantity. Any transcriptional errors are unintentional.    Zettie Pho, PA-C [facility] and  [facility] [place], [place]   06/01/2017, 9:30 AM

## 2017-07-08 ENCOUNTER — Encounter: Payer: Self-pay | Admitting: Family Medicine

## 2017-07-08 ENCOUNTER — Ambulatory Visit: Payer: Medicare Other | Attending: Family Medicine | Admitting: Family Medicine

## 2017-07-08 VITALS — BP 151/91 | HR 71 | Temp 97.8°F | Ht 73.0 in | Wt 237.0 lb

## 2017-07-08 DIAGNOSIS — Z7984 Long term (current) use of oral hypoglycemic drugs: Secondary | ICD-10-CM | POA: Insufficient documentation

## 2017-07-08 DIAGNOSIS — M545 Low back pain, unspecified: Secondary | ICD-10-CM

## 2017-07-08 DIAGNOSIS — E1169 Type 2 diabetes mellitus with other specified complication: Secondary | ICD-10-CM | POA: Diagnosis not present

## 2017-07-08 DIAGNOSIS — Z79899 Other long term (current) drug therapy: Secondary | ICD-10-CM | POA: Diagnosis not present

## 2017-07-08 DIAGNOSIS — Z23 Encounter for immunization: Secondary | ICD-10-CM | POA: Insufficient documentation

## 2017-07-08 DIAGNOSIS — E1149 Type 2 diabetes mellitus with other diabetic neurological complication: Secondary | ICD-10-CM

## 2017-07-08 DIAGNOSIS — I10 Essential (primary) hypertension: Secondary | ICD-10-CM | POA: Insufficient documentation

## 2017-07-08 DIAGNOSIS — G8929 Other chronic pain: Secondary | ICD-10-CM | POA: Insufficient documentation

## 2017-07-08 DIAGNOSIS — F429 Obsessive-compulsive disorder, unspecified: Secondary | ICD-10-CM | POA: Insufficient documentation

## 2017-07-08 DIAGNOSIS — E1140 Type 2 diabetes mellitus with diabetic neuropathy, unspecified: Secondary | ICD-10-CM | POA: Insufficient documentation

## 2017-07-08 LAB — POCT GLYCOSYLATED HEMOGLOBIN (HGB A1C): HEMOGLOBIN A1C: 6.9

## 2017-07-08 LAB — GLUCOSE, POCT (MANUAL RESULT ENTRY): POC Glucose: 147 mg/dl — AB (ref 70–99)

## 2017-07-08 MED ORDER — ACCU-CHEK SOFTCLIX LANCET DEV MISC: 5 refills | Status: DC

## 2017-07-08 MED ORDER — GLUCOSE BLOOD VI STRP: ORAL_STRIP | 12 refills | Status: DC

## 2017-07-08 MED ORDER — ACCU-CHEK AVIVA DEVI: 0 refills | Status: DC

## 2017-07-08 MED ORDER — MELOXICAM 7.5 MG PO TABS: 7.5000 mg | ORAL_TABLET | Freq: Every day | ORAL | 2 refills | Status: DC

## 2017-07-08 NOTE — Progress Notes (Signed)
Subjective:  Patient ID: Albert Mcdonald, male    DOB: 03/21/1971  Age: 46 y.o. MRN: [id number]  CC: Diabetes and Hypertension   HPI [name] is a 46 year old male with a history of type 2 diabetes mellitus (A1c 6.9), Cathlean Marseilles syndrome, autism, hypertension who presents today to get established with me.  He was seen by the PA at his last office visit and placed on metformin for his diabetes; Neurontin was started due to complains in keeping with neuropathy. Amlodipine dose was also increased. He endorses compliance with all his medications however complains he does have persisting numbness in his legs and in his left upper extremity. The left upper extremity numbness is chronic and he endorses a history of neck surgery a few years back. Numbness starts from his feet and radiates up his legs to his back.  He complains of low back pain which he states is chronic and feels like "his back locks up". Back pain does not radiate down his lower extremities. He currently works 7 days a week with a 1 hour break from 5 AM to 5 PM and is wondering if I could write him a letter so he could get a day off as he thinks his symptoms are from his work.  At the moment he is enrolled at the [facility] where he receives substance abuse counseling; states an 'advocate' will be visiting him to assess his overall care. He does have a previous history of suicidal attempt but denies any at this time.  Past Medical History:  Diagnosis Date  . ALS (amyotrophic lateral sclerosis) ([place])    patient denied on 11/01/12 -- has KLS not ALS  . Diabetes mellitus   . Family history of anesthesia complication   . Hyperlipidemia   . Hypertension   . Kleine-Levin syndrome   . OCD (obsessive compulsive disorder)   . Restless leg     Past Surgical History:  Procedure Laterality Date  . ANTERIOR CERVICAL DECOMP/DISCECTOMY FUSION  11/09/2012   Procedure: ANTERIOR CERVICAL DECOMPRESSION/DISCECTOMY FUSION 3  LEVELS C4-C7 ;  Surgeon: Melina Schools, MD;  Location: [facility];  Service: Orthopedics;;  ACDF C4-C7     Allergies  Allergen Reactions  . Ace Inhibitors Rash     Outpatient Medications Prior to Visit  Medication Sig Dispense Refill  . amLODipine (NORVASC) 10 MG tablet Take 1 tablet (10 mg total) by mouth daily. 90 tablet 1  . gabapentin (NEURONTIN) 300 MG capsule Take 1 capsule (300 mg total) by mouth 2 (two) times daily. 180 capsule 1  . metFORMIN (GLUCOPHAGE) 500 MG tablet Take 1 tablet (500 mg total) by mouth 2 (two) times daily with a meal. 938 tablet 1  . folic acid (FOLVITE) 1 MG tablet Take 1 tablet (1 mg total) by mouth daily. (Patient not taking: Reported on 06/01/2017) 30 tablet 1  . Multiple Vitamin (MULTIVITAMIN WITH MINERALS) TABS tablet Take 1 tablet by mouth daily. (Patient not taking: Reported on 06/01/2017) 30 tablet 1  . thiamine 100 MG tablet Take 1 tablet (100 mg total) by mouth daily. (Patient not taking: Reported on 06/01/2017) 30 tablet 1   No facility-administered medications prior to visit.     ROS Review of Systems  Constitutional: Negative for activity change and appetite change.  HENT: Negative for sinus pressure and sore throat.   Eyes: Negative for visual disturbance.  Respiratory: Negative for cough, chest tightness and shortness of breath.   Cardiovascular: Negative for chest pain and leg  swelling.  Gastrointestinal: Negative for abdominal distention, abdominal pain, constipation and diarrhea.  Endocrine: Negative.   Genitourinary: Negative for dysuria.  Musculoskeletal: Positive for back pain. Negative for joint swelling and myalgias.  Skin: Negative for rash.  Allergic/Immunologic: Negative.   Neurological: Positive for numbness. Negative for weakness and light-headedness.  Psychiatric/Behavioral: Negative for dysphoric mood and suicidal ideas.    Objective:  BP (!) 151/91   Pulse 71   Temp 97.8 F (36.6 C) (Oral)   Ht 6\' 1"  (1.854 m)   Wt 237 lb  (107.5 kg)   SpO2 97%   BMI 31.27 kg/m   BP/Weight 07/08/2017 06/01/2017 01/03/8118  Systolic BP 147 829 562  Diastolic BP 91 130 99  Wt. (Lbs) 237 235 -  BMI 31.27 31.87 -      Physical Exam  Constitutional: He is oriented to person, place, and time. He appears well-developed and well-nourished.  Cardiovascular: Normal rate, normal heart sounds and intact distal pulses.   No murmur heard. Pulmonary/Chest: Effort normal and breath sounds normal. He has no wheezes. He has no rales. He exhibits no tenderness.  Abdominal: Soft. Bowel sounds are normal. He exhibits no distension and no mass. There is no tenderness.  Musculoskeletal: Normal range of motion. He exhibits no edema or tenderness.  Negative straight leg raise bilaterally  Neurological: He is alert and oriented to person, place, and time.  Psychiatric: He has a normal mood and affect.     CMP Latest Ref Rng & Units 05/18/2017 03/15/2017 03/14/2017  Glucose 65 - 99 mg/dL 162(H) 138(H) 143(H)  BUN 6 - 20 mg/dL 26(H) 14 13  Creatinine 0.61 - 1.24 mg/dL 0.80 0.79 0.74  Sodium 135 - 145 mmol/L 141 142 144  Potassium 3.5 - 5.1 mmol/L 4.3 3.3(L) 3.2(L)  Chloride 101 - 111 mmol/L 104 107 108  CO2 22 - 32 mmol/L - 27 26  Calcium 8.9 - 10.3 mg/dL - 9.1 9.4  Total Protein 6.5 - 8.1 g/dL - - 7.5  Total Bilirubin 0.3 - 1.2 mg/dL - - 0.7  Alkaline Phos 38 - 126 U/L - - 77  AST 15 - 41 U/L - - 35  ALT 17 - 63 U/L - - 26    Lipid Panel     Component Value Date/Time   CHOL 196 04/09/2014 1554   TRIG 312 (H) 04/09/2014 1554   TRIG 207 (H) 06/26/2013 1421   HDL 35 (L) 04/09/2014 1554   HDL 45 06/26/2013 1421   CHOLHDL 5.6 (H) 04/09/2014 1554   LDLCALC 99 04/09/2014 1554   LDLCALC 124 (H) 06/26/2013 1421    Lab Results  Component Value Date   HGBA1C 6.9 07/08/2017    Assessment & Plan:   1. Type 2 diabetes mellitus with other specified complication, without long-term current use of insulin (HCC) Controlled with A1c of  6.9 Continue metformin Emphasized compliance with diabetic diet, lifestyle modifications - POCT glucose (manual entry) - POCT glycosylated hemoglobin (Hb A1C) - Ambulatory referral to Ophthalmology  2. Other diabetic neurological complication associated with type 2 diabetes mellitus ([place]) Uncontrolled Currently on gabapentin Advised to allow some time to assess for improvement after which we will increase his dose if symptoms persist  3. Need for influenza vaccination - Flu Vaccine QUAD 36+ mos IM  4. Chronic midline low back pain without sciatica Muscle spasm  Vs degenerative changes Placed on meloxicam Patient and mother decline use of muscle relaxant due to previous history of suicidal attempt with a muscle  relaxant Advised he would need to request time off from his employer, a note from me would only indicate his medical conditions.   5. Hypertension Elevated above goal of less than 130/80 Continue Norvasc Low-sodium, DASH diet Will add second antihypertensive-probably ACE inhibitor if still elevated.   Meds ordered this encounter  Medications  . meloxicam (MOBIC) 7.5 MG tablet    Sig: Take 1 tablet (7.5 mg total) by mouth daily.    Dispense:  30 tablet    Refill:  2  . Blood Glucose Monitoring Suppl (ACCU-CHEK AVIVA) device    Sig: Use as instructed daily.    Dispense:  1 each    Refill:  0  . glucose blood (ACCU-CHEK AVIVA) test strip    Sig: Use as instructed daily    Dispense:  100 each    Refill:  12  . Lancet Devices (ACCU-CHEK SOFTCLIX) lancets    Sig: Use as instructed daily.    Dispense:  1 each    Refill:  5    Follow-up: Return in about 3 months (around 10/08/2017) for Follow up on diabetes mellitus.   Arnoldo Morale MD

## 2017-07-08 NOTE — Patient Instructions (Signed)
Diabetic Neuropathy Diabetic neuropathy is a nerve disease or nerve damage that is caused by diabetes mellitus. About half of all people with diabetes mellitus have some form of nerve damage. Nerve damage is more common in those who have had diabetes mellitus for many years and who generally have not had good control of their blood sugar (glucose) level. Diabetic neuropathy is a common complication of diabetes mellitus. There are three common types of diabetic neuropathy and a fourth type that is less common and less understood:  Peripheral neuropathy-This is the most common type of diabetic neuropathy. It causes damage to the nerves of the feet and legs first and then eventually the hands and arms. The damage affects the ability to sense touch.  Autonomic neuropathy-This type causes damage to the autonomic nervous system, which controls the following functions: ? Heartbeat. ? Body temperature. ? Blood pressure. ? Urination. ? Digestion. ? Sweating. ? Sexual function.  Focal neuropathy-Focal neuropathy can be painful and unpredictable and occurs most often in older adults with diabetes mellitus. It involves a specific nerve or one area and often comes on suddenly. It usually does not cause long-term problems.  Radiculoplexus neuropathy- Sometimes called lumbosacral radiculoplexus neuropathy, radiculoplexus neuropathy affects the nerves of the thighs, hips, buttocks, or legs. It is more common in people with type 2 diabetes mellitus and in older men. It is characterized by debilitating pain, weakness, and atrophy, usually in the thigh muscles.  What are the causes? The cause of peripheral, autonomic, and focal neuropathies is diabetes mellitus that is uncontrolled and high glucose levels. The cause of radiculoplexus neuropathy is unknown. However, it is thought to be caused by inflammation related to uncontrolled glucose levels. What are the signs or symptoms? Peripheral Neuropathy Peripheral  neuropathy develops slowly over time. When the nerves of the feet and legs no longer work there may be:  Burning, stabbing, or aching pain in the legs or feet.  Inability to feel pressure or pain in your feet. This can lead to: ? Thick calluses over pressure areas. ? Pressure sores. ? Ulcers.  Foot deformities.  Reduced ability to feel temperature changes.  Muscle weakness.  Autonomic Neuropathy The symptoms of autonomic neuropathy vary depending on which nerves are affected. Symptoms may include:  Problems with digestion, such as: ? Feeling sick to your stomach (nausea). ? Vomiting. ? Bloating. ? Constipation. ? Diarrhea. ? Abdominal pain.  Difficulty with urination. This occurs if you lose your ability to sense when your bladder is full. Problems include: ? Urine leakage (incontinence). ? Inability to empty your bladder completely (retention).  Rapid or irregular heartbeat (palpitations).  Blood pressure drops when you stand up (orthostatic hypotension). When you stand up you may feel: ? Dizzy. ? Weak. ? Faint.  In men, inability to attain and maintain an erection.  In women, vaginal dryness and problems with decreased sexual desire and arousal.  Problems with body temperature regulation.  Increased or decreased sweating.  Focal Neuropathy  Abnormal eye movements or abnormal alignment of both eyes.  Weakness in the wrist.  Foot drop. This results in an inability to lift the foot properly and abnormal walking or foot movement.  Paralysis on one side of your face (Bell palsy).  Chest or abdominal pain. Radiculoplexus Neuropathy  Sudden, severe pain in your hip, thigh, or buttocks.  Weakness and wasting of thigh muscles.  Difficulty rising from a seated position.  Abdominal swelling.  Unexplained weight loss (usually more than 10 lb [4.5 kg]). How is   this diagnosed? Peripheral Neuropathy Your senses may be tested. Sensory function testing can be  done with:  A light touch using a monofilament.  A vibration with tuning fork.  A sharp sensation with a pin prick.  Other tests that can help diagnose neuropathy are:  Nerve conduction velocity. This test checks the transmission of an electrical current through a nerve.  Electromyography. This shows how muscles respond to electrical signals transmitted by nearby nerves.  Quantitative sensory testing. This is used to assess how your nerves respond to vibrations and changes in temperature.  Autonomic Neuropathy Diagnosis is often based on reported symptoms. Tell your health care provider if you experience:  Dizziness.  Constipation.  Diarrhea.  Inappropriate urination or inability to urinate.  Inability to get or maintain an erection.  Tests that may be done include:  Electrocardiography or Holter monitor. These are tests that can help show problems with the heart rate or heart rhythm.  An X-ray exam may be done.  Focal Neuropathy Diagnosis is made based on your symptoms and what your health care provider finds during your exam. Other tests may be done. They may include:  Nerve conduction velocities. This checks the transmission of electrical current through a nerve.  Electromyography. This shows how muscles respond to electrical signals transmitted by nearby nerves.  Quantitative sensory testing. This test is used to assess how your nerves respond to vibration and changes in temperature.  Radiculoplexus Neuropathy  Often the first thing is to eliminate any other issue or problems that might be the cause, as there is no standard test for diagnosis.  X-ray exam of your spine and lumbar region.  Spinal tap to rule out cancer.  MRI to rule out other lesions. How is this treated? Once nerve damage occurs, it cannot be reversed. The goal of treatment is to keep the disease or nerve damage from getting worse and affecting more nerve fibers. Controlling your blood  glucose level is the key. Most people with radiculoplexus neuropathy see at least a partial improvement over time. You will need to keep your blood glucose and HbA1c levels in the target range determined by your health care provider. Things that help control blood glucose levels include:  Blood glucose monitoring.  Meal planning.  Physical activity.  Diabetes medicine.  Over time, maintaining lower blood glucose levels helps lessen symptoms. Sometimes, prescription pain medicine is needed. Follow these instructions at home:  Do not smoke.  Keep your blood glucose level in the range that you and your health care provider have determined acceptable for you.  Keep your blood pressure level in the range that you and your health care provider have determined acceptable for you.  Eat a well-balanced diet.  Be physically active every day. Include strength training and balance exercises.  Protect your feet. ? Check your feet every day for sores, cuts, blisters, or signs of infection. ? Wear padded socks and supportive shoes. Use orthotic inserts, if necessary. ? Regularly check the insides of your shoes for worn spots. Make sure there are no rocks or other items inside your shoes before you put them on. Contact a health care provider if:  You have burning, stabbing, or aching pain in the legs or feet.  You are unable to feel pressure or pain in your feet.  You develop problems with digestion such as: ? Nausea. ? Vomiting. ? Bloating. ? Constipation. ? Diarrhea. ? Abdominal pain.  You have difficulty with urination, such as: ? Incontinence. ? Retention.    You have palpitations.  You develop orthostatic hypotension. When you stand up you may feel: ? Dizzy. ? Weak. ? Faint.  You cannot attain and maintain an erection (in men).  You have vaginal dryness and problems with decreased sexual desire and arousal (in women).  You have severe pain in your thighs, legs, or  buttocks.  You have unexplained weight loss. This information is not intended to replace advice given to you by your health care provider. Make sure you discuss any questions you have with your health care provider. Document Released: 11/22/2001 Document Revised: 02/19/2016 Document Reviewed: 02/22/2013 Elsevier Interactive Patient Education  2017 [employer] Inc.  

## 2017-07-09 ENCOUNTER — Encounter (HOSPITAL_COMMUNITY): Payer: Self-pay

## 2017-07-09 ENCOUNTER — Emergency Department (HOSPITAL_COMMUNITY)
Admission: EM | Admit: 2017-07-09 | Discharge: 2017-07-09 | Disposition: A | Discharge status: Home / Self Care | Payer: Medicare Other | Attending: Emergency Medicine | Admitting: Emergency Medicine

## 2017-07-09 DIAGNOSIS — S61205A Unspecified open wound of left ring finger without damage to nail, initial encounter: Secondary | ICD-10-CM | POA: Diagnosis not present

## 2017-07-09 DIAGNOSIS — Z79899 Other long term (current) drug therapy: Secondary | ICD-10-CM | POA: Diagnosis not present

## 2017-07-09 DIAGNOSIS — Y929 Unspecified place or not applicable: Secondary | ICD-10-CM | POA: Insufficient documentation

## 2017-07-09 DIAGNOSIS — W5501XA Bitten by cat, initial encounter: Secondary | ICD-10-CM | POA: Diagnosis not present

## 2017-07-09 DIAGNOSIS — Y999 Unspecified external cause status: Secondary | ICD-10-CM | POA: Insufficient documentation

## 2017-07-09 DIAGNOSIS — S61253A Open bite of left middle finger without damage to nail, initial encounter: Secondary | ICD-10-CM | POA: Diagnosis not present

## 2017-07-09 DIAGNOSIS — Z23 Encounter for immunization: Secondary | ICD-10-CM | POA: Insufficient documentation

## 2017-07-09 DIAGNOSIS — S6992XA Unspecified injury of left wrist, hand and finger(s), initial encounter: Secondary | ICD-10-CM | POA: Diagnosis present

## 2017-07-09 DIAGNOSIS — I10 Essential (primary) hypertension: Secondary | ICD-10-CM | POA: Diagnosis not present

## 2017-07-09 DIAGNOSIS — S61259A Open bite of unspecified finger without damage to nail, initial encounter: Secondary | ICD-10-CM

## 2017-07-09 DIAGNOSIS — E119 Type 2 diabetes mellitus without complications: Secondary | ICD-10-CM | POA: Insufficient documentation

## 2017-07-09 DIAGNOSIS — Y9389 Activity, other specified: Secondary | ICD-10-CM | POA: Insufficient documentation

## 2017-07-09 MED ORDER — TETANUS-DIPHTH-ACELL PERTUSSIS 5-2.5-18.5 LF-MCG/0.5 IM SUSP
0.5000 mL | Freq: Once | INTRAMUSCULAR | Status: AC
  Administered 2017-07-09: 0.5 mL via INTRAMUSCULAR
  Filled 2017-07-09: qty 0.5

## 2017-07-09 MED ORDER — AMOXICILLIN-POT CLAVULANATE 875-125 MG PO TABS: 1.0000 | ORAL_TABLET | Freq: Two times a day (BID) | ORAL | 0 refills | Status: DC

## 2017-07-09 NOTE — ED Triage Notes (Signed)
Pt presents with laceration to L 3rd finger from cat bite at [facility] where he [facility].

## 2017-07-09 NOTE — Discharge Instructions (Signed)
Keep finger clean and dry. Augmentin as prescribed until all gone for prophylaxis of infection. Ibuprofen or tylenol for pain. Watch for any signs of infection. Follow up in two days for recheck. Return if worsening pain, swelling, drainage, fever, any new concerning symptoms.

## 2017-07-09 NOTE — ED Provider Notes (Signed)
[facility] DEPT Provider Note   CSN: [id number] Arrival date & time: 07/09/17  1720     History   Chief Complaint No chief complaint on file.   HPI Albert Mcdonald is a 46 y.o. male.  HPI Albert Mcdonald is a 46 y.o. male presents to emergency department complaining of a cat bite to the finger. Patient states that he was volunteering at the animal shelter with a farrell cat bit him on the left ring finger. Patient reports laceration to the distal fingertip. He denies any pain to the finger. No difficulty moving. States that they're not sure if RABIES are up-to-date but cat Is under quarantine by the animal shelter. States he is not sure of his tetanus status.  No any other complaints.   Past Medical History:  Diagnosis Date  . ALS (amyotrophic lateral sclerosis) ([place])    patient denied on 11/01/12 -- has KLS not ALS  . Diabetes mellitus   . Family history of anesthesia complication   . Hyperlipidemia   . Hypertension   . Kleine-Levin syndrome   . OCD (obsessive compulsive disorder)   . Restless leg     Patient Active Problem List   Diagnosis Date Noted  . Diabetic neuropathy ([place]) 07/08/2017  . Acute encephalopathy 03/15/2017  . Drug overdose 03/14/2017  . Acute metabolic encephalopathy 40/81/4481  . Polysubstance abuse ([place]) 03/14/2017  . Cocaine abuse ([place])   . Type 2 diabetes mellitus ([place])   . Hyperlipidemia   . Hypertension   . Restless leg   . Family history of anesthesia complication   . Cervical spondylosis with myelopathy 09/29/2012  . OCD (obsessive compulsive disorder) 03/16/2011  . 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 HLD (hyperlipidemia) 03/16/2011    Past Surgical History:  Procedure Laterality Date  . ANTERIOR CERVICAL DECOMP/DISCECTOMY FUSION  11/09/2012   Procedure: ANTERIOR CERVICAL DECOMPRESSION/DISCECTOMY FUSION 3 LEVELS C4-C7 ;  Surgeon: Melina Schools, MD;  Location: [facility];  Service: Orthopedics;;  ACDF C4-C7        Home  Medications    Prior to Admission medications   Medication Sig Start Date End Date Taking? Authorizing Provider  amLODipine (NORVASC) 10 MG tablet Take 1 tablet (10 mg total) by mouth daily. 06/01/17   Brayton Caves, PA-C  Blood Glucose Monitoring Suppl (ACCU-CHEK AVIVA) device Use as instructed daily. 07/08/17   Arnoldo Morale, MD  folic acid (FOLVITE) 1 MG tablet Take 1 tablet (1 mg total) by mouth daily. Patient not taking: Reported on 06/01/2017 03/17/17   Damita Lack, MD  gabapentin (NEURONTIN) 300 MG capsule Take 1 capsule (300 mg total) by mouth 2 (two) times daily. 06/01/17   Brayton Caves, PA-C  glucose blood (ACCU-CHEK AVIVA) test strip Use as instructed daily 07/08/17   Arnoldo Morale, MD  Lancet Devices [facility]) lancets Use as instructed daily. 07/08/17   Arnoldo Morale, MD  meloxicam (MOBIC) 7.5 MG tablet Take 1 tablet (7.5 mg total) by mouth daily. 07/08/17   Arnoldo Morale, MD  metFORMIN (GLUCOPHAGE) 500 MG tablet Take 1 tablet (500 mg total) by mouth 2 (two) times daily with a meal. 06/01/17   Brayton Caves, PA-C  Multiple Vitamin (MULTIVITAMIN WITH MINERALS) TABS tablet Take 1 tablet by mouth daily. Patient not taking: Reported on 06/01/2017 03/17/17   Damita Lack, MD  thiamine 100 MG tablet Take 1 tablet (100 mg total) by mouth daily. Patient not taking: Reported on 06/01/2017 03/17/17   Damita Lack, MD  Family History History reviewed. No pertinent family history.  Social History Social History  Substance Use Topics  . Smoking status: Never Smoker  . Smokeless tobacco: Never Used  . Alcohol use Yes     Comment: once every couple months     Allergies   Ace inhibitors   Review of Systems Review of Systems  Constitutional: Negative for chills and fever.  Respiratory: Negative for cough, chest tightness and shortness of breath.   Cardiovascular: Negative for chest pain, palpitations and leg swelling.  Genitourinary: Negative for hematuria.   Musculoskeletal: Positive for arthralgias. Negative for myalgias, neck pain and neck stiffness.  Skin: Positive for wound. Negative for rash.  Allergic/Immunologic: Negative for immunocompromised state.  All other systems reviewed and are negative.    Physical Exam Updated Vital Signs BP (!) 157/99   Pulse 89   Temp 98.3 F (36.8 C) (Oral)   Resp 18   Wt 107.5 kg (237 lb)   SpO2 97%   BMI 31.27 kg/m   Physical Exam  Constitutional: He appears well-developed and well-nourished. No distress.  Eyes: Conjunctivae are normal.  Neck: Neck supple.  Cardiovascular: Normal rate.   Pulmonary/Chest: No respiratory distress.  Abdominal: He exhibits no distension.  Musculoskeletal:  2 cm mildly gaping laceration to the distal fingertip of the left ring finger. No nail involvement. Full range of motion of the finger at each joint with flexion and extension. Strength is intact. Capillary refill less than 2 seconds distally.  Skin: Skin is warm and dry.  Nursing note and vitals reviewed.    ED Treatments / Results  Labs (all labs ordered are listed, but only abnormal results are displayed) Labs Reviewed - No data to display  EKG  EKG Interpretation None       Radiology No results found.  Procedures Procedures (including critical care time)  Medications Ordered in ED Medications - No data to display   Initial Impression / Assessment and Plan / ED Course  I have reviewed the triage vital signs and the nursing notes.  Pertinent labs & imaging results that were available during my care of the patient were reviewed by me and considered in my medical decision making (see chart for details).      Patient with cat bite wound to the tip of the left ring finger. He was extensively irrigated with saline, was soaked with iodine and saline, and it was scrubbed with iodine surgical brush for 5 minutes. I do not suspect bony involvement or any foreign body based on my examination.  Wound was not repaird, but approximated with sterri strips. Since the cat is in the custody of the shelter, will need quaranteen and patient does not need to start stat rabies prophylaxis and immunization. Will update patient's tetanus. Will start on Augmentin. Will have him follow-up closely for recheck. Discussed importance of keeping it clean, antibiotics, and the fact that these wounds can easily get infected. Patient voiced understanding. Return precautions discussed.  Vitals:   07/09/17 1724 07/09/17 1812  BP: (!) 157/99   Pulse: 89   Resp: 18   Temp: 98.3 F (36.8 C)   TempSrc: Oral   SpO2: 97%   Weight:  107.5 kg (237 lb)    Final Clinical Impressions(s) / ED Diagnoses   Final diagnoses:  Cat bite of finger, initial encounter    New Prescriptions New Prescriptions   No medications on file     Jeannett Senior, Hershal Coria 07/09/17 2108    Milton Ferguson,  MD 07/09/17 2329

## 2017-07-09 NOTE — ED Notes (Signed)
Declined W/C at D/C and was escorted to lobby by RN. 

## 2017-07-11 ENCOUNTER — Other Ambulatory Visit: Payer: Self-pay | Admitting: Pharmacist

## 2017-07-11 MED ORDER — ACCU-CHEK SOFTCLIX LANCET DEV MISC: 5 refills | Status: DC

## 2017-07-11 MED ORDER — ACCU-CHEK AVIVA DEVI: 0 refills | Status: DC

## 2017-07-11 MED ORDER — ACCU-CHEK SOFTCLIX LANCETS MISC: 12 refills | Status: DC

## 2017-07-11 MED ORDER — GLUCOSE BLOOD VI STRP: ORAL_STRIP | 12 refills | Status: DC

## 2017-08-09 ENCOUNTER — Ambulatory Visit: Payer: Medicare Other | Attending: Family Medicine | Admitting: Family Medicine

## 2017-08-09 ENCOUNTER — Encounter: Payer: Self-pay | Admitting: Family Medicine

## 2017-08-09 VITALS — BP 144/93 | HR 68 | Temp 98.6°F | Resp 16 | Wt 241.0 lb

## 2017-08-09 DIAGNOSIS — Z79899 Other long term (current) drug therapy: Secondary | ICD-10-CM | POA: Insufficient documentation

## 2017-08-09 DIAGNOSIS — G4713 Recurrent hypersomnia: Secondary | ICD-10-CM | POA: Diagnosis not present

## 2017-08-09 DIAGNOSIS — Z7984 Long term (current) use of oral hypoglycemic drugs: Secondary | ICD-10-CM | POA: Insufficient documentation

## 2017-08-09 DIAGNOSIS — F429 Obsessive-compulsive disorder, unspecified: Secondary | ICD-10-CM | POA: Insufficient documentation

## 2017-08-09 DIAGNOSIS — G2581 Restless legs syndrome: Secondary | ICD-10-CM | POA: Diagnosis not present

## 2017-08-09 DIAGNOSIS — L245 Irritant contact dermatitis due to other chemical products: Secondary | ICD-10-CM

## 2017-08-09 DIAGNOSIS — L249 Irritant contact dermatitis, unspecified cause: Secondary | ICD-10-CM | POA: Insufficient documentation

## 2017-08-09 DIAGNOSIS — F840 Autistic disorder: Secondary | ICD-10-CM | POA: Diagnosis not present

## 2017-08-09 DIAGNOSIS — E1169 Type 2 diabetes mellitus with other specified complication: Secondary | ICD-10-CM

## 2017-08-09 DIAGNOSIS — F331 Major depressive disorder, recurrent, moderate: Secondary | ICD-10-CM | POA: Insufficient documentation

## 2017-08-09 DIAGNOSIS — Z880 Allergy status to penicillin: Secondary | ICD-10-CM | POA: Insufficient documentation

## 2017-08-09 DIAGNOSIS — E785 Hyperlipidemia, unspecified: Secondary | ICD-10-CM | POA: Insufficient documentation

## 2017-08-09 DIAGNOSIS — F32A Depression, unspecified: Secondary | ICD-10-CM | POA: Insufficient documentation

## 2017-08-09 DIAGNOSIS — I10 Essential (primary) hypertension: Secondary | ICD-10-CM | POA: Diagnosis not present

## 2017-08-09 DIAGNOSIS — E1149 Type 2 diabetes mellitus with other diabetic neurological complication: Secondary | ICD-10-CM

## 2017-08-09 DIAGNOSIS — F329 Major depressive disorder, single episode, unspecified: Secondary | ICD-10-CM | POA: Insufficient documentation

## 2017-08-09 LAB — GLUCOSE, POCT (MANUAL RESULT ENTRY): POC Glucose: 160 mg/dl — AB (ref 70–99)

## 2017-08-09 MED ORDER — TRIAMCINOLONE ACETONIDE 0.1 % EX CREA: 1.0000 "application " | TOPICAL_CREAM | Freq: Two times a day (BID) | CUTANEOUS | 1 refills | Status: DC

## 2017-08-09 MED ORDER — FLUOXETINE HCL 20 MG PO TABS: 20.0000 mg | ORAL_TABLET | Freq: Every day | ORAL | 3 refills | Status: DC

## 2017-08-09 NOTE — Patient Instructions (Signed)
Persistent Depressive Disorder Persistent depressive disorder (PDD) is a mental health condition. PDD causes symptoms of low-level depression for 2 years or longer. It may also be called long-term (chronic) depression or dysthymia. PDD may include episodes of more severe depression that last for about 2 weeks (major depressive disorder or MDD). PDD can affect the way you think, feel, and sleep. This condition may also affect your relationships. You may be more likely to get sick if you have PDD. Symptoms of PDD occur for most of the day and may include:  Feeling tired (fatigue).  Low energy.  Eating too much or too little.  Sleeping too much or too little.  Feeling restless or agitated.  Feeling hopeless.  Feeling worthless or guilty.  Feeling worried or nervous (anxiety).  Trouble concentrating or making decisions.  Low self-esteem.  A negative way of looking at things ([place]).  Not being able to have fun or feel pleasure.  Avoiding interacting with people.  Getting angry or annoyed easily (irritability).  Acting aggressive or angry.  Follow these instructions at home: Activity  Go back to your normal activities as told by your doctor.  Exercise regularly as told by your doctor. General instructions  Take over-the-counter and prescription medicines only as told by your doctor.  Do not drink alcohol. Or, limit how much alcohol you drink to no more than 1 drink a day for nonpregnant women and 2 drinks a day for men. One drink equals 12 oz of beer, 5 oz of wine, or 1 oz of hard liquor. Alcohol can affect any antidepressant medicines you are taking. Talk with your doctor about your alcohol use.  Eat a healthy diet and get plenty of sleep.  Find activities that you enjoy each day.  Consider joining a support group. Your doctor may be able to suggest a support group.  Keep all follow-up visits as told by your doctor. This is important. Where to find more  information: National Alliance on Mental Illness  www.nami.org  U.[place]. National Institute of Mental Health  www.nimh.nih.gov  National Suicide Prevention Lifeline  1-800-273-TALK (1-800-273-8255). This is free, 24-hour help.  Contact a doctor if:  Your symptoms get worse.  You have new symptoms.  You have trouble sleeping or doing your daily activities. Get help right away if:  You self-harm.  You have serious thoughts about hurting yourself or others.  You see, hear, taste, smell, or feel things that are not there (hallucinate). This information is not intended to replace advice given to you by your health care provider. Make sure you discuss any questions you have with your health care provider. Document Released: 08/25/2015 Document Revised: 05/07/2016 Document Reviewed: 05/07/2016 Elsevier Interactive Patient Education  2017 [employer]vier Inc.  

## 2017-08-09 NOTE — Progress Notes (Signed)
Subjective:  Patient ID: Albert Mcdonald, male    DOB: 1971/03/26  Age: 46 y.o. MRN: [id number]  CC: Diabetes and Rash   HPI Albert Mcdonald  is a 46 year old male with a history of type 2 diabetes mellitus (A1c 6.9), Cathlean Marseilles syndrome, autism, hypertension who presents for a follow-up visit today.  He is accompanied by his mother and informs me he will be moving from [place] to [facility] for his inpatient rehab. He has a note with him stating he has been depressed and might benefit from medications. His PHQ 9 score is 22 today; he denies suicidal ideations or intent at this time but has had a previous history of suicidal attempt via medication overdose which necessitated admission to rehab.  He complains of a nonpruritic rash on his right forearm for the last 1 week but no rash in other body parts.  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 He applied bacitracin ointment with no relief in symptoms.  Regards to his diabetes he has been compliant with his medications.  Past Medical History:  Diagnosis Date  . ALS (amyotrophic lateral sclerosis) ([place])    patient denied on 11/01/12 -- has KLS not ALS  . Diabetes mellitus   . Family history of anesthesia complication   . Hyperlipidemia   . Hypertension   . Kleine-Levin syndrome   . OCD (obsessive compulsive disorder)   . Restless leg     History reviewed. No pertinent surgical history.  Allergies  Allergen Reactions  . Ace Inhibitors Rash     Outpatient Medications Prior to Visit  Medication Sig Dispense Refill  . ACCU-CHEK SOFTCLIX LANCETS lancets Use as instructed once daily. E11.9 100 each 12  . amLODipine (NORVASC) 10 MG tablet Take 1 tablet (10 mg total) by mouth daily. 90 tablet 1  . amoxicillin-clavulanate (AUGMENTIN) 875-125 MG tablet Take 1 tablet by mouth every 12 (twelve) hours. (Patient not taking: Reported on 08/09/2017) 14 tablet 0  . Blood Glucose Monitoring Suppl (ACCU-CHEK AVIVA) device Use as  instructed daily. I50.2 1 each 0  . folic acid (FOLVITE) 1 MG tablet Take 1 tablet (1 mg total) by mouth daily. (Patient not taking: Reported on 06/01/2017) 30 tablet 1  . gabapentin (NEURONTIN) 300 MG capsule Take 1 capsule (300 mg total) by mouth 2 (two) times daily. 180 capsule 1  . glucose blood (ACCU-CHEK AVIVA) test strip Use as instructed daily. E11.9 100 each 12  . Lancet Devices (ACCU-CHEK SOFTCLIX) lancets Use as instructed daily.E11.9 1 each 5  . meloxicam (MOBIC) 7.5 MG tablet Take 1 tablet (7.5 mg total) by mouth daily. 30 tablet 2  . metFORMIN (GLUCOPHAGE) 500 MG tablet Take 1 tablet (500 mg total) by mouth 2 (two) times daily with a meal. 180 tablet 1  . Multiple Vitamin (MULTIVITAMIN WITH MINERALS) TABS tablet Take 1 tablet by mouth daily. (Patient not taking: Reported on 06/01/2017) 30 tablet 1  . thiamine 100 MG tablet Take 1 tablet (100 mg total) by mouth daily. (Patient not taking: Reported on 06/01/2017) 30 tablet 1   No facility-administered medications prior to visit.     ROS Review of Systems  Constitutional: Negative for activity change and appetite change.  HENT: Negative for sinus pressure and sore throat.   Eyes: Negative for visual disturbance.  Respiratory: Negative for cough, chest tightness and shortness of breath.   Cardiovascular: Negative for chest pain and leg swelling.  Gastrointestinal: Negative for abdominal distention, abdominal pain, constipation and diarrhea.  Endocrine: Negative.   Genitourinary: Negative for dysuria.  Musculoskeletal: Negative for joint swelling and myalgias.  Skin: Positive for rash.  Allergic/Immunologic: Negative.   Neurological: Negative for weakness, light-headedness and numbness.  Psychiatric/Behavioral: Negative for dysphoric mood and suicidal ideas.    Objective:  BP (!) 144/93   Pulse 68   Temp 98.6 F (37 C) (Oral)   Resp 16   Wt 241 lb (109.3 kg)   SpO2 98%   BMI 31.80 kg/m   BP/Weight 08/09/2017 07/09/2017  35/00/9381  Systolic BP 829 937 169  Diastolic BP 93 99 91  Wt. (Lbs) 241 237 237  BMI 31.8 31.27 31.27      Physical Exam  Constitutional: He is oriented to person, place, and time. He appears well-developed and well-nourished.  Cardiovascular: Normal rate, normal heart sounds and intact distal pulses.  No murmur heard. Pulmonary/Chest: Effort normal and breath sounds normal. He has no wheezes. He has no rales. He exhibits no tenderness.  Abdominal: Soft. Bowel sounds are normal. He exhibits no distension and no mass. There is no tenderness.  Musculoskeletal: Normal range of motion.  Neurological: He is alert and oriented to person, place, and time.  Skin:  Erythematous punctate rash on flexor aspect of right forearm  Psychiatric: He has a normal mood and affect.    Depression screen PHQ 2/9 08/09/2017  Decreased Interest 1  Down, Depressed, Hopeless 3  PHQ - 2 Score 4  Altered sleeping 3  Tired, decreased energy 3  Change in appetite 3  Feeling bad or failure about yourself  0  Trouble concentrating 3  Moving slowly or fidgety/restless 3  Suicidal thoughts 3  PHQ-9 Score 22     Lab Results  Component Value Date   HGBA1C 6.9 07/08/2017    Assessment & Plan:   1. Type 2 diabetes mellitus with other specified complication, without long-term current use of insulin (HCC) Controlled with A1c of 6.9 Continue current regimen - POCT glucose (manual entry)  2. Other diabetic neurological complication associated with type 2 diabetes mellitus ([place]) Controlled on gabapentin  3. Moderate episode of recurrent major depressive disorder (HCC) Uncontrolled -elevated PHQ 9 score of 22 Previous history of suicidal attempts; not in crisis at this time Currently in rehab Commence Prozac; he will receive therapy while in inpatient rehab. - FLUoxetine (PROZAC) 20 MG tablet; Take 1 tablet (20 mg total) daily by mouth.  Dispense: 30 tablet; Refill: 3  4. Irritant contact dermatitis  due to other chemical products Could be secondary to the chemicals that he uses at his job Advised to use gloves - triamcinolone cream (KENALOG) 0.1 %; Apply 1 application 2 (two) times daily topically.  Dispense: 45 g; Refill: 1   Meds ordered this encounter  Medications  . FLUoxetine (PROZAC) 20 MG tablet    Sig: Take 1 tablet (20 mg total) daily by mouth.    Dispense:  30 tablet    Refill:  3  . triamcinolone cream (KENALOG) 0.1 %    Sig: Apply 1 application 2 (two) times daily topically.    Dispense:  45 g    Refill:  1    Follow-up: Return in about 1 month (around 09/08/2017) for follow up of Depression with Jasmine; 3 months with PCP.   Arnoldo Morale MD

## 2017-08-10 ENCOUNTER — Encounter: Payer: Self-pay | Admitting: Family Medicine

## 2017-09-08 ENCOUNTER — Other Ambulatory Visit: Payer: Medicare Other

## 2017-09-08 ENCOUNTER — Institutional Professional Consult (permissible substitution): Payer: Medicare Other | Admitting: Licensed Clinical Social Worker

## 2017-11-01 DIAGNOSIS — E7800 Pure hypercholesterolemia, unspecified: Secondary | ICD-10-CM | POA: Diagnosis not present

## 2017-11-01 DIAGNOSIS — I10 Essential (primary) hypertension: Secondary | ICD-10-CM | POA: Diagnosis not present

## 2017-11-01 DIAGNOSIS — E119 Type 2 diabetes mellitus without complications: Secondary | ICD-10-CM | POA: Diagnosis not present

## 2017-11-01 DIAGNOSIS — E6609 Other obesity due to excess calories: Secondary | ICD-10-CM | POA: Diagnosis not present

## 2017-11-09 ENCOUNTER — Ambulatory Visit: Payer: Medicare Other | Admitting: Family Medicine

## 2017-11-14 DIAGNOSIS — I10 Essential (primary) hypertension: Secondary | ICD-10-CM | POA: Diagnosis not present

## 2017-11-22 DIAGNOSIS — I10 Essential (primary) hypertension: Secondary | ICD-10-CM | POA: Diagnosis not present

## 2017-11-22 DIAGNOSIS — E781 Pure hyperglyceridemia: Secondary | ICD-10-CM | POA: Diagnosis not present

## 2017-11-22 DIAGNOSIS — E119 Type 2 diabetes mellitus without complications: Secondary | ICD-10-CM | POA: Diagnosis not present

## 2017-11-22 DIAGNOSIS — E6609 Other obesity due to excess calories: Secondary | ICD-10-CM | POA: Diagnosis not present

## 2018-01-23 DIAGNOSIS — I10 Essential (primary) hypertension: Secondary | ICD-10-CM | POA: Diagnosis not present

## 2018-01-23 DIAGNOSIS — J309 Allergic rhinitis, unspecified: Secondary | ICD-10-CM | POA: Diagnosis not present

## 2018-01-23 DIAGNOSIS — F909 Attention-deficit hyperactivity disorder, unspecified type: Secondary | ICD-10-CM | POA: Diagnosis not present

## 2018-01-23 DIAGNOSIS — E781 Pure hyperglyceridemia: Secondary | ICD-10-CM | POA: Diagnosis not present

## 2018-03-01 DIAGNOSIS — F900 Attention-deficit hyperactivity disorder, predominantly inattentive type: Secondary | ICD-10-CM | POA: Diagnosis not present

## 2018-03-01 DIAGNOSIS — I10 Essential (primary) hypertension: Secondary | ICD-10-CM | POA: Diagnosis not present

## 2018-03-01 DIAGNOSIS — E119 Type 2 diabetes mellitus without complications: Secondary | ICD-10-CM | POA: Diagnosis not present

## 2018-03-01 DIAGNOSIS — E7800 Pure hypercholesterolemia, unspecified: Secondary | ICD-10-CM | POA: Diagnosis not present

## 2018-07-12 DIAGNOSIS — F419 Anxiety disorder, unspecified: Secondary | ICD-10-CM | POA: Diagnosis not present

## 2018-09-18 DIAGNOSIS — H9202 Otalgia, left ear: Secondary | ICD-10-CM | POA: Diagnosis not present

## 2018-09-18 DIAGNOSIS — H6122 Impacted cerumen, left ear: Secondary | ICD-10-CM | POA: Diagnosis not present

## 2019-06-22 ENCOUNTER — Emergency Department (HOSPITAL_COMMUNITY)
Admission: EM | Admit: 2019-06-22 | Discharge: 2019-06-22 | Disposition: A | Discharge status: Home / Self Care | Payer: Medicare Other | Attending: Emergency Medicine | Admitting: Emergency Medicine

## 2019-06-22 ENCOUNTER — Emergency Department (HOSPITAL_COMMUNITY): Payer: Medicare Other

## 2019-06-22 DIAGNOSIS — I10 Essential (primary) hypertension: Secondary | ICD-10-CM | POA: Diagnosis not present

## 2019-06-22 DIAGNOSIS — Z7984 Long term (current) use of oral hypoglycemic drugs: Secondary | ICD-10-CM | POA: Insufficient documentation

## 2019-06-22 DIAGNOSIS — R0789 Other chest pain: Secondary | ICD-10-CM | POA: Diagnosis not present

## 2019-06-22 DIAGNOSIS — Z87891 Personal history of nicotine dependence: Secondary | ICD-10-CM | POA: Diagnosis not present

## 2019-06-22 DIAGNOSIS — R079 Chest pain, unspecified: Secondary | ICD-10-CM | POA: Diagnosis not present

## 2019-06-22 DIAGNOSIS — E119 Type 2 diabetes mellitus without complications: Secondary | ICD-10-CM | POA: Insufficient documentation

## 2019-06-22 DIAGNOSIS — R05 Cough: Secondary | ICD-10-CM | POA: Diagnosis not present

## 2019-06-22 DIAGNOSIS — Z79899 Other long term (current) drug therapy: Secondary | ICD-10-CM | POA: Insufficient documentation

## 2019-06-22 LAB — BASIC METABOLIC PANEL
Anion gap: 10 (ref 5–15)
BUN: 15 mg/dL (ref 6–20)
CO2: 24 mmol/L (ref 22–32)
Calcium: 9.4 mg/dL (ref 8.9–10.3)
Chloride: 101 mmol/L (ref 98–111)
Creatinine, Ser: 0.87 mg/dL (ref 0.61–1.24)
GFR calc Af Amer: >60 mL/min (ref >60)
GFR calc non Af Amer: >60 mL/min (ref >60)
Glucose, Bld: 343 mg/dL — ABNORMAL HIGH (ref 70–99)
Potassium: 4.1 mmol/L (ref 3.5–5.1)
Sodium: 135 mmol/L (ref 135–145)

## 2019-06-22 LAB — CBC
HCT: 48.5 % (ref 39.0–52.0)
Hemoglobin: 16.8 g/dL (ref 13.0–17.0)
MCH: 29.5 pg (ref 26.0–34.0)
MCHC: 34.6 g/dL (ref 30.0–36.0)
MCV: 85.2 fL (ref 80.0–100.0)
Platelets: 107 10*3/uL — ABNORMAL LOW (ref 150–400)
RBC: 5.69 MIL/uL (ref 4.22–5.81)
RDW: 11.9 % (ref 11.5–15.5)
WBC: 4.7 10*3/uL (ref 4.0–10.5)
nRBC: 0 % (ref 0.0–0.2)

## 2019-06-22 LAB — TROPONIN I (HIGH SENSITIVITY)
Troponin I (High Sensitivity): 6 ng/L (ref <18)
Troponin I (High Sensitivity): 5 ng/L (ref <18)

## 2019-06-22 LAB — CBG MONITORING, ED
Glucose-Capillary: 242 mg/dL — ABNORMAL HIGH (ref 70–99)
Glucose-Capillary: 250 mg/dL — ABNORMAL HIGH (ref 70–99)

## 2019-06-22 MED ORDER — INSULIN ASPART 100 UNIT/ML ~~LOC~~ SOLN
5.0000 [IU] | Freq: Once | SUBCUTANEOUS | Status: AC
  Administered 2019-06-22: 14:00:00 5 [IU] via SUBCUTANEOUS

## 2019-06-22 MED ORDER — SODIUM CHLORIDE 0.9% FLUSH
3.0000 mL | Freq: Once | INTRAVENOUS | Status: AC
  Administered 2019-06-22: 3 mL via INTRAVENOUS

## 2019-06-22 MED ORDER — BENZONATATE 100 MG PO CAPS: 100.0000 mg | ORAL_CAPSULE | Freq: Three times a day (TID) | ORAL | 0 refills | Status: DC

## 2019-06-22 MED ORDER — MORPHINE SULFATE (PF) 4 MG/ML IV SOLN
4.0000 mg | Freq: Once | INTRAVENOUS | Status: AC
  Administered 2019-06-22: 4 mg via INTRAVENOUS
  Filled 2019-06-22: qty 1

## 2019-06-22 NOTE — ED Triage Notes (Signed)
Pt reports substernal CP while lying down. Reports pain is worse with movement and deep breathing. Pain is non radiating. NAD at present.

## 2019-06-22 NOTE — ED Notes (Signed)
Patient Alert and oriented to baseline. Stable and ambulatory to baseline. Patient verbalized understanding of the discharge instructions.  Patient belongings were taken by the patient.   

## 2019-06-22 NOTE — Discharge Instructions (Signed)
Take over the counter tylenol or ibuprofen as needed for your chest pain.  Take tessalon perles for cough.  Call and follow up with your doctor for further care.

## 2019-06-22 NOTE — ED Provider Notes (Signed)
[facility] EMERGENCY DEPARTMENT Provider Note   CSN: [id number] Arrival date & time: 06/22/19  1013     History   Chief Complaint Chief Complaint  Patient presents with  . Chest Pain  . Cough    HPI Albert Mcdonald is a 48 y.o. male.     The history is provided by the patient and medical records. No language interpreter was used.  Chest Pain Associated symptoms: cough   Cough Associated symptoms: chest pain      48 year old male with history of diabetes, hypertension, hyperlipidemia, polysubstance abuse, including cocaine abuse presenting for evaluation of chest pain.  Patient report he has had a recurrent nonproductive cough for the past 3 to 4 months.  For the past 3 to 4 days he has noticing pain to his chest wall.  Pain is described as a sharp sensation occasionally radiates to his midsternal region, and occasionally down his left arm.  Pain is more noticeable when he takes a deep breath, or when he lays flat and seems to improve when he moves around.  Rates pain a 6 out of 10 currently.  Endorse occasional bouts of lightheadedness.  He denies any associated fever chills no runny nose sneezing sore throat body aches, loss of taste or smell, increased shortness of breath, back pain abdominal pain.  He does admits to perform heavy lifting the past week which he felt may contribute to the pain.  He denies any postprandial pain.  He denies any recent sick contact.  He did try taking some Allegra pills without relief.  He denies any recent alcohol or drug use and states he has quit smoking for several years.  He denies any strong family history of cardiac disease.  He denies any recent sick contact with anyone with COVID-19.  Past Medical History:  Diagnosis Date  . ALS (amyotrophic lateral sclerosis) ([place])    patient denied on 11/01/12 -- has KLS not ALS  . Diabetes mellitus   . Family history of anesthesia complication   . Hyperlipidemia   . Hypertension   .  Kleine-Levin syndrome   . OCD (obsessive compulsive disorder)   . Restless leg     Patient Active Problem List   Diagnosis Date Noted  . Depression 08/09/2017  . Diabetic neuropathy ([place]) 07/08/2017  . Acute encephalopathy 03/15/2017  . Drug overdose 03/14/2017  . Acute metabolic encephalopathy [id number]  . Polysubstance abuse ([place]) 03/14/2017  . Cocaine abuse ([place])   . Type 2 diabetes mellitus ([place])   . Hyperlipidemia   . Hypertension   . Restless leg   . Family history of anesthesia complication   . Cervical spondylosis with myelopathy 09/29/2012  . OCD (obsessive compulsive disorder) 03/16/2011  . 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 HLD (hyperlipidemia) 03/16/2011    Past Surgical History:  Procedure Laterality Date  . ANTERIOR CERVICAL DECOMP/DISCECTOMY FUSION  11/09/2012   Procedure: ANTERIOR CERVICAL DECOMPRESSION/DISCECTOMY FUSION 3 LEVELS C4-C7 ;  Surgeon: Melina Schools, MD;  Location: [facility];  Service: Orthopedics;;  ACDF C4-C7         Home Medications    Prior to Admission medications   Medication Sig Start Date End Date Taking? Authorizing Provider  ACCU-CHEK SOFTCLIX LANCETS lancets Use as instructed once daily. E11.9 07/11/17   Tresa Garter, MD  amLODipine (NORVASC) 10 MG tablet Take 1 tablet (10 mg total) by mouth daily. 06/01/17   Brayton Caves, PA-C  amoxicillin-clavulanate (AUGMENTIN) 875-125 MG tablet Take 1 tablet by  mouth every 12 (twelve) hours. Patient not taking: Reported on 08/09/2017 07/09/17   Jeannett Senior, PA-C  Blood Glucose Monitoring Suppl (ACCU-CHEK AVIVA) device Use as instructed daily. E11.9 07/11/17   Tresa Garter, MD  FLUoxetine (PROZAC) 20 MG tablet Take 1 tablet (20 mg total) daily by mouth. 08/09/17   Charlott Rakes, MD  folic acid (FOLVITE) 1 MG tablet Take 1 tablet (1 mg total) by mouth daily. Patient not taking: Reported on 06/01/2017 03/17/17   Damita Lack, MD  gabapentin (NEURONTIN) 300 MG capsule Take  1 capsule (300 mg total) by mouth 2 (two) times daily. 06/01/17   Brayton Caves, PA-C  glucose blood (ACCU-CHEK AVIVA) test strip Use as instructed daily. E11.9 07/11/17   Tresa Garter, MD  Lancet Devices [facility]) lancets Use as instructed daily.E11.9 07/11/17   Charlott Rakes, MD  meloxicam (MOBIC) 7.5 MG tablet Take 1 tablet (7.5 mg total) by mouth daily. 07/08/17   Charlott Rakes, MD  metFORMIN (GLUCOPHAGE) 500 MG tablet Take 1 tablet (500 mg total) by mouth 2 (two) times daily with a meal. 06/01/17   Brayton Caves, PA-C  Multiple Vitamin (MULTIVITAMIN WITH MINERALS) TABS tablet Take 1 tablet by mouth daily. Patient not taking: Reported on 06/01/2017 03/17/17   Damita Lack, MD  thiamine 100 MG tablet Take 1 tablet (100 mg total) by mouth daily. Patient not taking: Reported on 06/01/2017 03/17/17   Damita Lack, MD  triamcinolone cream (KENALOG) 0.1 % Apply 1 application 2 (two) times daily topically. 08/09/17   Charlott Rakes, MD    Family History No family history on file.  Social History Social History   Tobacco Use  . Smoking status: Never Smoker  . Smokeless tobacco: Never Used  Substance Use Topics  . Alcohol use: Yes    Comment: once every couple months  . Drug use: No    Comment: former drug abuse     Allergies   Ace inhibitors   Review of Systems Review of Systems  Respiratory: Positive for cough.   Cardiovascular: Positive for chest pain.  All other systems reviewed and are negative.    Physical Exam Updated Vital Signs BP (!) 148/113 (BP Location: Left Arm)   Pulse 88   Temp 98.7 F (37.1 C) (Oral)   Resp 17   SpO2 97%   Physical Exam Vitals signs and nursing note reviewed.  Constitutional:      General: He is not in acute distress.    Appearance: He is well-developed.  HENT:     Head: Atraumatic.  Eyes:     Conjunctiva/sclera: Conjunctivae normal.  Neck:     Musculoskeletal: Neck supple.  Cardiovascular:      Rate and Rhythm: Normal rate and regular rhythm.     Heart sounds: Normal heart sounds.  Pulmonary:     Effort: Pulmonary effort is normal.     Breath sounds: Normal breath sounds. No decreased breath sounds, wheezing, rhonchi or rales.  Chest:     Chest wall: Tenderness (Mild tenderness to anterior chest wall without crepitus or emphysema.) present.  Abdominal:     Palpations: Abdomen is soft.     Tenderness: There is no abdominal tenderness.  Musculoskeletal:     Right lower leg: No edema.     Left lower leg: No edema.  Skin:    Findings: No rash.  Neurological:     Mental Status: He is alert.      ED Treatments / Results  Labs (all labs ordered are listed, but only abnormal results are displayed) Labs Reviewed  BASIC METABOLIC PANEL - Abnormal; Notable for the following components:      Result Value   Glucose, Bld 343 (*)    All other components within normal limits  CBC - Abnormal; Notable for the following components:   Platelets 107 (*)    All other components within normal limits  CBG MONITORING, ED - Abnormal; Notable for the following components:   Glucose-Capillary 250 (*)    All other components within normal limits  CBG MONITORING, ED - Abnormal; Notable for the following components:   Glucose-Capillary 242 (*)    All other components within normal limits  TROPONIN I (HIGH SENSITIVITY)  TROPONIN I (HIGH SENSITIVITY)    EKG EKG Interpretation  Date/Time:  Friday June 22 2019 10:36:28 EDT Ventricular Rate:  92 PR Interval:  168 QRS Duration: 108 QT Interval:  370 QTC Calculation: 457 R Axis:   -42 Text Interpretation:  Normal sinus rhythm Left axis deviation Cannot rule out Anterior infarct , age undetermined Abnormal ECG No acute changes No significant change since last tracing Confirmed by Varney Biles [id number]) on 06/22/2019 12:48:48 PM   Radiology Dg Chest 2 View  Result Date: 06/22/2019 CLINICAL DATA:  Chest pain and cough EXAM: CHEST - 2  VIEW COMPARISON:  March 14, 2017 FINDINGS: Lungs are clear. Heart size and pulmonary vascularity are normal. No adenopathy. Postoperative change noted in the lower cervical region. IMPRESSION: No edema or consolidation. Electronically Signed   By: Lowella Grip III M.D.   On: 06/22/2019 11:40    Procedures Procedures (including critical care time)  Medications Ordered in ED Medications  sodium chloride flush (NS) 0.9 % injection 3 mL (3 mLs Intravenous Given 06/22/19 1239)  morphine 4 MG/ML injection 4 mg (4 mg Intravenous Given 06/22/19 1233)  insulin aspart (novoLOG) injection 5 Units (5 Units Subcutaneous Given 06/22/19 1345)     Initial Impression / Assessment and Plan / ED Course  I have reviewed the triage vital signs and the nursing notes.  Pertinent labs & imaging results that were available during my care of the patient were reviewed by me and considered in my medical decision making (see chart for details).       BP (!) 149/109   Pulse 74   Temp 98.7 F (37.1 C) (Oral)   Resp 19   SpO2 97%    Final Clinical Impressions(s) / ED Diagnoses   Final diagnoses:  Atypical chest pain    ED Discharge Orders         Ordered    benzonatate (TESSALON) 100 MG capsule  Every 8 hours     06/22/19 1424         11:53 AM Patient presents with chest pain that is atypical of ACS.  Does not complain of any fever or other URI symptoms aside from the cough which appears to be chronic for the past several months.  Suspect musculoskeletal pain as it is reproducible on exam likely due to recent heavy lifting.  Work-up initiated.  2:17 PM Patient was hyperglycemic with initial CBG of 343, normal anion gap, after receiving insulin CBG improved to 250.  Negative delta troponin.  Chest x-ray unremarkable. Recommend pt to f/u closely with PCP for further care.  encouerage pt to discuss about his high blood sugar that needs better attention.  Return precaution given. Low suspicion for  COVID-19  JALENE DIBLASI was evaluated in Emergency  Department on 06/22/2019 for the symptoms described in the history of present illness. He was evaluated in the context of the global COVID-19 pandemic, which necessitated consideration that the patient might be at risk for infection with the SARS-CoV-2 virus that causes COVID-19. Institutional protocols and algorithms that pertain to the evaluation of patients at risk for COVID-19 are in a state of rapid change based on information released by regulatory bodies including the CDC and federal and state organizations. These policies and algorithms were followed during the patient's care in the ED.    Domenic Moras, PA-C 06/22/19 Isola, Ankit, MD 06/23/19 1437    Varney Biles, MD 06/23/19 1439

## 2019-07-13 DIAGNOSIS — Z23 Encounter for immunization: Secondary | ICD-10-CM | POA: Diagnosis not present

## 2020-01-17 ENCOUNTER — Ambulatory Visit: Payer: Medicare Other | Attending: Internal Medicine

## 2020-01-17 DIAGNOSIS — Z23 Encounter for immunization: Secondary | ICD-10-CM

## 2020-01-17 NOTE — Progress Notes (Signed)
   Covid-19 Vaccination Clinic  Name:  Albert Mcdonald    MRN: [id number] DOB: 10-01-1970  01/17/2020  [name] was observed post Covid-19 immunization for 15 minutes without incident. He was provided with Vaccine Information Sheet and instruction to access the V-Safe system.   [name] was instructed to call 911 with any severe reactions post vaccine: Marland Kitchen Difficulty breathing  . Swelling of face and throat  . A fast heartbeat  . A bad rash all over body  . Dizziness and weakness   Immunizations Administered    Name Date Dose VIS Date Route   Moderna COVID-19 Vaccine 01/17/2020  8:43 AM 0.5 mL 08/2019 Intramuscular   Manufacturer: Moderna   Lot: [id number]   [id number]

## 2020-02-19 ENCOUNTER — Ambulatory Visit: Payer: Medicare Other | Attending: Internal Medicine

## 2020-02-19 ENCOUNTER — Ambulatory Visit: Payer: Medicare Other

## 2020-06-01 ENCOUNTER — Other Ambulatory Visit: Payer: Self-pay

## 2020-06-01 ENCOUNTER — Emergency Department (HOSPITAL_COMMUNITY): Payer: Medicare Other

## 2020-06-01 ENCOUNTER — Emergency Department (HOSPITAL_COMMUNITY)
Admission: EM | Admit: 2020-06-01 | Discharge: 2020-06-01 | Disposition: A | Discharge status: Home / Self Care | Payer: Medicare Other | Attending: Emergency Medicine | Admitting: Emergency Medicine

## 2020-06-01 ENCOUNTER — Encounter (HOSPITAL_COMMUNITY): Payer: Self-pay

## 2020-06-01 DIAGNOSIS — L03115 Cellulitis of right lower limb: Secondary | ICD-10-CM | POA: Insufficient documentation

## 2020-06-01 DIAGNOSIS — M79674 Pain in right toe(s): Secondary | ICD-10-CM | POA: Diagnosis present

## 2020-06-01 DIAGNOSIS — Z7984 Long term (current) use of oral hypoglycemic drugs: Secondary | ICD-10-CM | POA: Insufficient documentation

## 2020-06-01 DIAGNOSIS — M79671 Pain in right foot: Secondary | ICD-10-CM | POA: Diagnosis not present

## 2020-06-01 DIAGNOSIS — Z79899 Other long term (current) drug therapy: Secondary | ICD-10-CM | POA: Diagnosis not present

## 2020-06-01 DIAGNOSIS — L02611 Cutaneous abscess of right foot: Secondary | ICD-10-CM | POA: Diagnosis not present

## 2020-06-01 DIAGNOSIS — L539 Erythematous condition, unspecified: Secondary | ICD-10-CM | POA: Insufficient documentation

## 2020-06-01 DIAGNOSIS — E1140 Type 2 diabetes mellitus with diabetic neuropathy, unspecified: Secondary | ICD-10-CM | POA: Diagnosis not present

## 2020-06-01 DIAGNOSIS — I10 Essential (primary) hypertension: Secondary | ICD-10-CM | POA: Diagnosis not present

## 2020-06-01 DIAGNOSIS — M7989 Other specified soft tissue disorders: Secondary | ICD-10-CM | POA: Diagnosis not present

## 2020-06-01 MED ORDER — LIDOCAINE HCL (PF) 2 % IJ SOLN
INTRAMUSCULAR | Status: AC
  Administered 2020-06-01: 5 mL via INTRADERMAL
  Filled 2020-06-01: qty 10

## 2020-06-01 MED ORDER — PIPERACILLIN-TAZOBACTAM 3.375 G IVPB 30 MIN
3.3750 g | Freq: Once | INTRAVENOUS | Status: AC
  Administered 2020-06-01: 3.375 g via INTRAVENOUS
  Filled 2020-06-01: qty 50

## 2020-06-01 MED ORDER — DOXYCYCLINE HYCLATE 100 MG PO CAPS: 100.0000 mg | ORAL_CAPSULE | Freq: Two times a day (BID) | ORAL | 0 refills | Status: DC

## 2020-06-01 MED ORDER — LIDOCAINE HCL (PF) 2 % IJ SOLN: 5.0000 mL | Freq: Once | INTRAMUSCULAR | Status: AC

## 2020-06-01 NOTE — ED Provider Notes (Signed)
[facility] EMERGENCY DEPARTMENT Provider Note   CSN: [id number] Arrival date & time: 06/01/20  1132     History Chief Complaint  Patient presents with  . Toe Pain    Albert Mcdonald is a 49 y.o. male.  HPI      Albert Mcdonald is a 49 y.o. male with past medical history of Kleine-Levin syndrome, type 2 diabetes, hypertension who presents to the Emergency Department complaining of redness and increasing pain and swelling of his right fifth toe.  He states that he cut his toenails 4 days ago and woke up the following day noticing redness and pain of his fifth toe.  Yesterday, he noticed a red streak from his toe radiating up his right lower leg.  He describes aching pain along the entire right leg into his groin.  Pain is worse with weight bearing.  He has been cleaning the area with peroxide.  He denies fever, chills, nausea, vomiting, and numbness of his foot or toes.   Past Medical History:  Diagnosis Date  . ALS (amyotrophic lateral sclerosis) ([place])    patient denied on 11/01/12 -- has KLS not ALS  . Diabetes mellitus   . Family history of anesthesia complication   . Hyperlipidemia   . Hypertension   . Kleine-Levin syndrome   . OCD (obsessive compulsive disorder)   . Restless leg     Patient Active Problem List   Diagnosis Date Noted  . Depression 08/09/2017  . Diabetic neuropathy ([place]) 07/08/2017  . Acute encephalopathy 03/15/2017  . Drug overdose 03/14/2017  . Acute metabolic encephalopathy 02/40/9735  . Polysubstance abuse ([place]) 03/14/2017  . Cocaine abuse ([place])   . Type 2 diabetes mellitus ([place])   . Hyperlipidemia   . Hypertension   . Restless leg   . Family history of anesthesia complication   . Cervical spondylosis with myelopathy 09/29/2012  . OCD (obsessive compulsive disorder) 03/16/2011  . 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 HLD (hyperlipidemia) 03/16/2011    Past Surgical History:  Procedure Laterality Date  . ANTERIOR CERVICAL  DECOMP/DISCECTOMY FUSION  11/09/2012   Procedure: ANTERIOR CERVICAL DECOMPRESSION/DISCECTOMY FUSION 3 LEVELS C4-C7 ;  Surgeon: Melina Schools, MD;  Location: [place];  Service: Orthopedics;;  ACDF C4-C7        History reviewed. No pertinent family history.  Social History   Tobacco Use  . Smoking status: Never Smoker  . Smokeless tobacco: Never Used  Vaping Use  . Vaping Use: Never used  Substance Use Topics  . Alcohol use: Yes  . Drug use: No    Home Medications Prior to Admission medications   Medication Sig Start Date End Date Taking? Authorizing Provider  ACCU-CHEK SOFTCLIX LANCETS lancets Use as instructed once daily. E11.9 07/11/17   Tresa Garter, MD  amLODipine (NORVASC) 10 MG tablet Take 1 tablet (10 mg total) by mouth daily. 06/01/17   Brayton Caves, PA-C  amoxicillin-clavulanate (AUGMENTIN) 875-125 MG tablet Take 1 tablet by mouth every 12 (twelve) hours. Patient not taking: Reported on 08/09/2017 07/09/17   Jeannett Senior, PA-C  benzonatate (TESSALON) 100 MG capsule Take 1 capsule (100 mg total) by mouth every 8 (eight) hours. 06/22/19   Domenic Moras, PA-C  Blood Glucose Monitoring Suppl (ACCU-CHEK AVIVA) device Use as instructed daily. E11.9 07/11/17   Tresa Garter, MD  FLUoxetine (PROZAC) 20 MG tablet Take 1 tablet (20 mg total) daily by mouth. 08/09/17   Charlott Rakes, MD  folic acid (FOLVITE) 1 MG tablet Take  1 tablet (1 mg total) by mouth daily. Patient not taking: Reported on 06/01/2017 03/17/17   Damita Lack, MD  gabapentin (NEURONTIN) 300 MG capsule Take 1 capsule (300 mg total) by mouth 2 (two) times daily. 06/01/17   Brayton Caves, PA-C  glucose blood (ACCU-CHEK AVIVA) test strip Use as instructed daily. E11.9 07/11/17   Tresa Garter, MD  Lancet Devices [facility]) lancets Use as instructed daily.E11.9 07/11/17   Charlott Rakes, MD  meloxicam (MOBIC) 7.5 MG tablet Take 1 tablet (7.5 mg total) by mouth daily. 07/08/17    Charlott Rakes, MD  metFORMIN (GLUCOPHAGE) 500 MG tablet Take 1 tablet (500 mg total) by mouth 2 (two) times daily with a meal. 06/01/17   Brayton Caves, PA-C  Multiple Vitamin (MULTIVITAMIN WITH MINERALS) TABS tablet Take 1 tablet by mouth daily. Patient not taking: Reported on 06/01/2017 03/17/17   Damita Lack, MD  thiamine 100 MG tablet Take 1 tablet (100 mg total) by mouth daily. Patient not taking: Reported on 06/01/2017 03/17/17   Damita Lack, MD  triamcinolone cream (KENALOG) 0.1 % Apply 1 application 2 (two) times daily topically. 08/09/17   Charlott Rakes, MD    Allergies    Ace inhibitors  Review of Systems   Review of Systems  Constitutional: Negative for chills and fever.  Respiratory: Negative for shortness of breath.   Cardiovascular: Negative for chest pain.  Gastrointestinal: Negative for abdominal pain, nausea and vomiting.  Musculoskeletal: Positive for arthralgias (right fifth toe pain and swelling). Negative for joint swelling.  Skin: Negative for color change and wound.    Physical Exam Updated Vital Signs BP (!) 157/105 (BP Location: Right Arm)   Pulse 99   Temp 98.4 F (36.9 C) (Oral)   Resp 18   Ht 6\' 1"  (1.854 m)   Wt 102.1 kg   SpO2 97%   BMI 29.69 kg/m   Physical Exam Vitals and nursing note reviewed.  Constitutional:      General: He is not in acute distress.    Appearance: He is well-developed.  HENT:     Head: Normocephalic and atraumatic.  Cardiovascular:     Rate and Rhythm: Normal rate and regular rhythm.     Heart sounds: Normal heart sounds. No murmur heard.   Pulmonary:     Effort: Pulmonary effort is normal. No respiratory distress.     Breath sounds: Normal breath sounds.  Musculoskeletal:        General: No tenderness.     Cervical back: Normal range of motion and neck supple.  Lymphadenopathy:     Cervical: No cervical adenopathy.  Skin:    General: Skin is warm.     Findings: Erythema present. No rash.      Comments: Erythema with edema and excessive warmth of the right fifth toe.  Focal fluctuance adjacent to a small open wound to the lateral toe.  Mild lymphangitis.  See attached photo  Neurological:     Mental Status: He is alert and oriented to person, place, and time.     Motor: No abnormal muscle tone.     Coordination: Coordination normal.       ED Results / Procedures / Treatments   Labs (all labs ordered are listed, but only abnormal results are displayed) Labs Reviewed - No data to display  EKG None  Radiology DG Foot Complete Right  Result Date: 06/01/2020 CLINICAL DATA:  Pain and swelling involving fifth digit after cutting toenails  3 days ago. Evaluate for infection/osteomyelitis. EXAM: RIGHT FOOT COMPLETE - 3+ VIEW COMPARISON:  None FINDINGS: No fracture or dislocation. No discrete areas of osteolysis with special attention paid to the fifth digit. No significant hallux valgus deformity. Joint spaces are preserved. No erosions. Enthesopathic change involving the Achilles tendon insertion site. IMPRESSION: No radiographic evidence of osteomyelitis with special attention paid to the fifth digit. Further evaluation with dedicated toe radiographs could be performed as indicated. Electronically Signed   By: Sandi Mariscal M.D.   On: 06/01/2020 13:18    Procedures Procedures (including critical care time)   INCISION AND DRAINAGE Performed by: Jalyn Dutta Consent: Verbal consent obtained. Risks and benefits: risks, benefits and alternatives were discussed Type: abscess  Body area: right fifth toe  Anesthesia: digital block Incision was made with a  #15 scalpel.  Local anesthetic: lidocaine 2% w/o epinephrine  Anesthetic total: 4 ml  Complexity: complex Blunt dissection to break up loculations  Drainage: purulent  Drainage amount: moderate  Packing material: none  Patient tolerance: Patient tolerated the procedure well with no immediate  complications.    Medications Ordered in ED Medications - No data to display  ED Course  I have reviewed the triage vital signs and the nursing notes.  Pertinent labs & imaging results that were available during my care of the patient were reviewed by me and considered in my medical decision making (see chart for details).    MDM Rules/Calculators/A&P                          Patient here with a likely developing abscess with cellulitis.  He is nontoxic-appearing.  Ambulatory with minimal discomfort.  Neurovascularly intact.  I will obtain x-ray to rule out osteomyelitis.  Patient also seen by Dr. Roderic Palau and care plan discussed.  Will also I&D area and start antibiotics.  Successful I&D of the toe. IV Zosyn given here and pt will be started on oral antibiotics. He is non-toxic appearing and appropriate for d/c home.  He agrees to close PCP f/u in 2-3 days.  Also advised to return here for recheck if unable to be seen by PCP.     Final Clinical Impression(s) / ED Diagnoses Final diagnoses:  Cellulitis and abscess of toe of right foot    Rx / DC Orders ED Discharge Orders    None       Kem Parkinson, PA-C 06/01/20 1557    Milton Ferguson, MD 06/03/20 [phone]

## 2020-06-01 NOTE — ED Triage Notes (Signed)
Pt to er, pt states that he is here for redness and swelling on his R pinky toe, pt states that he started to have some swelling after cutting his toenails on Thursday.  States that he has been putting peroxide on it, but it is more swollen and painful.

## 2020-06-01 NOTE — Discharge Instructions (Addendum)
Keep your right fifth toe clean with mild soap and water and keep it bandaged.  Soak your foot in warm water 2-3 times a day.  Follow-up with your primary care provider for recheck in 2 to 3 days.  Return here for recheck if your developing worsening symptoms and unable to be seen by your primary care provider.

## 2020-08-20 DIAGNOSIS — M79674 Pain in right toe(s): Secondary | ICD-10-CM | POA: Diagnosis not present

## 2020-08-20 DIAGNOSIS — Z5321 Procedure and treatment not carried out due to patient leaving prior to being seen by health care provider: Secondary | ICD-10-CM | POA: Diagnosis not present

## 2020-08-20 DIAGNOSIS — M7989 Other specified soft tissue disorders: Secondary | ICD-10-CM | POA: Diagnosis not present

## 2020-08-20 DIAGNOSIS — E119 Type 2 diabetes mellitus without complications: Secondary | ICD-10-CM | POA: Diagnosis not present

## 2020-08-20 DIAGNOSIS — L03031 Cellulitis of right toe: Secondary | ICD-10-CM | POA: Diagnosis not present

## 2020-08-20 DIAGNOSIS — L02611 Cutaneous abscess of right foot: Secondary | ICD-10-CM | POA: Diagnosis not present

## 2020-08-28 DIAGNOSIS — L02611 Cutaneous abscess of right foot: Secondary | ICD-10-CM | POA: Diagnosis not present

## 2020-10-17 ENCOUNTER — Other Ambulatory Visit: Payer: Self-pay

## 2020-10-17 ENCOUNTER — Ambulatory Visit
Admission: EM | Admit: 2020-10-17 | Discharge: 2020-10-17 | Disposition: A | Discharge status: Home / Self Care | Payer: Medicare Other | Attending: Family Medicine | Admitting: Family Medicine

## 2020-10-17 ENCOUNTER — Encounter: Payer: Self-pay | Admitting: Emergency Medicine

## 2020-10-17 ENCOUNTER — Ambulatory Visit (INDEPENDENT_AMBULATORY_CARE_PROVIDER_SITE_OTHER): Payer: Medicare Other

## 2020-10-17 ENCOUNTER — Encounter (HOSPITAL_COMMUNITY): Payer: Self-pay | Admitting: Emergency Medicine

## 2020-10-17 DIAGNOSIS — R079 Chest pain, unspecified: Secondary | ICD-10-CM | POA: Diagnosis present

## 2020-10-17 DIAGNOSIS — R000 Tachycardia, unspecified: Secondary | ICD-10-CM

## 2020-10-17 DIAGNOSIS — I10 Essential (primary) hypertension: Secondary | ICD-10-CM | POA: Diagnosis not present

## 2020-10-17 DIAGNOSIS — Z5321 Procedure and treatment not carried out due to patient leaving prior to being seen by health care provider: Secondary | ICD-10-CM | POA: Diagnosis not present

## 2020-10-17 LAB — POCT FASTING CBG KUC MANUAL ENTRY: POCT Glucose (KUC): 313 mg/dL — AB (ref 70–99)

## 2020-10-17 LAB — CBC
HCT: 46.5 % (ref 39.0–52.0)
Hemoglobin: 16.2 g/dL (ref 13.0–17.0)
MCH: 29.2 pg (ref 26.0–34.0)
MCHC: 34.8 g/dL (ref 30.0–36.0)
MCV: 83.9 fL (ref 80.0–100.0)
Platelets: 172 10*3/uL (ref 150–400)
RBC: 5.54 MIL/uL (ref 4.22–5.81)
RDW: 12.1 % (ref 11.5–15.5)
WBC: 13.6 10*3/uL — ABNORMAL HIGH (ref 4.0–10.5)
nRBC: 0 % (ref 0.0–0.2)

## 2020-10-17 LAB — BASIC METABOLIC PANEL
Anion gap: 17 — ABNORMAL HIGH (ref 5–15)
BUN: 12 mg/dL (ref 6–20)
CO2: 24 mmol/L (ref 22–32)
Calcium: 9.3 mg/dL (ref 8.9–10.3)
Chloride: 95 mmol/L — ABNORMAL LOW (ref 98–111)
Creatinine, Ser: 0.77 mg/dL (ref 0.61–1.24)
GFR, Estimated: >60 mL/min (ref >60)
Glucose, Bld: 316 mg/dL — ABNORMAL HIGH (ref 70–99)
Potassium: 3.9 mmol/L (ref 3.5–5.1)
Sodium: 136 mmol/L (ref 135–145)

## 2020-10-17 LAB — TROPONIN I (HIGH SENSITIVITY)
Troponin I (High Sensitivity): 6 ng/L (ref <18)
Troponin I (High Sensitivity): 6 ng/L (ref <18)

## 2020-10-17 NOTE — Discharge Instructions (Signed)
To ER via EMS 

## 2020-10-17 NOTE — ED Notes (Signed)
Patient is being discharged from the Urgent Care and sent to the Emergency Department via ambulance . Per Albert Mcdonald  patient is in need of higher level of care due to r/o PE. Patient is aware and verbalizes understanding of plan of care.  Vitals:   10/17/20 1710  BP: (!) 165/105  Pulse: (!) 105  Resp: 20  Temp: 99.8 F (37.7 C)  SpO2: 96%

## 2020-10-17 NOTE — ED Provider Notes (Signed)
[facility]    CSN: [id number] Arrival date & time: 10/17/20  1642      History   Chief Complaint Chief Complaint  Patient presents with   Chest Pain    Right sided    HPI Albert Mcdonald is a 50 y.o. male.   Reports right sided sharp chest pain since yesterday. States that he took advil and it helped for about an hour. Reports that the pain is worse with movement but the chest is non tender. Denies smoking, alcohol use, drug use. Denies similar symptoms in the past. Denies strong family hx cardiac history. Has medical history significant for hypertension, Kleine-Levin syndrome, diabetes, OCD. Reports that this pain is making him very anxious. Denies fever, cough, headache, SOB, nausea, vomiting, diarrhea, rash, other symptoms.   ROS per HPI  The history is provided by the patient.  Chest Pain Pain location:  R chest Pain quality: sharp, shooting and stabbing   Pain radiates to:  Does not radiate Pain severity:  Severe Onset quality:  Sudden Duration:  1 day Timing:  Intermittent Progression:  Worsening Chronicity:  New Relieved by:  Nothing Worsened by:  Exertion and movement Ineffective treatments:  None tried Risk factors: diabetes mellitus, hypertension and male sex     Past Medical History:  Diagnosis Date   ALS (amyotrophic lateral sclerosis) ([place])    patient denied on 11/01/12 -- has KLS not ALS   Diabetes mellitus    Family history of anesthesia complication    Hyperlipidemia    Hypertension    Kleine-Levin syndrome    OCD (obsessive compulsive disorder)    Restless leg     Patient Active Problem List   Diagnosis Date Noted   Depression 08/09/2017   Diabetic neuropathy ([place]) 07/08/2017   Acute encephalopathy 03/15/2017   Drug overdose 38/75/6433   Acute metabolic encephalopathy 29/51/8841   Polysubstance abuse ([place]) 03/14/2017   Cocaine abuse ([place])    Type 2 diabetes mellitus ([place])    Hyperlipidemia     Hypertension    Restless leg    Family history of anesthesia complication    Cervical spondylosis with myelopathy 09/29/2012   OCD (obsessive compulsive disorder) 03/16/2011   Kleine-Levin syndrome 03/16/2011   HLD (hyperlipidemia) 03/16/2011    Past Surgical History:  Procedure Laterality Date   ANTERIOR CERVICAL DECOMP/DISCECTOMY FUSION  11/09/2012   Procedure: ANTERIOR CERVICAL DECOMPRESSION/DISCECTOMY FUSION 3 LEVELS C4-C7 ;  Surgeon: Melina Schools, MD;  Location: [place];  Service: Orthopedics;;  ACDF C4-C7        Home Medications    Prior to Admission medications   Medication Sig Start Date End Date Taking? Authorizing Provider  ACCU-CHEK SOFTCLIX LANCETS lancets Use as instructed once daily. E11.9 07/11/17   Tresa Garter, MD  amLODipine (NORVASC) 10 MG tablet Take 1 tablet (10 mg total) by mouth daily. 06/01/17   Brayton Caves, PA-C  benzonatate (TESSALON) 100 MG capsule Take 1 capsule (100 mg total) by mouth every 8 (eight) hours. 06/22/19   Domenic Moras, PA-C  Blood Glucose Monitoring Suppl (ACCU-CHEK AVIVA) device Use as instructed daily. E11.9 07/11/17   Tresa Garter, MD  doxycycline (VIBRAMYCIN) 100 MG capsule Take 1 capsule (100 mg total) by mouth 2 (two) times daily. 06/01/20   Triplett, Tammy, PA-C  FLUoxetine (PROZAC) 20 MG tablet Take 1 tablet (20 mg total) daily by mouth. 08/09/17   Charlott Rakes, MD  folic acid (FOLVITE) 1 MG tablet Take 1 tablet (1 mg total)  by mouth daily. Patient not taking: Reported on 06/01/2017 03/17/17   Damita Lack, MD  gabapentin (NEURONTIN) 300 MG capsule Take 1 capsule (300 mg total) by mouth 2 (two) times daily. 06/01/17   Brayton Caves, PA-C  glucose blood (ACCU-CHEK AVIVA) test strip Use as instructed daily. E11.9 07/11/17   Tresa Garter, MD  Lancet Devices [employer]) lancets Use as instructed daily.E11.9 07/11/17   Charlott Rakes, MD  meloxicam (MOBIC) 7.5 MG tablet Take 1 tablet (7.5 mg  total) by mouth daily. 07/08/17   Charlott Rakes, MD  metFORMIN (GLUCOPHAGE) 500 MG tablet Take 1 tablet (500 mg total) by mouth 2 (two) times daily with a meal. 06/01/17   Brayton Caves, PA-C  Multiple Vitamin (MULTIVITAMIN WITH MINERALS) TABS tablet Take 1 tablet by mouth daily. Patient not taking: Reported on 06/01/2017 03/17/17   Damita Lack, MD  thiamine 100 MG tablet Take 1 tablet (100 mg total) by mouth daily. Patient not taking: Reported on 06/01/2017 03/17/17   Damita Lack, MD  triamcinolone cream (KENALOG) 0.1 % Apply 1 application 2 (two) times daily topically. 08/09/17   Charlott Rakes, MD    Family History No family history on file.  Social History Social History   Tobacco Use   Smoking status: Never Smoker   Smokeless tobacco: Never Used  Scientific laboratory technician Use: Never used  Substance Use Topics   Alcohol use: Yes   Drug use: No     Allergies   Ace inhibitors   Review of Systems Review of Systems  Cardiovascular: Positive for chest pain.     Physical Exam Triage Vital Signs ED Triage Vitals  Enc Vitals Group     BP 10/17/20 1710 (!) 165/105     Pulse Rate 10/17/20 1710 (!) 105     Resp 10/17/20 1710 20     Temp 10/17/20 1710 99.8 F (37.7 C)     Temp Source 10/17/20 1710 Oral     SpO2 10/17/20 1710 96 %     Weight 10/17/20 1712 240 lb (108.9 kg)     Height --      Head Circumference --      Peak Flow --      Pain Score 10/17/20 1712 9     Pain Loc --      Pain Edu? --      Excl. in [place]? --    No data found.  Updated Vital Signs BP (!) 165/105 (BP Location: Right Arm) Comment: x 2 times   Pulse (!) 105    Temp 99.8 F (37.7 C) (Oral)    Resp 20    Wt 240 lb (108.9 kg)    SpO2 96%    BMI 31.66 kg/m      Physical Exam Vitals and nursing note reviewed.  Constitutional:      Appearance: He is well-developed and well-nourished.  HENT:     Head: Normocephalic and atraumatic.  Eyes:     Extraocular Movements: Extraocular  movements intact.     Conjunctiva/sclera: Conjunctivae normal.     Pupils: Pupils are equal, round, and reactive to light.  Cardiovascular:     Rate and Rhythm: Regular rhythm. Tachycardia present.  No extrasystoles are present.    Chest Wall: PMI is not displaced.     Pulses:          Carotid pulses are 2+ on the right side and 2+ on the left side.  Radial pulses are 2+ on the right side and 2+ on the left side.       Dorsalis pedis pulses are 2+ on the right side and 2+ on the left side.     Heart sounds: Normal heart sounds. No murmur heard.  No diastolic murmur is present. No friction rub. No gallop. No S3 or S4 sounds.   Pulmonary:     Effort: Pulmonary effort is normal. Tachypnea present. No accessory muscle usage or respiratory distress.     Breath sounds: Normal breath sounds. No stridor. No decreased breath sounds, wheezing, rhonchi or rales.  Chest:     Chest wall: No mass, deformity, tenderness, crepitus or edema. There is no dullness to percussion.  Abdominal:     Palpations: Abdomen is soft.     Tenderness: There is no abdominal tenderness.  Musculoskeletal:        General: No edema. Normal range of motion.     Cervical back: Neck supple.     Right lower leg: No edema.     Left lower leg: No tenderness. No edema.  Skin:    General: Skin is warm and dry.     Capillary Refill: Capillary refill takes less than 2 seconds.  Neurological:     General: No focal deficit present.     Mental Status: He is alert and oriented to person, place, and time.     Cranial Nerves: No cranial nerve deficit.     Motor: No weakness.  Psychiatric:        Mood and Affect: Mood is anxious.        Behavior: Behavior normal. Behavior is not agitated.      UC Treatments / Results  Labs (all labs ordered are listed, but only abnormal results are displayed) Labs Reviewed  POCT FASTING CBG [place] - Abnormal; Notable for the following components:      Result Value   POCT  Glucose ([facility]) 313 (*)    All other components within normal limits    EKG   Radiology DG Chest 2 View  Result Date: 10/17/2020 CLINICAL DATA:  Chest pain EXAM: CHEST - 2 VIEW COMPARISON:  06/22/2019 FINDINGS: No focal opacity or pleural effusion. Normal cardiomediastinal silhouette. No pneumothorax. Surgical hardware in the cervical spine. IMPRESSION: No active cardiopulmonary disease. Electronically Signed   By: Donavan Foil M.D.   On: 10/17/2020 17:56    Procedures Procedures (including critical care time)  Medications Ordered in UC Medications - No data to display  Initial Impression / Assessment and Plan / UC Course  I have reviewed the triage vital signs and the nursing notes.  Pertinent labs & imaging results that were available during my care of the patient were reviewed by me and considered in my medical decision making (see chart for details).     Chest Pain Tachycardia Hypertension  EKG with sinus tach in office tonight, unremarkable difference from last EKG Appears uncomfortable on exam Chest xray negative for pneumonia today CBG in office 313 Discussed that we cannot rule out pulmonary embolism in the office Discussed with patient that he would be best served in the ER Patient agrees and would like to go to the ER via EMS EMS arrived and transporting patient to ER, stable at discharge   Final Clinical Impressions(s) / UC Diagnoses   Final diagnoses:  Right-sided chest pain  Tachycardia  Essential hypertension     Discharge Instructions     To ER via EMS  ED Prescriptions    None     PDMP not reviewed this encounter.   Faustino Congress, NP 10/17/20 [place]

## 2020-10-17 NOTE — ED Triage Notes (Signed)
Pt to the ED [facility] with complaints of RT sided chest pain that began 0530 this morning. The pain is described as stabbing pain that does not radiate.

## 2020-10-17 NOTE — ED Triage Notes (Signed)
Pt having right sided chest pain since yesterday.  Nothing eases it.  Movement makes it worse.  Took advil yesterday with relief but it came right back.  Describes the pain as stabbing.

## 2020-10-18 ENCOUNTER — Emergency Department (HOSPITAL_COMMUNITY)
Admission: EM | Admit: 2020-10-18 | Discharge: 2020-10-18 | Disposition: A | Discharge status: Home / Self Care | Payer: Medicare Other | Attending: Emergency Medicine | Admitting: Emergency Medicine

## 2020-10-18 NOTE — ED Notes (Signed)
Pt given blanket and explained all lab work was normal so far.

## 2020-10-19 DIAGNOSIS — L0390 Cellulitis, unspecified: Secondary | ICD-10-CM | POA: Insufficient documentation

## 2020-10-19 DIAGNOSIS — D72829 Elevated white blood cell count, unspecified: Secondary | ICD-10-CM | POA: Insufficient documentation

## 2020-10-19 DIAGNOSIS — R739 Hyperglycemia, unspecified: Secondary | ICD-10-CM | POA: Insufficient documentation

## 2020-10-19 DIAGNOSIS — M4643 Discitis, unspecified, cervicothoracic region: Secondary | ICD-10-CM | POA: Insufficient documentation

## 2020-10-21 DIAGNOSIS — M7989 Other specified soft tissue disorders: Secondary | ICD-10-CM | POA: Insufficient documentation

## 2020-10-21 DIAGNOSIS — R7881 Bacteremia: Secondary | ICD-10-CM | POA: Insufficient documentation

## 2020-10-21 DIAGNOSIS — B999 Unspecified infectious disease: Secondary | ICD-10-CM | POA: Insufficient documentation

## 2020-10-23 DIAGNOSIS — M869 Osteomyelitis, unspecified: Secondary | ICD-10-CM | POA: Insufficient documentation

## 2020-10-27 ENCOUNTER — Other Ambulatory Visit (HOSPITAL_COMMUNITY)
Admission: RE | Admit: 2020-10-27 | Discharge: 2020-10-27 | Disposition: A | Discharge status: Home / Self Care | Payer: Medicare Other | Source: Other Acute Inpatient Hospital | Attending: Surgery | Admitting: Surgery

## 2020-10-27 DIAGNOSIS — L0390 Cellulitis, unspecified: Secondary | ICD-10-CM | POA: Insufficient documentation

## 2020-10-27 LAB — CBC
HCT: 42.6 % (ref 39.0–52.0)
Hemoglobin: 14.4 g/dL (ref 13.0–17.0)
MCH: 29 pg (ref 26.0–34.0)
MCHC: 33.8 g/dL (ref 30.0–36.0)
MCV: 85.7 fL (ref 80.0–100.0)
Platelets: 304 10*3/uL (ref 150–400)
RBC: 4.97 MIL/uL (ref 4.22–5.81)
RDW: 11.7 % (ref 11.5–15.5)
WBC: 10 10*3/uL (ref 4.0–10.5)
nRBC: 0 % (ref 0.0–0.2)

## 2020-10-27 LAB — COMPREHENSIVE METABOLIC PANEL
ALT: 33 U/L (ref 0–44)
AST: 22 U/L (ref 15–41)
Albumin: 3.3 g/dL — ABNORMAL LOW (ref 3.5–5.0)
Alkaline Phosphatase: 107 U/L (ref 38–126)
Anion gap: 12 (ref 5–15)
BUN: 19 mg/dL (ref 6–20)
CO2: 24 mmol/L (ref 22–32)
Calcium: 9.2 mg/dL (ref 8.9–10.3)
Chloride: 101 mmol/L (ref 98–111)
Creatinine, Ser: 0.56 mg/dL — ABNORMAL LOW (ref 0.61–1.24)
GFR, Estimated: >60 mL/min (ref >60)
Glucose, Bld: 262 mg/dL — ABNORMAL HIGH (ref 70–99)
Potassium: 3.6 mmol/L (ref 3.5–5.1)
Sodium: 137 mmol/L (ref 135–145)
Total Bilirubin: 0.4 mg/dL (ref 0.3–1.2)
Total Protein: 7.2 g/dL (ref 6.5–8.1)

## 2020-10-27 LAB — SEDIMENTATION RATE: Sed Rate: 51 mm/hr — ABNORMAL HIGH (ref 0–16)

## 2020-10-27 LAB — C-REACTIVE PROTEIN: CRP: 7.7 mg/dL — ABNORMAL HIGH (ref <1.0)

## 2020-11-24 ENCOUNTER — Other Ambulatory Visit (HOSPITAL_COMMUNITY)
Admission: RE | Admit: 2020-11-24 | Discharge: 2020-11-24 | Disposition: A | Discharge status: Home / Self Care | Payer: Medicare Other | Source: Other Acute Inpatient Hospital | Attending: Surgery | Admitting: Surgery

## 2020-11-24 DIAGNOSIS — L0390 Cellulitis, unspecified: Secondary | ICD-10-CM | POA: Diagnosis present

## 2020-11-24 LAB — COMPREHENSIVE METABOLIC PANEL
ALT: 24 U/L (ref 0–44)
AST: 20 U/L (ref 15–41)
Albumin: 4.3 g/dL (ref 3.5–5.0)
Alkaline Phosphatase: 118 U/L (ref 38–126)
Anion gap: 10 (ref 5–15)
BUN: 16 mg/dL (ref 6–20)
CO2: 25 mmol/L (ref 22–32)
Calcium: 9.3 mg/dL (ref 8.9–10.3)
Chloride: 100 mmol/L (ref 98–111)
Creatinine, Ser: 0.58 mg/dL — ABNORMAL LOW (ref 0.61–1.24)
GFR, Estimated: >60 mL/min (ref >60)
Glucose, Bld: 273 mg/dL — ABNORMAL HIGH (ref 70–99)
Potassium: 4 mmol/L (ref 3.5–5.1)
Sodium: 135 mmol/L (ref 135–145)
Total Bilirubin: 0.4 mg/dL (ref 0.3–1.2)
Total Protein: 7.2 g/dL (ref 6.5–8.1)

## 2020-11-24 LAB — CBC WITH DIFFERENTIAL/PLATELET
Abs Immature Granulocytes: 0.02 10*3/uL (ref 0.00–0.07)
Basophils Absolute: 0 10*3/uL (ref 0.0–0.1)
Basophils Relative: 1 %
Eosinophils Absolute: 0.1 10*3/uL (ref 0.0–0.5)
Eosinophils Relative: 2 %
HCT: 46.5 % (ref 39.0–52.0)
Hemoglobin: 15.7 g/dL (ref 13.0–17.0)
Immature Granulocytes: 0 %
Lymphocytes Relative: 36 %
Lymphs Abs: 2.1 10*3/uL (ref 0.7–4.0)
MCH: 28.1 pg (ref 26.0–34.0)
MCHC: 33.8 g/dL (ref 30.0–36.0)
MCV: 83.2 fL (ref 80.0–100.0)
Monocytes Absolute: 0.4 10*3/uL (ref 0.1–1.0)
Monocytes Relative: 8 %
Neutro Abs: 3.1 10*3/uL (ref 1.7–7.7)
Neutrophils Relative %: 53 %
Platelets: 175 10*3/uL (ref 150–400)
RBC: 5.59 MIL/uL (ref 4.22–5.81)
RDW: 12.3 % (ref 11.5–15.5)
WBC: 5.8 10*3/uL (ref 4.0–10.5)
nRBC: 0 % (ref 0.0–0.2)

## 2020-11-24 LAB — C-REACTIVE PROTEIN: CRP: 1.3 mg/dL — ABNORMAL HIGH (ref <1.0)

## 2020-11-24 LAB — SEDIMENTATION RATE: Sed Rate: 7 mm/hr (ref 0–16)

## 2021-03-28 DIAGNOSIS — Z9114 Patient's other noncompliance with medication regimen: Secondary | ICD-10-CM | POA: Insufficient documentation

## 2021-03-28 DIAGNOSIS — Z91148 Patient's other noncompliance with medication regimen for other reason: Secondary | ICD-10-CM | POA: Insufficient documentation

## 2021-04-05 ENCOUNTER — Ambulatory Visit
Admission: EM | Admit: 2021-04-05 | Discharge: 2021-04-05 | Disposition: A | Discharge status: Home / Self Care | Payer: Medicare Other | Attending: Urgent Care | Admitting: Urgent Care

## 2021-04-05 ENCOUNTER — Ambulatory Visit (INDEPENDENT_AMBULATORY_CARE_PROVIDER_SITE_OTHER): Payer: Medicare Other

## 2021-04-05 ENCOUNTER — Other Ambulatory Visit: Payer: Self-pay

## 2021-04-05 DIAGNOSIS — Y9341 Activity, dancing: Secondary | ICD-10-CM

## 2021-04-05 DIAGNOSIS — M25572 Pain in left ankle and joints of left foot: Secondary | ICD-10-CM

## 2021-04-05 DIAGNOSIS — I10 Essential (primary) hypertension: Secondary | ICD-10-CM | POA: Diagnosis not present

## 2021-04-05 DIAGNOSIS — E119 Type 2 diabetes mellitus without complications: Secondary | ICD-10-CM

## 2021-04-05 NOTE — Discharge Instructions (Addendum)
Please just use Tylenol at a dose of 500mg -650mg  once every 6 hours as needed for your aches, pains, fevers. Do not use any nonsteroidal anti-inflammatories (NSAIDs) like ibuprofen, Motrin, naproxen, Aleve, etc. which are all available over-the-counter.     I was not able to evaluate your Achilles tendon of the left ankle well as he had a difficult time allowing for an appropriate exam.  I am concerned that you might have an Achilles tendon rupture and therefore we will have you move around with crutches and wear the splint until you can see an orthopedic specialist.

## 2021-04-05 NOTE — ED Triage Notes (Signed)
Patient presents to Urgent Care with complaints of left ankle injury that happened  last night. He states he was dancing and twisted his ankle. Pt states he has had some numbness and tingling sensation today. He states his pain increases with ambulation. Treating pain with ibuprofen.

## 2021-04-05 NOTE — ED Provider Notes (Signed)
[facility]   MRN: [id number] DOB: May 01, 1971  Subjective:   Albert Mcdonald is a 50 y.o. male presenting for suffering a left ankle injury yesterday.  Patient states that he was dancing when he felt a twist.  He has been able to walk but has worsening pain the more steps he takes.  Denies bruising, has had some swelling.  He has a history of hypertension and type 2 diabetes not on insulin.  Denies headache, confusion, vision changes, chest pain, abdominal pain.  No current facility-administered medications for this encounter.  Current Outpatient Medications:    ACCU-CHEK SOFTCLIX LANCETS lancets, Use as instructed once daily. E11.9, Disp: 100 each, Rfl: 12   amLODipine (NORVASC) 10 MG tablet, Take 1 tablet (10 mg total) by mouth daily., Disp: 90 tablet, Rfl: 1   benzonatate (TESSALON) 100 MG capsule, Take 1 capsule (100 mg total) by mouth every 8 (eight) hours., Disp: 21 capsule, Rfl: 0   Blood Glucose Monitoring Suppl (ACCU-CHEK AVIVA) device, Use as instructed daily. E11.9, Disp: 1 each, Rfl: 0   doxycycline (VIBRAMYCIN) 100 MG capsule, Take 1 capsule (100 mg total) by mouth 2 (two) times daily., Disp: 20 capsule, Rfl: 0   FLUoxetine (PROZAC) 20 MG tablet, Take 1 tablet (20 mg total) daily by mouth., Disp: 30 tablet, Rfl: 3   folic acid (FOLVITE) 1 MG tablet, Take 1 tablet (1 mg total) by mouth daily. (Patient not taking: Reported on 06/01/2017), Disp: 30 tablet, Rfl: 1   gabapentin (NEURONTIN) 300 MG capsule, Take 1 capsule (300 mg total) by mouth 2 (two) times daily., Disp: 180 capsule, Rfl: 1   glucose blood (ACCU-CHEK AVIVA) test strip, Use as instructed daily. E11.9, Disp: 100 each, Rfl: 12   Lancet Devices (ACCU-CHEK SOFTCLIX) lancets, Use as instructed daily.E11.9, Disp: 1 each, Rfl: 5   meloxicam (MOBIC) 7.5 MG tablet, Take 1 tablet (7.5 mg total) by mouth daily., Disp: 30 tablet, Rfl: 2   metFORMIN (GLUCOPHAGE) 500 MG tablet, Take 1 tablet (500 mg total) by mouth  2 (two) times daily with a meal., Disp: 180 tablet, Rfl: 1   Multiple Vitamin (MULTIVITAMIN WITH MINERALS) TABS tablet, Take 1 tablet by mouth daily. (Patient not taking: Reported on 06/01/2017), Disp: 30 tablet, Rfl: 1   thiamine 100 MG tablet, Take 1 tablet (100 mg total) by mouth daily. (Patient not taking: Reported on 06/01/2017), Disp: 30 tablet, Rfl: 1   triamcinolone cream (KENALOG) 0.1 %, Apply 1 application 2 (two) times daily topically., Disp: 45 g, Rfl: 1   Allergies  Allergen Reactions   Ace Inhibitors Rash    Past Medical History:  Diagnosis Date   ALS (amyotrophic lateral sclerosis) ([place])    patient denied on 11/01/12 -- has KLS not ALS   Diabetes mellitus    Family history of anesthesia complication    Hyperlipidemia    Hypertension    Kleine-Levin syndrome    OCD (obsessive compulsive disorder)    Restless leg      Past Surgical History:  Procedure Laterality Date   ANTERIOR CERVICAL DECOMP/DISCECTOMY FUSION  11/09/2012   Procedure: ANTERIOR CERVICAL DECOMPRESSION/DISCECTOMY FUSION 3 LEVELS C4-C7 ;  Surgeon: Melina Schools, MD;  Location: [facility];  Service: Orthopedics;;  ACDF C4-C7     History reviewed. No pertinent family history.  Social History   Tobacco Use   Smoking status: Never   Smokeless tobacco: Never  Vaping Use   Vaping Use: Never used  Substance Use Topics   Alcohol use:  Yes   Drug use: No    ROS   Objective:   Vitals: BP (S) (!) 189/96 (BP Location: Right Arm)   Pulse (!) 102   Temp 98.7 F (37.1 C) (Oral)   Resp 16   SpO2 98%   BP recheck was 153/99 on recheck.   Physical Exam Constitutional:      General: He is not in acute distress.    Appearance: Normal appearance. He is well-developed and normal weight. He is not ill-appearing, toxic-appearing or diaphoretic.  HENT:     Head: Normocephalic and atraumatic.     Right Ear: External ear normal.     Left Ear: External ear normal.     Nose: Nose normal.     Mouth/Throat:      Mouth: Mucous membranes are moist.     Pharynx: Oropharynx is clear.  Eyes:     General: No scleral icterus.       Right eye: No discharge.        Left eye: No discharge.     Extraocular Movements: Extraocular movements intact.     Pupils: Pupils are equal, round, and reactive to light.  Cardiovascular:     Rate and Rhythm: Normal rate and regular rhythm.     Heart sounds: Normal heart sounds. No murmur heard.   No friction rub. No gallop.  Pulmonary:     Effort: Pulmonary effort is normal. No respiratory distress.     Breath sounds: Normal breath sounds. No stridor. No wheezing, rhonchi or rales.  Musculoskeletal:     Cervical back: Normal range of motion.     Left ankle: Swelling present. No deformity, ecchymosis or lacerations. Tenderness present over the lateral malleolus and medial malleolus. No ATF ligament, AITF ligament, CF ligament, posterior TF ligament, base of 5th metatarsal or proximal fibula tenderness. Normal range of motion (Full active range of motion).     Left Achilles Tendon: Tenderness and defect (inferiorly with associated tenderness) present. Thompson's test: Unable to evaluate as patient did not cooperate with exam.  Neurological:     Mental Status: He is alert and oriented to person, place, and time.     Cranial Nerves: No cranial nerve deficit.     Motor: No weakness.     Coordination: Coordination normal.     Gait: Gait normal.     Comments: Negative Romberg and pronator drift.  Psychiatric:        Mood and Affect: Mood normal.        Behavior: Behavior normal.        Thought Content: Thought content normal.        Judgment: Judgment normal.    DG Ankle Complete Left  Result Date: 04/05/2021 CLINICAL DATA:  Left ankle pain, twisted yesterday while dancing EXAM: LEFT ANKLE COMPLETE - 3+ VIEW COMPARISON:  None. FINDINGS: There is no evidence of fracture, dislocation, or joint effusion. There are corticated dystrophic calcifications projecting over the  expected vicinity of the Achilles tendon in the posterior heel seen on lateral view. Soft tissue edema about the posterior ankle. IMPRESSION: 1.  No fracture or dislocation of the left ankle. 2. There are corticated dystrophic calcifications projecting over the expected vicinity of the Achilles tendon in the posterior heel, most likely chronic sequelae of a prior injury, although possibly retracted enthesophytes within the Achilles tendon if Achilles tendon rupture is clinically suspected. 3.  Soft tissue edema about the posterior ankle. Electronically Signed   By: Dorna Bloom.D.  On: 04/05/2021 15:05     Assessment and Plan :   PDMP not reviewed this encounter.  1. Acute left ankle pain   2. Essential hypertension   3. Type 2 diabetes mellitus treated without insulin ([place])     I am concerned that patient may have an Achilles tendon injury/rupture.  Patient has a difficult time cooperating with examination.  Unable to fully evaluate for this.  However, given the x-ray findings we will have patient ambulate with crutches and placed in a posterior lower leg splint with slight plantarflexion.  There is no sign of an acute stroke.  Blood pressure recheck was reassuring.  Emphasized need for follow-up with his PCP as soon as possible. Counseled patient on potential for adverse effects with medications prescribed/recommended today, ER and return-to-clinic precautions discussed, patient verbalized understanding.    Jaynee Eagles, PA-C 04/05/21 1520

## 2021-04-08 ENCOUNTER — Encounter: Payer: Self-pay | Admitting: Orthopedic Surgery

## 2021-04-08 ENCOUNTER — Other Ambulatory Visit: Payer: Self-pay

## 2021-04-08 ENCOUNTER — Ambulatory Visit (INDEPENDENT_AMBULATORY_CARE_PROVIDER_SITE_OTHER): Payer: Medicare Other | Admitting: Orthopedic Surgery

## 2021-04-08 VITALS — BP 149/110 | HR 100 | Ht 73.0 in | Wt 219.2 lb

## 2021-04-08 DIAGNOSIS — S86012A Strain of left Achilles tendon, initial encounter: Secondary | ICD-10-CM

## 2021-04-08 NOTE — Progress Notes (Addendum)
NEW PROBLEM//OFFICE VISIT  Summary assessment and plan:   50 year old male diabetic recent right toe infection presents with Achilles tendon rupture left ankle  Encounter Diagnosis  Name Primary?   Rupture of left Achilles tendon, initial encounter Yes     Recommend cast  Change cast in 3 weeks  Chief Complaint  Patient presents with   Neck Pain   Ankle Pain    Left ankle pain/pain started Sat night 04/04/21 when he was dancing/ felt he twisted it   50 year old male was dancing on July 9th felt a pop in his left ankle.  He tried to walk around with it eventually went to urgent care I reviewed those records.  He has pain in the Achilles tendon area with a palpable defect and a positive Thompson test for rupture.  He had a recent infection in his right foot which required IV antibiotics.  He is a diabetic he does not smoke he does have hypertension    MEDICAL DECISION MAKING  A.  Encounter Diagnosis  Name Primary?   Rupture of left Achilles tendon, initial encounter Yes    B. DATA ANALYSED:   IMAGING: Interpretation of images: Images from the urgent care were observed.  My interpretation of these images is that he has an avulsion of the some of the calcification along with the tendon this is not the dangerous flap horizontal fracture of the calcaneus that can be seen with these patients  Orders: No new orders  Outside records reviewed: Yes   C. MANAGEMENT   Cast  No orders of the defined types were placed in this encounter.    BP (!) 149/110   Pulse 100   Ht 6\' 1"  (1.854 m)   Wt 219 lb 3.2 oz (99.4 kg)   BMI 28.92 kg/m    General appearance: Well-developed well-nourished no gross deformities  Cardiovascular normal pulse and perfusion normal color without edema  Neurologically no sensation loss or deficits or pathologic reflexes  Psychological: Awake alert and oriented x3 mood and affect normal  Skin no lacerations or ulcerations no nodularity no  palpable masses, no erythema or nodularity  Musculoskeletal: Exam shows positive Thompson test on the left ankle for rupture he has bruising and swelling with palpable defect and tenderness in the area of the Achilles watershed region   ROS no chest pain shortness of breath fever   Past Medical History:  Diagnosis Date   ALS (amyotrophic lateral sclerosis) ([place])    patient denied on 11/01/12 -- has KLS not ALS   Diabetes mellitus    Family history of anesthesia complication    Hyperlipidemia    Hypertension    Kleine-Levin syndrome    OCD (obsessive compulsive disorder)    Restless leg     Past Surgical History:  Procedure Laterality Date   ANTERIOR CERVICAL DECOMP/DISCECTOMY FUSION  11/09/2012   Procedure: ANTERIOR CERVICAL DECOMPRESSION/DISCECTOMY FUSION 3 LEVELS C4-C7 ;  Surgeon: Melina Schools, MD;  Location: [facility];  Service: Orthopedics;;  ACDF C4-C7     No family history on file. Social History   Tobacco Use   Smoking status: Never   Smokeless tobacco: Never  Vaping Use   Vaping Use: Never used  Substance Use Topics   Alcohol use: Yes   Drug use: No    Allergies  Allergen Reactions   Ace Inhibitors Rash    Current Meds  Medication Sig   ACCU-CHEK SOFTCLIX LANCETS lancets Use as instructed once daily. E11.9   amLODipine (NORVASC) 10  MG tablet Take 1 tablet (10 mg total) by mouth daily.   benzonatate (TESSALON) 100 MG capsule Take 1 capsule (100 mg total) by mouth every 8 (eight) hours.   Blood Glucose Monitoring Suppl (ACCU-CHEK AVIVA) device Use as instructed daily. E11.9   doxycycline (VIBRAMYCIN) 100 MG capsule Take 1 capsule (100 mg total) by mouth 2 (two) times daily.   FLUoxetine (PROZAC) 20 MG tablet Take 1 tablet (20 mg total) daily by mouth.   folic acid (FOLVITE) 1 MG tablet Take 1 tablet (1 mg total) by mouth daily.   gabapentin (NEURONTIN) 300 MG capsule Take 1 capsule (300 mg total) by mouth 2 (two) times daily.   glucose blood (ACCU-CHEK AVIVA)  test strip Use as instructed daily. E11.9   Lancet Devices (ACCU-CHEK SOFTCLIX) lancets Use as instructed daily.E11.9   meloxicam (MOBIC) 7.5 MG tablet Take 1 tablet (7.5 mg total) by mouth daily.   metFORMIN (GLUCOPHAGE) 500 MG tablet Take 1 tablet (500 mg total) by mouth 2 (two) times daily with a meal.   Multiple Vitamin (MULTIVITAMIN WITH MINERALS) TABS tablet Take 1 tablet by mouth daily.   thiamine 100 MG tablet Take 1 tablet (100 mg total) by mouth daily.   triamcinolone cream (KENALOG) 0.1 % Apply 1 application 2 (two) times daily topically.        Arther Abbott, MD  04/08/2021 9:14 AM

## 2021-04-13 DIAGNOSIS — L720 Epidermal cyst: Secondary | ICD-10-CM | POA: Insufficient documentation

## 2021-04-13 DIAGNOSIS — D234 Other benign neoplasm of skin of scalp and neck: Secondary | ICD-10-CM | POA: Insufficient documentation

## 2021-04-29 ENCOUNTER — Other Ambulatory Visit: Payer: Self-pay

## 2021-04-29 ENCOUNTER — Encounter: Payer: Self-pay | Admitting: Orthopedic Surgery

## 2021-04-29 ENCOUNTER — Ambulatory Visit (INDEPENDENT_AMBULATORY_CARE_PROVIDER_SITE_OTHER): Payer: Medicare Other | Admitting: Orthopedic Surgery

## 2021-04-29 VITALS — BP 146/103 | HR 91

## 2021-04-29 DIAGNOSIS — S86012D Strain of left Achilles tendon, subsequent encounter: Secondary | ICD-10-CM | POA: Diagnosis not present

## 2021-04-29 DIAGNOSIS — S86012A Strain of left Achilles tendon, initial encounter: Secondary | ICD-10-CM | POA: Insufficient documentation

## 2021-04-29 NOTE — Progress Notes (Signed)
Routine follow-up  Encounter Diagnosis  Name Primary?   Rupture of left Achilles tendon, subsequent encounter 04/04/21 Yes    50 year old male ruptured Achilles tendon 25 days ago has been in a plantarflexion cast he is here for cast change  He is diabetic and we decided to go with nonoperative treatment  He will be placed in a cast again for 3 weeks with gravity assisted plantarflexion  Skin was normal  At the 6-week mark we can put him in a neutral position  Acute uncomplicated illness injury no other dated to evaluate, risk moderate based on diabetes with the risk of skin breakdown and possible poor healing

## 2021-05-11 DIAGNOSIS — D2361 Other benign neoplasm of skin of right upper limb, including shoulder: Secondary | ICD-10-CM | POA: Insufficient documentation

## 2021-05-20 ENCOUNTER — Ambulatory Visit (INDEPENDENT_AMBULATORY_CARE_PROVIDER_SITE_OTHER): Payer: 59 | Admitting: Orthopedic Surgery

## 2021-05-20 ENCOUNTER — Encounter: Payer: Self-pay | Admitting: Orthopedic Surgery

## 2021-05-20 ENCOUNTER — Other Ambulatory Visit: Payer: Self-pay

## 2021-05-20 VITALS — Resp 16 | Ht 73.0 in | Wt 219.0 lb

## 2021-05-20 DIAGNOSIS — S86012D Strain of left Achilles tendon, subsequent encounter: Secondary | ICD-10-CM

## 2021-05-20 NOTE — Progress Notes (Signed)
Chief Complaint  Patient presents with   Ankle Injury    Left Achilles tendon rupture 04/04/21    51 year old male he had an Achilles tendon rupture he is doing well he has had restoration of integrity of the tendon with a normal Thompson test were placing him in a cam walker 10 degrees plantarflexion 10 degrees dorsiflexion weight-bear as tolerated follow-up in 4 weeks

## 2021-06-12 ENCOUNTER — Emergency Department (HOSPITAL_COMMUNITY): Payer: Medicare Other

## 2021-06-12 ENCOUNTER — Encounter (HOSPITAL_COMMUNITY): Payer: Self-pay | Admitting: *Deleted

## 2021-06-12 ENCOUNTER — Emergency Department (HOSPITAL_COMMUNITY)
Admission: EM | Admit: 2021-06-12 | Discharge: 2021-06-12 | Disposition: A | Discharge status: Home / Self Care | Payer: Medicare Other | Attending: Emergency Medicine | Admitting: Emergency Medicine

## 2021-06-12 ENCOUNTER — Other Ambulatory Visit: Payer: Self-pay

## 2021-06-12 DIAGNOSIS — I10 Essential (primary) hypertension: Secondary | ICD-10-CM | POA: Insufficient documentation

## 2021-06-12 DIAGNOSIS — E1140 Type 2 diabetes mellitus with diabetic neuropathy, unspecified: Secondary | ICD-10-CM | POA: Insufficient documentation

## 2021-06-12 DIAGNOSIS — Z85820 Personal history of malignant melanoma of skin: Secondary | ICD-10-CM | POA: Diagnosis not present

## 2021-06-12 DIAGNOSIS — Z20822 Contact with and (suspected) exposure to covid-19: Secondary | ICD-10-CM | POA: Insufficient documentation

## 2021-06-12 DIAGNOSIS — L0211 Cutaneous abscess of neck: Secondary | ICD-10-CM | POA: Diagnosis present

## 2021-06-12 DIAGNOSIS — Z79899 Other long term (current) drug therapy: Secondary | ICD-10-CM | POA: Diagnosis not present

## 2021-06-12 DIAGNOSIS — G629 Polyneuropathy, unspecified: Secondary | ICD-10-CM | POA: Diagnosis not present

## 2021-06-12 DIAGNOSIS — L03221 Cellulitis of neck: Secondary | ICD-10-CM | POA: Diagnosis not present

## 2021-06-12 DIAGNOSIS — Z7984 Long term (current) use of oral hypoglycemic drugs: Secondary | ICD-10-CM | POA: Insufficient documentation

## 2021-06-12 DIAGNOSIS — L0390 Cellulitis, unspecified: Secondary | ICD-10-CM

## 2021-06-12 DIAGNOSIS — E119 Type 2 diabetes mellitus without complications: Secondary | ICD-10-CM

## 2021-06-12 LAB — CBC WITH DIFFERENTIAL/PLATELET
Abs Immature Granulocytes: 0.06 10*3/uL (ref 0.00–0.07)
Basophils Absolute: 0.1 10*3/uL (ref 0.0–0.1)
Basophils Relative: 1 %
Eosinophils Absolute: 0.1 10*3/uL (ref 0.0–0.5)
Eosinophils Relative: 1 %
HCT: 46.4 % (ref 39.0–52.0)
Hemoglobin: 16.6 g/dL (ref 13.0–17.0)
Immature Granulocytes: 1 %
Lymphocytes Relative: 21 %
Lymphs Abs: 2.5 10*3/uL (ref 0.7–4.0)
MCH: 29.9 pg (ref 26.0–34.0)
MCHC: 35.8 g/dL (ref 30.0–36.0)
MCV: 83.5 fL (ref 80.0–100.0)
Monocytes Absolute: 0.9 10*3/uL (ref 0.1–1.0)
Monocytes Relative: 7 %
Neutro Abs: 8 10*3/uL — ABNORMAL HIGH (ref 1.7–7.7)
Neutrophils Relative %: 69 %
Platelets: 174 10*3/uL (ref 150–400)
RBC: 5.56 MIL/uL (ref 4.22–5.81)
RDW: 12.1 % (ref 11.5–15.5)
WBC: 11.5 10*3/uL — ABNORMAL HIGH (ref 4.0–10.5)
nRBC: 0 % (ref 0.0–0.2)

## 2021-06-12 LAB — COMPREHENSIVE METABOLIC PANEL
ALT: 20 U/L (ref 0–44)
AST: 16 U/L (ref 15–41)
Albumin: 4.4 g/dL (ref 3.5–5.0)
Alkaline Phosphatase: 137 U/L — ABNORMAL HIGH (ref 38–126)
Anion gap: 13 (ref 5–15)
BUN: 16 mg/dL (ref 6–20)
CO2: 24 mmol/L (ref 22–32)
Calcium: 9.6 mg/dL (ref 8.9–10.3)
Chloride: 100 mmol/L (ref 98–111)
Creatinine, Ser: 0.6 mg/dL — ABNORMAL LOW (ref 0.61–1.24)
GFR, Estimated: >60 mL/min (ref >60)
Glucose, Bld: 301 mg/dL — ABNORMAL HIGH (ref 70–99)
Potassium: 3.8 mmol/L (ref 3.5–5.1)
Sodium: 137 mmol/L (ref 135–145)
Total Bilirubin: 0.7 mg/dL (ref 0.3–1.2)
Total Protein: 7.7 g/dL (ref 6.5–8.1)

## 2021-06-12 LAB — RESP PANEL BY RT-PCR (FLU A&B, COVID) ARPGX2
Influenza A by PCR: NEGATIVE
Influenza B by PCR: NEGATIVE
SARS Coronavirus 2 by RT PCR: NEGATIVE

## 2021-06-12 MED ORDER — CEPHALEXIN 500 MG PO CAPS: 500.0000 mg | ORAL_CAPSULE | Freq: Three times a day (TID) | ORAL | 0 refills | Status: DC

## 2021-06-12 MED ORDER — IOHEXOL 350 MG/ML SOLN
60.0000 mL | Freq: Once | INTRAVENOUS | Status: AC | PRN
  Administered 2021-06-12: 60 mL via INTRAVENOUS

## 2021-06-12 MED ORDER — HYDRALAZINE HCL 20 MG/ML IJ SOLN
5.0000 mg | Freq: Once | INTRAMUSCULAR | Status: AC
  Administered 2021-06-12: 5 mg via INTRAVENOUS
  Filled 2021-06-12: qty 1

## 2021-06-12 MED ORDER — DOXYCYCLINE HYCLATE 100 MG PO CAPS: ORAL_CAPSULE | ORAL | 0 refills | Status: DC

## 2021-06-12 MED ORDER — VANCOMYCIN HCL IN DEXTROSE 1-5 GM/200ML-% IV SOLN
1000.0000 mg | Freq: Once | INTRAVENOUS | Status: AC
  Administered 2021-06-12: 1000 mg via INTRAVENOUS
  Filled 2021-06-12: qty 200

## 2021-06-12 MED ORDER — SODIUM CHLORIDE 0.9 % IV BOLUS
500.0000 mL | Freq: Once | INTRAVENOUS | Status: AC
  Administered 2021-06-12: 500 mL via INTRAVENOUS

## 2021-06-12 NOTE — ED Notes (Signed)
Pt noted with elevated bp, states he took his antihypertensives just prior to coming in today.

## 2021-06-12 NOTE — Discharge Instructions (Addendum)
Follow-up with your doctor next week to check the infection and your blood pressure.  If you get worse please come back to the hospital

## 2021-06-12 NOTE — ED Triage Notes (Signed)
Pt c/o abscess to back of neck x 3 days; pt states he has been taking ibuprofen and applying warm compresses with no ease of pain

## 2021-06-12 NOTE — Consult Note (Signed)
Medical Consultation   Albert Mcdonald  [id number]  DOB: 1971-01-23  DOA: 06/12/2021  PCP: Leslie Andrea, MD   Outpatient Specialists: Dr. Aline Brochure (orthopedic doctor).   Requesting physician: Dr. Roderic Palau   Reason for consultation: Neck cellulitis  History of Present Illness: Albert Mcdonald is an 50 y.o. male hypertension, depression, type 2 diabetes mellitus, hyperlipidemia and diabetic neuropathy; who presented to the hospital secondary to 3 days of neck discomfort and induration; on the right side of his neck at the base patient with indurated area and scabbed Boil.  Mild surrounding erythematous changes seen.  No active drainage appreciated.  Mild tenderness to palpation observed on examination.  Patient reports skin changes have been present for the last 3-4 days and failed to improve to the use of ibuprofen and warm compresses.  Patient has not received any antibiotic therapy.  He presented to the emergency department for further evaluation and management. No nausea, no vomiting, no fever, no chills, no abdominal pain, no dysuria, no hematochezia, no melena, no dysuria, no hematuria, no focal neurological deficits.  In the ED CT scan soft tissue of the neck demonstrating no drainable abscess, mild to moderate inflammatory/cellulitic changes.  IV vancomycin given and TRH consulted for recommendations regarding the need for further management as an inpatient.   Review of Systems:  As per HPI otherwise 10 point review of systems negative.   Past Medical History: Past Medical History:  Diagnosis Date   ALS (amyotrophic lateral sclerosis) ([place])    patient denied on 11/01/12 -- has KLS not ALS   Diabetes mellitus    Family history of anesthesia complication    Hyperlipidemia    Hypertension    Kleine-Levin syndrome    OCD (obsessive compulsive disorder)    Restless leg     Past Surgical History: Past Surgical History:  Procedure Laterality Date    ANTERIOR CERVICAL DECOMP/DISCECTOMY FUSION  11/09/2012   Procedure: ANTERIOR CERVICAL DECOMPRESSION/DISCECTOMY FUSION 3 LEVELS C4-C7 ;  Surgeon: Melina Schools, MD;  Location: [facility];  Service: Orthopedics;;  ACDF C4-C7      Allergies:   Allergies  Allergen Reactions   Ace Inhibitors Rash    Social History:  reports that he has never smoked. He has never used smokeless tobacco. He reports current alcohol use. He reports that he does not use drugs.   Family History: Patient reported history of hypertension and hyperlipidemia; otherwise noncontributory.  Physical Exam: Vitals:   06/12/21 0834 06/12/21 0849 06/12/21 1044 06/12/21 1130  BP: (!) 160/109  (!) 166/119 (!) 163/123  Pulse: 98  94 (!) 102  Resp: '16  14 18  '$ Temp:      TempSrc:      SpO2: 97%  96% 100%  Weight:  99.3 kg    Height:  '6\' 1"'$  (1.854 m)      Constitutional:Alert and awake, oriented x3, not in any acute distress.  Afebrile; in no acute distress. Eyes: PERLA, EOMI, irises appear normal, anicteric sclera,  ENMT: external ears and nose appear normal, Lips appears normal, oropharynx mucosa, tongue, posterior pharynx appear normal; normal hearing. Neck: neck appears normal, no masses, normal ROM, no thyromegaly, no JVD  CVS: S1-S2 clear, no murmur rubs or gallops, no LE edema, normal pedal pulses  Respiratory:  clear to auscultation bilaterally, no wheezing, rales or rhonchi. Respiratory effort normal. No accessory muscle use.  Abdomen: soft nontender, nondistended, normal bowel sounds,  no hepatosplenomegaly, no hernias  Musculoskeletal: : no cyanosis, clubbing or edema noted bilaterally left foot in bootcam.                Neuro: Cranial nerves II-XII intact, strength, sensation, reflexes Psych: judgement and insight appear normal, stable mood and affect, no suicidal ideation or hallucinations. Skin: Patient with indurated/scabbed area in the back of his neck (right side); mild surrounding erythema.  No active  drainage, mild tenderness to palpation.   Data reviewed:  I have personally reviewed following labs and imaging studies Labs:  CBC: Recent Labs  Lab 06/12/21 0855  WBC 11.5*  NEUTROABS 8.0*  HGB 16.6  HCT 46.4  MCV 83.5  PLT [id number]    Basic Metabolic Panel: Recent Labs  Lab 06/12/21 0855  NA 137  K 3.8  CL 100  CO2 24  GLUCOSE 301*  BUN 16  CREATININE 0.60*  CALCIUM 9.6   GFR Estimated Creatinine Clearance: 138.6 mL/min (A) (by C-G formula based on SCr of 0.6 mg/dL (L)).  Liver Function Tests: Recent Labs  Lab 06/12/21 0855  AST 16  ALT 20  ALKPHOS 137*  BILITOT 0.7  PROT 7.7  ALBUMIN 4.4   Urinalysis    Component Value Date/Time   COLORURINE YELLOW 03/14/2017 2022   APPEARANCEUR CLEAR 03/14/2017 2022   LABSPEC 1.027 03/14/2017 2022   PHURINE 6.0 03/14/2017 2022   GLUCOSEU 50 (A) 03/14/2017 2022   HGBUR NEGATIVE 03/14/2017 2022   BILIRUBINUR NEGATIVE 03/14/2017 2022   KETONESUR 20 (A) 03/14/2017 2022   PROTEINUR 30 (A) 03/14/2017 2022   UROBILINOGEN 1.0 10/28/2009 0030   NITRITE NEGATIVE 03/14/2017 2022   LEUKOCYTESUR NEGATIVE 03/14/2017 2022    Sepsis Labs Invalid input(s): PROCALCITONIN,  WBC,  LACTICIDVEN Microbiology Recent Results (from the past 240 hour(s))  Resp Panel by RT-PCR (Flu A&B, Covid) Nasopharyngeal Swab     Status: None   Collection Time: 06/12/21  8:55 AM   Specimen: Nasopharyngeal Swab; Nasopharyngeal(NP) swabs in vial transport medium  Result Value Ref Range Status   SARS Coronavirus 2 by RT PCR NEGATIVE NEGATIVE Final    Comment: (NOTE) SARS-CoV-2 target nucleic acids are NOT DETECTED.  The SARS-CoV-2 RNA is generally detectable in upper respiratory specimens during the acute phase of infection. The lowest concentration of SARS-CoV-2 viral copies this assay can detect is 138 copies/mL. A negative result does not preclude SARS-Cov-2 infection and should not be used as the sole basis for treatment or other patient  management decisions. A negative result may occur with  improper specimen collection/handling, submission of specimen other than nasopharyngeal swab, presence of viral mutation(s) within the areas targeted by this assay, and inadequate number of viral copies(<138 copies/mL). A negative result must be combined with clinical observations, patient history, and epidemiological information. The expected result is Negative.  Fact Sheet for Patients:  EntrepreneurPulse.com.au  Fact Sheet for Healthcare Providers:  IncredibleEmployment.be  This test is no t yet approved or cleared by the [place] FDA and  has been authorized for detection and/or diagnosis of SARS-CoV-2 by FDA under an Emergency Use Authorization (EUA). This EUA will remain  in effect (meaning this test can be used) for the duration of the COVID-19 declaration under Section 564(b)(1) of the Act, 21 U.S.C.section 360bbb-3(b)(1), unless the authorization is terminated  or revoked sooner.       Influenza A by PCR NEGATIVE NEGATIVE Final   Influenza B by PCR NEGATIVE NEGATIVE Final    Comment: (NOTE) The Xpert Xpress  SARS-CoV-2/FLU/RSV plus assay is intended as an aid in the diagnosis of influenza from Nasopharyngeal swab specimens and should not be used as a sole basis for treatment. Nasal washings and aspirates are unacceptable for Xpert Xpress SARS-CoV-2/FLU/RSV testing.  Fact Sheet for Patients: EntrepreneurPulse.com.au  Fact Sheet for Healthcare Providers: IncredibleEmployment.be  This test is not yet approved or cleared by the [place] FDA and has been authorized for detection and/or diagnosis of SARS-CoV-2 by FDA under an Emergency Use Authorization (EUA). This EUA will remain in effect (meaning this test can be used) for the duration of the COVID-19 declaration under Section 564(b)(1) of the Act, 21 U.S.C. section 360bbb-3(b)(1),  unless the authorization is terminated or revoked.  Performed at [facility], [address] [place], [place] [address]     Inpatient Medications:   Scheduled Meds: Continuous Infusions:   Radiological Exams on Admission: CT Soft Tissue Neck W Contrast  Result Date: 06/12/2021 CLINICAL DATA:  Abscess to the back of the neck for 3 days EXAM: CT NECK WITH CONTRAST TECHNIQUE: Multidetector CT imaging of the neck was performed using the standard protocol following the bolus administration of intravenous contrast. CONTRAST:  43m OMNIPAQUE IOHEXOL 350 MG/ML SOLN COMPARISON:  None. FINDINGS: Pharynx and larynx: The nasal cavity and nasopharynx are normal. The oral cavity and oropharynx are normal. The hypopharynx and larynx are normal. No mass lesion or abscess is identified. Salivary glands: The parotid and submandibular glands are unremarkable. Thyroid: There is a heterogeneously calcified 1.9 cm right thyroid nodule. This was present in 2016 but appears increased in size. Lymph nodes: There is a 0.9 cm lymph node in the right suboccipital soft tissues and scattered prominent lymph nodes in the right posterior triangle measuring up to 0.8 cm. Vascular: Unremarkable. Limited intracranial: The imaged intracranial compartment is unremarkable. Visualized orbits: The imaged globes and orbits are unremarkable. Mastoids and visualized paranasal sinuses: The imaged paranasal sinuses are clear. The mastoid air cells are clear. Skeleton: The patient is status post C4 through C7 ACDF. There is advanced adjacent segment disease at C3-C4 and C7-T1 with bulky anterior osteophytes. There is a disc protrusion at C3-C4 resulting in moderate spinal canal stenosis. Upper chest: The lung apices are clear. Other: There is prominent skin thickening in the posterior neck predominantly to the right of midline at the indicated site of concern. There is inflammatory fat stranding in the subcutaneous fat as well as focal soft  tissue thickening in the right lower neck. There is no organized or drainable fluid collection. There are additional areas of nodularity in the cutaneous/subcutaneous tissues of the back (for example 2-104, 2-81). IMPRESSION: 1. Skin thickening in the posterior neck to the right of midline with underlying inflammatory fat stranding most likely reflects cellulitis. There is no organized or drainable fluid collection identified. 2. Additional foci of soft tissue nodularity in the cutaneous/subcutaneous tissues of the neck and back are indeterminate. Correlate with physical exam, as cutaneous neoplasm is not excluded. 3. Status post C4 through C7 ACDF with advanced adjacent segment disease at C3-C4 and C7-T1. Probable moderate spinal canal stenosis at C3-C4. 4. 1.9 cm right thyroid nodule. Recommend nonemergent thyroid ultrasound, if not already performed elsewhere. Electronically Signed   By: PValetta MoleM.D.   On: 06/12/2021 10:17    Impression/Recommendations 1-neck cellulitis -CT soft tissue of the neck demonstrating no drainable abscess, mild to moderate inflammatory changes and cellulitic process. -Continue the use of ibuprofen as scheduled for 3 days (600 mg 3 times daily)  and continue the use of warm compresses. -Recommending treatment with antibiotics using doxycycline 100 mg twice a day and Keflex 500 mg 3 times daily for 10 days. -Follow-up with PCP in 1 week; and a specific red flags discussions for return to the ED in case that he failed antibiotic therapy as an outpatient.  2-essential hypertension -Resume home antihypertensive regimen -Patient advised to follow heart healthy/low-sodium diet.  3-type 2 diabetes -Continue treatment with metformin and Farxiga -Continue outpatient follow-up with PCP to further adjust hypoglycemic regimen as required.  4-diabetic neuropathy -Continue treatment with Neurontin.  5-depression -Stable mood -No suicidal ideation or hallucination -Continue  treatment with Prozac.     Thank you for this consultation.  Our [facility] hospitalist team will follow the patient with you.   Time Spent: 40 minutes  [place] Dubois M.D. [facility] 06/12/2021, 12:20 PM

## 2021-06-12 NOTE — ED Provider Notes (Signed)
[facility] EMERGENCY DEPARTMENT Provider Note   CSN: [id number] Arrival date & time: 06/12/21  [id number]     History Chief Complaint  Patient presents with   Abscess    Albert Mcdonald is a 50 y.o. male.  Patient complains of swelling in the back of his neck.  No fever no chills  The history is provided by the patient and medical records. No language interpreter was used.  Abscess Abscess location: Posterior neck. Abscess quality: not draining   Red streaking: no   Progression:  Worsening Chronicity:  New Context: diabetes   Relieved by:  Nothing Worsened by:  Nothing Ineffective treatments:  None tried Associated symptoms: no fatigue and no headaches       Past Medical History:  Diagnosis Date   ALS (amyotrophic lateral sclerosis) ([place])    patient denied on 11/01/12 -- has KLS not ALS   Diabetes mellitus    Family history of anesthesia complication    Hyperlipidemia    Hypertension    Kleine-Levin syndrome    OCD (obsessive compulsive disorder)    Restless leg     Patient Active Problem List   Diagnosis Date Noted   Benign neoplasm of skin of right shoulder 05/11/2021   Achilles rupture, left 04/29/2021   Epidermoid cyst of skin 04/13/2021   Benign neoplasm of skin of neck 04/13/2021   Noncompliance with medication treatment due to overuse of medication 03/28/2021   Osteomyelitis ([place]) 10/23/2020   Gram-positive bacteremia 10/21/2020   Soft tissue infection of thoracic spine 10/21/2020   Cellulitis 10/19/2020   Discitis of cervicothoracic region 10/19/2020   Elevated WBCs 10/19/2020   Hyperglycemia 10/19/2020   Depression 08/09/2017   Diabetic neuropathy ([place]) 07/08/2017   Acute encephalopathy 03/15/2017   Drug overdose [id number]   Acute metabolic encephalopathy [id number]   Polysubstance abuse ([place]) 03/14/2017   Cocaine abuse ([place])    Type 2 diabetes mellitus ([place])    Hyperlipidemia    Hypertension    Restless leg    Family history of anesthesia  complication    Cervical spondylosis with myelopathy 09/29/2012   OCD (obsessive compulsive disorder) 03/16/2011   Kleine-Levin syndrome 03/16/2011   HLD (hyperlipidemia) 03/16/2011    Past Surgical History:  Procedure Laterality Date   ANTERIOR CERVICAL DECOMP/DISCECTOMY FUSION  11/09/2012   Procedure: ANTERIOR CERVICAL DECOMPRESSION/DISCECTOMY FUSION 3 LEVELS C4-C7 ;  Surgeon: Melina Schools, MD;  Location: [facility];  Service: Orthopedics;;  ACDF C4-C7        History reviewed. No pertinent family history.  Social History   Tobacco Use   Smoking status: Never   Smokeless tobacco: Never  Vaping Use   Vaping Use: Never used  Substance Use Topics   Alcohol use: Yes   Drug use: No    Home Medications Prior to Admission medications   Medication Sig Start Date End Date Taking? Authorizing Provider  amLODipine (NORVASC) 10 MG tablet Take 1 tablet (10 mg total) by mouth daily. 06/01/17  Yes Ena Dawley, Tiffany S, PA-C  cephALEXin (KEFLEX) 500 MG capsule Take 1 capsule (500 mg total) by mouth 3 (three) times daily. 06/12/21  Yes Milton Ferguson, MD  cyclobenzaprine (FLEXERIL) 10 MG tablet Take 10 mg by mouth 3 (three) times daily.   Yes [provider]  doxycycline (VIBRAMYCIN) 100 MG capsule One po bid 06/12/21  Yes Milton Ferguson, MD  FARXIGA 10 MG TABS tablet Take 10 mg by mouth daily. 05/25/21  Yes [provider]  metFORMIN (GLUCOPHAGE) 1000 MG tablet  Take 1,000 mg by mouth 2 (two) times daily. 05/25/21  Yes [provider]  benzonatate (TESSALON) 100 MG capsule Take 1 capsule (100 mg total) by mouth every 8 (eight) hours. Patient not taking: Reported on 06/12/2021 06/22/19   Domenic Moras, PA-C  FLUoxetine (PROZAC) 20 MG tablet Take 1 tablet (20 mg total) daily by mouth. Patient not taking: Reported on 06/12/2021 08/09/17   Charlott Rakes, MD  folic acid (FOLVITE) 1 MG tablet Take 1 tablet (1 mg total) by mouth daily. Patient not taking: Reported on 06/12/2021 03/17/17    Damita Lack, MD  gabapentin (NEURONTIN) 300 MG capsule Take 1 capsule (300 mg total) by mouth 2 (two) times daily. Patient not taking: Reported on 06/12/2021 06/01/17   Brayton Caves, PA-C  meloxicam (MOBIC) 7.5 MG tablet Take 1 tablet (7.5 mg total) by mouth daily. Patient not taking: Reported on 06/12/2021 07/08/17   Charlott Rakes, MD  Multiple Vitamin (MULTIVITAMIN WITH MINERALS) TABS tablet Take 1 tablet by mouth daily. Patient not taking: Reported on 06/12/2021 03/17/17   Damita Lack, MD  thiamine 100 MG tablet Take 1 tablet (100 mg total) by mouth daily. Patient not taking: Reported on 06/12/2021 03/17/17   Damita Lack, MD  triamcinolone cream (KENALOG) 0.1 % Apply 1 application 2 (two) times daily topically. Patient not taking: Reported on 06/12/2021 08/09/17   Charlott Rakes, MD    Allergies    Ace inhibitors  Review of Systems   Review of Systems  Constitutional:  Negative for appetite change and fatigue.  HENT:  Negative for congestion, ear discharge and sinus pressure.   Eyes:  Negative for discharge.  Respiratory:  Negative for cough.   Cardiovascular:  Negative for chest pain.  Gastrointestinal:  Negative for abdominal pain and diarrhea.  Genitourinary:  Negative for frequency and hematuria.  Musculoskeletal:  Negative for back pain.       Swelling to back and neck  Skin:  Negative for rash.  Neurological:  Negative for seizures and headaches.  Psychiatric/Behavioral:  Negative for hallucinations.    Physical Exam Updated Vital Signs BP (!) 137/98   Pulse (!) 101   Temp 98.2 F (36.8 C) (Oral)   Resp 17   Ht '6\' 1"'$  (1.854 m)   Wt 99.3 kg   SpO2 98%   BMI 28.89 kg/m   Physical Exam Vitals and nursing note reviewed.  Constitutional:      Appearance: He is well-developed.  HENT:     Head: Normocephalic.     Nose: Nose normal.  Eyes:     General: No scleral icterus.    Conjunctiva/sclera: Conjunctivae normal.  Neck:     Thyroid: No  thyromegaly.     Comments: Large swelling the back of her neck  Cardiovascular:     Rate and Rhythm: Normal rate and regular rhythm.     Heart sounds: No murmur heard.   No friction rub. No gallop.  Pulmonary:     Breath sounds: No stridor. No wheezing or rales.  Chest:     Chest wall: No tenderness.  Abdominal:     General: There is no distension.     Tenderness: There is no abdominal tenderness. There is no rebound.  Musculoskeletal:        General: Normal range of motion.     Cervical back: Neck supple.  Lymphadenopathy:     Cervical: No cervical adenopathy.  Skin:    Findings: No erythema or rash.  Neurological:  Mental Status: He is alert and oriented to person, place, and time.     Motor: No abnormal muscle tone.     Coordination: Coordination normal.  Psychiatric:        Behavior: Behavior normal.    ED Results / Procedures / Treatments   Labs (all labs ordered are listed, but only abnormal results are displayed) Labs Reviewed  CBC WITH DIFFERENTIAL/PLATELET - Abnormal; Notable for the following components:      Result Value   WBC 11.5 (*)    Neutro Abs 8.0 (*)    All other components within normal limits  COMPREHENSIVE METABOLIC PANEL - Abnormal; Notable for the following components:   Glucose, Bld 301 (*)    Creatinine, Ser 0.60 (*)    Alkaline Phosphatase 137 (*)    All other components within normal limits  RESP PANEL BY RT-PCR (FLU A&B, COVID) [id number]    EKG None  Radiology CT Soft Tissue Neck W Contrast  Result Date: 06/12/2021 CLINICAL DATA:  Abscess to the back of the neck for 3 days EXAM: CT NECK WITH CONTRAST TECHNIQUE: Multidetector CT imaging of the neck was performed using the standard protocol following the bolus administration of intravenous contrast. CONTRAST:  50m OMNIPAQUE IOHEXOL 350 MG/ML SOLN COMPARISON:  None. FINDINGS: Pharynx and larynx: The nasal cavity and nasopharynx are normal. The oral cavity and oropharynx are normal. The  hypopharynx and larynx are normal. No mass lesion or abscess is identified. Salivary glands: The parotid and submandibular glands are unremarkable. Thyroid: There is a heterogeneously calcified 1.9 cm right thyroid nodule. This was present in 2016 but appears increased in size. Lymph nodes: There is a 0.9 cm lymph node in the right suboccipital soft tissues and scattered prominent lymph nodes in the right posterior triangle measuring up to 0.8 cm. Vascular: Unremarkable. Limited intracranial: The imaged intracranial compartment is unremarkable. Visualized orbits: The imaged globes and orbits are unremarkable. Mastoids and visualized paranasal sinuses: The imaged paranasal sinuses are clear. The mastoid air cells are clear. Skeleton: The patient is status post C4 through C7 ACDF. There is advanced adjacent segment disease at C3-C4 and C7-T1 with bulky anterior osteophytes. There is a disc protrusion at C3-C4 resulting in moderate spinal canal stenosis. Upper chest: The lung apices are clear. Other: There is prominent skin thickening in the posterior neck predominantly to the right of midline at the indicated site of concern. There is inflammatory fat stranding in the subcutaneous fat as well as focal soft tissue thickening in the right lower neck. There is no organized or drainable fluid collection. There are additional areas of nodularity in the cutaneous/subcutaneous tissues of the back (for example 2-104, 2-81). IMPRESSION: 1. Skin thickening in the posterior neck to the right of midline with underlying inflammatory fat stranding most likely reflects cellulitis. There is no organized or drainable fluid collection identified. 2. Additional foci of soft tissue nodularity in the cutaneous/subcutaneous tissues of the neck and back are indeterminate. Correlate with physical exam, as cutaneous neoplasm is not excluded. 3. Status post C4 through C7 ACDF with advanced adjacent segment disease at C3-C4 and C7-T1. Probable  moderate spinal canal stenosis at C3-C4. 4. 1.9 cm right thyroid nodule. Recommend nonemergent thyroid ultrasound, if not already performed elsewhere. Electronically Signed   By: PValetta MoleM.D.   On: 06/12/2021 10:17    Procedures Procedures   Medications Ordered in ED Medications  vancomycin (VANCOCIN) IVPB 1000 mg/200 mL premix (0 mg Intravenous Stopped 06/12/21 1011)  sodium chloride  0.9 % bolus 500 mL (0 mLs Intravenous Stopped 06/12/21 1011)  iohexol (OMNIPAQUE) 350 MG/ML injection 60 mL (60 mLs Intravenous Contrast Given 06/12/21 0936)  hydrALAZINE (APRESOLINE) injection 5 mg (5 mg Intravenous Given 06/12/21 1110)    ED Course  I have reviewed the triage vital signs and the nursing notes.  Pertinent labs & imaging results that were available during my care of the patient were reviewed by me and considered in my medical decision making (see chart for details).    MDM Rules/Calculators/A&P                           Patient with cellulitis to the back of his neck.  He was seen by hospitalist and it was decided that the patient can be treated as an outpatient.  Patient was given doxycycline and Keflex Final Clinical Impression(s) / ED Diagnoses Final diagnoses:  Cellulitis, unspecified cellulitis site    Rx / DC Orders ED Discharge Orders          Ordered    doxycycline (VIBRAMYCIN) 100 MG capsule        06/12/21 1301    cephALEXin (KEFLEX) 500 MG capsule  3 times daily        06/12/21 1301             Milton Ferguson, MD 06/15/21 1709

## 2021-06-17 ENCOUNTER — Encounter: Payer: Self-pay | Admitting: Orthopedic Surgery

## 2021-06-17 ENCOUNTER — Other Ambulatory Visit: Payer: Self-pay

## 2021-06-17 ENCOUNTER — Ambulatory Visit (INDEPENDENT_AMBULATORY_CARE_PROVIDER_SITE_OTHER): Payer: Medicare Other | Admitting: Orthopedic Surgery

## 2021-06-17 VITALS — BP 162/121 | HR 107 | Ht 73.0 in | Wt 226.0 lb

## 2021-06-17 DIAGNOSIS — S86012D Strain of left Achilles tendon, subsequent encounter: Secondary | ICD-10-CM | POA: Diagnosis not present

## 2021-06-17 NOTE — Progress Notes (Signed)
Chief Complaint  Patient presents with   Ankle Pain    Left achilles rupture DOI 04/04/21    50 year old male ruptured his Achilles on April 04, 2021 we opted for nonoperative treatment with casting and plantarflexion followed by cam walker.  He 74 days or 10 weeks and 4 days into his injury he has no pain he has reconstituted his tendon his [place] test is returned to normal he has excellent plantarflexion strength normal range of motion  We can remove the boot I told him to rest for the first month do exercises to strengthen of the tendon then he can return to normal activities  Follow-up as needed

## 2021-07-13 ENCOUNTER — Encounter (HOSPITAL_COMMUNITY): Payer: Self-pay | Admitting: Radiology

## 2021-07-27 ENCOUNTER — Other Ambulatory Visit: Payer: Self-pay | Admitting: Otolaryngology

## 2021-07-27 ENCOUNTER — Other Ambulatory Visit: Payer: Self-pay | Admitting: Nurse Practitioner

## 2021-07-27 ENCOUNTER — Other Ambulatory Visit (HOSPITAL_COMMUNITY): Payer: Self-pay | Admitting: Nurse Practitioner

## 2021-07-27 DIAGNOSIS — R9389 Abnormal findings on diagnostic imaging of other specified body structures: Secondary | ICD-10-CM

## 2021-08-04 ENCOUNTER — Other Ambulatory Visit: Payer: Self-pay

## 2021-08-04 ENCOUNTER — Ambulatory Visit (HOSPITAL_COMMUNITY)
Admission: RE | Admit: 2021-08-04 | Discharge: 2021-08-04 | Disposition: A | Discharge status: Home / Self Care | Payer: Medicare Other | Source: Ambulatory Visit | Attending: Nurse Practitioner | Admitting: Nurse Practitioner

## 2021-08-04 DIAGNOSIS — R9389 Abnormal findings on diagnostic imaging of other specified body structures: Secondary | ICD-10-CM | POA: Diagnosis present

## 2021-08-11 ENCOUNTER — Other Ambulatory Visit: Payer: Self-pay | Admitting: Family Medicine

## 2021-08-11 DIAGNOSIS — E041 Nontoxic single thyroid nodule: Secondary | ICD-10-CM

## 2021-08-17 ENCOUNTER — Other Ambulatory Visit: Payer: Self-pay

## 2021-08-17 ENCOUNTER — Ambulatory Visit
Admission: EM | Admit: 2021-08-17 | Discharge: 2021-08-17 | Disposition: A | Discharge status: Home / Self Care | Payer: Medicare Other | Attending: Internal Medicine | Admitting: Internal Medicine

## 2021-08-17 ENCOUNTER — Encounter: Payer: Self-pay | Admitting: Emergency Medicine

## 2021-08-17 DIAGNOSIS — L03312 Cellulitis of back [any part except buttock]: Secondary | ICD-10-CM

## 2021-08-17 MED ORDER — DOXYCYCLINE HYCLATE 100 MG PO CAPS: 100.0000 mg | ORAL_CAPSULE | Freq: Two times a day (BID) | ORAL | 0 refills | Status: AC

## 2021-08-17 MED ORDER — DOXYCYCLINE HYCLATE 100 MG PO CAPS: 100.0000 mg | ORAL_CAPSULE | Freq: Two times a day (BID) | ORAL | 0 refills | Status: DC

## 2021-08-17 NOTE — Discharge Instructions (Signed)
Warm compress Take ibuprofen as needed for pain If symptoms worsens please return to urgent care If the area becomes fluctuant please return to the urgent care to be reevaluated.

## 2021-08-17 NOTE — ED Triage Notes (Signed)
Abscess on right shoulder, first noticed it yesterday.

## 2021-08-19 NOTE — ED Provider Notes (Signed)
[facility]    CSN: [id number] Arrival date & time: 08/17/21  1818      History   Chief Complaint Chief Complaint  Patient presents with   Abscess    HPI Albert Mcdonald is a 50 y.o. male comes to the urgent care with right upper back pain of 3 days duration.  Patient noticed a small bump in the right upper back.  Over the past few days it has become more tender and looks more swollen.  It is erythematous.  He denies any fever or chills.  No trauma to the area.Marland Kitchen   HPI  Past Medical History:  Diagnosis Date   ALS (amyotrophic lateral sclerosis) ([place])    patient denied on 11/01/12 -- has KLS not ALS   Diabetes mellitus    Family history of anesthesia complication    Hyperlipidemia    Hypertension    Kleine-Levin syndrome    OCD (obsessive compulsive disorder)    Restless leg     Patient Active Problem List   Diagnosis Date Noted   Benign neoplasm of skin of right shoulder 05/11/2021   Achilles rupture, left 04/29/2021   Epidermoid cyst of skin 04/13/2021   Benign neoplasm of skin of neck 04/13/2021   Noncompliance with medication treatment due to overuse of medication 03/28/2021   Osteomyelitis ([place]) 10/23/2020   Gram-positive bacteremia 10/21/2020   Soft tissue infection of thoracic spine 10/21/2020   Cellulitis 10/19/2020   Discitis of cervicothoracic region 10/19/2020   Elevated WBCs 10/19/2020   Hyperglycemia 10/19/2020   Depression 08/09/2017   Diabetic neuropathy ([place]) 07/08/2017   Acute encephalopathy 03/15/2017   Drug overdose 42/68/3419   Acute metabolic encephalopathy 62/22/9798   Polysubstance abuse ([place]) 03/14/2017   Cocaine abuse ([place])    Type 2 diabetes mellitus ([place])    Hyperlipidemia    Hypertension    Restless leg    Family history of anesthesia complication    Cervical spondylosis with myelopathy 09/29/2012   OCD (obsessive compulsive disorder) 03/16/2011   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 Authorizing Provider  amLODipine (NORVASC) 10 MG tablet Take 1 tablet (10 mg total) by mouth daily. 06/01/17  Yes Ena Dawley, Tiffany S, PA-C  metFORMIN (GLUCOPHAGE) 1000 MG tablet Take 1,000 mg by mouth 2 (two) times daily. 05/25/21  Yes [provider]  cyclobenzaprine (FLEXERIL) 10 MG tablet Take 10 mg by mouth 3 (three) times daily.    [provider]  doxycycline (VIBRAMYCIN) 100 MG capsule Take 1 capsule (100 mg total) by mouth 2 (two) times daily for 7 days. One po bid 08/17/21 08/24/21  Chase Picket, MD  folic acid (FOLVITE) 1 MG tablet Take 1 tablet (1 mg total) by mouth daily. 03/17/17   Amin, Jeanella Flattery, MD  meloxicam (MOBIC) 7.5 MG tablet Take 1 tablet (7.5 mg total) by mouth daily. 07/08/17   Charlott Rakes, MD  Multiple Vitamin (MULTIVITAMIN WITH MINERALS) TABS tablet Take 1 tablet by mouth daily. 03/17/17   Amin, Jeanella Flattery, MD  thiamine 100 MG tablet Take 1 tablet (100 mg total) by  mouth daily. 03/17/17   Amin, Jeanella Flattery, MD  triamcinolone cream (KENALOG) 0.1 % Apply 1 application 2 (two) times daily topically. 08/09/17   Charlott Rakes, MD    Family History No family history on file.  Social History Social History   Tobacco Use   Smoking status: Never   Smokeless tobacco: Never  Vaping Use   Vaping Use: Never used  Substance Use Topics   Alcohol use: Yes   Drug use: No     Allergies   Ace inhibitors   Review of Systems Review of Systems  Skin:  Positive for color change, rash and wound.    Physical Exam Triage Vital Signs ED Triage Vitals  Enc Vitals Group      BP 08/17/21 1954 (!) 143/85     Pulse Rate 08/17/21 1954 98     Resp 08/17/21 1954 16     Temp 08/17/21 1954 98.3 F (36.8 C)     Temp src --      SpO2 08/17/21 1954 98 %     Weight --      Height --      Head Circumference --      Peak Flow --      Pain Score 08/17/21 1952 4     Pain Loc --      Pain Edu? --      Excl. in [place]? --    No data found.  Updated Vital Signs BP (!) 143/85   Pulse 98   Temp 98.3 F (36.8 C)   Resp 16   SpO2 98%   Visual Acuity Right Eye Distance:   Left Eye Distance:   Bilateral Distance:    Right Eye Near:   Left Eye Near:    Bilateral Near:     Physical Exam Vitals and nursing note reviewed.  Musculoskeletal:        General: Normal range of motion.  Skin:    Comments: Erythematous patch over the right upper back.  The erythematous area measures about 1 inch in the longest diameter.  The indurated area measures about 2 inches in the longest diameter.  No fluctuance noted.     UC Treatments / Results  Labs (all labs ordered are listed, but only abnormal results are displayed) Labs Reviewed - No data to display  EKG   Radiology No results found.  Procedures Procedures (including critical care time)  Medications Ordered in UC Medications - No data to display  Initial Impression / Assessment and Plan / UC Course  I have reviewed the triage vital signs and the nursing notes.  Pertinent labs & imaging results that were available during my care of the patient were reviewed by me and considered in my medical decision making (see chart for details).     1.  Cellulitis of the right upper back: Doxycycline 100 mg twice daily for 7 days Warm compresses recommended Ibuprofen as needed for pain Return to urgent care if you see discharge or experience worsening pain. Final Clinical Impressions(s) / UC Diagnoses   Final diagnoses:  Cellulitis of upper back excluding scapular region     Discharge Instructions      Warm  compress Take ibuprofen as needed for pain If symptoms worsens please return to urgent care If the area becomes fluctuant please return to the urgent care to be reevaluated.   ED Prescriptions     Medication Sig Dispense Auth. Provider   doxycycline (VIBRAMYCIN) 100 MG capsule  (Status: Discontinued)  Take 1 capsule (100 mg total) by mouth 2 (two) times daily for 7 days. One po bid 14 capsule Abdulahad Mederos, Myrene Galas, MD   doxycycline (VIBRAMYCIN) 100 MG capsule Take 1 capsule (100 mg total) by mouth 2 (two) times daily for 7 days. One po bid 14 capsule Adora Yeh, Myrene Galas, MD      PDMP not reviewed this encounter.   Chase Picket, MD 08/19/21 1056

## 2021-09-10 ENCOUNTER — Other Ambulatory Visit (HOSPITAL_COMMUNITY)
Admission: RE | Admit: 2021-09-10 | Discharge: 2021-09-10 | Disposition: A | Discharge status: Home / Self Care | Payer: Medicare Other | Source: Ambulatory Visit | Attending: Radiology | Admitting: Radiology

## 2021-09-10 ENCOUNTER — Ambulatory Visit
Admission: RE | Admit: 2021-09-10 | Discharge: 2021-09-10 | Disposition: A | Discharge status: Home / Self Care | Payer: Medicare Other | Source: Ambulatory Visit | Attending: Family Medicine | Admitting: Family Medicine

## 2021-09-10 DIAGNOSIS — E041 Nontoxic single thyroid nodule: Secondary | ICD-10-CM | POA: Diagnosis present

## 2021-09-11 LAB — CYTOLOGY - NON PAP

## 2022-02-25 ENCOUNTER — Emergency Department (HOSPITAL_COMMUNITY): Payer: Medicare Other

## 2022-02-25 ENCOUNTER — Inpatient Hospital Stay (HOSPITAL_COMMUNITY)
Admission: EM | Admit: 2022-02-25 | Discharge: 2022-02-27 | DRG: 617 | Disposition: A | Discharge status: Home / Self Care | Payer: Medicare Other | Attending: Internal Medicine | Admitting: Internal Medicine

## 2022-02-25 ENCOUNTER — Other Ambulatory Visit: Payer: Self-pay

## 2022-02-25 ENCOUNTER — Encounter (HOSPITAL_COMMUNITY): Payer: Self-pay | Admitting: Emergency Medicine

## 2022-02-25 DIAGNOSIS — F429 Obsessive-compulsive disorder, unspecified: Secondary | ICD-10-CM | POA: Diagnosis present

## 2022-02-25 DIAGNOSIS — E1169 Type 2 diabetes mellitus with other specified complication: Secondary | ICD-10-CM | POA: Diagnosis present

## 2022-02-25 DIAGNOSIS — E1165 Type 2 diabetes mellitus with hyperglycemia: Secondary | ICD-10-CM | POA: Diagnosis not present

## 2022-02-25 DIAGNOSIS — M86171 Other acute osteomyelitis, right ankle and foot: Secondary | ICD-10-CM | POA: Diagnosis present

## 2022-02-25 DIAGNOSIS — M869 Osteomyelitis, unspecified: Secondary | ICD-10-CM | POA: Diagnosis not present

## 2022-02-25 DIAGNOSIS — G2581 Restless legs syndrome: Secondary | ICD-10-CM | POA: Diagnosis present

## 2022-02-25 DIAGNOSIS — Z888 Allergy status to other drugs, medicaments and biological substances status: Secondary | ICD-10-CM

## 2022-02-25 DIAGNOSIS — D696 Thrombocytopenia, unspecified: Secondary | ICD-10-CM | POA: Diagnosis present

## 2022-02-25 DIAGNOSIS — I10 Essential (primary) hypertension: Secondary | ICD-10-CM | POA: Diagnosis not present

## 2022-02-25 DIAGNOSIS — G4713 Recurrent hypersomnia: Secondary | ICD-10-CM | POA: Diagnosis present

## 2022-02-25 DIAGNOSIS — L03031 Cellulitis of right toe: Secondary | ICD-10-CM | POA: Diagnosis present

## 2022-02-25 DIAGNOSIS — E119 Type 2 diabetes mellitus without complications: Secondary | ICD-10-CM

## 2022-02-25 DIAGNOSIS — F1910 Other psychoactive substance abuse, uncomplicated: Secondary | ICD-10-CM | POA: Diagnosis present

## 2022-02-25 DIAGNOSIS — L0390 Cellulitis, unspecified: Secondary | ICD-10-CM | POA: Diagnosis present

## 2022-02-25 DIAGNOSIS — Z7984 Long term (current) use of oral hypoglycemic drugs: Secondary | ICD-10-CM

## 2022-02-25 DIAGNOSIS — Z79899 Other long term (current) drug therapy: Secondary | ICD-10-CM | POA: Diagnosis not present

## 2022-02-25 DIAGNOSIS — M79674 Pain in right toe(s): Secondary | ICD-10-CM | POA: Diagnosis present

## 2022-02-25 DIAGNOSIS — R739 Hyperglycemia, unspecified: Secondary | ICD-10-CM

## 2022-02-25 DIAGNOSIS — E785 Hyperlipidemia, unspecified: Secondary | ICD-10-CM | POA: Diagnosis present

## 2022-02-25 DIAGNOSIS — E118 Type 2 diabetes mellitus with unspecified complications: Secondary | ICD-10-CM

## 2022-02-25 LAB — CBC WITH DIFFERENTIAL/PLATELET
Abs Immature Granulocytes: 0.07 10*3/uL (ref 0.00–0.07)
Basophils Absolute: 0.1 10*3/uL (ref 0.0–0.1)
Basophils Relative: 1 %
Eosinophils Absolute: 0.1 10*3/uL (ref 0.0–0.5)
Eosinophils Relative: 1 %
HCT: 44.9 % (ref 39.0–52.0)
Hemoglobin: 16.2 g/dL (ref 13.0–17.0)
Immature Granulocytes: 1 %
Lymphocytes Relative: 23 %
Lymphs Abs: 2.5 10*3/uL (ref 0.7–4.0)
MCH: 30.1 pg (ref 26.0–34.0)
MCHC: 36.1 g/dL — ABNORMAL HIGH (ref 30.0–36.0)
MCV: 83.5 fL (ref 80.0–100.0)
Monocytes Absolute: 0.6 10*3/uL (ref 0.1–1.0)
Monocytes Relative: 6 %
Neutro Abs: 7.4 10*3/uL (ref 1.7–7.7)
Neutrophils Relative %: 68 %
Platelets: 175 10*3/uL (ref 150–400)
RBC: 5.38 MIL/uL (ref 4.22–5.81)
RDW: 12.3 % (ref 11.5–15.5)
WBC: 10.6 10*3/uL — ABNORMAL HIGH (ref 4.0–10.5)
nRBC: 0 % (ref 0.0–0.2)

## 2022-02-25 LAB — COMPREHENSIVE METABOLIC PANEL
ALT: 23 U/L (ref 0–44)
AST: 16 U/L (ref 15–41)
Albumin: 4.8 g/dL (ref 3.5–5.0)
Alkaline Phosphatase: 120 U/L (ref 38–126)
Anion gap: 8 (ref 5–15)
BUN: 17 mg/dL (ref 6–20)
CO2: 28 mmol/L (ref 22–32)
Calcium: 9.5 mg/dL (ref 8.9–10.3)
Chloride: 102 mmol/L (ref 98–111)
Creatinine, Ser: 0.66 mg/dL (ref 0.61–1.24)
GFR, Estimated: >60 mL/min (ref >60)
Glucose, Bld: 258 mg/dL — ABNORMAL HIGH (ref 70–99)
Potassium: 3.5 mmol/L (ref 3.5–5.1)
Sodium: 138 mmol/L (ref 135–145)
Total Bilirubin: 0.7 mg/dL (ref 0.3–1.2)
Total Protein: 7.8 g/dL (ref 6.5–8.1)

## 2022-02-25 LAB — SEDIMENTATION RATE: Sed Rate: 5 mm/hr (ref 0–16)

## 2022-02-25 LAB — LACTIC ACID, PLASMA: Lactic Acid, Venous: 1.7 mmol/L (ref 0.5–1.9)

## 2022-02-25 LAB — PROTIME-INR
INR: 0.9 (ref 0.8–1.2)
Prothrombin Time: 12.4 seconds (ref 11.4–15.2)

## 2022-02-25 MED ORDER — SODIUM CHLORIDE 0.9 % IV SOLN: INTRAVENOUS | Status: AC

## 2022-02-25 MED ORDER — POTASSIUM CHLORIDE CRYS ER 20 MEQ PO TBCR
40.0000 meq | EXTENDED_RELEASE_TABLET | Freq: Once | ORAL | Status: AC
  Administered 2022-02-25: 40 meq via ORAL
  Filled 2022-02-25: qty 2

## 2022-02-25 MED ORDER — VANCOMYCIN HCL IN DEXTROSE 1-5 GM/200ML-% IV SOLN
1000.0000 mg | Freq: Once | INTRAVENOUS | Status: AC
  Administered 2022-02-25: 1000 mg via INTRAVENOUS
  Filled 2022-02-25: qty 200

## 2022-02-25 MED ORDER — SODIUM CHLORIDE 0.9 % IV SOLN
2.0000 g | INTRAVENOUS | Status: AC
  Administered 2022-02-25 – 2022-02-26 (×2): 2 g via INTRAVENOUS
  Filled 2022-02-25 (×2): qty 20

## 2022-02-25 NOTE — Assessment & Plan Note (Signed)
Glucose 258. - SSI- M -Hold home [place] he no longer takes metformin due to GI intolerance - HgbA1c

## 2022-02-25 NOTE — Assessment & Plan Note (Addendum)
Stable. -Resume lisinopril, Norvasc

## 2022-02-25 NOTE — Assessment & Plan Note (Addendum)
Cellulitis with osteomyelitis to right fifth toe.  Diabetic.  X-ray shows osteomyelitis of the fifth distal phalanx.  And swelling consistent with cellulitis.  Rules out for sepsis.  Initial heart rate of 110 now 80s, afebrile, WBC of 10.6.  Lactic acid 1.7. -Had similar infection to same foot 03/2021, treated with 6 weeks of IV antibiotics-at [facility]. -Admit to [facility] chat message sent to Dr. Sharol Given to see in consult, may need to reconsult in a.m. -IV vancomycin and ceftriaxone -Follow-up blood cultures obtained in ED -Obtain ESR, CRP - N/s 100cc/hr x 20hrs -N.p.o. midnight, pending Ortho eval

## 2022-02-25 NOTE — ED Triage Notes (Signed)
Pt has c/o right foot, 5th digit toe pain, toe red in color with scabbed over area, pt is diabetic, no relief from home pain meds.

## 2022-02-25 NOTE — Assessment & Plan Note (Signed)
Denies recent use of any drugs including cocaine, IV drugs alcohol or tobacco. -UDS

## 2022-02-25 NOTE — H&P (Addendum)
History and Physical    Albert Mcdonald:[id number] DOB: 01/26/1971 DOA: 02/25/2022  PCP: Leslie Andrea, MD   Patient coming from: home  I have personally briefly reviewed patient's old medical records in [employer]  Chief Complaint: Right fifth toe pain  HPI: Albert Mcdonald is a 51 y.o. male with medical history significant for diabetes mellitus, hypertension, substance abuse. Patient presented to ED with complaints of 2 days of redness and swelling of his right  5th  toe.  He reports pain from his foot going up towards to his thigh.  Denies fever or chills.  He otherwise denies any other complaints.  Currently denies any illicit drug use including cocaine, denies IV drug use.  Denies alcohol abuse.  Denies tobacco abuse.  ED Course: Temperature 98.5.  Heart rate 110.  Respirate rate 18.  Blood pressure systolic 956/213.  O2 sat 100% on room air.  WBC 10.6. X-ray of right foot showed osteomyelitis of the fifth distal phalanx, and cellulitis.  IV vancomycin hospitalist to admit.  Review of Systems: As per HPI all other systems reviewed and negative.  Past Medical History:  Diagnosis Date   ALS (amyotrophic lateral sclerosis) ([place])    patient denied on 11/01/12 -- has KLS not ALS   Diabetes mellitus    Family history of anesthesia complication    Hyperlipidemia    Hypertension    Kleine-Levin syndrome    OCD (obsessive compulsive disorder)    Restless leg     Past Surgical History:  Procedure Laterality Date   ANTERIOR CERVICAL DECOMP/DISCECTOMY FUSION  11/09/2012   Procedure: ANTERIOR CERVICAL DECOMPRESSION/DISCECTOMY FUSION 3 LEVELS C4-C7 ;  Surgeon: Melina Schools, MD;  Location: [facility];  Service: Orthopedics;;  ACDF C4-C7      reports that he has never smoked. He has never used smokeless tobacco. He reports current alcohol use. He reports that he does not use drugs.  Allergies  Allergen Reactions   Ace Inhibitors Rash    Family History of  hypertension.   Prior to Admission medications   Medication Sig Start Date End Date Taking? Authorizing Provider  amLODipine (NORVASC) 10 MG tablet Take 1 tablet (10 mg total) by mouth daily. 06/01/17  Yes Ena Dawley, Tiffany S, PA-C  atorvastatin (LIPITOR) 20 MG tablet Take 20 mg by mouth daily. 02/23/22  Yes [provider]  cyclobenzaprine (FLEXERIL) 10 MG tablet Take 10 mg by mouth 3 (three) times daily.   Yes [provider]  FARXIGA 10 MG TABS tablet Take 10 mg by mouth every morning. 02/23/22  Yes [provider]  lisinopril (ZESTRIL) 40 MG tablet Take 40 mg by mouth daily. 02/23/22  Yes [provider]  thiamine 100 MG tablet Take 1 tablet (100 mg total) by mouth daily. 03/17/17  Yes Amin, Jeanella Flattery, MD  metFORMIN (GLUCOPHAGE) 1000 MG tablet Take 1,000 mg by mouth 2 (two) times daily. Patient not taking: Reported on 02/25/2022 05/25/21   [provider]    Physical Exam: Vitals:   02/25/22 1805 02/25/22 1807  BP:  (!) 152/114  Pulse:  (!) 110  Resp:  18  Temp:  98.5 F (36.9 C)  TempSrc:  Oral  SpO2:  100%  Weight: 96.2 kg   Height: 6' 1"  (1.854 m)     Constitutional: NAD, calm, comfortable Vitals:   02/25/22 1805 02/25/22 1807  BP:  (!) 152/114  Pulse:  (!) 110  Resp:  18  Temp:  98.5 F (36.9 C)  TempSrc:  Oral  SpO2:  100%  Weight: 96.2 kg   Height: 6' 1"  (1.854 m)    Eyes: Pupils equal lids and conjunctivae normal ENMT: Mucous membranes are moist.  Neck: normal, supple, no masses, no thyromegaly Respiratory: clear to auscultation bilaterally, no wheezing, no crackles. Normal respiratory effort. No accessory muscle use.  Cardiovascular: Regular rate and rhythm, no murmurs / rubs / gallops. No extremity edema.  Abdomen: no tenderness, no masses palpated. No hepatosplenomegaly. Bowel sounds positive.  Musculoskeletal: no clubbing / cyanosis. No joint deformity upper and lower extremities.  Skin: Tenderness and  erythema to  right 5th toe,  No rashes, lesions, ulcers. No induration.  Also has callus to distal phalanx of toe of left foot, but without erythema. Neurologic: No apparent cranial nerve abnormality, moving extremities spontaneously.  Psychiatric: Normal judgment and insight. Alert and oriented x 3. Normal mood.      Labs on Admission: I have personally reviewed following labs and imaging studies  CBC: Recent Labs  Lab 02/25/22 2045  WBC 10.6*  NEUTROABS 7.4  HGB 16.2  HCT 44.9  MCV 83.5  PLT 151   Basic Metabolic Panel: Recent Labs  Lab 02/25/22 2045  NA 138  K 3.5  CL 102  CO2 28  GLUCOSE 258*  BUN 17  CREATININE 0.66  CALCIUM 9.5   GFR: Estimated Creatinine Clearance: 135 mL/min (by C-G formula based on SCr of 0.66 mg/dL). Liver Function Tests: Recent Labs  Lab 02/25/22 2045  AST 16  ALT 23  ALKPHOS 120  BILITOT 0.7  PROT 7.8  ALBUMIN 4.8   Coagulation Profile: Recent Labs  Lab 02/25/22 2045  INR 0.9    Radiological Exams on Admission: DG Foot Complete Right  Result Date: 02/25/2022 CLINICAL DATA:  Right fifth digit swelling, pain EXAM: RIGHT FOOT COMPLETE - 3+ VIEW COMPARISON:  06/01/2020 FINDINGS: Frontal, oblique, and lateral views of the right foot are obtained. There is marked soft tissue swelling of the fifth digit. Erosive changes seen along the plantar aspect of the fifth distal phalanx on lateral view, diagnostic of osteomyelitis. No other acute or destructive bony lesions. Joint spaces are relatively well preserved. IMPRESSION: 1. Osteomyelitis plantar aspect fifth distal phalanx. 2. Soft tissue swelling fifth digit consistent with cellulitis. Electronically Signed   By: Randa Ngo M.D.   On: 02/25/2022 18:32    EKG: None  Assessment/Plan Principal Problem:   Cellulitis Active Problems:   Type 2 diabetes mellitus ([facility])   Hypertension   Polysubstance abuse ([place])   Osteomyelitis ([facility])   Assessment and Plan: Osteomyelitis ([place]) Cellulitis with  osteomyelitis to right fifth toe.  Diabetic.  X-ray shows osteomyelitis of the fifth distal phalanx.  And swelling consistent with cellulitis.  Rules out for sepsis.  Initial heart rate of 110 now 80s, afebrile, WBC of 10.6.  Lactic acid 1.7. -Had similar infection to same foot 03/2021, treated with 6 weeks of IV antibiotics-at [facility]. -Admit to [facility] chat message sent to Dr. Sharol Given to see in consult, may need to reconsult in a.m. -IV vancomycin and ceftriaxone -Follow-up blood cultures obtained in ED -Obtain ESR, CRP - N/s 100cc/hr x 20hrs -N.p.o. midnight, pending Ortho eval  Polysubstance abuse ([place]) Denies recent use of any drugs including cocaine, IV drugs alcohol or tobacco. -UDS  Hypertension Stable. -Resume lisinopril, Norvasc  Type 2 diabetes mellitus ([facility]) Glucose 258. - SSI- M -Hold home [place] he no longer takes metformin due to GI intolerance - HgbA1c  DVT prophylaxis: Heparin Code Status: Full Family Communication: none at bedside Disposition Plan: > 2 days Consults called: None Admission status: Inpt Med surg I certify that at the point of admission it is my clinical judgment that the patient will require inpatient hospital care spanning beyond 2 midnights from the point of admission due to high intensity of service, high risk for further deterioration and high frequency of surveillance required.   Author: Bethena Roys, MD 02/25/2022 10:30 PM  For on call review [email or web address].

## 2022-02-25 NOTE — ED Provider Notes (Signed)
[facility] EMERGENCY DEPARTMENT Provider Note   CSN: [id number] Arrival date & time: 02/25/22  1733     History  Chief Complaint  Patient presents with   Foot Pain    Albert Mcdonald is a 51 y.o. male who presents the emergency department complaining of right fifth toe pain.  Patient is a known diabetic on metformin.  He states that he has been seeing redness and pain over this toe for the past 2 days.  States he has had similar symptoms in the past, requiring hospitalization and IV antibiotics.  No documented fever, but has had chills at home.   Foot Pain      Home Medications Prior to Admission medications   Medication Sig Start Date End Date Taking? Authorizing Provider  amLODipine (NORVASC) 10 MG tablet Take 1 tablet (10 mg total) by mouth daily. 06/01/17  Yes Ena Dawley, Tiffany S, PA-C  atorvastatin (LIPITOR) 20 MG tablet Take 20 mg by mouth daily. 02/23/22  Yes [provider]  cyclobenzaprine (FLEXERIL) 10 MG tablet Take 10 mg by mouth 3 (three) times daily.   Yes [provider]  FARXIGA 10 MG TABS tablet Take 10 mg by mouth every morning. 02/23/22  Yes [provider]  lisinopril (ZESTRIL) 40 MG tablet Take 40 mg by mouth daily. 02/23/22  Yes [provider]  thiamine 100 MG tablet Take 1 tablet (100 mg total) by mouth daily. 03/17/17  Yes Amin, Jeanella Flattery, MD  metFORMIN (GLUCOPHAGE) 1000 MG tablet Take 1,000 mg by mouth 2 (two) times daily. Patient not taking: Reported on 02/25/2022 05/25/21   [provider]      Allergies    Ace inhibitors    Review of Systems   Review of Systems  Constitutional:  Positive for chills. Negative for fever.  Skin:  Positive for color change and wound.  All other systems reviewed and are negative.  Physical Exam Updated Vital Signs BP (!) 140/108   Pulse 89   Temp 98.5 F (36.9 C) (Oral)   Resp 18   Ht '6\' 1"'$  (1.854 m)   Wt 96.2 kg   SpO2 97%   BMI 27.97 kg/m  Physical Exam Vitals and  nursing note reviewed.  Constitutional:      Appearance: Normal appearance.  HENT:     Head: Normocephalic and atraumatic.  Eyes:     Conjunctiva/sclera: Conjunctivae normal.  Pulmonary:     Effort: Pulmonary effort is normal. No respiratory distress.  Skin:    General: Skin is warm and dry.     Comments: Significant erythema and edema noted to the right fifth toe, with questionable ulcerated/scabbed area.  No significant trailing of erythema.  Focal tenderness of the digit with increased warmth.  Normal range of motion of all toes.  Neurological:     Mental Status: He is alert.  Psychiatric:        Mood and Affect: Mood normal.        Behavior: Behavior normal.    ED Results / Procedures / Treatments   Labs (all labs ordered are listed, but only abnormal results are displayed) Labs Reviewed  COMPREHENSIVE METABOLIC PANEL - Abnormal; Notable for the following components:      Result Value   Glucose, Bld 258 (*)    All other components within normal limits  CBC WITH DIFFERENTIAL/PLATELET - Abnormal; Notable for the following components:   WBC 10.6 (*)    MCHC 36.1 (*)    All other components within  normal limits  CULTURE, BLOOD (ROUTINE X 2)  CULTURE, BLOOD (ROUTINE X 2)  LACTIC ACID, PLASMA  PROTIME-INR  LACTIC ACID, PLASMA  HEMOGLOBIN A1C  SEDIMENTATION RATE  C-REACTIVE PROTEIN  RAPID URINE DRUG SCREEN, HOSP PERFORMED    EKG None  Radiology DG Foot Complete Right  Result Date: 02/25/2022 CLINICAL DATA:  Right fifth digit swelling, pain EXAM: RIGHT FOOT COMPLETE - 3+ VIEW COMPARISON:  06/01/2020 FINDINGS: Frontal, oblique, and lateral views of the right foot are obtained. There is marked soft tissue swelling of the fifth digit. Erosive changes seen along the plantar aspect of the fifth distal phalanx on lateral view, diagnostic of osteomyelitis. No other acute or destructive bony lesions. Joint spaces are relatively well preserved. IMPRESSION: 1. Osteomyelitis plantar  aspect fifth distal phalanx. 2. Soft tissue swelling fifth digit consistent with cellulitis. Electronically Signed   By: Randa Ngo M.D.   On: 02/25/2022 18:32    Procedures .Critical Care Performed by: Kateri Plummer, PA-C Authorized by: Kateri Plummer, PA-C   Critical care provider statement:    Critical care time (minutes):  30   Critical care time was exclusive of:  Separately billable procedures and treating other patients   Critical care was necessary to treat or prevent imminent or life-threatening deterioration of the following conditions:  Sepsis   Critical care was time spent personally by me on the following activities:  Development of treatment plan with patient or surrogate, discussions with consultants, evaluation of patient's response to treatment, examination of patient, ordering and review of laboratory studies, ordering and review of radiographic studies, ordering and performing treatments and interventions, pulse oximetry, re-evaluation of patient's condition and review of old charts    Medications Ordered in ED Medications  vancomycin (VANCOCIN) IVPB 1000 mg/200 mL premix (1,000 mg Intravenous New Bag/Given 02/25/22 2127)  potassium chloride SA (KLOR-CON M) CR tablet 40 mEq (has no administration in time range)  0.9 %  sodium chloride infusion (has no administration in time range)  cefTRIAXone (ROCEPHIN) 2 g in sodium chloride 0.9 % 100 mL IVPB (has no administration in time range)    ED Course/ Medical Decision Making/ A&P                           Medical Decision Making Amount and/or Complexity of Data Reviewed Labs: ordered. Radiology: ordered.  Risk Prescription drug management. Decision regarding hospitalization.  This patient is a 51 y.o. male  who presents to the ED for concern of right fifth toe pain.  History of diabetes.  Differential diagnoses prior to evaluation: The emergent differential diagnosis includes, but is not limited to,  cellulitis, osteomyelitis, diabetic foot infection, acute fracture dislocation.  This is not an exhaustive differential.   Past Medical History / Co-morbidities: OCD, diabetes on metformin, hyperlipidemia, hypertension  Additional history: Chart reviewed. Pertinent results include: Patient admitted to [facility] in January 2022 for phlegmon over upper thoracic spine as well as osteomyelitis of the right fifth toe.  He had a PICC line placed and had 6 weeks of IV Rocephin.  Was given insulin in the hospital due to noncompliance with his diabetes medication.  Physical Exam: Physical exam performed. The pertinent findings include: Edematous and erythematous right fifth toe.  Tachycardic to 110, afebrile.  Lab Tests/Imaging studies: I Ordered, and personally interpreted labs/imaging and the pertinent results include: No significant leukocytosis, normal hemoglobin.  Glucose 258, otherwise electrolytes within normal limits.  Lactic acid 1.7.  Right foot x-ray showed osteomyelitis of the plantar aspect of the fifth distal phalanx with soft tissue swelling.   I agree with the radiologist interpretation.   Medications: I ordered medication including empiric antibiotics for osteomyelitis.  I have reviewed the patients home medicines and have made adjustments as needed.  Consultations obtained: I consulted with hospitalist Dr. Denton Brick who will admit   Disposition: After consideration of the diagnostic results and the patients response to treatment, I feel that patient's requiring admission for IV antibiotics and possible surgical consultation.   Final Clinical Impression(s) / ED Diagnoses Final diagnoses:  Osteomyelitis of fifth toe of right foot ([place])    Rx / DC Orders ED Discharge Orders     None      Portions of this report may have been transcribed using voice recognition software. Every effort was made to ensure accuracy; however, inadvertent computerized transcription errors may be  present.    Estill Cotta 02/25/22 2219    Daleen Bo, MD 02/26/22 [phone]

## 2022-02-26 ENCOUNTER — Encounter (HOSPITAL_COMMUNITY): Payer: Self-pay | Admitting: Internal Medicine

## 2022-02-26 ENCOUNTER — Inpatient Hospital Stay (HOSPITAL_COMMUNITY): Payer: Medicare Other | Admitting: Certified Registered"

## 2022-02-26 ENCOUNTER — Encounter (HOSPITAL_COMMUNITY)
Admission: EM | Disposition: A | Discharge status: Home / Self Care | Payer: Self-pay | Source: Home / Self Care | Attending: Internal Medicine

## 2022-02-26 ENCOUNTER — Other Ambulatory Visit: Payer: Self-pay

## 2022-02-26 DIAGNOSIS — E1165 Type 2 diabetes mellitus with hyperglycemia: Secondary | ICD-10-CM

## 2022-02-26 DIAGNOSIS — L03031 Cellulitis of right toe: Secondary | ICD-10-CM | POA: Diagnosis not present

## 2022-02-26 DIAGNOSIS — E1169 Type 2 diabetes mellitus with other specified complication: Secondary | ICD-10-CM

## 2022-02-26 DIAGNOSIS — M869 Osteomyelitis, unspecified: Secondary | ICD-10-CM | POA: Diagnosis not present

## 2022-02-26 DIAGNOSIS — Z7984 Long term (current) use of oral hypoglycemic drugs: Secondary | ICD-10-CM

## 2022-02-26 HISTORY — PX: AMPUTATION: SHX166

## 2022-02-26 LAB — HEMOGLOBIN A1C
Hgb A1c MFr Bld: 11.8 % — ABNORMAL HIGH (ref 4.8–5.6)
Mean Plasma Glucose: 291.96 mg/dL

## 2022-02-26 LAB — BASIC METABOLIC PANEL
Anion gap: 8 (ref 5–15)
BUN: 13 mg/dL (ref 6–20)
CO2: 26 mmol/L (ref 22–32)
Calcium: 9.2 mg/dL (ref 8.9–10.3)
Chloride: 104 mmol/L (ref 98–111)
Creatinine, Ser: 0.72 mg/dL (ref 0.61–1.24)
GFR, Estimated: >60 mL/min (ref >60)
Glucose, Bld: 246 mg/dL — ABNORMAL HIGH (ref 70–99)
Potassium: 3.8 mmol/L (ref 3.5–5.1)
Sodium: 138 mmol/L (ref 135–145)

## 2022-02-26 LAB — CBC
HCT: 44 % (ref 39.0–52.0)
Hemoglobin: 15.4 g/dL (ref 13.0–17.0)
MCH: 29.4 pg (ref 26.0–34.0)
MCHC: 35 g/dL (ref 30.0–36.0)
MCV: 84 fL (ref 80.0–100.0)
Platelets: 149 10*3/uL — ABNORMAL LOW (ref 150–400)
RBC: 5.24 MIL/uL (ref 4.22–5.81)
RDW: 12.1 % (ref 11.5–15.5)
WBC: 8 10*3/uL (ref 4.0–10.5)
nRBC: 0 % (ref 0.0–0.2)

## 2022-02-26 LAB — GLUCOSE, CAPILLARY
Glucose-Capillary: 147 mg/dL — ABNORMAL HIGH (ref 70–99)
Glucose-Capillary: 158 mg/dL — ABNORMAL HIGH (ref 70–99)
Glucose-Capillary: 190 mg/dL — ABNORMAL HIGH (ref 70–99)
Glucose-Capillary: 249 mg/dL — ABNORMAL HIGH (ref 70–99)
Glucose-Capillary: 343 mg/dL — ABNORMAL HIGH (ref 70–99)
Glucose-Capillary: 400 mg/dL — ABNORMAL HIGH (ref 70–99)
Glucose-Capillary: 430 mg/dL — ABNORMAL HIGH (ref 70–99)

## 2022-02-26 LAB — RAPID URINE DRUG SCREEN, HOSP PERFORMED
Amphetamines: NOT DETECTED
Barbiturates: NOT DETECTED
Benzodiazepines: NOT DETECTED
Cocaine: NOT DETECTED
Opiates: NOT DETECTED
Tetrahydrocannabinol: NOT DETECTED

## 2022-02-26 LAB — C-REACTIVE PROTEIN: CRP: 1.6 mg/dL — ABNORMAL HIGH (ref <1.0)

## 2022-02-26 LAB — HIV ANTIBODY (ROUTINE TESTING W REFLEX): HIV Screen 4th Generation wRfx: NONREACTIVE

## 2022-02-26 SURGERY — AMPUTATION DIGIT
Anesthesia: General | Laterality: Right

## 2022-02-26 MED ORDER — DEXAMETHASONE SODIUM PHOSPHATE 10 MG/ML IJ SOLN
INTRAMUSCULAR | Status: DC | PRN
  Administered 2022-02-26: 10 mg via INTRAVENOUS

## 2022-02-26 MED ORDER — OXYCODONE HCL 5 MG/5ML PO SOLN: 5.0000 mg | Freq: Once | ORAL | Status: DC | PRN

## 2022-02-26 MED ORDER — MIDAZOLAM HCL 2 MG/2ML IJ SOLN
INTRAMUSCULAR | Status: DC | PRN
  Administered 2022-02-26: 2 mg via INTRAVENOUS

## 2022-02-26 MED ORDER — VANCOMYCIN HCL 1750 MG/350ML IV SOLN
1750.0000 mg | Freq: Two times a day (BID) | INTRAVENOUS | Status: AC
  Administered 2022-02-26 – 2022-02-27 (×3): 1750 mg via INTRAVENOUS
  Filled 2022-02-26 (×3): qty 350

## 2022-02-26 MED ORDER — AMLODIPINE BESYLATE 10 MG PO TABS
10.0000 mg | ORAL_TABLET | Freq: Every day | ORAL | Status: DC
  Administered 2022-02-27: 10 mg via ORAL
  Filled 2022-02-26: qty 1

## 2022-02-26 MED ORDER — GUAIFENESIN-DM 100-10 MG/5ML PO SYRP: 15.0000 mL | ORAL_SOLUTION | ORAL | Status: DC | PRN

## 2022-02-26 MED ORDER — LIDOCAINE 2% (20 MG/ML) 5 ML SYRINGE
INTRAMUSCULAR | Status: DC | PRN
  Administered 2022-02-26: 80 mg via INTRAVENOUS

## 2022-02-26 MED ORDER — FENTANYL CITRATE (PF) 250 MCG/5ML IJ SOLN
INTRAMUSCULAR | Status: DC | PRN
  Administered 2022-02-26: 50 ug via INTRAVENOUS

## 2022-02-26 MED ORDER — POLYETHYLENE GLYCOL 3350 17 G PO PACK: 17.0000 g | PACK | Freq: Every day | ORAL | Status: DC | PRN

## 2022-02-26 MED ORDER — LACTATED RINGERS IV SOLN: INTRAVENOUS | Status: DC

## 2022-02-26 MED ORDER — PROPOFOL 10 MG/ML IV BOLUS
INTRAVENOUS | Status: AC
  Filled 2022-02-26: qty 20

## 2022-02-26 MED ORDER — MAGNESIUM SULFATE 2 GM/50ML IV SOLN: 2.0000 g | Freq: Every day | INTRAVENOUS | Status: DC | PRN

## 2022-02-26 MED ORDER — ASCORBIC ACID 500 MG PO TABS
1000.0000 mg | ORAL_TABLET | Freq: Every day | ORAL | Status: DC
  Administered 2022-02-26 – 2022-02-27 (×2): 1000 mg via ORAL
  Filled 2022-02-26 (×2): qty 2

## 2022-02-26 MED ORDER — CHLORHEXIDINE GLUCONATE 0.12 % MT SOLN
OROMUCOSAL | Status: AC
  Administered 2022-02-26: 15 mL via OROMUCOSAL
  Filled 2022-02-26: qty 15

## 2022-02-26 MED ORDER — ONDANSETRON HCL 4 MG/2ML IJ SOLN
INTRAMUSCULAR | Status: AC
  Filled 2022-02-26: qty 6

## 2022-02-26 MED ORDER — ONDANSETRON HCL 4 MG/2ML IJ SOLN
INTRAMUSCULAR | Status: DC | PRN
  Administered 2022-02-26: 4 mg via INTRAVENOUS

## 2022-02-26 MED ORDER — POTASSIUM CHLORIDE CRYS ER 20 MEQ PO TBCR: 20.0000 meq | EXTENDED_RELEASE_TABLET | Freq: Every day | ORAL | Status: DC | PRN

## 2022-02-26 MED ORDER — SODIUM CHLORIDE 0.9 % IV SOLN: INTRAVENOUS | Status: DC

## 2022-02-26 MED ORDER — INSULIN ASPART 100 UNIT/ML IJ SOLN
15.0000 [IU] | Freq: Once | INTRAMUSCULAR | Status: AC
  Administered 2022-02-26: 15 [IU] via SUBCUTANEOUS

## 2022-02-26 MED ORDER — INSULIN ASPART 100 UNIT/ML IJ SOLN
INTRAMUSCULAR | Status: AC
  Administered 2022-02-26: 2 [IU] via SUBCUTANEOUS
  Filled 2022-02-26: qty 1

## 2022-02-26 MED ORDER — ACETAMINOPHEN 650 MG RE SUPP: 650.0000 mg | Freq: Four times a day (QID) | RECTAL | Status: DC | PRN

## 2022-02-26 MED ORDER — MIDAZOLAM HCL 2 MG/2ML IJ SOLN
INTRAMUSCULAR | Status: AC
  Filled 2022-02-26: qty 2

## 2022-02-26 MED ORDER — ATORVASTATIN CALCIUM 10 MG PO TABS
20.0000 mg | ORAL_TABLET | Freq: Every day | ORAL | Status: DC
  Administered 2022-02-27: 20 mg via ORAL
  Filled 2022-02-26: qty 2

## 2022-02-26 MED ORDER — PHENOL 1.4 % MT LIQD
1.0000 | OROMUCOSAL | Status: DC | PRN
Start: 2022-02-26 — End: 2022-02-27

## 2022-02-26 MED ORDER — BISACODYL 5 MG PO TBEC: 5.0000 mg | DELAYED_RELEASE_TABLET | Freq: Every day | ORAL | Status: DC | PRN

## 2022-02-26 MED ORDER — LISINOPRIL 20 MG PO TABS
40.0000 mg | ORAL_TABLET | Freq: Every day | ORAL | Status: DC
  Administered 2022-02-27: 40 mg via ORAL
  Filled 2022-02-26: qty 2

## 2022-02-26 MED ORDER — PANTOPRAZOLE SODIUM 40 MG PO TBEC
40.0000 mg | DELAYED_RELEASE_TABLET | Freq: Every day | ORAL | Status: DC
  Administered 2022-02-27: 40 mg via ORAL
  Filled 2022-02-26: qty 1

## 2022-02-26 MED ORDER — INSULIN ASPART 100 UNIT/ML IJ SOLN
0.0000 [IU] | Freq: Four times a day (QID) | INTRAMUSCULAR | Status: DC
  Administered 2022-02-26: 5 [IU] via SUBCUTANEOUS

## 2022-02-26 MED ORDER — ONDANSETRON HCL 4 MG PO TABS: 4.0000 mg | ORAL_TABLET | Freq: Four times a day (QID) | ORAL | Status: DC | PRN

## 2022-02-26 MED ORDER — ACETAMINOPHEN 325 MG PO TABS: 325.0000 mg | ORAL_TABLET | Freq: Four times a day (QID) | ORAL | Status: DC | PRN

## 2022-02-26 MED ORDER — OXYCODONE HCL 5 MG PO TABS: 5.0000 mg | ORAL_TABLET | Freq: Once | ORAL | Status: DC | PRN

## 2022-02-26 MED ORDER — METOPROLOL TARTRATE 5 MG/5ML IV SOLN: 2.0000 mg | INTRAVENOUS | Status: DC | PRN

## 2022-02-26 MED ORDER — HYDROCODONE-ACETAMINOPHEN 5-325 MG PO TABS
1.0000 | ORAL_TABLET | ORAL | Status: DC | PRN
  Administered 2022-02-26: 2 via ORAL
  Filled 2022-02-26: qty 2

## 2022-02-26 MED ORDER — ORAL CARE MOUTH RINSE: 15.0000 mL | Freq: Once | OROMUCOSAL | Status: AC

## 2022-02-26 MED ORDER — INSULIN ASPART 100 UNIT/ML IJ SOLN: 0.0000 [IU] | INTRAMUSCULAR | Status: DC | PRN

## 2022-02-26 MED ORDER — PROPOFOL 10 MG/ML IV BOLUS
INTRAVENOUS | Status: DC | PRN
  Administered 2022-02-26: 200 mg via INTRAVENOUS

## 2022-02-26 MED ORDER — IBUPROFEN 600 MG PO TABS: 600.0000 mg | ORAL_TABLET | Freq: Three times a day (TID) | ORAL | Status: DC | PRN

## 2022-02-26 MED ORDER — MORPHINE SULFATE (PF) 2 MG/ML IV SOLN: 0.5000 mg | INTRAVENOUS | Status: DC | PRN

## 2022-02-26 MED ORDER — LIDOCAINE 2% (20 MG/ML) 5 ML SYRINGE
INTRAMUSCULAR | Status: AC
  Filled 2022-02-26: qty 15

## 2022-02-26 MED ORDER — ONDANSETRON HCL 4 MG/2ML IJ SOLN: 4.0000 mg | Freq: Once | INTRAMUSCULAR | Status: DC | PRN

## 2022-02-26 MED ORDER — DOCUSATE SODIUM 100 MG PO CAPS
100.0000 mg | ORAL_CAPSULE | Freq: Every day | ORAL | Status: DC
  Administered 2022-02-27: 100 mg via ORAL
  Filled 2022-02-26: qty 1

## 2022-02-26 MED ORDER — DEXAMETHASONE SODIUM PHOSPHATE 10 MG/ML IJ SOLN
INTRAMUSCULAR | Status: AC
  Filled 2022-02-26: qty 3

## 2022-02-26 MED ORDER — LABETALOL HCL 5 MG/ML IV SOLN: 10.0000 mg | INTRAVENOUS | Status: DC | PRN

## 2022-02-26 MED ORDER — ZINC SULFATE 220 (50 ZN) MG PO CAPS
220.0000 mg | ORAL_CAPSULE | Freq: Every day | ORAL | Status: DC
  Administered 2022-02-26 – 2022-02-27 (×2): 220 mg via ORAL
  Filled 2022-02-26 (×2): qty 1

## 2022-02-26 MED ORDER — ONDANSETRON HCL 4 MG/2ML IJ SOLN
4.0000 mg | Freq: Four times a day (QID) | INTRAMUSCULAR | Status: DC | PRN
Start: 2022-02-26 — End: 2022-02-26

## 2022-02-26 MED ORDER — ALUM & MAG HYDROXIDE-SIMETH 200-200-20 MG/5ML PO SUSP: 15.0000 mL | ORAL | Status: DC | PRN

## 2022-02-26 MED ORDER — INSULIN ASPART 100 UNIT/ML IJ SOLN
0.0000 [IU] | Freq: Three times a day (TID) | INTRAMUSCULAR | Status: DC
  Administered 2022-02-26: 11 [IU] via SUBCUTANEOUS
  Administered 2022-02-27 (×2): 3 [IU] via SUBCUTANEOUS

## 2022-02-26 MED ORDER — HYDROCODONE-ACETAMINOPHEN 7.5-325 MG PO TABS: 1.0000 | ORAL_TABLET | ORAL | Status: DC | PRN

## 2022-02-26 MED ORDER — CHLORHEXIDINE GLUCONATE 0.12 % MT SOLN: 15.0000 mL | Freq: Once | OROMUCOSAL | Status: AC

## 2022-02-26 MED ORDER — MAGNESIUM CITRATE PO SOLN: 1.0000 | Freq: Once | ORAL | Status: DC | PRN

## 2022-02-26 MED ORDER — HEPARIN SODIUM (PORCINE) 5000 UNIT/ML IJ SOLN
5000.0000 [IU] | Freq: Three times a day (TID) | INTRAMUSCULAR | Status: DC
  Administered 2022-02-26 – 2022-02-27 (×2): 5000 [IU] via SUBCUTANEOUS
  Filled 2022-02-26 (×3): qty 1

## 2022-02-26 MED ORDER — HYDRALAZINE HCL 20 MG/ML IJ SOLN: 5.0000 mg | INTRAMUSCULAR | Status: DC | PRN

## 2022-02-26 MED ORDER — ONDANSETRON HCL 4 MG/2ML IJ SOLN: 4.0000 mg | Freq: Four times a day (QID) | INTRAMUSCULAR | Status: DC | PRN

## 2022-02-26 MED ORDER — FENTANYL CITRATE (PF) 250 MCG/5ML IJ SOLN
INTRAMUSCULAR | Status: AC
  Filled 2022-02-26: qty 5

## 2022-02-26 MED ORDER — PROPOFOL 500 MG/50ML IV EMUL
INTRAVENOUS | Status: DC | PRN
  Administered 2022-02-26: 150 ug/kg/min via INTRAVENOUS

## 2022-02-26 MED ORDER — HYDROMORPHONE HCL 1 MG/ML IJ SOLN: 0.2500 mg | INTRAMUSCULAR | Status: DC | PRN

## 2022-02-26 MED ORDER — ACETAMINOPHEN 325 MG PO TABS: 650.0000 mg | ORAL_TABLET | Freq: Four times a day (QID) | ORAL | Status: DC | PRN

## 2022-02-26 MED ORDER — JUVEN PO PACK
1.0000 | PACK | Freq: Two times a day (BID) | ORAL | Status: DC
  Administered 2022-02-26 – 2022-02-27 (×2): 1 via ORAL
  Filled 2022-02-26 (×2): qty 1

## 2022-02-26 MED ORDER — PHENYLEPHRINE HCL-NACL 20-0.9 MG/250ML-% IV SOLN: INTRAVENOUS | Status: DC | PRN

## 2022-02-26 MED ORDER — 0.9 % SODIUM CHLORIDE (POUR BTL) OPTIME
TOPICAL | Status: DC | PRN
  Administered 2022-02-26: 1000 mL

## 2022-02-26 SURGICAL SUPPLY — 31 items
BAG COUNTER SPONGE SURGICOUNT (BAG) ×2 IMPLANT
BAG SPNG CNTER NS LX DISP (BAG) ×1
BLADE SURG 21 STRL SS (BLADE) ×2 IMPLANT
BNDG CMPR 9X4 STRL LF SNTH (GAUZE/BANDAGES/DRESSINGS)
BNDG COHESIVE 4X5 TAN STRL (GAUZE/BANDAGES/DRESSINGS) ×2 IMPLANT
BNDG COHESIVE 6X5 TAN NS LF (GAUZE/BANDAGES/DRESSINGS) ×2 IMPLANT
BNDG ESMARK 4X9 LF (GAUZE/BANDAGES/DRESSINGS) IMPLANT
BNDG GAUZE ELAST 4 BULKY (GAUZE/BANDAGES/DRESSINGS) ×4 IMPLANT
COVER SURGICAL LIGHT HANDLE (MISCELLANEOUS) ×4 IMPLANT
DRAPE U-SHAPE 47X51 STRL (DRAPES) ×2 IMPLANT
DRSG ADAPTIC 3X8 NADH LF (GAUZE/BANDAGES/DRESSINGS) IMPLANT
DRSG PAD ABDOMINAL 8X10 ST (GAUZE/BANDAGES/DRESSINGS) ×2 IMPLANT
DURAPREP 26ML APPLICATOR (WOUND CARE) ×2 IMPLANT
ELECT REM PT RETURN 9FT ADLT (ELECTROSURGICAL) ×2
ELECTRODE REM PT RTRN 9FT ADLT (ELECTROSURGICAL) ×1 IMPLANT
GAUZE SPONGE 4X4 12PLY STRL (GAUZE/BANDAGES/DRESSINGS) ×2 IMPLANT
GLOVE BIOGEL PI IND STRL 9 (GLOVE) ×1 IMPLANT
GLOVE BIOGEL PI INDICATOR 9 (GLOVE) ×1
GLOVE SURG ORTHO 9.0 STRL STRW (GLOVE) ×2 IMPLANT
GOWN STRL REUS W/ TWL XL LVL3 (GOWN DISPOSABLE) ×2 IMPLANT
GOWN STRL REUS W/TWL XL LVL3 (GOWN DISPOSABLE) ×4
KIT BASIN OR (CUSTOM PROCEDURE TRAY) ×2 IMPLANT
KIT TURNOVER KIT B (KITS) ×2 IMPLANT
MANIFOLD NEPTUNE II (INSTRUMENTS) ×2 IMPLANT
NEEDLE 22X1 1/2 (OR ONLY) (NEEDLE) IMPLANT
NS IRRIG 1000ML POUR BTL (IV SOLUTION) ×2 IMPLANT
PACK ORTHO EXTREMITY (CUSTOM PROCEDURE TRAY) ×2 IMPLANT
PAD ARMBOARD 7.5X6 YLW CONV (MISCELLANEOUS) ×4 IMPLANT
SUT ETHILON 2 0 PSLX (SUTURE) ×2 IMPLANT
SYR CONTROL 10ML LL (SYRINGE) IMPLANT
TOWEL GREEN STERILE (TOWEL DISPOSABLE) ×2 IMPLANT

## 2022-02-26 NOTE — Transfer of Care (Signed)
Immediate Anesthesia Transfer of Care Note  Patient: Albert Mcdonald  Procedure(s) Performed: AMPUTATION 5th TOE (Right)  Patient Location: PACU  Anesthesia Type:General  Level of Consciousness: drowsy  Airway & Oxygen Therapy: Patient Spontanous Breathing and Patient connected to face mask oxygen  Post-op Assessment: Report given to RN and Post -op Vital signs reviewed and stable  Post vital signs: Reviewed and stable  Last Vitals:  Vitals Value Taken Time  BP 97/72 02/26/22 1323  Temp    Pulse 76 02/26/22 1327  Resp 9 02/26/22 1327  SpO2 100 % 02/26/22 1327  Vitals shown include unvalidated device data.  Last Pain:  Vitals:   02/26/22 1206  TempSrc:   PainSc: 0-No pain         Complications: No notable events documented.

## 2022-02-26 NOTE — Anesthesia Preprocedure Evaluation (Signed)
Anesthesia Evaluation  Patient identified by MRN, date of birth, ID band Patient awake    Reviewed: Allergy & Precautions, NPO status , Patient's Chart, lab work & pertinent test results  History of Anesthesia Complications (+) Family history of anesthesia reaction  Airway Mallampati: II  TM Distance: >3 FB Neck ROM: Full    Dental  (+) Edentulous Lower, Edentulous Upper   Pulmonary    Pulmonary exam normal breath sounds clear to auscultation       Cardiovascular hypertension, Pt. on medications Normal cardiovascular exam Rhythm:Regular Rate:Normal     Neuro/Psych PSYCHIATRIC DISORDERS Anxiety Depression negative neurological ROS     GI/Hepatic negative GI ROS, Neg liver ROS,   Endo/Other  diabetes, Poorly Controlled, Type 2, Oral Hypoglycemic AgentsHyperlipidemia  Renal/GU negative Renal ROS  negative genitourinary   Musculoskeletal  (+) Arthritis , Osteoarthritis,  Osteomyelitis right 5th toe   Abdominal   Peds  Hematology negative hematology ROS (+)   Anesthesia Other Findings   Reproductive/Obstetrics                             Anesthesia Physical Anesthesia Plan  ASA: 3  Anesthesia Plan: General   Post-op Pain Management:    Induction: Intravenous  PONV Risk Score and Plan: 3 and Treatment may vary due to age or medical condition, Ondansetron, TIVA and Midazolam  Airway Management Planned: LMA  Additional Equipment: None  Intra-op Plan:   Post-operative Plan: Extubation in OR  Informed Consent: I have reviewed the patients History and Physical, chart, labs and discussed the procedure including the risks, benefits and alternatives for the proposed anesthesia with the patient or authorized representative who has indicated his/her understanding and acceptance.     Dental advisory given  Plan Discussed with: Anesthesiologist and CRNA  Anesthesia Plan Comments:          Anesthesia Quick Evaluation

## 2022-02-26 NOTE — Op Note (Signed)
02/26/2022  1:26 PM  PATIENT:  Albert Mcdonald    PRE-OPERATIVE DIAGNOSIS:  Osteomyelitis right fifth toe  POST-OPERATIVE DIAGNOSIS:  Same  PROCEDURE:  AMPUTATION 5th Ray  SURGEON:  [name], MD  PHYSICIAN ASSISTANT:None ANESTHESIA:   General  PREOPERATIVE INDICATIONS:  [name] is a  51 y.o. male with a diagnosis of Osteomyelitis who failed conservative measures and elected for surgical management.    The risks benefits and alternatives were discussed with the patient preoperatively including but not limited to the risks of infection, bleeding, nerve injury, cardiopulmonary complications, the need for revision surgery, among others, and the patient was willing to proceed.  OPERATIVE IMPLANTS: none  '@ENCIMAGES'$ @  OPERATIVE FINDINGS: Good petechial bleeding, tissue margins were clear.  OPERATIVE PROCEDURE: Patient was brought to the operating room and underwent a general anesthetic.  After adequate levels anesthesia obtained patient's right lower extremity was prepped using DuraPrep draped into a sterile field a timeout was called.  A racquet incision was made around the infected toe and the amputation was performed through the metatarsal neck.  The tissue margins were clear electrocautery was used hemostasis the wound was irrigated with normal saline incision closed using 2-0 nylon.  Sterile dressing was applied patient was taken the PACU in stable condition.   DISCHARGE PLANNING:  Antibiotic duration: Continue antibiotics for 24 hours  Weightbearing: Ideally nonweightbearing on the right but may be partial weightbearing for safety  Pain medication: Opioid pathway  Dressing care/ Wound VAC: Dry dressing change in the office and follow-up  Ambulatory devices: Walker or crutches  Discharge to: Anticipate discharge to home in safe with therapy.  I will follow-up in the office in 1 week.  Follow-up: In the office 1 week post operative.

## 2022-02-26 NOTE — Evaluation (Signed)
Physical Therapy Evaluation Patient Details Name: Albert Mcdonald MRN: [id number] DOB: April 30, 1971 Today's Date: 02/26/2022  History of Present Illness  51 y/o male presented to ED on 02/25/22 for 5th digit pain and redness. Imaging shows destructive bony changes of R 5th digit with chronic osteomyelitis. S/p R 5th ray amputation on 6/2. PMH: HTN, T2DM, OCD, ALS, polysubstance abuse  Clinical Impression  Patient admitted with the above and underwent above procedure. PTA, patient lives with parents and reports independence with all mobility. Patient currently limited by functional deficits listed below. Educated patient on NWB and impact on mobility, patient verbalized understanding but required cues to maintain NWB during session. Overall at supervision level for OOB mobility with use of RW. Patient prefers use of knee scooter which he has at home and has used in the past. Patient will benefit from skilled PT services during acute stay to address listed deficits. No PT follow up recommended at this time.        Recommendations for follow up therapy are one component of a multi-disciplinary discharge planning process, led by the attending physician.  Recommendations may be updated based on patient status, additional functional criteria and insurance authorization.  Follow Up Recommendations No PT follow up    Assistance Recommended at Discharge Intermittent Supervision/Assistance  Patient can return home with the following       Equipment Recommendations None recommended by PT (patient states he owns necessary equipment)  Recommendations for Other Services       Functional Status Assessment Patient has had a recent decline in their functional status and demonstrates the ability to make significant improvements in function in a reasonable and predictable amount of time.     Precautions / Restrictions Precautions Precautions: Fall Restrictions Weight Bearing Restrictions: Yes RLE Weight  Bearing: Non weight bearing      Mobility  Bed Mobility Overal bed mobility: Modified Independent                  Transfers Overall transfer level: Needs assistance Equipment used: Rolling Yeraldin Litzenberger (2 wheels) Transfers: Sit to/from Stand Sit to Stand: Supervision           General transfer comment: cues for maintaining NWB on R when coming to stand    Ambulation/Gait Ambulation/Gait assistance: Supervision Gait Distance (Feet): 50 Feet Assistive device: Rolling Leno Mathes (2 wheels) Gait Pattern/deviations:  (hop to) Gait velocity: decreased     General Gait Details: impulsive behaviors with quick movements. Cues for slower pace to prevent potential fall as he is at high risk for falls. Cues for NWB but doing well maintaining NWB. Supervision for safety  Stairs            Wheelchair Mobility    Modified Rankin (Stroke Patients Only)       Balance Overall balance assessment: Needs assistance Sitting-balance support: No upper extremity supported, Feet supported Sitting balance-Leahy Scale: Normal     Standing balance support: Bilateral upper extremity supported, Reliant on assistive device for balance Standing balance-Leahy Scale: Poor Standing balance comment: reliant on B UE support                             Pertinent Vitals/Pain Pain Assessment Pain Assessment: No/denies pain    Home Living Family/patient expects to be discharged to:: Private residence Living Arrangements: Parent Available Help at Discharge: Family Type of Home: House Home Access: Level entry       Home Layout: One  level Home Equipment: Conservation officer, nature (2 wheels);Crutches;BSC/3in1;Tub bench;Other (comment) (knee scooter)      Prior Function Prior Level of Function : Independent/Modified Independent                     Hand Dominance        Extremity/Trunk Assessment   Upper Extremity Assessment Upper Extremity Assessment: Overall WFL for tasks  assessed    Lower Extremity Assessment Lower Extremity Assessment: RLE deficits/detail RLE Deficits / Details: overall WFL. Ankle/foot difficult to assess due to immobilization RLE: Unable to fully assess due to immobilization    Cervical / Trunk Assessment Cervical / Trunk Assessment: Normal  Communication   Communication: No difficulties  Cognition Arousal/Alertness: Awake/alert Behavior During Therapy: Impulsive Overall Cognitive Status: Within Functional Limits for tasks assessed                                          General Comments      Exercises     Assessment/Plan    PT Assessment Patient needs continued PT services  PT Problem List Decreased activity tolerance;Decreased strength;Decreased balance;Decreased mobility;Decreased safety awareness;Decreased knowledge of precautions       PT Treatment Interventions DME instruction;Gait training;Therapeutic activities;Therapeutic exercise;Functional mobility training;Balance training;Patient/family education    PT Goals (Current goals can be found in the Care Plan section)  Acute Rehab PT Goals Patient Stated Goal: to go home PT Goal Formulation: With patient Time For Goal Achievement: 03/12/22 Potential to Achieve Goals: Good    Frequency Min 3X/week     Co-evaluation               AM-PAC PT "6 Clicks" Mobility  Outcome Measure Help needed turning from your back to your side while in a flat bed without using bedrails?: None Help needed moving from lying on your back to sitting on the side of a flat bed without using bedrails?: None Help needed moving to and from a bed to a chair (including a wheelchair)?: A Little Help needed standing up from a chair using your arms (e.g., wheelchair or bedside chair)?: A Little Help needed to walk in hospital room?: A Little Help needed climbing 3-5 steps with a railing? : A Lot 6 Click Score: 19    End of Session   Activity Tolerance: Patient  tolerated treatment well Patient left: in bed;with call bell/phone within reach Nurse Communication: Mobility status PT Visit Diagnosis: Unsteadiness on feet (R26.81);Muscle weakness (generalized) (M62.81);Other abnormalities of gait and mobility (R26.89)    Time: 1551-1601 PT Time Calculation (min) (ACUTE ONLY): 10 min   Charges:   PT Evaluation $PT Eval Low Complexity: 1 Low          Shuntae Herzig A. Gilford Rile PT, DPT Acute Rehabilitation Services Pager [phone] Office [phone]   Linna Hoff 02/26/2022, 5:10 PM

## 2022-02-26 NOTE — Consult Note (Signed)
ORTHOPAEDIC CONSULTATION  REQUESTING PHYSICIAN: Shelly Coss, MD  Chief Complaint: Swelling and cellulitis right little toe.  HPI: Albert Mcdonald is a 51 y.o. male who presents with uncontrolled type 2 diabetes.  Patient states he previously was septic with an infection in the right foot.  He feels like the recurrent infection in the little toe is from the initial infection.  Past Medical History:  Diagnosis Date   ALS (amyotrophic lateral sclerosis) ([place])    patient denied on 11/01/12 -- has KLS not ALS   Diabetes mellitus    Family history of anesthesia complication    Hyperlipidemia    Hypertension    Kleine-Levin syndrome    OCD (obsessive compulsive disorder)    Restless leg    Past Surgical History:  Procedure Laterality Date   ANTERIOR CERVICAL DECOMP/DISCECTOMY FUSION  11/09/2012   Procedure: ANTERIOR CERVICAL DECOMPRESSION/DISCECTOMY FUSION 3 LEVELS C4-C7 ;  Surgeon: Melina Schools, MD;  Location: [facility] OR;  Service: Orthopedics;;  ACDF C4-C7    Social History   Socioeconomic History   Marital status: Single    Spouse name: Not on file   Number of children: Not on file   Years of education: Not on file   Highest education level: Not on file  Occupational History   Not on file  Tobacco Use   Smoking status: Never   Smokeless tobacco: Never  Vaping Use   Vaping Use: Never used  Substance and Sexual Activity   Alcohol use: Yes   Drug use: No   Sexual activity: Not on file  Other Topics Concern   Not on file  Social History Narrative   Not on file   Social Determinants of Health   Financial Resource Strain: Not on file  Food Insecurity: Not on file  Transportation Needs: Not on file  Physical Activity: Not on file  Stress: Not on file  Social Connections: Not on file   History reviewed. No pertinent family history. - negative except otherwise stated in the family history section Allergies  Allergen Reactions   Ace Inhibitors Rash   Prior to  Admission medications   Medication Sig Start Date End Date Taking? Authorizing Provider  amLODipine (NORVASC) 10 MG tablet Take 1 tablet (10 mg total) by mouth daily. 06/01/17  Yes Ena Dawley, Tiffany S, PA-C  atorvastatin (LIPITOR) 20 MG tablet Take 20 mg by mouth daily. 02/23/22  Yes [provider]  cyclobenzaprine (FLEXERIL) 10 MG tablet Take 10 mg by mouth 3 (three) times daily.   Yes [provider]  FARXIGA 10 MG TABS tablet Take 10 mg by mouth every morning. 02/23/22  Yes [provider]  lisinopril (ZESTRIL) 40 MG tablet Take 40 mg by mouth daily. 02/23/22  Yes [provider]  thiamine 100 MG tablet Take 1 tablet (100 mg total) by mouth daily. 03/17/17  Yes Amin, Jeanella Flattery, MD  metFORMIN (GLUCOPHAGE) 1000 MG tablet Take 1,000 mg by mouth 2 (two) times daily. Patient not taking: Reported on 02/25/2022 05/25/21   [provider]   DG Foot Complete Right  Result Date: 02/25/2022 CLINICAL DATA:  Right fifth digit swelling, pain EXAM: RIGHT FOOT COMPLETE - 3+ VIEW COMPARISON:  06/01/2020 FINDINGS: Frontal, oblique, and lateral views of the right foot are obtained. There is marked soft tissue swelling of the fifth digit. Erosive changes seen along the plantar aspect of the fifth distal phalanx on lateral view, diagnostic of osteomyelitis. No other acute or destructive bony lesions. Joint spaces  are relatively well preserved. IMPRESSION: 1. Osteomyelitis plantar aspect fifth distal phalanx. 2. Soft tissue swelling fifth digit consistent with cellulitis. Electronically Signed   By: Randa Ngo M.D.   On: 02/25/2022 18:32   - pertinent xrays, CT, MRI studies were reviewed and independently interpreted  Positive ROS: All other systems have been reviewed and were otherwise negative with the exception of those mentioned in the HPI and as above.  Physical Exam: General: Alert, no acute distress Psychiatric: Patient is competent for consent with normal mood and  affect Lymphatic: No axillary or cervical lymphadenopathy Cardiovascular: No pedal edema Respiratory: No cyanosis, no use of accessory musculature GI: No organomegaly, abdomen is soft and non-tender    Images:  '@ENCIMAGES'$ @  Labs:  Lab Results  Component Value Date   HGBA1C 11.8 (H) 02/25/2022   HGBA1C 6.9 07/08/2017   HGBA1C 6.7 04/09/2014   ESRSEDRATE 5 02/25/2022   ESRSEDRATE 7 11/24/2020   ESRSEDRATE 51 (H) 10/27/2020   CRP 1.6 (H) 02/25/2022   CRP 1.3 (H) 11/24/2020   CRP 7.7 (H) 10/27/2020   REPTSTATUS PENDING 02/25/2022   CULT PENDING 02/25/2022    Lab Results  Component Value Date   ALBUMIN 4.8 02/25/2022   ALBUMIN 4.4 06/12/2021   ALBUMIN 4.3 11/24/2020        Latest Ref Rng & Units 02/26/2022    3:18 AM 02/25/2022    8:45 PM 06/12/2021    8:55 AM  CBC EXTENDED  WBC 4.0 - 10.5 K/uL 8.0   10.6   11.5    RBC 4.22 - 5.81 MIL/uL 5.24   5.38   5.56    Hemoglobin 13.0 - 17.0 g/dL 15.4   16.2   16.6    HCT 39.0 - 52.0 % 44.0   44.9   46.4    Platelets 150 - 400 K/uL 149   175   174    NEUT# 1.7 - 7.7 K/uL  7.4   8.0    Lymph# 0.7 - 4.0 K/uL  2.5   2.5      Neurologic: Patient does not have protective sensation bilateral lower extremities.   MUSCULOSKELETAL:   Skin: Examination patient has cellulitis and swelling of the right little toe there is a callused ulcer over the tip of the toe.  Review of the radiographs show destructive bony changes consistent with chronic osteomyelitis.  Patient has a strongly palpable dorsalis pedis pulse.  Review of the radiograph shows destructive bony changes.  White blood cell count 8000 with a hemoglobin of 15.4.  Hemoglobin A1c 11.8.  Assessment: Assessment: Chronic osteomyelitis right little toe with swelling and cellulitis.  Plan: Plan: We will plan for amputation of the right foot fifth ray.  Risk and benefits were discussed including risk of the wound not healing further extent of the infection need for additional  surgery.  Patient states he understands wished to proceed at this time.  Thank you for the consult and the opportunity to see [name], [place] [phone] 8:38 AM

## 2022-02-26 NOTE — Anesthesia Postprocedure Evaluation (Signed)
Anesthesia Post Note  Patient: Albert Mcdonald  Procedure(s) Performed: AMPUTATION 5th TOE (Right)     Patient location during evaluation: PACU Anesthesia Type: General Level of consciousness: awake and alert and oriented Pain management: pain level controlled Vital Signs Assessment: post-procedure vital signs reviewed and stable Respiratory status: spontaneous breathing, nonlabored ventilation and respiratory function stable Cardiovascular status: blood pressure returned to baseline and stable Postop Assessment: no apparent nausea or vomiting Anesthetic complications: no   No notable events documented.  Last Vitals:  Vitals:   02/26/22 1337 02/26/22 1400  BP: 105/68 113/81  Pulse: 73 77  Resp: 10 18  Temp:  36.6 C  SpO2: 100% 97%    Last Pain:  Vitals:   02/26/22 1400  TempSrc:   PainSc: 0-No pain                 Janashia Parco,Shameek A.

## 2022-02-26 NOTE — Progress Notes (Signed)
PROGRESS NOTE  Albert Mcdonald  BZM:[id number] DOB: 10/01/1970 DOA: 02/25/2022 PCP: Leslie Andrea, MD   Brief Narrative:  Patient is a 51 year old male with history of diabetes type 2, hypertension, substance abuse who presented with 2-day history of redness, swelling of right fifth toe with pain.  No report of fever or chills.  On condition he was hypertensive, tachycardic.  X-ray of the right foot showed osteomyelitis of fifth distal phalanx with cellulitis.  Started on IV antibiotics.  Orthopedics consulted.  Plan for amputation of the right foot fifth ray.  Assessment & Plan:  Principal Problem:   Cellulitis Active Problems:   Type 2 diabetes mellitus ([facility])   Hypertension   Polysubstance abuse ([facility])   Osteomyelitis ([facility])  Acute osteomyelitis: Presented with pain, swelling, redness of right fifth toe.  X-ray showed osteomyelitis of the distal phalanx with history of cellulitis.  Patient was tachycardic on presentation, had mild leukocytosis but afebrile.  Patient has history of osteomyelitis in the same foot on 7/22 and was treated with 6 weeks course of IV antibiotics at [facility].  Started on antibiotics.  Orthopedics consulted.  Follow-up blood cultures. Plan for amputation of the right foot fifth ray.  History polysubstance abuse: Currently denies any use of drugs.  UDS negative  Hypertension: Monitor blood pressure.  Continue lisinopril, Norvasc  Type 2 diabetes: Currently on sliding scale insulin.  Takes farxiga at home.  Thrombocytopenia: Mild.  Continue to monitor          DVT prophylaxis:heparin injection 5,000 Units Start: 02/26/22 1400     Code Status: Full Code  Family Communication: None at the bedside  Patient status:Inpatient  Patient is from :Home  Anticipated discharge QA:ESLP  Estimated DC date: After orthopedics clearance   Consultants: Ortho  Procedures: Plan for amputation  Antimicrobials:  Anti-infectives (From admission,  onward)    Start     Dose/Rate Route Frequency Ordered Stop   02/26/22 0600  vancomycin (VANCOREADY) IVPB 1750 mg/350 mL        1,750 mg 175 mL/hr over 120 Minutes Intravenous Every 12 hours 02/26/22 0103     02/25/22 2230  cefTRIAXone (ROCEPHIN) 2 g in sodium chloride 0.9 % 100 mL IVPB        2 g 200 mL/hr over 30 Minutes Intravenous Every 24 hours 02/25/22 2215     02/25/22 2115  vancomycin (VANCOCIN) IVPB 1000 mg/200 mL premix        1,000 mg 200 mL/hr over 60 Minutes Intravenous  Once 02/25/22 2112 02/25/22 2231       Subjective: Patient seen and examined at the bedside this morning.  Hemodynamically stable.  Comfortable.  Denies any new complaints.  No increased pain or fever  Objective: Vitals:   02/25/22 2215 02/25/22 2300 02/26/22 0055 02/26/22 0523  BP: 135/86 118/83 (!) 145/97 (!) 139/92  Pulse: 92 92 88 76  Resp:  '19 20 18  '$ Temp:   98.5 F (36.9 C) 97.6 F (36.4 C)  TempSrc:   Oral Oral  SpO2: 97% 99% 96% 98%  Weight:   96.8 kg   Height:   '6\' 1"'$  (1.854 m)     Intake/Output Summary (Last 24 hours) at 02/26/2022 0752 Last data filed at 02/26/2022 0240 Gross per 24 hour  Intake 301.08 ml  Output 725 ml  Net -423.92 ml   Filed Weights   02/25/22 1805 02/26/22 0055  Weight: 96.2 kg 96.8 kg    Examination:  General exam: Overall comfortable, not in  distress HEENT: PERRL Respiratory system:  no wheezes or crackles  Cardiovascular system: S1 & S2 heard, RRR.  Gastrointestinal system: Abdomen is nondistended, soft and nontender. Central nervous system: Alert and oriented Extremities: No edema, no clubbing ,no cyanosis, reddish discoloration of the right fifth toe Skin: No rashes, no ulcers,no icterus     Data Reviewed: I have personally reviewed following labs and imaging studies  CBC: Recent Labs  Lab 02/25/22 2045 02/26/22 0318  WBC 10.6* 8.0  NEUTROABS 7.4  --   HGB 16.2 15.4  HCT 44.9 44.0  MCV 83.5 84.0  PLT 175 272*   Basic Metabolic  Panel: Recent Labs  Lab 02/25/22 2045 02/26/22 0318  NA 138 138  K 3.5 3.8  CL 102 104  CO2 28 26  GLUCOSE 258* 246*  BUN 17 13  CREATININE 0.66 0.72  CALCIUM 9.5 9.2     Recent Results (from the past 240 hour(s))  Culture, blood (Routine x 2)     Status: None (Preliminary result)   Collection Time: 02/25/22  8:45 PM   Specimen: Right Antecubital; Blood  Result Value Ref Range Status   Specimen Description RIGHT ANTECUBITAL  Final   Special Requests   Final    BOTTLES DRAWN AEROBIC AND ANAEROBIC Blood Culture adequate volume Performed at [facility], [address], [place], [place] [address]    Culture PENDING  Incomplete   Report Status PENDING  Incomplete  Culture, blood (Routine x 2)     Status: None (Preliminary result)   Collection Time: 02/25/22  9:13 PM   Specimen: Left Antecubital; Blood  Result Value Ref Range Status   Specimen Description LEFT ANTECUBITAL  Final   Special Requests   Final    BOTTLES DRAWN AEROBIC AND ANAEROBIC Blood Culture adequate volume Performed at [facility], [address], [place], [place] [address]    Culture PENDING  Incomplete   Report Status PENDING  Incomplete     Radiology Studies: DG Foot Complete Right  Result Date: 02/25/2022 CLINICAL DATA:  Right fifth digit swelling, pain EXAM: RIGHT FOOT COMPLETE - 3+ VIEW COMPARISON:  06/01/2020 FINDINGS: Frontal, oblique, and lateral views of the right foot are obtained. There is marked soft tissue swelling of the fifth digit. Erosive changes seen along the plantar aspect of the fifth distal phalanx on lateral view, diagnostic of osteomyelitis. No other acute or destructive bony lesions. Joint spaces are relatively well preserved. IMPRESSION: 1. Osteomyelitis plantar aspect fifth distal phalanx. 2. Soft tissue swelling fifth digit consistent with cellulitis. Electronically Signed   By: Randa Ngo M.D.   On: 02/25/2022 18:32    Scheduled Meds:  amLODipine  10 mg Oral Daily    atorvastatin  20 mg Oral Daily   heparin  5,000 Units Subcutaneous Q8H   insulin aspart  0-15 Units Subcutaneous Q6H   lisinopril  40 mg Oral Daily   Continuous Infusions:  sodium chloride 100 mL/hr at 02/25/22 2252   cefTRIAXone (ROCEPHIN)  IV Stopped (02/25/22 2340)   vancomycin 1,750 mg (02/26/22 0700)     LOS: 1 day   Shelly Coss, MD [facility] P6/10/2021, 7:52 AM

## 2022-02-26 NOTE — Anesthesia Procedure Notes (Signed)
Procedure Name: LMA Insertion Date/Time: 02/26/2022 12:53 PM Performed by: Griffin Dakin, CRNA Pre-anesthesia Checklist: Patient identified, Emergency Drugs available, Suction available, Patient being monitored and Timeout performed Patient Re-evaluated:Patient Re-evaluated prior to induction Oxygen Delivery Method: Circle system utilized Preoxygenation: Pre-oxygenation with 100% oxygen Induction Type: IV induction LMA: LMA inserted LMA Size: 5.0 Placement Confirmation: positive ETCO2 and breath sounds checked- equal and bilateral Tube secured with: Tape Dental Injury: Teeth and Oropharynx as per pre-operative assessment

## 2022-02-26 NOTE — Progress Notes (Signed)
Pharmacy Antibiotic Note  Albert Mcdonald is a 51 y.o. male admitted on 02/25/2022 with  osteomyelitis .  Pharmacy has been consulted for Vancomycin dosing. WBC 10.6. Renal function good.   Plan: Vancomycin 1750 mg IV q12h >>>Estimated AUC: 469 Ceftriaxone per MD Trend WBC, temp, renal function  F/U infectious work-up Drug levels as indicated   Height: '6\' 1"'$  (185.4 cm) Weight: 96.8 kg (213 lb 6.5 oz) IBW/kg (Calculated) : 79.9  Temp (24hrs), Avg:98.5 F (36.9 C), Min:98.5 F (36.9 C), Max:98.5 F (36.9 C)  Recent Labs  Lab 02/25/22 2045 02/25/22 2112  WBC 10.6*  --   CREATININE 0.66  --   LATICACIDVEN  --  1.7    Estimated Creatinine Clearance: 135.5 mL/min (by C-G formula based on SCr of 0.66 mg/dL).    Allergies  Allergen Reactions   Ace Inhibitors Rash    Narda Bonds, PharmD, BCPS Clinical Pharmacist Phone: [phone]

## 2022-02-27 ENCOUNTER — Encounter (HOSPITAL_COMMUNITY): Payer: Self-pay | Admitting: Orthopedic Surgery

## 2022-02-27 DIAGNOSIS — L03031 Cellulitis of right toe: Secondary | ICD-10-CM | POA: Diagnosis not present

## 2022-02-27 LAB — GLUCOSE, CAPILLARY
Glucose-Capillary: 136 mg/dL — ABNORMAL HIGH (ref 70–99)
Glucose-Capillary: 177 mg/dL — ABNORMAL HIGH (ref 70–99)
Glucose-Capillary: 193 mg/dL — ABNORMAL HIGH (ref 70–99)

## 2022-02-27 MED ORDER — INSULIN PEN NEEDLE 32G X 4 MM MISC
1.0000 | Freq: Three times a day (TID) | 1 refills | Status: DC
Start: 2022-02-27 — End: 2023-12-23

## 2022-02-27 MED ORDER — METFORMIN HCL 1000 MG PO TABS: 1000.0000 mg | ORAL_TABLET | Freq: Two times a day (BID) | ORAL | 1 refills | Status: DC

## 2022-02-27 MED ORDER — BLOOD GLUCOSE METER KIT: PACK | 0 refills | Status: DC

## 2022-02-27 MED ORDER — LANTUS SOLOSTAR 100 UNIT/ML ~~LOC~~ SOPN: 10.0000 [IU] | PEN_INJECTOR | Freq: Every day | SUBCUTANEOUS | 0 refills | Status: DC

## 2022-02-27 NOTE — Progress Notes (Signed)
Inpatient Diabetes Program Recommendations  AACE/ADA: New Consensus Statement on Inpatient Glycemic Control (2015)  Target Ranges:  Prepandial:   less than 140 mg/dL      Peak postprandial:   less than 180 mg/dL (1-2 hours)      Critically ill patients:  140 - 180 mg/dL   Lab Results  Component Value Date   GLUCAP 177 (H) 02/27/2022   HGBA1C 11.8 (H) 02/25/2022    Review of Glycemic Control  Diabetes history: DM2 Outpatient Diabetes medications: None Current orders for Inpatient glycemic control: Novolog 0-15 units TID  Inpatient Diabetes Program Recommendations:    For DC please consider,  Lantus 10 units QHS Metformin 1000 mg BID Follow up with PCP in 1-2 weeks  Lantus Solostar insulin pen order# [id number] Insulin pen needles order# [id number] Glucometer [phone]  Spoke with patient on the phone as this DM coordinator is working remotely from home on the weekend.    Reviewed patient's current A1c of 11.8% (average blood glucose of 291 mg/dL). Explained what a A1c is and what it measures. Also reviewed goal A1c with patient, importance of good glucose control @ home, and blood sugar goals.  He used to take Metformin 1000 mg daily but stopped taking it because he works outside and did not feel like he needed it.  He drinks Gatorade zero and a lot of milk.  He states he eats a lot of bread and sweets.  Educated him on basic patho of DM2, CHO's, The Plate Method, importance of portion control, impact of exercise on blood glucose and cutting back on sweets and limiting milk intake.    He live at home with his mother.  He has a learning disability and ADHD.  He has Medicare.  His mother uses the insulin pen and will be a good support for him.    Sent him a video on how to administer insulin using the pen.  Told him to administer his long acting (Lantus) insulin at bedtime.  Check his BG fasting and before bedtime.  Follow up with PCP in 1-2 weeks.  Bring meter to PCP appointment for  MD to review.  Discussed hypoglycemia, signs, symptoms and treatments.    Explained how to use insulin pen and how to store.  He verbalizes understanding.    Will continue to follow while inpatient.  Thank you, Reche Dixon, MSN, [place] Diabetes Coordinator Inpatient Diabetes Program [phone] (team pager from 8a-5p)

## 2022-02-27 NOTE — Discharge Summary (Signed)
Physician Discharge Summary  Albert Mcdonald:[id number] DOB: Sep 29, 1970 DOA: 02/25/2022  PCP: Leslie Andrea, MD  Admit date: 02/25/2022 Discharge date: 02/27/2022  Admitted From: Home Disposition:  Home  Discharge Condition:Stable CODE STATUS:FULL Diet recommendation:  Carb Modified  Brief/Interim Summary:  Patient is a 51 year old male with history of diabetes type 2, hypertension, substance abuse who presented with 2-day history of redness, swelling of right fifth toe with pain.  No report of fever or chills.  On condition he was hypertensive, tachycardic.  X-ray of the right foot showed osteomyelitis of fifth distal phalanx with cellulitis.  Started on IV antibiotics.  Orthopedics consulted status post amputation of the right foot fifth ray.  Orthopedics cleared him for discharge and recommended 1 week follow-up.  He has been started on insulin on discharge.  Medically stable for discharge home today.  Following problems were addressed during hospitalization:  Acute osteomyelitis: Presented with pain, swelling, redness of right fifth toe.  X-ray showed osteomyelitis of the distal phalanx with history of cellulitis.  Patient was tachycardic on presentation, had mild leukocytosis but afebrile.  Patient has history of osteomyelitis in the same foot on 7/22 and was treated with 6 weeks course of IV antibiotics at [facility].  Started on antibiotics.  Orthopedics consulted status post amputation of right foot fifth ray.  Blood cultures have been negative so far.  Orthopedics did not recommend antibiotics on discharge and recommend outpatient follow-up in a week.  He will be partial weightbearing on the right lower extremity    History polysubstance abuse: Currently denies any use of drugs.  UDS negative   Hypertension: Monitor blood pressure.  Continue lisinopril, Norvasc   Type 2 diabetes: Previously taking metformin but not taking anything for now.  Hemoglobin A1c of more than 10.   Diabetic coordinator  consulted.  Started on insulin, he will continue metformin 1 g twice a day at home   thrombocytopenia: Mild.  Continue to monitor as an outpatient    Discharge Diagnoses:  Principal Problem:   Cellulitis Active Problems:   Type 2 diabetes mellitus (HCC)   Hypertension   Polysubstance abuse (HCC)   Osteomyelitis of fifth toe of right foot [facility])    Discharge Instructions  Discharge Instructions     Diet Carb Modified   Complete by: As directed    Discharge instructions   Complete by: As directed    1)Please take prescribed medications as instructed 2)Monitor your blood sugars at home 3)Follow up with Dr. Sharol Given in a week.  Follow-up with your PCP closely for the management of your diabetes.   Increase activity slowly   Complete by: As directed    No wound care   Complete by: As directed    Non weight bearing   Complete by: As directed    Laterality: right   Extremity: Lower      Allergies as of 02/27/2022       Reactions   Ace Inhibitors Rash        Medication List     STOP taking these medications    Farxiga 10 MG Tabs tablet Generic drug: dapagliflozin propanediol       TAKE these medications    amLODipine 10 MG tablet Commonly known as: NORVASC Take 1 tablet (10 mg total) by mouth daily.   atorvastatin 20 MG tablet Commonly known as: LIPITOR Take 20 mg by mouth daily.   blood glucose meter kit and supplies Dispense based on patient and insurance preference. Use up  to four times daily as directed. (FOR ICD-10 E10.9, E11.9).   cyclobenzaprine 10 MG tablet Commonly known as: FLEXERIL Take 10 mg by mouth 3 (three) times daily.   Insulin Pen Needle 32G X 4 MM Misc 1 each by Does not apply route 3 (three) times daily.   Lantus SoloStar 100 UNIT/ML Solostar Pen Generic drug: insulin glargine Inject 10 Units into the skin daily.   lisinopril 40 MG tablet Commonly known as: ZESTRIL Take 40 mg by mouth daily.   metFORMIN  1000 MG tablet Commonly known as: GLUCOPHAGE Take 1 tablet (1,000 mg total) by mouth 2 (two) times daily.   thiamine 100 MG tablet Take 1 tablet (100 mg total) by mouth daily.               Discharge Care Instructions  (From admission, onward)           Start     Ordered   02/26/22 0000  Non weight bearing       Question Answer Comment  Laterality right   Extremity Lower      02/26/22 1321            Follow-up Information     Albert Minion, MD Follow up in 1 week(s).   Specialty: Orthopedic Surgery Contact information: [place] [address] [phone]         Leslie Andrea, MD. Schedule an appointment as soon as possible for a visit in 1 week(s).   Specialty: Family Medicine Contact information: [place] [address] [phone]                Allergies  Allergen Reactions   Ace Inhibitors Rash    Consultations: Orthopedics   Procedures/Studies: DG Foot Complete Right  Result Date: 02/25/2022 CLINICAL DATA:  Right fifth digit swelling, pain EXAM: RIGHT FOOT COMPLETE - 3+ VIEW COMPARISON:  06/01/2020 FINDINGS: Frontal, oblique, and lateral views of the right foot are obtained. There is marked soft tissue swelling of the fifth digit. Erosive changes seen along the plantar aspect of the fifth distal phalanx on lateral view, diagnostic of osteomyelitis. No other acute or destructive bony lesions. Joint spaces are relatively well preserved. IMPRESSION: 1. Osteomyelitis plantar aspect fifth distal phalanx. 2. Soft tissue swelling fifth digit consistent with cellulitis. Electronically Signed   By: Randa Ngo M.D.   On: 02/25/2022 18:32      Subjective: Patient seen and examined at bedside this morning.  Hemodynamically stable for discharge today.  Discharge Exam: Vitals:   02/27/22 0454 02/27/22 0925  BP: 122/87 113/72  Pulse: 70 80  Resp: 18 18  Temp: (!) 97.5 F (36.4 C) 97.8 F (36.6 C)   SpO2: 98% 100%   Vitals:   02/26/22 2309 02/27/22 0312 02/27/22 0454 02/27/22 0925  BP: 138/80 128/89 122/87 113/72  Pulse: 89 74 70 80  Resp: _0 Temp: 98.1 F (36.7 C) 98 F (36.7 C) (!) 97.5 F (36.4 C) 97.8 F (36.6 C)  TempSrc: Oral Oral Oral   SpO2: 99% 99% 98% 100%  Weight:      Height:        General: Pt is alert, awake, not in acute distress Cardiovascular: RRR, S1/S2 +, no rubs, no gallops Respiratory: CTA bilaterally, no wheezing, no rhonchi Abdominal: Soft, NT, ND, bowel sounds + Extremities: no edema, no cyanosis, right lower extremity wrapped with dressing.    The results of significant diagnostics from this hospitalization (  including imaging, microbiology, ancillary and laboratory) are listed below for reference.     Microbiology: Recent Results (from the past 240 hour(s))  Culture, blood (Routine x 2)     Status: None (Preliminary result)   Collection Time: 02/25/22  8:45 PM   Specimen: Right Antecubital; Blood  Result Value Ref Range Status   Specimen Description RIGHT ANTECUBITAL  Final   Special Requests   Final    BOTTLES DRAWN AEROBIC AND ANAEROBIC Blood Culture adequate volume   Culture   Final    NO GROWTH 2 DAYS Performed at [facility], [address] [place], [place] [address]    Report Status PENDING  Incomplete  Culture, blood (Routine x 2)     Status: None (Preliminary result)   Collection Time: 02/25/22  9:13 PM   Specimen: Left Antecubital; Blood  Result Value Ref Range Status   Specimen Description LEFT ANTECUBITAL  Final   Special Requests   Final    BOTTLES DRAWN AEROBIC AND ANAEROBIC Blood Culture adequate volume   Culture   Final    NO GROWTH 2 DAYS Performed at [facility], [address], [place], [place] [address]    Report Status PENDING  Incomplete     Labs: BNP (last 3 results) No results for input(s): BNP in the last 8760 hours. Basic Metabolic Panel: Recent Labs  Lab 02/25/22 2045 02/26/22 0318   NA 138 138  K 3.5 3.8  CL 102 104  CO2 28 26  GLUCOSE 258* 246*  BUN 17 13  CREATININE 0.66 0.72  CALCIUM 9.5 9.2   Liver Function Tests: Recent Labs  Lab 02/25/22 2045  AST 16  ALT 23  ALKPHOS 120  BILITOT 0.7  PROT 7.8  ALBUMIN 4.8   No results for input(s): LIPASE, AMYLASE in the last 168 hours. No results for input(s): AMMONIA in the last 168 hours. CBC: Recent Labs  Lab 02/25/22 2045 02/26/22 0318  WBC 10.6* 8.0  NEUTROABS 7.4  --   HGB 16.2 15.4  HCT 44.9 44.0  MCV 83.5 84.0  PLT 175 149*   Cardiac Enzymes: No results for input(s): CKTOTAL, CKMB, CKMBINDEX, TROPONINI in the last 168 hours. BNP: Invalid input(s): POCBNP CBG: Recent Labs  Lab 02/26/22 1639 02/26/22 2057 02/26/22 2329 02/27/22 0315 02/27/22 0738  GLUCAP 343* 430* 400* 136* 177*   D-Dimer No results for input(s): DDIMER in the last 72 hours. Hgb A1c Recent Labs    02/25/22 2045  HGBA1C 11.8*   Lipid Profile No results for input(s): CHOL, HDL, LDLCALC, TRIG, CHOLHDL, LDLDIRECT in the last 72 hours. Thyroid function studies No results for input(s): TSH, T4TOTAL, T3FREE, THYROIDAB in the last 72 hours.  Invalid input(s): FREET3 Anemia work up No results for input(s): VITAMINB12, FOLATE, FERRITIN, TIBC, IRON, RETICCTPCT in the last 72 hours. Urinalysis    Component Value Date/Time   COLORURINE YELLOW 03/14/2017 2022   APPEARANCEUR CLEAR 03/14/2017 2022   LABSPEC 1.027 03/14/2017 2022   PHURINE 6.0 03/14/2017 2022   GLUCOSEU 50 (A) 03/14/2017 2022   HGBUR NEGATIVE 03/14/2017 2022   BILIRUBINUR NEGATIVE 03/14/2017 2022   KETONESUR 20 (A) 03/14/2017 2022   PROTEINUR 30 (A) 03/14/2017 2022   UROBILINOGEN 1.0 10/28/2009 0030   NITRITE NEGATIVE 03/14/2017 2022   LEUKOCYTESUR NEGATIVE 03/14/2017 2022   Sepsis Labs Invalid input(s): PROCALCITONIN,  WBC,  LACTICIDVEN Microbiology Recent Results (from the past 240 hour(s))  Culture, blood (Routine x 2)     Status: None  (Preliminary result)  Collection Time: 02/25/22  8:45 PM   Specimen: Right Antecubital; Blood  Result Value Ref Range Status   Specimen Description RIGHT ANTECUBITAL  Final   Special Requests   Final    BOTTLES DRAWN AEROBIC AND ANAEROBIC Blood Culture adequate volume   Culture   Final    NO GROWTH 2 DAYS Performed at [facility], [address] [place], [place] [address]    Report Status PENDING  Incomplete  Culture, blood (Routine x 2)     Status: None (Preliminary result)   Collection Time: 02/25/22  9:13 PM   Specimen: Left Antecubital; Blood  Result Value Ref Range Status   Specimen Description LEFT ANTECUBITAL  Final   Special Requests   Final    BOTTLES DRAWN AEROBIC AND ANAEROBIC Blood Culture adequate volume   Culture   Final    NO GROWTH 2 DAYS Performed at [facility], [address] [place], [place] [address]    Report Status PENDING  Incomplete    Please note: You were cared for by a hospitalist during your hospital stay. Once you are discharged, your primary care physician will handle any further medical issues. Please note that NO REFILLS for any discharge medications will be authorized once you are discharged, as it is imperative that you return to your primary care physician (or establish a relationship with a primary care physician if you do not have one) for your post hospital discharge needs so that they can reassess your need for medications and monitor your lab values.    Time coordinating discharge: 40 minutes  SIGNED:   Shelly Coss, MD  [facility] 02/27/2022, 11:38 AM Pager [phone]  If 7PM-7AM, please contact night-coverage [email or web address] Password [email or web address]

## 2022-02-27 NOTE — Progress Notes (Signed)
Physical Therapy Treatment Patient Details Name: Albert Mcdonald MRN: [id number] DOB: [birth year] Today's Date: 02/27/2022   History of Present Illness 51 y/o male presented to ED on 02/25/22 for 5th digit pain and redness. Imaging shows destructive bony changes of R 5th digit with chronic osteomyelitis. S/p R 5th ray amputation on 6/2. PMH: HTN, T2DM, OCD, ALS, polysubstance abuse    PT Comments    Patient continues to demonstrate impulsive behaviors with mobility. Required extensive education on importance of NWB, high fall risk, and need to slow down for safety, patient and parents verbalized understanding. Patient requires supervision for mobility with use of RW. D/c plan remains appropriate.     Recommendations for follow up therapy are one component of a multi-disciplinary discharge planning process, led by the attending physician.  Recommendations may be updated based on patient status, additional functional criteria and insurance authorization.  Follow Up Recommendations  No PT follow up     Assistance Recommended at Discharge Intermittent Supervision/Assistance  Patient can return home with the following     Equipment Recommendations  None recommended by PT    Recommendations for Other Services       Precautions / Restrictions Precautions Precautions: Fall Restrictions Weight Bearing Restrictions: Yes RLE Weight Bearing: Non weight bearing     Mobility  Bed Mobility               General bed mobility comments: in chair on arrival    Transfers Overall transfer level: Needs assistance Equipment used: Rolling Desmon Hitchner (2 wheels) Transfers: Sit to/from Stand Sit to Stand: Supervision           General transfer comment: impulsively stood from chair without RW present and grasping onto IV pole. Instructed patient to wait for therapist's assistance and RW prior to standing.    Ambulation/Gait Ambulation/Gait assistance: Supervision Gait Distance (Feet): 75  Feet Assistive device: Rolling Abigael Mogle (2 wheels) Gait Pattern/deviations:  (hop to) Gait velocity: impulsive and quick     General Gait Details: impulsive throughout. Required extensive education on slowing down for safety and to maintain NWB throughout mobility.   Stairs             Wheelchair Mobility    Modified Rankin (Stroke Patients Only)       Balance Overall balance assessment: Needs assistance Sitting-balance support: No upper extremity supported, Feet supported Sitting balance-Leahy Scale: Normal     Standing balance support: Bilateral upper extremity supported, Reliant on assistive device for balance Standing balance-Leahy Scale: Poor Standing balance comment: reliant on B UE support                            Cognition Arousal/Alertness: Awake/alert Behavior During Therapy: Impulsive Overall Cognitive Status: Within Functional Limits for tasks assessed                                 General Comments: hx of autism and high functioning ADHD per patient report        Exercises      General Comments General comments (skin integrity, edema, etc.): Mother and father present during session      Pertinent Vitals/Pain Pain Assessment Pain Assessment: No/denies pain    Home Living                          Prior Function  PT Goals (current goals can now be found in the care plan section) Acute Rehab PT Goals Patient Stated Goal: to go home PT Goal Formulation: With patient Time For Goal Achievement: 03/12/22 Potential to Achieve Goals: Good Progress towards PT goals: Progressing toward goals    Frequency    Min 3X/week      PT Plan Current plan remains appropriate    Co-evaluation              AM-PAC PT "6 Clicks" Mobility   Outcome Measure  Help needed turning from your back to your side while in a flat bed without using bedrails?: None Help needed moving from lying on your  back to sitting on the side of a flat bed without using bedrails?: None Help needed moving to and from a bed to a chair (including a wheelchair)?: A Little Help needed standing up from a chair using your arms (e.g., wheelchair or bedside chair)?: A Little Help needed to walk in hospital room?: A Little Help needed climbing 3-5 steps with a railing? : A Lot 6 Click Score: 19    End of Session   Activity Tolerance: Patient tolerated treatment well Patient left: in chair;with call bell/phone within reach;with family/visitor present Nurse Communication: Mobility status PT Visit Diagnosis: Unsteadiness on feet (R26.81);Muscle weakness (generalized) (M62.81);Other abnormalities of gait and mobility (R26.89)     Time: 5947-0761 PT Time Calculation (min) (ACUTE ONLY): 15 min  Charges:  $Gait Training: 8-22 mins                     Antwine Agosto A. Gilford Rile PT, DPT Acute Rehabilitation Services Pager [phone] Office [phone]    Linna Hoff 02/27/2022, 1:03 PM

## 2022-02-27 NOTE — Progress Notes (Signed)
DISCHARGE NOTE HOME Albert Mcdonald to be discharged Home per MD order. Discussed prescriptions and follow up appointments with the patient. Prescriptions given to patient; medication list explained in detail. Patient verbalized understanding.  Skin clean, dry and intact without evidence of skin break down, no evidence of skin tears noted. IV catheter discontinued intact. Site without signs and symptoms of complications. Dressing and pressure applied. Pt denies pain at the site currently. No complaints noted.  Patient free of lines, drains, and wounds.   An After Visit Summary (AVS) was printed and given to the patient. Patient escorted via wheelchair, and discharged home via private auto.  Arlyss Repress, RN

## 2022-02-27 NOTE — Progress Notes (Signed)
Patient ID: Albert Mcdonald, male   DOB: 11-16-1970, 51 y.o.   MRN: [id number] Patient is postoperative day 1 right foot fifth ray amputation.  He is sitting up in a chair comfortable dressing is dry.  Patient may discharge to home when safe with therapy.  I will follow-up in the office this next week.  Discussed the importance of minimizing weightbearing on the front of his foot and keeping the dressing dry.  He will call the office if he has any questions.

## 2022-02-27 NOTE — TOC Transition Note (Signed)
Transition of Care [facility]) - CM/SW Discharge Note   Patient Details  Name: Albert Mcdonald MRN: [id number] Date of Birth: 1971/04/30  [facility] CM/SW Contact:  Konrad Penta, RN Phone Number: [phone] 02/27/2022, 9:05 AM   Clinical Narrative:   Spoke with Mr. Vences about TOC needs. Mr. Petion reports he lives with his mother and father. States his sister lives nearby and she provides assistance.  States he has all necessary DME at home. Has rolling walker, scooter, bedside commode, shower seat. States his family will pick him up from the hospital if he discharges today.   No PT recommendations for home health.   Please re-consult if TOC needs identified.    Final next level of care: Home/Self Care Barriers to Discharge: No Barriers Identified   Patient Goals and CMS Choice Patient states their goals for this hospitalization and ongoing recovery are:: return home      Discharge Placement                       Discharge Plan and Services                                     Social Determinants of Health (SDOH) Interventions     Readmission Risk Interventions     View : No data to display.

## 2022-03-02 LAB — CULTURE, BLOOD (ROUTINE X 2)
Culture: NO GROWTH
Culture: NO GROWTH
Special Requests: ADEQUATE
Special Requests: ADEQUATE

## 2022-03-09 ENCOUNTER — Ambulatory Visit (INDEPENDENT_AMBULATORY_CARE_PROVIDER_SITE_OTHER): Payer: Medicare Other | Admitting: Orthopedic Surgery

## 2022-03-09 DIAGNOSIS — Z89421 Acquired absence of other right toe(s): Secondary | ICD-10-CM

## 2022-03-09 DIAGNOSIS — S98131A Complete traumatic amputation of one right lesser toe, initial encounter: Secondary | ICD-10-CM

## 2022-03-18 ENCOUNTER — Encounter: Payer: Self-pay | Admitting: Orthopedic Surgery

## 2022-04-12 ENCOUNTER — Encounter: Payer: Self-pay | Admitting: Orthopedic Surgery

## 2022-04-12 ENCOUNTER — Ambulatory Visit (INDEPENDENT_AMBULATORY_CARE_PROVIDER_SITE_OTHER): Payer: Medicare Other | Admitting: Orthopedic Surgery

## 2022-04-12 DIAGNOSIS — S98131A Complete traumatic amputation of one right lesser toe, initial encounter: Secondary | ICD-10-CM

## 2022-04-12 DIAGNOSIS — Z89421 Acquired absence of other right toe(s): Secondary | ICD-10-CM

## 2022-04-12 NOTE — Progress Notes (Signed)
Office Visit Note   Patient: [facility]           Date of Birth: Jan 27, 1971           MRN: [id number] Visit Date: 04/12/2022              Requested by: Leslie Andrea, MD [address] [place],  [place] [address] PCP: Leslie Andrea, MD  Chief Complaint  Patient presents with   Right Foot - Routine Post Op    02/26/22 right 5th ray amputation      HPI: Patient is a 51 year old gentleman who presents 6 weeks status post right fifth ray amputation.  He is currently in regular shoes.  Assessment & Plan: Visit Diagnoses:  1. Amputation of fifth toe of right foot ([place])     Plan: Recommended advancing his time in his work shoes and when he can wear them for a day without developing any side effects then he can resume work.  Follow-Up Instructions: Return in about 4 weeks (around 05/10/2022).   Ortho Exam  Patient is alert, oriented, no adenopathy, well-dressed, normal affect, normal respiratory effort. Examination the incision is well-healed there is callus over the lateral aspect of his foot from pressure.  There is no cellulitis no drainage.  Imaging: No results found. No images are attached to the encounter.  Labs: Lab Results  Component Value Date   HGBA1C 11.8 (H) 02/25/2022   HGBA1C 6.9 07/08/2017   HGBA1C 6.7 04/09/2014   ESRSEDRATE 5 02/25/2022   ESRSEDRATE 7 11/24/2020   ESRSEDRATE 51 (H) 10/27/2020   CRP 1.6 (H) 02/25/2022   CRP 1.3 (H) 11/24/2020   CRP 7.7 (H) 10/27/2020   REPTSTATUS 03/02/2022 FINAL 02/25/2022   CULT  02/25/2022    NO GROWTH 5 DAYS Performed at [facility], [address], [place], [place] [address]      Lab Results  Component Value Date   ALBUMIN 4.8 02/25/2022   ALBUMIN 4.4 06/12/2021   ALBUMIN 4.3 11/24/2020    No results found for: "MG" Lab Results  Component Value Date   VD25OH 9.3 (L) 04/09/2014    No results found for: "PREALBUMIN"    Latest Ref Rng & Units 02/26/2022    3:18 AM 02/25/2022    8:45 PM  06/12/2021    8:55 AM  CBC EXTENDED  WBC 4.0 - 10.5 K/uL 8.0  10.6  11.5   RBC 4.22 - 5.81 MIL/uL 5.24  5.38  5.56   Hemoglobin 13.0 - 17.0 g/dL 15.4  16.2  16.6   HCT 39.0 - 52.0 % 44.0  44.9  46.4   Platelets 150 - 400 K/uL 149  175  174   NEUT# 1.7 - 7.7 K/uL  7.4  8.0   Lymph# 0.7 - 4.0 K/uL  2.5  2.5      There is no height or weight on file to calculate BMI.  Orders:  No orders of the defined types were placed in this encounter.  No orders of the defined types were placed in this encounter.    Procedures: No procedures performed  Clinical Data: No additional findings.  ROS:  All other systems negative, except as noted in the HPI. Review of Systems  Objective: Vital Signs: There were no vitals taken for this visit.  Specialty Comments:  No specialty comments available.  PMFS History: Patient Active Problem List   Diagnosis Date Noted   Benign neoplasm of skin of right shoulder 05/11/2021   Achilles rupture,  left 04/29/2021   Epidermoid cyst of skin 04/13/2021   Benign neoplasm of skin of neck 04/13/2021   Noncompliance with medication treatment due to overuse of medication 03/28/2021   Osteomyelitis of fifth toe of right foot ([place]) 10/23/2020   Gram-positive bacteremia 10/21/2020   Soft tissue infection of thoracic spine 10/21/2020   Cellulitis 10/19/2020   Discitis of cervicothoracic region 10/19/2020   Elevated WBCs 10/19/2020   Hyperglycemia 10/19/2020   Depression 08/09/2017   Diabetic neuropathy ([place]) 07/08/2017   Acute encephalopathy 03/15/2017   Drug overdose 98/07/9146   Acute metabolic encephalopathy 82/95/6213   Polysubstance abuse ([place]) 03/14/2017   Cocaine abuse ([place])    Type 2 diabetes mellitus ([place])    Hyperlipidemia    Hypertension    Restless leg    Family history of anesthesia complication    Cervical spondylosis with myelopathy 09/29/2012   OCD (obsessive compulsive disorder) 03/16/2011   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 No pertinent family history.  Past Surgical History:  Procedure Laterality Date   AMPUTATION Right 02/26/2022   Procedure: AMPUTATION 5th TOE;  Surgeon: [name], MD;  Location: [facility];  Service: Orthopedics;  Laterality: Right;   ANTERIOR CERVICAL DECOMP/DISCECTOMY FUSION  11/09/2012   Procedure: ANTERIOR CERVICAL DECOMPRESSION/DISCECTOMY FUSION 3 LEVELS C4-C7 ;  Surgeon: Melina Schools, MD;  Location: [facility];  Service: Orthopedics;;  ACDF C4-C7    Social History   Occupational History   Not on file  Tobacco Use   Smoking status: Never   Smokeless tobacco: Never  Vaping Use   Vaping Use: Never used  Substance and Sexual Activity   Alcohol use: Yes   Drug use: No   Sexual activity: Not on file

## 2022-05-11 ENCOUNTER — Emergency Department (HOSPITAL_COMMUNITY)
Admission: EM | Admit: 2022-05-11 | Discharge: 2022-05-11 | Disposition: A | Discharge status: Home / Self Care | Payer: Medicare Other | Attending: Emergency Medicine | Admitting: Emergency Medicine

## 2022-05-11 ENCOUNTER — Other Ambulatory Visit: Payer: Self-pay

## 2022-05-11 ENCOUNTER — Emergency Department (HOSPITAL_COMMUNITY): Payer: Medicare Other

## 2022-05-11 ENCOUNTER — Encounter (HOSPITAL_COMMUNITY): Payer: Self-pay

## 2022-05-11 DIAGNOSIS — E119 Type 2 diabetes mellitus without complications: Secondary | ICD-10-CM | POA: Insufficient documentation

## 2022-05-11 DIAGNOSIS — I10 Essential (primary) hypertension: Secondary | ICD-10-CM | POA: Insufficient documentation

## 2022-05-11 DIAGNOSIS — Z794 Long term (current) use of insulin: Secondary | ICD-10-CM | POA: Insufficient documentation

## 2022-05-11 DIAGNOSIS — Z7984 Long term (current) use of oral hypoglycemic drugs: Secondary | ICD-10-CM | POA: Diagnosis not present

## 2022-05-11 DIAGNOSIS — Z79899 Other long term (current) drug therapy: Secondary | ICD-10-CM | POA: Diagnosis not present

## 2022-05-11 DIAGNOSIS — R079 Chest pain, unspecified: Secondary | ICD-10-CM | POA: Insufficient documentation

## 2022-05-11 LAB — CBC
HCT: 52.3 % — ABNORMAL HIGH (ref 39.0–52.0)
Hemoglobin: 17.9 g/dL — ABNORMAL HIGH (ref 13.0–17.0)
MCH: 29.3 pg (ref 26.0–34.0)
MCHC: 34.2 g/dL (ref 30.0–36.0)
MCV: 85.6 fL (ref 80.0–100.0)
Platelets: 206 10*3/uL (ref 150–400)
RBC: 6.11 MIL/uL — ABNORMAL HIGH (ref 4.22–5.81)
RDW: 12.3 % (ref 11.5–15.5)
WBC: 11.8 10*3/uL — ABNORMAL HIGH (ref 4.0–10.5)
nRBC: 0 % (ref 0.0–0.2)

## 2022-05-11 LAB — BASIC METABOLIC PANEL
Anion gap: 12 (ref 5–15)
BUN: 27 mg/dL — ABNORMAL HIGH (ref 6–20)
CO2: 27 mmol/L (ref 22–32)
Calcium: 10.8 mg/dL — ABNORMAL HIGH (ref 8.9–10.3)
Chloride: 101 mmol/L (ref 98–111)
Creatinine, Ser: 1.51 mg/dL — ABNORMAL HIGH (ref 0.61–1.24)
GFR, Estimated: 56 mL/min — ABNORMAL LOW (ref >60)
Glucose, Bld: 311 mg/dL — ABNORMAL HIGH (ref 70–99)
Potassium: 4.5 mmol/L (ref 3.5–5.1)
Sodium: 140 mmol/L (ref 135–145)

## 2022-05-11 LAB — TROPONIN I (HIGH SENSITIVITY)
Troponin I (High Sensitivity): 4 ng/L (ref <18)
Troponin I (High Sensitivity): 4 ng/L (ref <18)

## 2022-05-11 NOTE — ED Provider Notes (Signed)
[facility] EMERGENCY DEPARTMENT Provider Note   CSN: [id number] Arrival date & time: 05/11/22  1744     History  Chief Complaint  Patient presents with   Chest Pain    Albert Mcdonald is a 51 y.o. male.  Patient is a 51 year old male with past medical history of hypertension, hyperlipidemia, and type 2 diabetes.  Patient presenting today with complaints of chest discomfort.  He reports working outdoors performing aeration of a yard when he developed tightness in his chest, nausea, and became very sweaty.  This lasted for several hours, then resolved.  He denies any shortness of breath, leg pain or swelling.  Patient denies any prior cardiac history.  He denies having had a stress test or heart cath in the past.  Patient is a non-smoker and denies family history.  The history is provided by the patient.       Home Medications Prior to Admission medications   Medication Sig Start Date End Date Taking? Authorizing Provider  atorvastatin (LIPITOR) 20 MG tablet Take 20 mg by mouth daily. 02/23/22  Yes [provider]  insulin glargine (LANTUS SOLOSTAR) 100 UNIT/ML Solostar Pen Inject 10 Units into the skin daily. 02/27/22  Yes Shelly Coss, MD  lisinopril (ZESTRIL) 40 MG tablet Take 40 mg by mouth daily. 02/23/22  Yes [provider]  metFORMIN (GLUCOPHAGE) 1000 MG tablet Take 1 tablet (1,000 mg total) by mouth 2 (two) times daily. 02/27/22  Yes Shelly Coss, MD  thiamine 100 MG tablet Take 1 tablet (100 mg total) by mouth daily. 03/17/17  Yes Amin, Jeanella Flattery, MD  blood glucose meter kit and supplies Dispense based on patient and insurance preference. Use up to four times daily as directed. (FOR ICD-10 E10.9, E11.9). 02/27/22   Shelly Coss, MD  cyclobenzaprine (FLEXERIL) 10 MG tablet Take 10 mg by mouth 3 (three) times daily. Patient not taking: Reported on 05/11/2022    [provider]  FARXIGA 10 MG TABS tablet Take 10 mg by mouth every  morning. Patient not taking: Reported on 05/11/2022 03/26/22   [provider]  Insulin Pen Needle 32G X 4 MM MISC 1 each by Does not apply route 3 (three) times daily. 02/27/22   Shelly Coss, MD      Allergies    Ace inhibitors    Review of Systems   Review of Systems  All other systems reviewed and are negative.   Physical Exam Updated Vital Signs BP 121/86   Pulse 74   Temp 98 F (36.7 C)   Resp 18   Ht _0  (1.854 m)   Wt 96.7 kg   SpO2 100%   BMI 28.12 kg/m  Physical Exam Vitals and nursing note reviewed.  Constitutional:      General: He is not in acute distress.    Appearance: He is well-developed. He is not diaphoretic.  HENT:     Head: Normocephalic and atraumatic.  Cardiovascular:     Rate and Rhythm: Normal rate and regular rhythm.     Heart sounds: No murmur heard.    No friction rub.  Pulmonary:     Effort: Pulmonary effort is normal. No respiratory distress.     Breath sounds: Normal breath sounds. No wheezing or rales.  Abdominal:     General: Bowel sounds are normal. There is no distension.     Palpations: Abdomen is soft.     Tenderness: There is no abdominal tenderness.  Musculoskeletal:  General: Normal range of motion.     Cervical back: Normal range of motion and neck supple.     Right lower leg: No tenderness. No edema.     Left lower leg: No tenderness. No edema.  Skin:    General: Skin is warm and dry.  Neurological:     Mental Status: He is alert and oriented to person, place, and time.     Coordination: Coordination normal.     ED Results / Procedures / Treatments   Labs (all labs ordered are listed, but only abnormal results are displayed) Labs Reviewed  BASIC METABOLIC PANEL - Abnormal; Notable for the following components:      Result Value   Glucose, Bld 311 (*)    BUN 27 (*)    Creatinine, Ser 1.51 (*)    Calcium 10.8 (*)    GFR, Estimated 56 (*)    All other components within normal limits  CBC -  Abnormal; Notable for the following components:   WBC 11.8 (*)    RBC 6.11 (*)    Hemoglobin 17.9 (*)    HCT 52.3 (*)    All other components within normal limits  TROPONIN I (HIGH SENSITIVITY)  TROPONIN I (HIGH SENSITIVITY)    EKG EKG Interpretation  Date/Time:  Tuesday May 11 2022 18:05:50 EDT Ventricular Rate:  90 PR Interval:  170 QRS Duration: 96 QT Interval:  352 QTC Calculation: 430 R Axis:   71 Text Interpretation: Normal sinus rhythm Normal ECG When compared with ECG of 17-Oct-2020 18:41, no significant changes noted Confirmed by Veryl Speak [phone]) on 05/11/2022 11:01:37 PM  Radiology DG Chest 2 View  Result Date: 05/11/2022 CLINICAL DATA:  Chest pain EXAM: CHEST - 2 VIEW COMPARISON:  Chest x-ray 10/17/2020 FINDINGS: The heart size and mediastinal contours are within normal limits. Both lungs are clear. No acute fractures are seen. There is a cervical spinal fusion plate, unchanged. IMPRESSION: No active cardiopulmonary disease. Electronically Signed   By: Ronney Asters M.D.   On: 05/11/2022 18:32    Procedures Procedures    Medications Ordered in ED Medications - No data to display  ED Course/ Medical Decision Making/ A&P  Patient presenting here with complaints of chest pain as described in the HPI.  This began while he was working in the heat and humidity.  His work-up here is unremarkable.  Laboratory studies show negative troponin x2 and EKG is unchanged from priors.  His chest x-ray is clear.  I doubt aortic dissection, pneumothorax, or PE.  Symptoms may be heat related, but no evidence for a cardiac etiology.  He has no prior cardiac history, but does have several risk factors.  I feel as though patient can safely be discharged with outpatient follow-up.  He does understand to return to the ER if symptoms worsen or change.  Final Clinical Impression(s) / ED Diagnoses Final diagnoses:  None    Rx / DC Orders ED Discharge Orders     None          Veryl Speak, MD 05/11/22 2313

## 2022-05-11 NOTE — ED Triage Notes (Signed)
Patient reports central chest pain since this am with nausea and diaphoresis. States that he felt wheezy one hour ago.

## 2022-05-11 NOTE — Discharge Instructions (Signed)
A referral to cardiology has been placed for you.  You should call the cardiology office here in [place] tomorrow to arrange this appointment.  Their contact information has been provided in this discharge summary for you to call and make these arrangements.  Rest.  Return to the emergency department if you develop recurrent chest pain, difficulty breathing, or for other new and concerning symptoms.

## 2022-05-13 ENCOUNTER — Ambulatory Visit: Payer: Medicare Other | Admitting: Orthopedic Surgery

## 2022-05-14 ENCOUNTER — Ambulatory Visit (INDEPENDENT_AMBULATORY_CARE_PROVIDER_SITE_OTHER): Payer: Medicare Other | Admitting: Cardiology

## 2022-05-14 ENCOUNTER — Encounter: Payer: Self-pay | Admitting: Cardiology

## 2022-05-14 VITALS — BP 124/82 | HR 88 | Ht 73.0 in | Wt 213.6 lb

## 2022-05-14 DIAGNOSIS — R079 Chest pain, unspecified: Secondary | ICD-10-CM

## 2022-05-14 NOTE — Progress Notes (Signed)
Clinical Summary Albert Mcdonald is a 51 y.o.male seen as new consult, referred by Dr Karie Kirks for the following medical problems  1.Chest pain - ER visit 05/11/22 with chest pain - trop neg x 2. EKG SR no acute ischemic changes  - symptoms started last few days - first episode occurred while out in the yard. Started very feeling dizzy, sat down and rested  - episode while outside. Pressure lower left chest, 8/10 in severity. Lasts a few minutes, resolved with rest. Got sweaty and diaphoretic. Not positional. Recurrent episode later that day.     CAD risk factors: DM2, HL, HTN   2. DM2 - HgbA1c 11.8 02/2022   3. AKI - has f/u with pcp - was outside in the heat.   Past Medical History:  Diagnosis Date   ALS (amyotrophic lateral sclerosis) ([place])    patient denied on 11/01/12 -- has KLS not ALS   Diabetes mellitus    Family history of anesthesia complication    Hyperlipidemia    Hypertension    Kleine-Levin syndrome    OCD (obsessive compulsive disorder)    Restless leg      Allergies  Allergen Reactions   Ace Inhibitors Rash     Current Outpatient Medications  Medication Sig Dispense Refill   atorvastatin (LIPITOR) 20 MG tablet Take 20 mg by mouth daily.     blood glucose meter kit and supplies Dispense based on patient and insurance preference. Use up to four times daily as directed. (FOR ICD-10 E10.9, E11.9). 1 each 0   cyclobenzaprine (FLEXERIL) 10 MG tablet Take 10 mg by mouth 3 (three) times daily. (Patient not taking: Reported on 05/11/2022)     FARXIGA 10 MG TABS tablet Take 10 mg by mouth every morning. (Patient not taking: Reported on 05/11/2022)     insulin glargine (LANTUS SOLOSTAR) 100 UNIT/ML Solostar Pen Inject 10 Units into the skin daily. 15 mL 0   Insulin Pen Needle 32G X 4 MM MISC 1 each by Does not apply route 3 (three) times daily. 100 each 1   lisinopril (ZESTRIL) 40 MG tablet Take 40 mg by mouth daily.     metFORMIN (GLUCOPHAGE) 1000 MG tablet  Take 1 tablet (1,000 mg total) by mouth 2 (two) times daily. 60 tablet 1   thiamine 100 MG tablet Take 1 tablet (100 mg total) by mouth daily. 30 tablet 1   No current facility-administered medications for this visit.     Past Surgical History:  Procedure Laterality Date   AMPUTATION Right 02/26/2022   Procedure: AMPUTATION 5th TOE;  Surgeon: [name], MD;  Location: [facility];  Service: Orthopedics;  Laterality: Right;   ANTERIOR CERVICAL DECOMP/DISCECTOMY FUSION  11/09/2012   Procedure: ANTERIOR CERVICAL DECOMPRESSION/DISCECTOMY FUSION 3 LEVELS C4-C7 ;  Surgeon: Melina Schools, MD;  Location: [facility];  Service: Orthopedics;;  ACDF C4-C7      Allergies  Allergen Reactions   Ace Inhibitors Rash      No family history on file.   Social History Albert Mcdonald reports that he has never smoked. He has never used smokeless tobacco. Albert Mcdonald reports current alcohol use.   Review of Systems CONSTITUTIONAL: No weight loss, fever, chills, weakness or fatigue.  HEENT: Eyes: No visual loss, blurred vision, double vision or yellow sclerae.No hearing loss, sneezing, congestion, runny nose or sore throat.  SKIN: No rash or itching.  CARDIOVASCULAR: per hpi RESPIRATORY: No shortness of breath, cough or sputum.  GASTROINTESTINAL: No anorexia,  nausea, vomiting or diarrhea. No abdominal pain or blood.  GENITOURINARY: No burning on urination, no polyuria NEUROLOGICAL: No headache, dizziness, syncope, paralysis, ataxia, numbness or tingling in the extremities. No change in bowel or bladder control.  MUSCULOSKELETAL: No muscle, back pain, joint pain or stiffness.  LYMPHATICS: No enlarged nodes. No history of splenectomy.  PSYCHIATRIC: No history of depression or anxiety.  ENDOCRINOLOGIC: No reports of sweating, cold or heat intolerance. No polyuria or polydipsia.  Marland Kitchen   Physical Examination Today's Vitals   05/14/22 1031  BP: 124/82  Pulse: 88  SpO2: 95%  Weight: 213 lb 9.6 oz (96.9 kg)   Height: _0  (1.854 m)   Body mass index is 28.18 kg/m.  Gen: resting comfortably, no acute distress HEENT: no scleral icterus, pupils equal round and reactive, no palptable cervical adenopathy,  CV: RRR, no m/r/g no jvd Resp: Clear to auscultation bilaterally GI: abdomen is soft, non-tender, non-distended, normal bowel sounds, no hepatosplenomegaly MSK: extremities are warm, no edema.  Skin: warm, no rash Neuro:  no focal deficits Psych: appropriate affect      Assessment and Plan  Chest pain -unclear etiology. Multiple CAD risk factors including poorly contorlled DM2 with A1c 11.8 - plan for exercise nuclear stress test to further evaluate.       Albert Mcdonald, M.D.,

## 2022-05-14 NOTE — Patient Instructions (Signed)
Medication Instructions:  Your physician recommends that you continue on your current medications as directed. Please refer to the Current Medication list given to you today.   Labwork: None  Testing/Procedures: Your physician has requested that you have a Exercise Nuclear Stress Test . For further information please visit [email or web address]. Please follow instruction sheet, as given.   Follow-Up: Follow up pending test results.   Any Other Special Instructions Will Be Listed Below (If Applicable).     If you need a refill on your cardiac medications before your next appointment, please call your pharmacy.

## 2022-05-20 ENCOUNTER — Encounter (HOSPITAL_COMMUNITY): Payer: Self-pay

## 2022-05-20 ENCOUNTER — Encounter (HOSPITAL_COMMUNITY)
Admission: RE | Admit: 2022-05-20 | Discharge: 2022-05-20 | Disposition: A | Discharge status: Home / Self Care | Payer: Medicare Other | Source: Ambulatory Visit | Attending: Cardiology | Admitting: Cardiology

## 2022-05-20 ENCOUNTER — Ambulatory Visit (HOSPITAL_COMMUNITY)
Admission: RE | Admit: 2022-05-20 | Discharge: 2022-05-20 | Disposition: A | Discharge status: Home / Self Care | Payer: Medicare Other | Source: Ambulatory Visit | Attending: Cardiology | Admitting: Cardiology

## 2022-05-20 DIAGNOSIS — R079 Chest pain, unspecified: Secondary | ICD-10-CM | POA: Diagnosis present

## 2022-05-20 LAB — NM MYOCAR MULTI W/SPECT W/WALL MOTION / EF
Angina Index: 0
Duke Treadmill Score: 10
Estimated workload: 13.7
Exercise duration (min): 10 min
Exercise duration (sec): 21 s
LV dias vol: 94 mL (ref 62–150)
LV sys vol: 42 mL
MPHR: 170 {beats}/min
Nuc Stress EF: 55 %
Peak HR: 162 {beats}/min
Percent HR: 95 %
RATE: 0.3
RPE: 14
Rest HR: 72 {beats}/min
Rest Nuclear Isotope Dose: 11 mCi
SDS: 1
SRS: 1
SSS: 2
ST Depression (mm): 0 mm
Stress Nuclear Isotope Dose: 32.6 mCi
TID: 1.22

## 2022-05-20 MED ORDER — TECHNETIUM TC 99M TETROFOSMIN IV KIT
10.0000 | PACK | Freq: Once | INTRAVENOUS | Status: AC | PRN
  Administered 2022-05-20: 11 via INTRAVENOUS

## 2022-05-20 MED ORDER — REGADENOSON 0.4 MG/5ML IV SOLN
INTRAVENOUS | Status: AC
  Filled 2022-05-20: qty 5

## 2022-05-20 MED ORDER — SODIUM CHLORIDE FLUSH 0.9 % IV SOLN
INTRAVENOUS | Status: AC
  Administered 2022-05-20: 10 mL via INTRAVENOUS
  Filled 2022-05-20: qty 10

## 2022-05-20 MED ORDER — TECHNETIUM TC 99M TETROFOSMIN IV KIT
30.0000 | PACK | Freq: Once | INTRAVENOUS | Status: AC | PRN
  Administered 2022-05-20: 32.6 via INTRAVENOUS

## 2022-05-25 ENCOUNTER — Telehealth: Payer: Self-pay

## 2022-05-25 NOTE — Telephone Encounter (Signed)
Patient notified and verbalized understanding. Patient had no questions or concerns at this time. PCP copied 

## 2022-05-25 NOTE — Telephone Encounter (Signed)
-----   Message from Arnoldo Lenis, MD sent at 05/25/2022 10:51 AM EDT ----- Stress test overall looks good. Possibly very small minimal area of blockage on the bottom of the heart that is very small and not considered significnat risk. The test overall is very reassuring about his heart when all parameters are considered. Can f/u 3 months me or PA   Zandra Abts MD

## 2022-06-14 ENCOUNTER — Ambulatory Visit: Payer: Medicare Other | Admitting: Orthopedic Surgery

## 2022-07-01 ENCOUNTER — Encounter: Payer: Self-pay | Admitting: *Deleted

## 2022-07-11 ENCOUNTER — Other Ambulatory Visit: Payer: Self-pay

## 2022-07-11 ENCOUNTER — Encounter (HOSPITAL_COMMUNITY): Payer: Self-pay | Admitting: Emergency Medicine

## 2022-07-11 ENCOUNTER — Emergency Department (HOSPITAL_COMMUNITY)
Admission: EM | Admit: 2022-07-11 | Discharge: 2022-07-11 | Disposition: A | Discharge status: Home / Self Care | Payer: Medicare Other | Attending: Emergency Medicine | Admitting: Emergency Medicine

## 2022-07-11 DIAGNOSIS — E119 Type 2 diabetes mellitus without complications: Secondary | ICD-10-CM | POA: Diagnosis not present

## 2022-07-11 DIAGNOSIS — Z4800 Encounter for change or removal of nonsurgical wound dressing: Secondary | ICD-10-CM | POA: Insufficient documentation

## 2022-07-11 DIAGNOSIS — Z794 Long term (current) use of insulin: Secondary | ICD-10-CM | POA: Diagnosis not present

## 2022-07-11 DIAGNOSIS — Z5189 Encounter for other specified aftercare: Secondary | ICD-10-CM

## 2022-07-11 DIAGNOSIS — Z7984 Long term (current) use of oral hypoglycemic drugs: Secondary | ICD-10-CM | POA: Insufficient documentation

## 2022-07-11 LAB — CBG MONITORING, ED: Glucose-Capillary: 282 mg/dL — ABNORMAL HIGH (ref 70–99)

## 2022-07-11 MED ORDER — DOXYCYCLINE HYCLATE 100 MG PO TABS
100.0000 mg | ORAL_TABLET | Freq: Once | ORAL | Status: AC
Start: 2022-07-11 — End: 2022-07-11
  Administered 2022-07-11: 100 mg via ORAL
  Filled 2022-07-11: qty 1

## 2022-07-11 MED ORDER — DOXYCYCLINE HYCLATE 100 MG PO CAPS: 100.0000 mg | ORAL_CAPSULE | Freq: Two times a day (BID) | ORAL | 0 refills | Status: AC

## 2022-07-11 NOTE — Discharge Instructions (Signed)
Please follow-up with the podiatrist, the foot specialist, next week as scheduled in the office.  You have a blister that may be infected in your left toe.  If left untreated, this infection can go down to the bone and cause osteomyelitis and more serious infections.  It is very important that you keep the toe dry as possible.  You should wrap it with clean gauze every day, particularly if you are wearing work boots.  If the gauze is sticking when you try to remove it, you can put a little bit of clean water on it to help remove it.  You will take the antibiotic prescribed for the next 10 days, beginning tomorrow morning.  You were given a dose in the ER today.  If you notice redness streaking up your foot over the next day or 2, or you begin having fevers, or you see pus draining out of your toe, please return to the ER.  These may be signs of more serious infection

## 2022-07-11 NOTE — ED Provider Notes (Signed)
[facility] Provider Note   CSN: [id number] Arrival date & time: 07/11/22  1322     History  Chief Complaint  Patient presents with   Wound Check    Albert Mcdonald is a 51 y.o. male w/ hx of diabetes, right 5th toe removal, presented to ED with concern for blister and injury to his left distal third toe.  He reports that he wears work boots all day, and he sweats a lot in his shoes, and he noticed a blister at the bottom of his left toe which popped.  He is concerned that he may develop into an infection.  He denies fevers or chills.  He does have sensation in his feet, no significant neuropathy.  He has an appointment coming up next week with the podiatrist who performed the surgery on his right foot, with 1/5 toe removal due to osteomyelitis in June of this year, per my review of external records  HPI     Home Medications Prior to Admission medications   Medication Sig Start Date End Date Taking? Authorizing Provider  doxycycline (VIBRAMYCIN) 100 MG capsule Take 1 capsule (100 mg total) by mouth 2 (two) times daily for 10 days. 07/11/22 07/21/22 Yes Faten Frieson, Carola Rhine, MD  atorvastatin (LIPITOR) 20 MG tablet Take 20 mg by mouth daily. 02/23/22   [provider]  blood glucose meter kit and supplies Dispense based on patient and insurance preference. Use up to four times daily as directed. (FOR ICD-10 E10.9, E11.9). 02/27/22   Shelly Coss, MD  cyclobenzaprine (FLEXERIL) 10 MG tablet Take 10 mg by mouth 3 (three) times daily.    [provider]  FARXIGA 10 MG TABS tablet Take 10 mg by mouth every morning. 03/26/22   [provider]  insulin glargine (LANTUS SOLOSTAR) 100 UNIT/ML Solostar Pen Inject 10 Units into the skin daily. 02/27/22   Shelly Coss, MD  Insulin Pen Needle 32G X 4 MM MISC 1 each by Does not apply route 3 (three) times daily. 02/27/22   Shelly Coss, MD  lisinopril (ZESTRIL) 40 MG tablet Take 40 mg by mouth daily.  02/23/22   [provider]  metFORMIN (GLUCOPHAGE) 1000 MG tablet Take 1 tablet (1,000 mg total) by mouth 2 (two) times daily. 02/27/22   Shelly Coss, MD  thiamine 100 MG tablet Take 1 tablet (100 mg total) by mouth daily. 03/17/17   Damita Lack, MD      Allergies    Ace inhibitors    Review of Systems   Review of Systems  Physical Exam Updated Vital Signs BP (!) 152/94 (BP Location: Right Arm)   Pulse 90   Temp 98.2 F (36.8 C) (Oral)   Resp 18   Ht $R'6\' 1"'Ib$  (1.854 m)   Wt 95.3 kg   SpO2 100%   BMI 27.71 kg/m  Physical Exam Constitutional:      General: He is not in acute distress. HENT:     Head: Normocephalic and atraumatic.  Eyes:     Conjunctiva/sclera: Conjunctivae normal.     Pupils: Pupils are equal, round, and reactive to light.  Cardiovascular:     Rate and Rhythm: Normal rate and regular rhythm.  Pulmonary:     Effort: Pulmonary effort is normal. No respiratory distress.  Skin:    General: Skin is warm and dry.     Comments: Deroofed blister with exposed underlying tissue of the left distal third toe, without purulent drainage, no fusiform swelling of  the toe, tissue appears clean and pink  Neurological:     General: No focal deficit present.     Mental Status: He is alert. Mental status is at baseline.  Psychiatric:        Mood and Affect: Mood normal.        Behavior: Behavior normal.     ED Results / Procedures / Treatments   Labs (all labs ordered are listed, but only abnormal results are displayed) Labs Reviewed  CBG MONITORING, ED - Abnormal; Notable for the following components:      Result Value   Glucose-Capillary 282 (*)    All other components within normal limits    EKG None  Radiology No results found.  Procedures Procedures    Medications Ordered in ED Medications  doxycycline (VIBRA-TABS) tablet 100 mg (100 mg Oral Given 07/11/22 1422)    ED Course/ Medical Decision Making/ A&P                            Medical Decision Making Risk Prescription drug management.   Patient is here with a deroofed blister of the left third toe, underlying tissue appears clean, pain, I do not see overt signs of infection, no drainage from the toe.  The ulceration does not appear to burrow deep enough into the bone to raise immediate concern for osteomyelitis.  I doubt sepsis.  I do not see an indication for emergent MRI at this time.  I think is reasonable to start him on antibiotics for 10 days until he sees a podiatrist next week, scheduled, especially given his prior history of osteomyelitis on the other foot.  He is in agreement.  Doxycycline ordered for here.  We also talked about keeping his toes dry at work, as he does sweat in his boots all the time.  This could be contributing to putting him at risk for infection.  We provided clean Kerlix gauze which she can use to wrap his toes, and instructions for how to change the wrapping if it gets wet.        Final Clinical Impression(s) / ED Diagnoses Final diagnoses:  Visit for wound check    Rx / DC Orders ED Discharge Orders          Ordered    doxycycline (VIBRAMYCIN) 100 MG capsule  2 times daily        07/11/22 1421              Arabelle Bollig, Carola Rhine, MD 07/11/22 1424

## 2022-07-11 NOTE — ED Triage Notes (Signed)
Patient c/o wound to left tird toe. Per patient noticed blister on Thursday in which "busted" and he removed the skin and applied neosporin. Patient is diabetic. Per patient noticed heat, redness, and radiating pain today. Denies any fevers. Patient reports taking Advil at 4am today with some relief.

## 2022-07-21 ENCOUNTER — Other Ambulatory Visit (HOSPITAL_COMMUNITY): Payer: Self-pay | Admitting: Nurse Practitioner

## 2022-07-21 DIAGNOSIS — M869 Osteomyelitis, unspecified: Secondary | ICD-10-CM

## 2022-07-28 ENCOUNTER — Inpatient Hospital Stay (HOSPITAL_COMMUNITY)
Admission: EM | Admit: 2022-07-28 | Discharge: 2022-07-31 | DRG: 617 | Disposition: A | Discharge status: Home / Self Care | Payer: Medicare Other | Attending: Internal Medicine | Admitting: Internal Medicine

## 2022-07-28 ENCOUNTER — Ambulatory Visit (HOSPITAL_COMMUNITY)
Admission: RE | Admit: 2022-07-28 | Discharge: 2022-07-28 | Disposition: A | Discharge status: Home / Self Care | Payer: Medicare Other | Source: Ambulatory Visit | Attending: Nurse Practitioner | Admitting: Nurse Practitioner

## 2022-07-28 ENCOUNTER — Other Ambulatory Visit: Payer: Self-pay

## 2022-07-28 ENCOUNTER — Encounter (HOSPITAL_COMMUNITY): Payer: Self-pay | Admitting: Emergency Medicine

## 2022-07-28 DIAGNOSIS — L03116 Cellulitis of left lower limb: Secondary | ICD-10-CM | POA: Diagnosis present

## 2022-07-28 DIAGNOSIS — E1152 Type 2 diabetes mellitus with diabetic peripheral angiopathy with gangrene: Secondary | ICD-10-CM | POA: Diagnosis present

## 2022-07-28 DIAGNOSIS — Z89421 Acquired absence of other right toe(s): Secondary | ICD-10-CM | POA: Diagnosis not present

## 2022-07-28 DIAGNOSIS — Z794 Long term (current) use of insulin: Secondary | ICD-10-CM

## 2022-07-28 DIAGNOSIS — E782 Mixed hyperlipidemia: Secondary | ICD-10-CM | POA: Diagnosis present

## 2022-07-28 DIAGNOSIS — F418 Other specified anxiety disorders: Secondary | ICD-10-CM | POA: Diagnosis not present

## 2022-07-28 DIAGNOSIS — L03119 Cellulitis of unspecified part of limb: Secondary | ICD-10-CM

## 2022-07-28 DIAGNOSIS — Z79899 Other long term (current) drug therapy: Secondary | ICD-10-CM

## 2022-07-28 DIAGNOSIS — E1165 Type 2 diabetes mellitus with hyperglycemia: Secondary | ICD-10-CM | POA: Diagnosis present

## 2022-07-28 DIAGNOSIS — Z888 Allergy status to other drugs, medicaments and biological substances status: Secondary | ICD-10-CM

## 2022-07-28 DIAGNOSIS — M86172 Other acute osteomyelitis, left ankle and foot: Secondary | ICD-10-CM | POA: Diagnosis present

## 2022-07-28 DIAGNOSIS — M869 Osteomyelitis, unspecified: Secondary | ICD-10-CM | POA: Insufficient documentation

## 2022-07-28 DIAGNOSIS — E118 Type 2 diabetes mellitus with unspecified complications: Secondary | ICD-10-CM | POA: Diagnosis not present

## 2022-07-28 DIAGNOSIS — I10 Essential (primary) hypertension: Secondary | ICD-10-CM | POA: Diagnosis present

## 2022-07-28 DIAGNOSIS — Z7984 Long term (current) use of oral hypoglycemic drugs: Secondary | ICD-10-CM

## 2022-07-28 DIAGNOSIS — E11628 Type 2 diabetes mellitus with other skin complications: Secondary | ICD-10-CM | POA: Diagnosis not present

## 2022-07-28 DIAGNOSIS — G2581 Restless legs syndrome: Secondary | ICD-10-CM | POA: Diagnosis present

## 2022-07-28 DIAGNOSIS — L0390 Cellulitis, unspecified: Secondary | ICD-10-CM | POA: Diagnosis present

## 2022-07-28 DIAGNOSIS — G1221 Amyotrophic lateral sclerosis: Secondary | ICD-10-CM | POA: Diagnosis present

## 2022-07-28 DIAGNOSIS — E1169 Type 2 diabetes mellitus with other specified complication: Principal | ICD-10-CM | POA: Diagnosis present

## 2022-07-28 DIAGNOSIS — F429 Obsessive-compulsive disorder, unspecified: Secondary | ICD-10-CM | POA: Diagnosis present

## 2022-07-28 LAB — CBC WITH DIFFERENTIAL/PLATELET
Abs Immature Granulocytes: 0.05 10*3/uL (ref 0.00–0.07)
Basophils Absolute: 0 10*3/uL (ref 0.0–0.1)
Basophils Relative: 1 %
Eosinophils Absolute: 0.1 10*3/uL (ref 0.0–0.5)
Eosinophils Relative: 1 %
HCT: 44.7 % (ref 39.0–52.0)
Hemoglobin: 15.6 g/dL (ref 13.0–17.0)
Immature Granulocytes: 1 %
Lymphocytes Relative: 28 %
Lymphs Abs: 2.5 10*3/uL (ref 0.7–4.0)
MCH: 30.4 pg (ref 26.0–34.0)
MCHC: 34.9 g/dL (ref 30.0–36.0)
MCV: 87 fL (ref 80.0–100.0)
Monocytes Absolute: 0.7 10*3/uL (ref 0.1–1.0)
Monocytes Relative: 7 %
Neutro Abs: 5.6 10*3/uL (ref 1.7–7.7)
Neutrophils Relative %: 62 %
Platelets: 211 10*3/uL (ref 150–400)
RBC: 5.14 MIL/uL (ref 4.22–5.81)
RDW: 12.8 % (ref 11.5–15.5)
WBC: 8.9 10*3/uL (ref 4.0–10.5)
nRBC: 0 % (ref 0.0–0.2)

## 2022-07-28 LAB — COMPREHENSIVE METABOLIC PANEL
ALT: 20 U/L (ref 0–44)
AST: 19 U/L (ref 15–41)
Albumin: 4.5 g/dL (ref 3.5–5.0)
Alkaline Phosphatase: 119 U/L (ref 38–126)
Anion gap: 11 (ref 5–15)
BUN: 15 mg/dL (ref 6–20)
CO2: 25 mmol/L (ref 22–32)
Calcium: 9.7 mg/dL (ref 8.9–10.3)
Chloride: 102 mmol/L (ref 98–111)
Creatinine, Ser: 0.85 mg/dL (ref 0.61–1.24)
GFR, Estimated: >60 mL/min (ref >60)
Glucose, Bld: 307 mg/dL — ABNORMAL HIGH (ref 70–99)
Potassium: 4.5 mmol/L (ref 3.5–5.1)
Sodium: 138 mmol/L (ref 135–145)
Total Bilirubin: 0.5 mg/dL (ref 0.3–1.2)
Total Protein: 7.8 g/dL (ref 6.5–8.1)

## 2022-07-28 MED ORDER — VANCOMYCIN HCL IN DEXTROSE 1-5 GM/200ML-% IV SOLN
1000.0000 mg | Freq: Once | INTRAVENOUS | Status: AC
  Administered 2022-07-28: 1000 mg via INTRAVENOUS
  Filled 2022-07-28: qty 200

## 2022-07-28 MED ORDER — SODIUM CHLORIDE 0.9 % IV SOLN
2.0000 g | Freq: Once | INTRAVENOUS | Status: AC
  Administered 2022-07-28: 2 g via INTRAVENOUS
  Filled 2022-07-28: qty 12.5

## 2022-07-28 NOTE — ED Provider Notes (Addendum)
[facility] EMERGENCY DEPARTMENT Provider Note   CSN: [id number] Arrival date & time: 07/28/22  1843     History  Chief Complaint  Patient presents with   Wound Infection    Albert Mcdonald is a 51 y.o. male with Hx of OCD, ALS, [name], HTN, DMT2, hyperlipidemia and recent diagnosis of osteomyelitis.  Presenting to the ED today due to abnormal imaging results.  Recent x-ray of the left third toe suggestive of osteomyelitis.  Has had ulcers recent on the left foot that do not appear to be healing well per patient.  Now with decreased overall sensation to toes of the left foot, states however he still able to feel if he presses "really hard".  Denies fever, shortness of breath, chest pain, chills, N/V, or leg pain.  States complaints started with the middle toe, however now seems to be affecting the great toe and second toe.  Currently on clindamycin, however patient states it continues to worsen despite starting therapy on 07/21/2022.  Per review of records, admitted June 2023 for osteomyelitis of the fifth toe on the right foot, which was then amputated.  The history is provided by the patient and medical records.       Home Medications Prior to Admission medications   Medication Sig Start Date End Date Taking? Authorizing Provider  amLODipine (NORVASC) 10 MG tablet Take 10 mg by mouth daily. 06/23/22  Yes [provider]  atorvastatin (LIPITOR) 20 MG tablet Take 20 mg by mouth daily. 02/23/22  Yes [provider]  citalopram (CELEXA) 40 MG tablet Take 40 mg by mouth daily. 06/23/22  Yes [provider]  clindamycin (CLEOCIN) 300 MG capsule Take 300 mg by mouth 3 (three) times daily. 07/21/22  Yes [provider]  cyclobenzaprine (FLEXERIL) 10 MG tablet Take 10 mg by mouth 3 (three) times daily.   Yes [provider]  FARXIGA 10 MG TABS tablet Take 10 mg by mouth every morning. 03/26/22  Yes [provider]  insulin  glargine (LANTUS SOLOSTAR) 100 UNIT/ML Solostar Pen Inject 10 Units into the skin daily. 02/27/22  Yes Shelly Coss, MD  lisinopril (ZESTRIL) 40 MG tablet Take 40 mg by mouth daily. 02/23/22  Yes [provider]  metFORMIN (GLUCOPHAGE) 1000 MG tablet Take 1 tablet (1,000 mg total) by mouth 2 (two) times daily. 02/27/22  Yes Shelly Coss, MD  blood glucose meter kit and supplies Dispense based on patient and insurance preference. Use up to four times daily as directed. (FOR ICD-10 E10.9, E11.9). 02/27/22   Shelly Coss, MD  Insulin Pen Needle 32G X 4 MM MISC 1 each by Does not apply route 3 (three) times daily. 02/27/22   Shelly Coss, MD      Allergies    Ace inhibitors    Review of Systems   Review of Systems  Skin:  Positive for color change and wound.    Physical Exam Updated Vital Signs BP (!) 155/105 (BP Location: Left Arm)   Pulse 74   Temp 98.1 F (36.7 C) (Oral)   Resp 18   Ht _0  (1.854 m)   Wt 91.6 kg   SpO2 100%   BMI 26.65 kg/m  Physical Exam Vitals and nursing note reviewed.  Constitutional:      General: He is not in acute distress.    Appearance: He is well-developed.  HENT:     Head: Normocephalic and atraumatic.  Eyes:     Conjunctiva/sclera: Conjunctivae normal.  Cardiovascular:     Rate and Rhythm: Normal rate and regular rhythm.     Pulses: Normal pulses.     Heart sounds: No murmur heard.    Comments: DP and PT pulses 2+ bilaterally.  CRT less than 2 seconds of most of the left foot.  See photos. Pulmonary:     Effort: Pulmonary effort is normal. No respiratory distress.     Breath sounds: Normal breath sounds.  Abdominal:     Palpations: Abdomen is soft.     Tenderness: There is no abdominal tenderness.  Musculoskeletal:        General: No swelling.     Cervical back: Neck supple.  Skin:    General: Skin is warm and dry.     Capillary Refill: Capillary refill takes less than 2 seconds.     Comments: See photos.  Neurological:      Mental Status: He is alert and oriented to person, place, and time.     Comments: Decreased light sensation, however intact deep sensation appears intact to all toes on the left foot.  General sensation appears intact to general left foot.    Psychiatric:        Mood and Affect: Mood normal.            ED Results / Procedures / Treatments   Labs (all labs ordered are listed, but only abnormal results are displayed) Labs Reviewed  COMPREHENSIVE METABOLIC PANEL - Abnormal; Notable for the following components:      Result Value   Glucose, Bld 307 (*)    All other components within normal limits  CBC WITH DIFFERENTIAL/PLATELET    EKG None  Radiology DG Foot Complete Left  Result Date: 07/28/2022 CLINICAL DATA:  Osteomyelitis, wound at first and third toes for several weeks, has been on antibiotics EXAM: LEFT FOOT - COMPLETE 3+ VIEW COMPARISON:  02/27/2014 FINDINGS: Osseous mineralization normal. Joint spaces preserved. Bone destruction identified at tuft of distal phalanx third toe consistent with osteomyelitis. Associated soft tissue swelling. Additional soft tissue swelling great toe. No additional bone destruction seen however. No fracture or dislocation. Thickening of the distal Achilles tendon with calcifications consistent with prior Achilles tendon injury and or surgery. IMPRESSION: Osteomyelitis at distal phalanx of LEFT third toe with associated soft tissue swelling. No additional osseous abnormalities. These results will be called to the ordering clinician or representative by the Radiologist Assistant, and communication documented in the PACS or [employer]. Electronically Signed   By: Lavonia Dana M.D.   On: 07/28/2022 13:58    Procedures Procedures    Medications Ordered in ED Medications  vancomycin (VANCOCIN) IVPB 1000 mg/200 mL premix (1,000 mg Intravenous New Bag/Given 07/28/22 2124)  ceFEPIme (MAXIPIME) 2 g in sodium chloride 0.9 % 100 mL IVPB (0 g  Intravenous Stopped 07/28/22 2123)    ED Course/ Medical Decision Making/ A&P Clinical Course as of 07/28/22 2354  Wed Jul 28, 2022  2101 Consulted with Dr. Josephine Cables of hospitalist team.  Discussed patient's case in detail.  Agrees with plan for admission, however recommends consulting with orthopedics or podiatry prior to admission regarding patient's placement, [facility] Na versus [facility] for treatment.  Consultation placed for orthopedics. [AC]  2111 Consulted with Dr. Amedeo Kinsman of orthopedics.  Discussed patient's case in detail, he recommended utilization of general surgery consult for a case of this nature.  Consultation placed for general surgery. [AC]  2128 Consulted with Dr. Constance Haw of general surgery.  Discussed patient's case and  reviewed imaging.  Recommends ABI and MRI imaging tomorrow for further evaluation.  Plans to assess patient on rounds tomorrow morning.  Likely candidate for treatment here, however may consider transfer to [facility] dependent on whether ABI shows vascular involvement or not. [AC]  2242 Reconsulted with Dr. Josephine Cables, shared recommendations of Dr. Constance Haw.  ABI order placed.  Agrees with plan for admission. [AC]    Clinical Course User Index [AC] Prince Rome, PA-C                           Medical Decision Making Amount and/or Complexity of Data Reviewed Labs: ordered.  Risk Prescription drug management. Decision regarding hospitalization.   51 y.o. male presents to the ED for concern of No chief complaint on file.     This involves an extensive number of treatment options, and is a complaint that carries with it a high risk of complications and morbidity.  The emergent differential diagnosis prior to evaluation includes, but is not limited to: cellulitis, osteomyelitis  This is not an exhaustive differential.   Past Medical History / Co-morbidities / Social History: Hx of OCD, ALS, [name], HTN, DMT2, hyperlipidemia and recent  diagnosis of osteomyelitis Social Determinants of Health include: uncontrolled hyperglycemia  Additional History:  Obtained by chart review.  Notably recent ED visits and recent hospital admission for similar, see for details.  Lab Tests: I ordered, and personally interpreted labs.  The pertinent results include:   Glucose 307 CBC unremarkable  Imaging Studies: I ordered imaging studies including XR left foot.   I independently visualized and interpreted imaging which showed osteomyelitis of the distal third phalanx of the left middle toe I agree with the radiologist interpretation.  Cardiac Monitoring: The patient was maintained on a cardiac monitor.  I personally viewed and interpreted the cardiac monitored which showed an underlying rhythm of: NSR  ED Course / Critical Interventions: Pt well-appearing on exam.  Nontoxic, nonseptic appearing in NAD.  AAOx4.  Afebrile.  Presenting today with likely osteomyelitis of the left third toe.  Abnormal x-rays at PCP office, recommended to come to the ED for antibiotics and likely admission.  X-ray imaging today also depicts osteomyelitis of the distal phalanx of the left third toe.  On exam, great toe also questionable though without radiographic evidence on x-ray imaging suggesting osteomyelitis at this time.  Suspect this may show differently with MRI imaging.  Patient with decreased subjective sensation of all 5 toes, but most significantly of the middle, great, and second toe.  DP and PT pulses 2+ bilaterally.  Remaining left lower extremity appears neurovascularly intact.  CRT generally less than 2 seconds overall.  Hemodynamically stable.  Appears to have poorly controlled diabetes mellitus.  Per record review, was seen recently in the ED for an amputation of the little toe on the right foot rather for similar symptoms/presentation.  Anticipate admission with IV antibiotics and possible surgical intervention.  Patient started on cefepime and  vancomycin. Consulted with hospitalist team, orthopedics, and general surgery.  See notes above.  Plan to proceed with admission.  Disposition: Admission  I discussed this case with my attending, Dr. Roderic Palau, who agreed with the proposed treatment course and cosigned this note including patient's presenting symptoms, physical exam, and planned diagnostics and interventions.  Attending physician stated agreement with plan or made changes to plan which were implemented.     This chart was dictated using voice recognition software.  Despite best efforts to proofread, errors can occur which can change the documentation meaning.    Final Clinical Impression(s) / ED Diagnoses Final diagnoses:  Osteomyelitis of third toe of left foot ([place])  Type 2 diabetes mellitus with complication [facility])    Rx / DC Orders ED Discharge Orders     None        Candace Cruise 74/94/49 2354    Milton Ferguson, MD 07/30/22 1556

## 2022-07-28 NOTE — H&P (Incomplete)
History and Physical    Patient: Albert Mcdonald DOB: February 28, 1971 DOA: 07/28/2022 DOS: the patient was seen and examined on 07/28/2022 PCP: Leslie Andrea, MD  Patient coming from: Home  Chief Complaint: No chief complaint on file.  HPI: Albert Mcdonald is a 51 y.o. male with medical history significant of hypertension, T2DM, hyperlipidemia, substance abuse who presents to the emergency department due to abnormal left foot x-ray.  Patient had a recent x-ray of left foot that was suggestive of left third toe osteomyelitis, he was started on clindamycin 300 mg 3 times daily, he had a follow-up visit with his PCP today, patient's wound does not appear to be healing and due to suspicion for worsening osteomyelitis, he was asked to go to the ED for a left foot x-ray.  He denies fever, chills, headache, nausea, vomiting. Patient was admitted from 6/1 to 6/3 due to acute osteomyelitis of right fifth toe which was amputated during such admission.  ED Course:  In the emergency department, he was hemodynamically stable except for elevated BP at 183/116, all other vital signs were within normal range.  Work-up in the ED showed normal CBC and BMP except for CBG of 307. Left foot x-ray showed osteomyelitis at the distal phalanx of left third toe with associated soft tissue swelling. Patient was empirically treated with IV vancomycin and cefepime.  General surgery (Dr. Constance Haw) was consulted and recommended ABI and MRI of left foot with plan to follow-up on patient in the morning per ED PA.  Hospitalist was asked to admit patient for further evaluation and management.  Review of Systems: Review of systems as noted in the HPI. All other systems reviewed and are negative.   Past Medical History:  Diagnosis Date  . 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 Diabetes mellitus   . Family history of anesthesia complication   .  Hyperlipidemia   . Hypertension   . Kleine-Levin syndrome   . OCD (obsessive compulsive disorder)   . Restless leg    Past Surgical History:  Procedure Laterality Date  . AMPUTATION Right 02/26/2022   Procedure: AMPUTATION 5th TOE;  Surgeon: [name], MD;  Location: [place];  Service: Orthopedics;  Laterality: Right;  . ANTERIOR CERVICAL DECOMP/DISCECTOMY FUSION  11/09/2012   Procedure: ANTERIOR CERVICAL DECOMPRESSION/DISCECTOMY FUSION 3 LEVELS C4-C7 ;  Surgeon: Melina Schools, MD;  Location: [place];  Service: Orthopedics;;  ACDF C4-C7     Social History:  reports that he has never smoked. He has never used smokeless tobacco. He reports current alcohol use. He reports that he does not use drugs.   Allergies  Allergen Reactions  . Ace Inhibitors Rash    History reviewed. No pertinent family history.  ***  Prior to Admission medications   Medication Sig Start Date End Date Taking? Authorizing Provider  amLODipine (NORVASC) 10 MG tablet Take 10 mg by mouth daily. 06/23/22  Yes [provider]  atorvastatin (LIPITOR) 20 MG tablet Take 20 mg by mouth daily. 02/23/22  Yes [provider]  citalopram (CELEXA) 40 MG tablet Take 40 mg by mouth daily. 06/23/22  Yes [provider]  clindamycin (CLEOCIN) 300 MG capsule Take 300 mg by mouth 3 (three) times daily. 07/21/22  Yes [provider]  cyclobenzaprine (FLEXERIL) 10 MG tablet Take 10 mg by mouth 3 (three) times daily.   Yes [provider]  FARXIGA 10 MG TABS tablet  Take 10 mg by mouth every morning. 03/26/22  Yes [provider]  insulin glargine (LANTUS SOLOSTAR) 100 UNIT/ML Solostar Pen Inject 10 Units into the skin daily. 02/27/22  Yes Adhikari, Amrit, MD  lisinopril (ZESTRIL) 40 MG tablet Take 40 mg by mouth daily. 02/23/22  Yes [provider]  metFORMIN (GLUCOPHAGE) 1000 MG tablet Take 1 tablet (1,000 mg total) by mouth 2 (two) times daily. 02/27/22  Yes Adhikari, Amrit, MD   blood glucose meter kit and supplies Dispense based on patient and insurance preference. Use up to four times daily as directed. (FOR ICD-10 E10.9, E11.9). 02/27/22   Adhikari, Amrit, MD  Insulin Pen Needle 32G X 4 MM MISC 1 each by Does not apply route 3 (three) times daily. 02/27/22   Adhikari, Amrit, MD    Physical Exam: BP (!) 145/102   Pulse 69   Temp 97.9 F (36.6 C) (Oral)   Resp 17   Ht 6' 1" (1.854 m)   Wt 91.6 kg   SpO2 100%   BMI 26.65 kg/m   General: 51 y.o. year-old male well developed well nourished in no acute distress.  Alert and oriented x3. HEENT: NCAT, EOMI Neck: Supple, trachea medial Cardiovascular: Regular rate and rhythm with no rubs or gallops.  No thyromegaly or JVD noted.  No lower extremity edema. 2/4 pulses in all 4 extremities. Respiratory: Clear to auscultation with no wheezes or rales. Good inspiratory effort. Abdomen: Soft, nontender nondistended with normal bowel sounds x4 quadrants. Muskuloskeletal: No clubbing or edema noted bilaterally Neuro: CN II-XII intact, strength 5/5 x 4, sensation, reflexes intact Skin: Unkempt left toenails, noted infection of left third toe, black.eschar of tip of left great toe with superimposed erythema of left toes. Psychiatry: Judgement and insight appear normal. Mood is appropriate for condition and setting          Labs on Admission:  Basic Metabolic Panel: Recent Labs  Lab 07/28/22 1901  NA 138  K 4.5  CL 102  CO2 25  GLUCOSE 307*  BUN 15  CREATININE 0.85  CALCIUM 9.7   Liver Function Tests: Recent Labs  Lab 07/28/22 1901  AST 19  ALT 20  ALKPHOS 119  BILITOT 0.5  PROT 7.8  ALBUMIN 4.5   No results for input(s): "LIPASE", "AMYLASE" in the last 168 hours. No results for input(s): "AMMONIA" in the last 168 hours. CBC: Recent Labs  Lab 07/28/22 1901  WBC 8.9  NEUTROABS 5.6  HGB 15.6  HCT 44.7  MCV 87.0  PLT 211   Cardiac Enzymes: No results for input(s): "CKTOTAL", "CKMB", "CKMBINDEX",  "TROPONINI" in the last 168 hours.  BNP (last 3 results) No results for input(s): "BNP" in the last 8760 hours.  ProBNP (last 3 results) No results for input(s): "PROBNP" in the last 8760 hours.  CBG: No results for input(s): "GLUCAP" in the last 168 hours.  Radiological Exams on Admission: DG Foot Complete Left  Result Date: 07/28/2022 CLINICAL DATA:  Osteomyelitis, wound at first and third toes for several weeks, has been on antibiotics EXAM: LEFT FOOT - COMPLETE 3+ VIEW COMPARISON:  02/27/2014 FINDINGS: Osseous mineralization normal. Joint spaces preserved. Bone destruction identified at tuft of distal phalanx third toe consistent with osteomyelitis. Associated soft tissue swelling. Additional soft tissue swelling great toe. No additional bone destruction seen however. No fracture or dislocation. Thickening of the distal Achilles tendon with calcifications consistent with prior Achilles tendon injury and or surgery. IMPRESSION: Osteomyelitis at distal phalanx of LEFT third toe   with associated soft tissue swelling. No additional osseous abnormalities. These results will be called to the ordering clinician or representative by the Radiologist Assistant, and communication documented in the PACS or [employer]. Electronically Signed   By: Lavonia Dana M.D.   On: 07/28/2022 13:58    EKG: I independently viewed the EKG done and my findings are as followed: EKG was not done in the ED  Assessment/Plan Present on Admission: . Acute osteomyelitis of left foot (HCC)  Principal Problem:   Acute osteomyelitis of left foot (HCC)   Osteomyelitis of left third toe with possible superimposed cellulitis Left foot x-ray was suggestive of osteomyelitis of left third toe, patient was started on IV vancomycin and cefepime, we shall continue with same at this time Left lower extremity ABI pending MRI of left foot pending Continue Tylenol as needed Patient will be placed n.p.o. after midnight in  anticipation for possible surgical intervention General surgery (Dr. Constance Haw) was consulted and will follow-up with patient in the morning  T2DM with uncontrolled hyperglycemia Continue Semglee 10 units nightly and adjust dose accordingly Continue ISS and hypoglycemic protocol  Essential hypertension Mixed hyperlipidemia History of polysubstance abuse     DVT prophylaxis: ***   Code Status: ***   Family Communication: ***   Disposition Plan: ***   Consults called: ***   Admission status: ***     Bernadette Hoit MD [facility] Pager [phone]  If 7PM-7AM, please contact night-coverage [email or web address] Password [facility]  07/28/2022, 10:57 PM      Review of Systems: {ROS_Text:26778} Past Medical History:  Diagnosis Date  . ALS (amyotrophic lateral sclerosis) ([place])    patient denied on 11/01/12 -- has KLS not ALS  . Diabetes mellitus   . Family history of anesthesia complication   . Hyperlipidemia   . Hypertension   . Kleine-Levin syndrome   . OCD (obsessive compulsive disorder)   . Restless leg    Past Surgical History:  Procedure Laterality Date  . AMPUTATION Right 02/26/2022   Procedure: AMPUTATION 5th TOE;  Surgeon: [name], MD;  Location: [place];  Service: Orthopedics;  Laterality: Right;  . ANTERIOR CERVICAL DECOMP/DISCECTOMY FUSION  11/09/2012   Procedure: ANTERIOR CERVICAL DECOMPRESSION/DISCECTOMY FUSION 3 LEVELS C4-C7 ;  Surgeon: Melina Schools, MD;  Location: [place];  Service: Orthopedics;;  ACDF C4-C7    Social History:  reports that he has never smoked. He has never used smokeless tobacco. He reports current alcohol use. He reports that he does not use drugs.  Allergies  Allergen Reactions  . Ace Inhibitors Rash    History reviewed. No pertinent family history.  Prior to Admission medications   Medication Sig Start Date End Date Taking? Authorizing Provider  amLODipine (NORVASC) 10 MG tablet Take 10 mg by mouth daily. 06/23/22  Yes  [provider]  atorvastatin (LIPITOR) 20 MG tablet Take 20 mg by mouth daily. 02/23/22  Yes [provider]  citalopram (CELEXA) 40 MG tablet Take 40 mg by mouth daily. 06/23/22  Yes [provider]  clindamycin (CLEOCIN) 300 MG capsule Take 300 mg by mouth 3 (three) times daily. 07/21/22  Yes [provider]  cyclobenzaprine (FLEXERIL) 10 MG tablet Take 10 mg by mouth 3 (three) times daily.   Yes [provider]  FARXIGA 10 MG TABS tablet Take 10 mg by mouth every morning. 03/26/22  Yes [provider]  insulin glargine (LANTUS SOLOSTAR) 100 UNIT/ML Solostar Pen Inject 10 Units into the skin daily. 02/27/22  Yes  Shelly Coss, MD  lisinopril (ZESTRIL) 40 MG tablet Take 40 mg by mouth daily. 02/23/22  Yes [provider]  metFORMIN (GLUCOPHAGE) 1000 MG tablet Take 1 tablet (1,000 mg total) by mouth 2 (two) times daily. 02/27/22  Yes Shelly Coss, MD  blood glucose meter kit and supplies Dispense based on patient and insurance preference. Use up to four times daily as directed. (FOR ICD-10 E10.9, E11.9). 02/27/22   Shelly Coss, MD  Insulin Pen Needle 32G X 4 MM MISC 1 each by Does not apply route 3 (three) times daily. 02/27/22   Shelly Coss, MD    Physical Exam: Vitals:   07/28/22 2128 07/28/22 2129 07/28/22 2200 07/28/22 2230  BP:   (!) 147/95 (!) 145/102  Pulse: 71 74 70 69  Resp:   17   Temp:   97.9 F (36.6 C)   TempSrc:   Oral   SpO2: 99% 100% 100% 100%  Weight:      Height:       *** Data Reviewed: {Tip this will not be part of the note when signed- Document your independent interpretation of telemetry tracing, EKG, lab, Radiology test or any other diagnostic tests. Add any new diagnostic test ordered today. (Optional):26781} {Results:26384}  Assessment and Plan: No notes have been filed under this hospital service. Service: Hospitalist     Advance Care Planning:   Code Status: Prior ***  Consults:  ***  Family Communication: ***  Severity of Illness: {Observation/Inpatient:21159}  Author: Bernadette Hoit, DO 07/28/2022 10:55 PM  For on call review [email or web address].

## 2022-07-28 NOTE — ED Triage Notes (Signed)
Seen pcp and was sent  for abnormal results. Pt states had xray on left third toe today and sent here for that. Pt has ulcers on left foot. A/o. ambulatory

## 2022-07-28 NOTE — H&P (Signed)
History and Physical    Patient: Albert Mcdonald KCL:[id number] DOB: April 08, 1971 DOA: 07/28/2022 DOS: the patient was seen and examined on 07/29/2022 PCP: Leslie Andrea, MD  Patient coming from: Home  Chief Complaint:  Chief Complaint  Patient presents with   Wound Infection   HPI: Albert Mcdonald is a 51 y.o. male with medical history significant of hypertension, T2DM, hyperlipidemia, substance abuse who presents to the emergency department due to abnormal left foot x-ray.  Patient had a recent x-ray of left foot that was suggestive of left third toe osteomyelitis, he was started on clindamycin 300 mg 3 times daily, he had a follow-up visit with his PCP today, patient's wound does not appear to be healing and due to suspicion for worsening osteomyelitis, he was asked to go to the ED for a left foot x-ray.  He denies fever, chills, headache, nausea, vomiting. Patient was recently admitted from 6/1 to 6/3 due to acute osteomyelitis of right fifth toe which was amputated during such admission.  ED Course:  In the emergency department, he was hemodynamically stable except for elevated BP at 183/116, all other vital signs were within normal range.  Work-up in the ED showed normal CBC and BMP except for CBG of 307. Left foot x-ray showed osteomyelitis at the distal phalanx of left third toe with associated soft tissue swelling. Patient was empirically treated with IV vancomycin and cefepime.  General surgery (Dr. Constance Haw) was consulted and recommended ABI and MRI of left foot with plan to follow-up on patient in the morning per ED PA.  Hospitalist was asked to admit patient for further evaluation and management.  Review of Systems: Review of systems as noted in the HPI. All other systems reviewed and are negative.   Past Medical History:  Diagnosis Date   ALS (amyotrophic lateral sclerosis) ([place])    patient denied on 11/01/12 -- has KLS not ALS   Diabetes mellitus    Family history of  anesthesia complication    Hyperlipidemia    Hypertension    Kleine-Levin syndrome    OCD (obsessive compulsive disorder)    Restless leg    Past Surgical History:  Procedure Laterality Date   AMPUTATION Right 02/26/2022   Procedure: AMPUTATION 5th TOE;  Surgeon: [name], MD;  Location: [place];  Service: Orthopedics;  Laterality: Right;   ANTERIOR CERVICAL DECOMP/DISCECTOMY FUSION  11/09/2012   Procedure: ANTERIOR CERVICAL DECOMPRESSION/DISCECTOMY FUSION 3 LEVELS C4-C7 ;  Surgeon: Melina Schools, MD;  Location: [place];  Service: Orthopedics;;  ACDF C4-C7     Social History:  reports that he has never smoked. He has never used smokeless tobacco. He reports current alcohol use. He reports that he does not use drugs.   Allergies  Allergen Reactions   Ace Inhibitors Rash    History reviewed. No pertinent family history.   Prior to Admission medications   Medication Sig Start Date End Date Taking? Authorizing Provider  amLODipine (NORVASC) 10 MG tablet Take 10 mg by mouth daily. 06/23/22  Yes [provider]  atorvastatin (LIPITOR) 20 MG tablet Take 20 mg by mouth daily. 02/23/22  Yes [provider]  citalopram (CELEXA) 40 MG tablet Take 40 mg by mouth daily. 06/23/22  Yes [provider]  clindamycin (CLEOCIN) 300 MG capsule Take 300 mg by mouth 3 (three) times daily. 07/21/22  Yes [provider]  cyclobenzaprine (FLEXERIL) 10 MG tablet Take 10 mg by mouth 3 (three) times daily.   Yes [provider]  FARXIGA 10 MG TABS tablet Take 10 mg by mouth every morning. 03/26/22  Yes [provider]  insulin glargine (LANTUS SOLOSTAR) 100 UNIT/ML Solostar Pen Inject 10 Units into the skin daily. 02/27/22  Yes Shelly Coss, MD  lisinopril (ZESTRIL) 40 MG tablet Take 40 mg by mouth daily. 02/23/22  Yes [provider]  metFORMIN (GLUCOPHAGE) 1000 MG tablet Take 1 tablet (1,000 mg total) by mouth 2 (two) times daily. 02/27/22  Yes  Shelly Coss, MD  blood glucose meter kit and supplies Dispense based on patient and insurance preference. Use up to four times daily as directed. (FOR ICD-10 E10.9, E11.9). 02/27/22   Shelly Coss, MD  Insulin Pen Needle 32G X 4 MM MISC 1 each by Does not apply route 3 (three) times daily. 02/27/22   Shelly Coss, MD    Physical Exam: BP (!) 142/97 (BP Location: Left Arm)   Pulse 70   Temp 97.7 F (36.5 C) (Oral)   Resp 17   Ht 6' 1" (1.854 m)   Wt 94.2 kg   SpO2 99%   BMI 27.40 kg/m   General: 51 y.o. year-old male well developed well nourished in no acute distress.  Alert and oriented x3. HEENT: NCAT, EOMI Neck: Supple, trachea medial Cardiovascular: Regular rate and rhythm with no rubs or gallops.  No thyromegaly or JVD noted.  No lower extremity edema. 2/4 pulses in all 4 extremities. Respiratory: Clear to auscultation with no wheezes or rales. Good inspiratory effort. Abdomen: Soft, nontender nondistended with normal bowel sounds x4 quadrants. Muskuloskeletal: No clubbing or edema noted bilaterally Neuro: CN II-XII intact, strength 5/5 x 4, sensation, reflexes intact Skin: Unkempt left toenails, noted infection of left third toe, black.eschar of tip of left great toe with superimposed erythema of left toes. Psychiatry: Judgement and insight appear normal. Mood is appropriate for condition and setting               Labs on Admission:  Basic Metabolic Panel: Recent Labs  Lab 07/28/22 1901  NA 138  K 4.5  CL 102  CO2 25  GLUCOSE 307*  BUN 15  CREATININE 0.85  CALCIUM 9.7   Liver Function Tests: Recent Labs  Lab 07/28/22 1901  AST 19  ALT 20  ALKPHOS 119  BILITOT 0.5  PROT 7.8  ALBUMIN 4.5   No results for input(s): "LIPASE", "AMYLASE" in the last 168 hours. No results for input(s): "AMMONIA" in the last 168 hours. CBC: Recent Labs  Lab 07/28/22 1901  WBC 8.9  NEUTROABS 5.6  HGB 15.6  HCT 44.7  MCV 87.0  PLT 211   Cardiac Enzymes: No  results for input(s): "CKTOTAL", "CKMB", "CKMBINDEX", "TROPONINI" in the last 168 hours.  BNP (last 3 results) No results for input(s): "BNP" in the last 8760 hours.  ProBNP (last 3 results) No results for input(s): "PROBNP" in the last 8760 hours.  CBG: No results for input(s): "GLUCAP" in the last 168 hours.  Radiological Exams on Admission: DG Foot Complete Left  Result Date: 07/28/2022 CLINICAL DATA:  Osteomyelitis, wound at first and third toes for several weeks, has been on antibiotics EXAM: LEFT FOOT - COMPLETE 3+ VIEW COMPARISON:  02/27/2014 FINDINGS: Osseous mineralization normal. Joint spaces preserved. Bone destruction identified at tuft of distal phalanx third toe consistent with osteomyelitis. Associated soft tissue swelling. Additional soft tissue swelling great toe. No additional bone destruction seen however. No fracture or dislocation. Thickening of the distal Achilles tendon with calcifications consistent with  prior Achilles tendon injury and or surgery. IMPRESSION: Osteomyelitis at distal phalanx of LEFT third toe with associated soft tissue swelling. No additional osseous abnormalities. These results will be called to the ordering clinician or representative by the Radiologist Assistant, and communication documented in the PACS or [employer]. Electronically Signed   By: Lavonia Dana M.D.   On: 07/28/2022 13:58    EKG: I independently viewed the EKG done and my findings are as followed: EKG was not done in the ED  Assessment/Plan Present on Admission:  Acute osteomyelitis of left foot ([place])  Essential hypertension  Cellulitis  Mixed hyperlipidemia  Principal Problem:   Acute osteomyelitis of left foot ([place]) Active Problems:   Uncontrolled type 2 diabetes mellitus with hyperglycemia, with long-term current use of insulin ([facility])   Mixed hyperlipidemia   Essential hypertension   Cellulitis   Osteomyelitis of left third toe with possible superimposed  cellulitis Left foot x-ray was suggestive of osteomyelitis of left third toe, patient was started on IV vancomycin and cefepime, we shall continue with same at this time Left lower extremity ABI pending MRI of left foot pending Continue Tylenol as needed Patient will be placed n.p.o. after midnight in anticipation for possible surgical intervention General surgery (Dr. Constance Haw) was consulted and will follow-up with patient in the morning  T2DM with uncontrolled hyperglycemia Continue Semglee 10 units nightly and adjust dose accordingly Continue ISS and hypoglycemic protocol  Essential hypertension Continue amlodipine  Mixed hyperlipidemia Continue Lipitor  History of polysubstance abuse Patient denies current use of any recreational drug   DVT prophylaxis: SCDs  Code Status: Full code  Consults: General surgery  Family Communication: None at bedside  Severity of Illness: The appropriate patient status for this patient is INPATIENT. Inpatient status is judged to be reasonable and necessary in order to provide the required intensity of service to ensure the patient's safety. The patient's presenting symptoms, physical exam findings, and initial radiographic and laboratory data in the context of their chronic comorbidities is felt to place them at high risk for further clinical deterioration. Furthermore, it is not anticipated that the patient will be medically stable for discharge from the hospital within 2 midnights of admission.   * I certify that at the point of admission it is my clinical judgment that the patient will require inpatient hospital care spanning beyond 2 midnights from the point of admission due to high intensity of service, high risk for further deterioration and high frequency of surveillance required.*  Author: Bernadette Hoit, DO 07/29/2022 12:29 AM  For on call review [email or web address].

## 2022-07-29 ENCOUNTER — Inpatient Hospital Stay (HOSPITAL_COMMUNITY): Payer: Medicare Other

## 2022-07-29 DIAGNOSIS — E118 Type 2 diabetes mellitus with unspecified complications: Secondary | ICD-10-CM

## 2022-07-29 DIAGNOSIS — M86172 Other acute osteomyelitis, left ankle and foot: Secondary | ICD-10-CM | POA: Diagnosis not present

## 2022-07-29 LAB — CBC
HCT: 42.9 % (ref 39.0–52.0)
Hemoglobin: 14.9 g/dL (ref 13.0–17.0)
MCH: 29.4 pg (ref 26.0–34.0)
MCHC: 34.7 g/dL (ref 30.0–36.0)
MCV: 84.8 fL (ref 80.0–100.0)
Platelets: 190 10*3/uL (ref 150–400)
RBC: 5.06 MIL/uL (ref 4.22–5.81)
RDW: 12.9 % (ref 11.5–15.5)
WBC: 6.6 10*3/uL (ref 4.0–10.5)
nRBC: 0 % (ref 0.0–0.2)

## 2022-07-29 LAB — COMPREHENSIVE METABOLIC PANEL
ALT: 18 U/L (ref 0–44)
AST: 16 U/L (ref 15–41)
Albumin: 3.9 g/dL (ref 3.5–5.0)
Alkaline Phosphatase: 98 U/L (ref 38–126)
Anion gap: 8 (ref 5–15)
BUN: 16 mg/dL (ref 6–20)
CO2: 27 mmol/L (ref 22–32)
Calcium: 9 mg/dL (ref 8.9–10.3)
Chloride: 102 mmol/L (ref 98–111)
Creatinine, Ser: 0.61 mg/dL (ref 0.61–1.24)
GFR, Estimated: >60 mL/min (ref >60)
Glucose, Bld: 192 mg/dL — ABNORMAL HIGH (ref 70–99)
Potassium: 3.7 mmol/L (ref 3.5–5.1)
Sodium: 137 mmol/L (ref 135–145)
Total Bilirubin: 0.7 mg/dL (ref 0.3–1.2)
Total Protein: 7 g/dL (ref 6.5–8.1)

## 2022-07-29 LAB — C-REACTIVE PROTEIN: CRP: 0.8 mg/dL (ref <1.0)

## 2022-07-29 LAB — GLUCOSE, CAPILLARY
Glucose-Capillary: 239 mg/dL — ABNORMAL HIGH (ref 70–99)
Glucose-Capillary: 118 mg/dL — ABNORMAL HIGH (ref 70–99)

## 2022-07-29 LAB — MAGNESIUM: Magnesium: 2.2 mg/dL (ref 1.7–2.4)

## 2022-07-29 LAB — SURGICAL PCR SCREEN
MRSA, PCR: NEGATIVE
Staphylococcus aureus: NEGATIVE

## 2022-07-29 LAB — PHOSPHORUS: Phosphorus: 2.8 mg/dL (ref 2.5–4.6)

## 2022-07-29 LAB — SEDIMENTATION RATE: Sed Rate: 9 mm/hr (ref 0–16)

## 2022-07-29 MED ORDER — ONDANSETRON HCL 4 MG PO TABS: 4.0000 mg | ORAL_TABLET | Freq: Four times a day (QID) | ORAL | Status: DC | PRN

## 2022-07-29 MED ORDER — SODIUM CHLORIDE 0.9 % IV SOLN
2.0000 g | Freq: Three times a day (TID) | INTRAVENOUS | Status: DC
  Administered 2022-07-29: 2 g via INTRAVENOUS
  Filled 2022-07-29: qty 12.5

## 2022-07-29 MED ORDER — INSULIN GLARGINE-YFGN 100 UNIT/ML ~~LOC~~ SOLN
10.0000 [IU] | Freq: Every day | SUBCUTANEOUS | Status: DC
  Administered 2022-07-29 – 2022-07-30 (×2): 10 [IU] via SUBCUTANEOUS
  Filled 2022-07-29 (×3): qty 0.1

## 2022-07-29 MED ORDER — ONDANSETRON HCL 4 MG/2ML IJ SOLN: 4.0000 mg | Freq: Four times a day (QID) | INTRAMUSCULAR | Status: DC | PRN

## 2022-07-29 MED ORDER — ACETAMINOPHEN 650 MG RE SUPP: 650.0000 mg | Freq: Four times a day (QID) | RECTAL | Status: DC | PRN

## 2022-07-29 MED ORDER — VANCOMYCIN HCL 1500 MG/300ML IV SOLN
1500.0000 mg | Freq: Two times a day (BID) | INTRAVENOUS | Status: DC
  Administered 2022-07-29 – 2022-07-31 (×5): 1500 mg via INTRAVENOUS
  Filled 2022-07-29 (×5): qty 300

## 2022-07-29 MED ORDER — CHLORHEXIDINE GLUCONATE CLOTH 2 % EX PADS
6.0000 | MEDICATED_PAD | Freq: Once | CUTANEOUS | Status: AC
  Administered 2022-07-29: 6 via TOPICAL

## 2022-07-29 MED ORDER — SODIUM CHLORIDE 0.9 % IV SOLN
2.0000 g | INTRAVENOUS | Status: DC
  Administered 2022-07-29 – 2022-07-31 (×3): 2 g via INTRAVENOUS
  Filled 2022-07-29 (×3): qty 20

## 2022-07-29 MED ORDER — INSULIN ASPART 100 UNIT/ML IJ SOLN
0.0000 [IU] | Freq: Every day | INTRAMUSCULAR | Status: DC
  Administered 2022-07-29: 2 [IU] via SUBCUTANEOUS
  Administered 2022-07-29: 3 [IU] via SUBCUTANEOUS
  Administered 2022-07-30: 2 [IU] via SUBCUTANEOUS

## 2022-07-29 MED ORDER — ATORVASTATIN CALCIUM 20 MG PO TABS
20.0000 mg | ORAL_TABLET | Freq: Every day | ORAL | Status: DC
  Administered 2022-07-29 – 2022-07-31 (×3): 20 mg via ORAL
  Filled 2022-07-29 (×3): qty 1

## 2022-07-29 MED ORDER — ACETAMINOPHEN 325 MG PO TABS
650.0000 mg | ORAL_TABLET | Freq: Four times a day (QID) | ORAL | Status: DC | PRN
  Administered 2022-07-29 – 2022-07-30 (×3): 650 mg via ORAL
  Filled 2022-07-29 (×3): qty 2

## 2022-07-29 MED ORDER — AMLODIPINE BESYLATE 5 MG PO TABS
10.0000 mg | ORAL_TABLET | Freq: Every day | ORAL | Status: DC
  Administered 2022-07-29 – 2022-07-31 (×3): 10 mg via ORAL
  Filled 2022-07-29 (×3): qty 2

## 2022-07-29 MED ORDER — DAPAGLIFLOZIN PROPANEDIOL 10 MG PO TABS
10.0000 mg | ORAL_TABLET | Freq: Every day | ORAL | Status: DC
  Administered 2022-07-29 – 2022-07-31 (×3): 10 mg via ORAL
  Filled 2022-07-29 (×3): qty 1

## 2022-07-29 MED ORDER — INSULIN ASPART 100 UNIT/ML IJ SOLN
0.0000 [IU] | Freq: Three times a day (TID) | INTRAMUSCULAR | Status: DC
  Administered 2022-07-29: 3 [IU] via SUBCUTANEOUS
  Administered 2022-07-29: 5 [IU] via SUBCUTANEOUS
  Administered 2022-07-30: 3 [IU] via SUBCUTANEOUS
  Administered 2022-07-31: 2 [IU] via SUBCUTANEOUS
  Administered 2022-07-31: 3 [IU] via SUBCUTANEOUS

## 2022-07-29 NOTE — Assessment & Plan Note (Addendum)
02/25/2022 hemoglobin A1c 11.8 Start Semglee 10 units daily>.increased to 15 NovoLog sliding scale Restart metformin at [employer] -will likely need short acting insulin during day He has appointment with endocrine on 09/01/22

## 2022-07-29 NOTE — TOC Progression Note (Signed)
  Transition of Care [facility]) Screening Note   Patient Details  Name: Albert Mcdonald Date of Birth: 1970-12-14   Transition of Care [facility]) CM/SW Contact:    Shade Flood, LCSW Phone Number: 07/29/2022, 11:49 AM    Transition of Care Department [facility]) has reviewed patient and no TOC needs have been identified at this time. We will continue to monitor patient advancement through interdisciplinary progression rounds. If new patient transition needs arise, please place a TOC consult.

## 2022-07-29 NOTE — H&P (View-Only) (Signed)
I was present with the medical student for this service. I personally verified the history of present illness, performed the physical exam, and made the plan for this encounter. I have verified the medical student's documentation and made modifications where appropriately. I have personally documented in my own words a brief history, physical, and plan below.      Reason for Consult: Dr. Dorise Bullion Referring Physician: Nash Dimmer is an 51 y.o. male with a past medical history of HTN, T2DM and HLD.  HPI: Patient reports his left third toe problem started 1 month ago. He was cutting his toenails and cut too deep. He also wore some work boots that caused a blister to form on his left third toe. He has tried soaking his feet with Epsom salt. He denies fever, pain, or chills.   He went to the ED on 10/15 for the blister and at the time there was no concern for osteomyelitis and they discharged him on Doxycycline 100 mg BID x10 days. He was seen by PCP on 10/25 and started on Clindamycin '300mg'$  TID x10 days. He went to his PCP yesterday who sent him to the ED for worsening osteomyelitis. In the ED, WBC was normal, left foot XR showed osteomyelitis at distal phalanx of the left third toe. He was started on IV Vancomycin and Cefepime.   Today, patient reports no pain of the toe. He is able to walk without a problem. He denies fever, chills, nausea, or vomiting. He endorses sensation to his feet and tingling. He had a prior amputation of his right fifth toe by Dr. Sharol Given due to osteomyelitis in June.  Past Medical History:  Diagnosis Date   ALS (amyotrophic lateral sclerosis) ([place])    patient denied on 11/01/12 -- has KLS not ALS   Diabetes mellitus    Family history of anesthesia complication    Hyperlipidemia    Hypertension    Kleine-Levin syndrome    OCD (obsessive compulsive disorder)    Restless leg     Past Surgical History:  Procedure Laterality Date   AMPUTATION Right  02/26/2022   Procedure: AMPUTATION 5th TOE;  Surgeon: [name], MD;  Location: [place];  Service: Orthopedics;  Laterality: Right;   ANTERIOR CERVICAL DECOMP/DISCECTOMY FUSION  11/09/2012   Procedure: ANTERIOR CERVICAL DECOMPRESSION/DISCECTOMY FUSION 3 LEVELS C4-C7 ;  Surgeon: Melina Schools, MD;  Location: [place];  Service: Orthopedics;;  ACDF C4-C7     History reviewed. No pertinent family history.  Social History:  reports that he has never smoked. He has never used smokeless tobacco. He reports current alcohol use. He reports that he does not use drugs.  Allergies:  Allergies  Allergen Reactions   Ace Inhibitors Rash    Medications: I have reviewed the patient's current medications. Current Facility-Administered Medications  Medication Dose Route Frequency Provider Last Rate Last Admin   acetaminophen (TYLENOL) tablet 650 mg  650 mg Oral Q6H PRN Adefeso, Oladapo, DO   650 mg at 07/29/22 0055   Or   acetaminophen (TYLENOL) suppository 650 mg  650 mg Rectal Q6H PRN Adefeso, Oladapo, DO       amLODipine (NORVASC) tablet 10 mg  10 mg Oral Daily Adefeso, Oladapo, DO   10 mg at 07/29/22 0931   atorvastatin (LIPITOR) tablet 20 mg  20 mg Oral Daily Adefeso, Oladapo, DO   20 mg at 07/29/22 0932   cefTRIAXone (ROCEPHIN) 2 g in sodium chloride 0.9 % 100 mL IVPB  2 g Intravenous Q24H Tat, Shanon Brow, MD 200 mL/hr at 07/29/22 0942 2 g at 07/29/22 0942   dapagliflozin propanediol (FARXIGA) tablet 10 mg  10 mg Oral Daily Tat, David, MD   10 mg at 07/29/22 0941   insulin aspart (novoLOG) injection 0-15 Units  0-15 Units Subcutaneous TID WC Adefeso, Oladapo, DO   5 Units at 07/29/22 0932   insulin aspart (novoLOG) injection 0-5 Units  0-5 Units Subcutaneous QHS Adefeso, Oladapo, DO   2 Units at 07/29/22 0100   insulin glargine-yfgn (SEMGLEE) injection 10 Units  10 Units Subcutaneous QHS Adefeso, Oladapo, DO       ondansetron (ZOFRAN) tablet 4 mg  4 mg Oral Q6H PRN Adefeso, Oladapo, DO       Or    ondansetron (ZOFRAN) injection 4 mg  4 mg Intravenous Q6H PRN Adefeso, Oladapo, DO       vancomycin (VANCOREADY) IVPB 1500 mg/300 mL  1,500 mg Intravenous Q12H Adefeso, Oladapo, DO 150 mL/hr at 07/29/22 0722 1,500 mg at 07/29/22 1914    Results for orders placed or performed during the hospital encounter of 07/28/22 (from the past 48 hour(s))  CBC with Differential     Status: None   Collection Time: 07/28/22  7:01 PM  Result Value Ref Range   WBC 8.9 4.0 - 10.5 K/uL   RBC 5.14 4.22 - 5.81 MIL/uL   Hemoglobin 15.6 13.0 - 17.0 g/dL   HCT 44.7 39.0 - 52.0 %   MCV 87.0 80.0 - 100.0 fL   MCH 30.4 26.0 - 34.0 pg   MCHC 34.9 30.0 - 36.0 g/dL   RDW 12.8 11.5 - 15.5 %   Platelets 211 150 - 400 K/uL   nRBC 0.0 0.0 - 0.2 %   Neutrophils Relative % 62 %   Neutro Abs 5.6 1.7 - 7.7 K/uL   Lymphocytes Relative 28 %   Lymphs Abs 2.5 0.7 - 4.0 K/uL   Monocytes Relative 7 %   Monocytes Absolute 0.7 0.1 - 1.0 K/uL   Eosinophils Relative 1 %   Eosinophils Absolute 0.1 0.0 - 0.5 K/uL   Basophils Relative 1 %   Basophils Absolute 0.0 0.0 - 0.1 K/uL   Immature Granulocytes 1 %   Abs Immature Granulocytes 0.05 0.00 - 0.07 K/uL    Comment: Performed at [facility], [address] [place], [place] [address]  Comprehensive metabolic panel     Status: Abnormal   Collection Time: 07/28/22  7:01 PM  Result Value Ref Range   Sodium 138 135 - 145 mmol/L   Potassium 4.5 3.5 - 5.1 mmol/L   Chloride 102 98 - 111 mmol/L   CO2 25 22 - 32 mmol/L   Glucose, Bld 307 (H) 70 - 99 mg/dL    Comment: Glucose reference range applies only to samples taken after fasting for at least 8 hours.   BUN 15 6 - 20 mg/dL   Creatinine, Ser 0.85 0.61 - 1.24 mg/dL   Calcium 9.7 8.9 - 10.3 mg/dL   Total Protein 7.8 6.5 - 8.1 g/dL   Albumin 4.5 3.5 - 5.0 g/dL   AST 19 15 - 41 U/L   ALT 20 0 - 44 U/L   Alkaline Phosphatase 119 38 - 126 U/L   Total Bilirubin 0.5 0.3 - 1.2 mg/dL   GFR, Estimated >60 >60 mL/min    Comment:  (NOTE) Calculated using the CKD-EPI Creatinine Equation (2021)    Anion gap 11 5 - 15    Comment:  Performed at [facility], [address], [place], [place] [address]  Glucose, capillary     Status: Abnormal   Collection Time: 07/29/22 12:57 AM  Result Value Ref Range   Glucose-Capillary 239 (H) 70 - 99 mg/dL    Comment: Glucose reference range applies only to samples taken after fasting for at least 8 hours.  Comprehensive metabolic panel     Status: Abnormal   Collection Time: 07/29/22  4:11 AM  Result Value Ref Range   Sodium 137 135 - 145 mmol/L   Potassium 3.7 3.5 - 5.1 mmol/L    Comment: DELTA CHECK NOTED   Chloride 102 98 - 111 mmol/L   CO2 27 22 - 32 mmol/L   Glucose, Bld 192 (H) 70 - 99 mg/dL    Comment: Glucose reference range applies only to samples taken after fasting for at least 8 hours.   BUN 16 6 - 20 mg/dL   Creatinine, Ser 0.61 0.61 - 1.24 mg/dL   Calcium 9.0 8.9 - 10.3 mg/dL   Total Protein 7.0 6.5 - 8.1 g/dL   Albumin 3.9 3.5 - 5.0 g/dL   AST 16 15 - 41 U/L   ALT 18 0 - 44 U/L   Alkaline Phosphatase 98 38 - 126 U/L   Total Bilirubin 0.7 0.3 - 1.2 mg/dL   GFR, Estimated >60 >60 mL/min    Comment: (NOTE) Calculated using the CKD-EPI Creatinine Equation (2021)    Anion gap 8 5 - 15    Comment: Performed at [facility] [address], [place], [place] [address]  CBC     Status: None   Collection Time: 07/29/22  4:11 AM  Result Value Ref Range   WBC 6.6 4.0 - 10.5 K/uL   RBC 5.06 4.22 - 5.81 MIL/uL   Hemoglobin 14.9 13.0 - 17.0 g/dL   HCT 42.9 39.0 - 52.0 %   MCV 84.8 80.0 - 100.0 fL   MCH 29.4 26.0 - 34.0 pg   MCHC 34.7 30.0 - 36.0 g/dL   RDW 12.9 11.5 - 15.5 %   Platelets 190 150 - 400 K/uL   nRBC 0.0 0.0 - 0.2 %    Comment: Performed at [facility], [address], [place], [place] [address]  Magnesium     Status: None   Collection Time: 07/29/22  4:11 AM  Result Value Ref Range   Magnesium 2.2 1.7 - 2.4 mg/dL    Comment: Performed at [facility], [address], [place], [place] [address]  Phosphorus     Status: None   Collection Time: 07/29/22  4:11 AM  Result Value Ref Range   Phosphorus 2.8 2.5 - 4.6 mg/dL    Comment: Performed at [facility], [address], [place], [place] [address]  C-reactive protein     Status: None   Collection Time: 07/29/22  4:11 AM  Result Value Ref Range   CRP 0.8 <1.0 mg/dL    Comment: Performed at [place] [address], [place], [place] [address]  Sedimentation rate     Status: None   Collection Time: 07/29/22  9:16 AM  Result Value Ref Range   Sed Rate 9 0 - 16 mm/hr    Comment: Performed at [facility], [address] [place],  [address]  Glucose, capillary     Status: Abnormal   Collection Time: 07/29/22 11:35 AM  Result Value Ref Range   Glucose-Capillary 118 (H) 70 - 99 mg/dL    Comment:  Glucose reference range applies only to samples taken after fasting for at least 8 hours.    US ARTERIAL ABI (SCREENING LOWER EXTREMITY)  Result Date: 07/29/2022 CLINICAL DATA:  Left toe osteomyelitis EXAM: NONINVASIVE PHYSIOLOGIC VASCULAR STUDY OF BILATERAL LOWER EXTREMITIES TECHNIQUE: Evaluation of both lower extremities were performed at rest, including calculation of ankle-brachial indices with single level Doppler, pressure and pulse volume recording. COMPARISON:  10/22/2020 FINDINGS: Right ABI:  1.47 (previously 1.31) Left ABI:  1.39 (previously 1.31) Right Lower Extremity:  Multiphasic arterial waveforms at the ankle. Left Lower Extremity:  Multiphasic arterial waveforms at the ankle. IMPRESSION: No evidence of hemodynamically significant lower extremity arterial occlusive disease at rest. Electronically Signed   By: Lucrezia Europe M.D.   On: 07/29/2022 12:48   MR FOOT LEFT WO CONTRAST  Result Date: 07/29/2022 CLINICAL DATA:  Foot swelling, diabetic, osteomyelitis suspected, xray done EXAM: MRI OF THE LEFT FOOT WITHOUT CONTRAST TECHNIQUE: Multiplanar, multisequence MR imaging of the  left forefoot was performed. No intravenous contrast was administered. COMPARISON:  X-ray 07/28/2022 FINDINGS: Bones/Joint/Cartilage Acute osteomyelitis of the distal phalanx of the left third toe with bone destruction of the distal tuft. Marked bone marrow edema and confluent low T1 signal changes within the residual distal phalanx. Subtle bone marrow edema within the middle phalanx of the third toe with preservation of the fatty T1 marrow signal. Remaining bony structures of the forefoot demonstrate normal signal. No fracture or dislocation. Ligaments Intact Lisfranc ligament.  Intact collateral ligaments. Muscles and Tendons Increased T2 signal within the intrinsic foot musculature which may reflect denervation or myositis. Intact flexor and extensor tendons. No tenosynovitis. Soft tissues Prominent soft tissue swelling and edema of the third toe. No deep soft tissue ulceration. No organized fluid collection. IMPRESSION: 1. Acute osteomyelitis of the distal phalanx of the left third toe. 2. Subtle bone marrow edema within the middle phalanx of the third toe may reflect reactive osteitis versus early acute osteomyelitis. 3. Prominent soft tissue swelling and edema of the third toe. No organized fluid collection. Electronically Signed   By: Davina Poke D.O.   On: 07/29/2022 11:40   DG Foot Complete Left  Result Date: 07/28/2022 CLINICAL DATA:  Osteomyelitis, wound at first and third toes for several weeks, has been on antibiotics EXAM: LEFT FOOT - COMPLETE 3+ VIEW COMPARISON:  02/27/2014 FINDINGS: Osseous mineralization normal. Joint spaces preserved. Bone destruction identified at tuft of distal phalanx third toe consistent with osteomyelitis. Associated soft tissue swelling. Additional soft tissue swelling great toe. No additional bone destruction seen however. No fracture or dislocation. Thickening of the distal Achilles tendon with calcifications consistent with prior Achilles tendon injury and or  surgery. IMPRESSION: Osteomyelitis at distal phalanx of LEFT third toe with associated soft tissue swelling. No additional osseous abnormalities. These results will be called to the ordering clinician or representative by the Radiologist Assistant, and communication documented in the PACS or [employer]. Electronically Signed   By: Lavonia Dana M.D.   On: 07/28/2022 13:58    ROS:  A comprehensive review of systems was negative.  Blood pressure 137/86, pulse 72, temperature 98.7 F (37.1 C), resp. rate 18, height '6\' 1"'$  (1.854 m), weight 94.2 kg, SpO2 98 %. Physical Exam:  Gen: Well appearing in no distress Pulm: Normal work of breathing. No wheezing or crackles. CV: Normal rate. No murmurs. Normal S1 and S2. Extr: Left great toe with dry gangrene on tip, Left third toe with mild swelling, callous, and small ulcer. Mild tenderness  to palpation. Sensation to touch in tact. Posterior tibial pulse +2 bilaterally. Dorsalis pedis pulses +2 right, +1 left.   Assessment/Plan: Osteomyelitis of left foot -MRI with acute osteomyelitis of third toe to distal phalanx with concern of osteomyelitis for middle phalanx. -ABI: Right 1.47, Left 1.39. No occlusive disease. No need for vascular intervention at this time. -Continue antibiotics per primary team: Ceftriaxone and Vancomycin -Patient amenable to left third toe amputation of distal and medial phalanx given risks of infection spreading. Surgery scheduled for tomorrow 11/3. Discussed risks of surgery including infection and bleeding.  FEN/GI -NPO after midnight.  Leonette [place], MS3 07/29/2022, 1:30 PM

## 2022-07-29 NOTE — Assessment & Plan Note (Addendum)
07/28/2022--plain x-ray shows osteomyelitis distal phalanx left third toe Check ESR 9 Check CRP--0.8 Continue empiric vancomycin during hospitalization Switched cefepime to ceftriaxone Patient has a good posterior tibial pulse and dorsalis pedis pulse General surgery consult>>left third toe amputation 11/3 -d/c home with doxy and levoflox x 4 more days to treat soft tissue infection component (cellulitis) of foot

## 2022-07-29 NOTE — Progress Notes (Signed)
Patient admitted to [address] at midnight. Patient has been NPO. On and off this shift. Tylenol was given at 0055 for mild pain to foot. No further complaints. Continued to monitor.

## 2022-07-29 NOTE — Progress Notes (Signed)
Pharmacy Antibiotic Note  Albert Mcdonald is a 51 y.o. male admitted on 07/28/2022 with osteomyelitis.  Pharmacy has been consulted for Vancomycin and Cefepime  dosing.  Vancomycin 1 g IV given in ED at 2130 on 11/1  Plan: Vancomycin 1500 mg IV q12h Cefepime 2 g IV q8h  Height: '6\' 1"'$  (185.4 cm) Weight: 94.2 kg (207 lb 10.8 oz) IBW/kg (Calculated) : 79.9  Temp (24hrs), Avg:97.9 F (36.6 C), Min:97.7 F (36.5 C), Max:98.1 F (36.7 C)  Recent Labs  Lab 07/28/22 1901  WBC 8.9  CREATININE 0.85    Estimated Creatinine Clearance: 116.2 mL/min (by C-G formula based on SCr of 0.85 mg/dL).    Allergies  Allergen Reactions   Ace Inhibitors Rash      Caryl Pina 07/29/2022 12:28 AM

## 2022-07-29 NOTE — Assessment & Plan Note (Signed)
Continue statin. 

## 2022-07-29 NOTE — Assessment & Plan Note (Signed)
-   Continue amlodipine ?

## 2022-07-29 NOTE — Hospital Course (Signed)
51 year old male with a history of hypertension, ADD, diabetes mellitus type 2, hyperlipidemia presenting with left third toe infection and abnormal x-ray of the left foot.  The patient is a difficult historian at best.  Review the medical record shows that he visited the emergency department on 07/11/2022 for his left third toe.  He was sent home with doxycycline.  He stated that he took 10 days of the antibiotics.  He followed up with his PCP who ordered an x-ray of his left foot.  The x-ray showed osteomyelitis of the distal phalanx of the left third toe with soft tissue edema.  He was directed to the emergency department for further evaluation and treatment.  The patient states that he does not particularly have pain in his left foot but he did notice some swelling of the left third toe about 2 to 3 weeks ago.  He denies any fevers, chills, nausea, vomiting, diarrhea, chest pain, shortness breath. In the ED, the patient was afebrile and hemodynamically stable with oxygen saturation 99% room air.  WBC 8.9, hemoglobin 15.6, platelets 211,000.  LFTs were unremarkable.  The patient was started on vancomycin and cefepime.

## 2022-07-29 NOTE — Consult Note (Addendum)
I was present with the medical student for this service. I personally verified the history of present illness, performed the physical exam, and made the plan for this encounter. I have verified the medical student's documentation and made modifications where appropriately. I have personally documented in my own words a brief history, physical, and plan below.      Reason for Consult: Dr. Dorise Bullion Referring Physician: Nash Dimmer is an 51 y.o. male with a past medical history of HTN, T2DM and HLD.  HPI: Patient reports his left third toe problem started 1 month ago. He was cutting his toenails and cut too deep. He also wore some work boots that caused a blister to form on his left third toe. He has tried soaking his feet with Epsom salt. He denies fever, pain, or chills.   He went to the ED on 10/15 for the blister and at the time there was no concern for osteomyelitis and they discharged him on Doxycycline 100 mg BID x10 days. He was seen by PCP on 10/25 and started on Clindamycin '300mg'$  TID x10 days. He went to his PCP yesterday who sent him to the ED for worsening osteomyelitis. In the ED, WBC was normal, left foot XR showed osteomyelitis at distal phalanx of the left third toe. He was started on IV Vancomycin and Cefepime.   Today, patient reports no pain of the toe. He is able to walk without a problem. He denies fever, chills, nausea, or vomiting. He endorses sensation to his feet and tingling. He had a prior amputation of his right fifth toe by Dr. Sharol Given due to osteomyelitis in June.  Past Medical History:  Diagnosis Date   ALS (amyotrophic lateral sclerosis) ([place])    patient denied on 11/01/12 -- has KLS not ALS   Diabetes mellitus    Family history of anesthesia complication    Hyperlipidemia    Hypertension    Kleine-Levin syndrome    OCD (obsessive compulsive disorder)    Restless leg     Past Surgical History:  Procedure Laterality Date   AMPUTATION Right  02/26/2022   Procedure: AMPUTATION 5th TOE;  Surgeon: [name], MD;  Location: [facility];  Service: Orthopedics;  Laterality: Right;   ANTERIOR CERVICAL DECOMP/DISCECTOMY FUSION  11/09/2012   Procedure: ANTERIOR CERVICAL DECOMPRESSION/DISCECTOMY FUSION 3 LEVELS C4-C7 ;  Surgeon: Melina Schools, MD;  Location: [facility];  Service: Orthopedics;;  ACDF C4-C7     History reviewed. No pertinent family history.  Social History:  reports that he has never smoked. He has never used smokeless tobacco. He reports current alcohol use. He reports that he does not use drugs.  Allergies:  Allergies  Allergen Reactions   Ace Inhibitors Rash    Medications: I have reviewed the patient's current medications. Current Facility-Administered Medications  Medication Dose Route Frequency Provider Last Rate Last Admin   acetaminophen (TYLENOL) tablet 650 mg  650 mg Oral Q6H PRN Adefeso, Oladapo, DO   650 mg at 07/29/22 0055   Or   acetaminophen (TYLENOL) suppository 650 mg  650 mg Rectal Q6H PRN Adefeso, Oladapo, DO       amLODipine (NORVASC) tablet 10 mg  10 mg Oral Daily Adefeso, Oladapo, DO   10 mg at 07/29/22 0931   atorvastatin (LIPITOR) tablet 20 mg  20 mg Oral Daily Adefeso, Oladapo, DO   20 mg at 07/29/22 0932   cefTRIAXone (ROCEPHIN) 2 g in sodium chloride 0.9 % 100 mL IVPB  2 g Intravenous Q24H Tat, Shanon Brow, MD 200 mL/hr at 07/29/22 0942 2 g at 07/29/22 0942   dapagliflozin propanediol (FARXIGA) tablet 10 mg  10 mg Oral Daily Tat, David, MD   10 mg at 07/29/22 0941   insulin aspart (novoLOG) injection 0-15 Units  0-15 Units Subcutaneous TID WC Adefeso, Oladapo, DO   5 Units at 07/29/22 0932   insulin aspart (novoLOG) injection 0-5 Units  0-5 Units Subcutaneous QHS Adefeso, Oladapo, DO   2 Units at 07/29/22 0100   insulin glargine-yfgn (SEMGLEE) injection 10 Units  10 Units Subcutaneous QHS Adefeso, Oladapo, DO       ondansetron (ZOFRAN) tablet 4 mg  4 mg Oral Q6H PRN Adefeso, Oladapo, DO       Or    ondansetron (ZOFRAN) injection 4 mg  4 mg Intravenous Q6H PRN Adefeso, Oladapo, DO       vancomycin (VANCOREADY) IVPB 1500 mg/300 mL  1,500 mg Intravenous Q12H Adefeso, Oladapo, DO 150 mL/hr at 07/29/22 0722 1,500 mg at 07/29/22 1017    Results for orders placed or performed during the hospital encounter of 07/28/22 (from the past 48 hour(s))  CBC with Differential     Status: None   Collection Time: 07/28/22  7:01 PM  Result Value Ref Range   WBC 8.9 4.0 - 10.5 K/uL   RBC 5.14 4.22 - 5.81 MIL/uL   Hemoglobin 15.6 13.0 - 17.0 g/dL   HCT 44.7 39.0 - 52.0 %   MCV 87.0 80.0 - 100.0 fL   MCH 30.4 26.0 - 34.0 pg   MCHC 34.9 30.0 - 36.0 g/dL   RDW 12.8 11.5 - 15.5 %   Platelets 211 150 - 400 K/uL   nRBC 0.0 0.0 - 0.2 %   Neutrophils Relative % 62 %   Neutro Abs 5.6 1.7 - 7.7 K/uL   Lymphocytes Relative 28 %   Lymphs Abs 2.5 0.7 - 4.0 K/uL   Monocytes Relative 7 %   Monocytes Absolute 0.7 0.1 - 1.0 K/uL   Eosinophils Relative 1 %   Eosinophils Absolute 0.1 0.0 - 0.5 K/uL   Basophils Relative 1 %   Basophils Absolute 0.0 0.0 - 0.1 K/uL   Immature Granulocytes 1 %   Abs Immature Granulocytes 0.05 0.00 - 0.07 K/uL    Comment: Performed at [facility], [address], [place], [place] [address]  Comprehensive metabolic panel     Status: Abnormal   Collection Time: 07/28/22  7:01 PM  Result Value Ref Range   Sodium 138 135 - 145 mmol/L   Potassium 4.5 3.5 - 5.1 mmol/L   Chloride 102 98 - 111 mmol/L   CO2 25 22 - 32 mmol/L   Glucose, Bld 307 (H) 70 - 99 mg/dL    Comment: Glucose reference range applies only to samples taken after fasting for at least 8 hours.   BUN 15 6 - 20 mg/dL   Creatinine, Ser 0.85 0.61 - 1.24 mg/dL   Calcium 9.7 8.9 - 10.3 mg/dL   Total Protein 7.8 6.5 - 8.1 g/dL   Albumin 4.5 3.5 - 5.0 g/dL   AST 19 15 - 41 U/L   ALT 20 0 - 44 U/L   Alkaline Phosphatase 119 38 - 126 U/L   Total Bilirubin 0.5 0.3 - 1.2 mg/dL   GFR, Estimated >60 >60 mL/min    Comment:  (NOTE) Calculated using the CKD-EPI Creatinine Equation (2021)    Anion gap 11 5 - 15    Comment:  Performed at [facility], [address], [place], [place] [address]  Glucose, capillary     Status: Abnormal   Collection Time: 07/29/22 12:57 AM  Result Value Ref Range   Glucose-Capillary 239 (H) 70 - 99 mg/dL    Comment: Glucose reference range applies only to samples taken after fasting for at least 8 hours.  Comprehensive metabolic panel     Status: Abnormal   Collection Time: 07/29/22  4:11 AM  Result Value Ref Range   Sodium 137 135 - 145 mmol/L   Potassium 3.7 3.5 - 5.1 mmol/L    Comment: DELTA CHECK NOTED   Chloride 102 98 - 111 mmol/L   CO2 27 22 - 32 mmol/L   Glucose, Bld 192 (H) 70 - 99 mg/dL    Comment: Glucose reference range applies only to samples taken after fasting for at least 8 hours.   BUN 16 6 - 20 mg/dL   Creatinine, Ser 0.61 0.61 - 1.24 mg/dL   Calcium 9.0 8.9 - 10.3 mg/dL   Total Protein 7.0 6.5 - 8.1 g/dL   Albumin 3.9 3.5 - 5.0 g/dL   AST 16 15 - 41 U/L   ALT 18 0 - 44 U/L   Alkaline Phosphatase 98 38 - 126 U/L   Total Bilirubin 0.7 0.3 - 1.2 mg/dL   GFR, Estimated >60 >60 mL/min    Comment: (NOTE) Calculated using the CKD-EPI Creatinine Equation (2021)    Anion gap 8 5 - 15    Comment: Performed at [facility], [address] [place], [place] [address]  CBC     Status: None   Collection Time: 07/29/22  4:11 AM  Result Value Ref Range   WBC 6.6 4.0 - 10.5 K/uL   RBC 5.06 4.22 - 5.81 MIL/uL   Hemoglobin 14.9 13.0 - 17.0 g/dL   HCT 42.9 39.0 - 52.0 %   MCV 84.8 80.0 - 100.0 fL   MCH 29.4 26.0 - 34.0 pg   MCHC 34.7 30.0 - 36.0 g/dL   RDW 12.9 11.5 - 15.5 %   Platelets 190 150 - 400 K/uL   nRBC 0.0 0.0 - 0.2 %    Comment: Performed at [facility], [address], [place], [place] [address]  Magnesium     Status: None   Collection Time: 07/29/22  4:11 AM  Result Value Ref Range   Magnesium 2.2 1.7 - 2.4 mg/dL    Comment: Performed at [facility], [address], [place], [place] [address]  Phosphorus     Status: None   Collection Time: 07/29/22  4:11 AM  Result Value Ref Range   Phosphorus 2.8 2.5 - 4.6 mg/dL    Comment: Performed at [facility], [address] [place], [place] [address]  C-reactive protein     Status: None   Collection Time: 07/29/22  4:11 AM  Result Value Ref Range   CRP 0.8 <1.0 mg/dL    Comment: Performed at [facility] [address], [place], [place] [address]  Sedimentation rate     Status: None   Collection Time: 07/29/22  9:16 AM  Result Value Ref Range   Sed Rate 9 0 - 16 mm/hr    Comment: Performed at [facility], [address], [place], [place] [address]  Glucose, capillary     Status: Abnormal   Collection Time: 07/29/22 11:35 AM  Result Value Ref Range   Glucose-Capillary 118 (H) 70 - 99 mg/dL    Comment:  Glucose reference range applies only to samples taken after fasting for at least 8 hours.    US ARTERIAL ABI (SCREENING LOWER EXTREMITY)  Result Date: 07/29/2022 CLINICAL DATA:  Left toe osteomyelitis EXAM: NONINVASIVE PHYSIOLOGIC VASCULAR STUDY OF BILATERAL LOWER EXTREMITIES TECHNIQUE: Evaluation of both lower extremities were performed at rest, including calculation of ankle-brachial indices with single level Doppler, pressure and pulse volume recording. COMPARISON:  10/22/2020 FINDINGS: Right ABI:  1.47 (previously 1.31) Left ABI:  1.39 (previously 1.31) Right Lower Extremity:  Multiphasic arterial waveforms at the ankle. Left Lower Extremity:  Multiphasic arterial waveforms at the ankle. IMPRESSION: No evidence of hemodynamically significant lower extremity arterial occlusive disease at rest. Electronically Signed   By: Lucrezia Europe M.D.   On: 07/29/2022 12:48   MR FOOT LEFT WO CONTRAST  Result Date: 07/29/2022 CLINICAL DATA:  Foot swelling, diabetic, osteomyelitis suspected, xray done EXAM: MRI OF THE LEFT FOOT WITHOUT CONTRAST TECHNIQUE: Multiplanar, multisequence MR imaging of the  left forefoot was performed. No intravenous contrast was administered. COMPARISON:  X-ray 07/28/2022 FINDINGS: Bones/Joint/Cartilage Acute osteomyelitis of the distal phalanx of the left third toe with bone destruction of the distal tuft. Marked bone marrow edema and confluent low T1 signal changes within the residual distal phalanx. Subtle bone marrow edema within the middle phalanx of the third toe with preservation of the fatty T1 marrow signal. Remaining bony structures of the forefoot demonstrate normal signal. No fracture or dislocation. Ligaments Intact Lisfranc ligament.  Intact collateral ligaments. Muscles and Tendons Increased T2 signal within the intrinsic foot musculature which may reflect denervation or myositis. Intact flexor and extensor tendons. No tenosynovitis. Soft tissues Prominent soft tissue swelling and edema of the third toe. No deep soft tissue ulceration. No organized fluid collection. IMPRESSION: 1. Acute osteomyelitis of the distal phalanx of the left third toe. 2. Subtle bone marrow edema within the middle phalanx of the third toe may reflect reactive osteitis versus early acute osteomyelitis. 3. Prominent soft tissue swelling and edema of the third toe. No organized fluid collection. Electronically Signed   By: Davina Poke D.O.   On: 07/29/2022 11:40   DG Foot Complete Left  Result Date: 07/28/2022 CLINICAL DATA:  Osteomyelitis, wound at first and third toes for several weeks, has been on antibiotics EXAM: LEFT FOOT - COMPLETE 3+ VIEW COMPARISON:  02/27/2014 FINDINGS: Osseous mineralization normal. Joint spaces preserved. Bone destruction identified at tuft of distal phalanx third toe consistent with osteomyelitis. Associated soft tissue swelling. Additional soft tissue swelling great toe. No additional bone destruction seen however. No fracture or dislocation. Thickening of the distal Achilles tendon with calcifications consistent with prior Achilles tendon injury and or  surgery. IMPRESSION: Osteomyelitis at distal phalanx of LEFT third toe with associated soft tissue swelling. No additional osseous abnormalities. These results will be called to the ordering clinician or representative by the Radiologist Assistant, and communication documented in the PACS or [employer]. Electronically Signed   By: Lavonia Dana M.D.   On: 07/28/2022 13:58    ROS:  A comprehensive review of systems was negative.  Blood pressure 137/86, pulse 72, temperature 98.7 F (37.1 C), resp. rate 18, height '6\' 1"'$  (1.854 m), weight 94.2 kg, SpO2 98 %. Physical Exam:  Gen: Well appearing in no distress Pulm: Normal work of breathing. No wheezing or crackles. CV: Normal rate. No murmurs. Normal S1 and S2. Extr: Left great toe with dry gangrene on tip, Left third toe with mild swelling, callous, and small ulcer. Mild tenderness  to palpation. Sensation to touch in tact. Posterior tibial pulse +2 bilaterally. Dorsalis pedis pulses +2 right, +1 left.   Assessment/Plan: Osteomyelitis of left foot -MRI with acute osteomyelitis of third toe to distal phalanx with concern of osteomyelitis for middle phalanx. -ABI: Right 1.47, Left 1.39. No occlusive disease. No need for vascular intervention at this time. -Continue antibiotics per primary team: Ceftriaxone and Vancomycin -Patient amenable to left third toe amputation of distal and medial phalanx given risks of infection spreading. Surgery scheduled for tomorrow 11/3. Discussed risks of surgery including infection and bleeding.  FEN/GI -NPO after midnight.  Leonette Nutting, MS3 07/29/2022, 1:30 PM

## 2022-07-29 NOTE — Progress Notes (Signed)
PROGRESS NOTE  Albert Mcdonald PZW:[id number] DOB: 09-Mar-1971 DOA: 07/28/2022 PCP: Leslie Andrea, MD  Brief History:  51 year old male with a history of hypertension, ADD, diabetes mellitus type 2, hyperlipidemia presenting with left third toe infection and abnormal x-ray of the left foot.  The patient is a difficult historian at best.  Review the medical record shows that he visited the emergency department on 07/11/2022 for his left third toe.  He was sent home with doxycycline.  He stated that he took 10 days of the antibiotics.  He followed up with his PCP who ordered an x-ray of his left foot.  The x-ray showed osteomyelitis of the distal phalanx of the left third toe with soft tissue edema.  He was directed to the emergency department for further evaluation and treatment.  The patient states that he does not particularly have pain in his left foot but he did notice some swelling of the left third toe about 2 to 3 weeks ago.  He denies any fevers, chills, nausea, vomiting, diarrhea, chest pain, shortness breath. In the ED, the patient was afebrile and hemodynamically stable with oxygen saturation 99% room air.  WBC 8.9, hemoglobin 15.6, platelets 211,000.  LFTs were unremarkable.  The patient was started on vancomycin and cefepime.     Assessment and Plan: * Acute osteomyelitis of left foot ([place]) 07/28/2022--plain x-ray shows osteomyelitis distal phalanx left third toe Check ESR Check CRP Continue empiric vancomycin Switch cefepime to ceftriaxone Patient has a good posterior tibial pulse and dorsalis pedis pulse Follow-up ABI General surgery consult  Essential hypertension Continue amlodipine  Mixed hyperlipidemia Continue statin  Uncontrolled type 2 diabetes mellitus with hyperglycemia, with long-term current use of insulin ([place]) 02/25/2022 hemoglobin A1c 11.8 Start Semglee 10 units daily NovoLog sliding scale Holding metformin Restart Farxiga       Family  Communication:  no Family at bedside  Consultants:  gen surgery  Code Status:  FULL  DVT Prophylaxis:  [place] Heparin   Procedures: As Listed in Progress Note Above  Antibiotics: Vanc 11/1>> Ceftriaxone 11/2>.     Subjective: Patient denies fevers, chills, headache, chest pain, dyspnea, nausea, vomiting, diarrhea, abdominal pain, dysuria, hematuria, hematochezia, and melena.   Objective: Vitals:   07/28/22 2230 07/28/22 2300 07/29/22 0006 07/29/22 0542  BP: (!) 145/102 (!) 128/91 (!) 142/97 (!) 136/92  Pulse: 69 68 70 69  Resp:  _0 Temp:  97.9 F (36.6 C) 97.7 F (36.5 C) 98.2 F (36.8 C)  TempSrc:  Oral Oral Oral  SpO2: 100% 98% 99% 98%  Weight:   94.2 kg   Height:   _1  (1.854 m)     Intake/Output Summary (Last 24 hours) at 07/29/2022 4235 Last data filed at 07/29/2022 0600 Gross per 24 hour  Intake 45.28 ml  Output --  Net 45.28 ml   Weight change:  Exam:  General:  Pt is alert, follows commands appropriately, not in acute distress HEENT: No icterus, No thrush, No neck mass, [place]/AT Cardiovascular: RRR, S1/S2, no rubs, no gallops Respiratory: CTA bilaterally, no wheezing, no crackles, no rhonchi Abdomen: Soft/+BS, non tender, non distended, no guarding Extremities: see pics below       Data Reviewed: I have personally reviewed following labs and imaging studies Basic Metabolic Panel: Recent Labs  Lab 07/28/22 1901 07/29/22 0411  NA 138 137  K 4.5 3.7  CL 102 102  CO2 25 27  GLUCOSE 307*  192*  BUN 15 16  CREATININE 0.85 0.61  CALCIUM 9.7 9.0  MG  --  2.2  PHOS  --  2.8   Liver Function Tests: Recent Labs  Lab 07/28/22 1901 07/29/22 0411  AST 19 16  ALT 20 18  ALKPHOS 119 98  BILITOT 0.5 0.7  PROT 7.8 7.0  ALBUMIN 4.5 3.9   No results for input(s): "LIPASE", "AMYLASE" in the last 168 hours. No results for input(s): "AMMONIA" in the last 168 hours. Coagulation Profile: No results for input(s): "INR", "PROTIME" in the last  168 hours. CBC: Recent Labs  Lab 07/28/22 1901 07/29/22 0411  WBC 8.9 6.6  NEUTROABS 5.6  --   HGB 15.6 14.9  HCT 44.7 42.9  MCV 87.0 84.8  PLT 211 190   Cardiac Enzymes: No results for input(s): "CKTOTAL", "CKMB", "CKMBINDEX", "TROPONINI" in the last 168 hours. BNP: Invalid input(s): "POCBNP" CBG: Recent Labs  Lab 07/29/22 0057  GLUCAP 239*   HbA1C: No results for input(s): "HGBA1C" in the last 72 hours. Urine analysis:    Component Value Date/Time   COLORURINE YELLOW 03/14/2017 2022   APPEARANCEUR CLEAR 03/14/2017 2022   LABSPEC 1.027 03/14/2017 2022   PHURINE 6.0 03/14/2017 2022   GLUCOSEU 50 (A) 03/14/2017 2022   HGBUR NEGATIVE 03/14/2017 2022   BILIRUBINUR NEGATIVE 03/14/2017 2022   KETONESUR 20 (A) 03/14/2017 2022   PROTEINUR 30 (A) 03/14/2017 2022   UROBILINOGEN 1.0 10/28/2009 0030   NITRITE NEGATIVE 03/14/2017 2022   LEUKOCYTESUR NEGATIVE 03/14/2017 2022   Sepsis Labs: _0 (procalcitonin:4,lacticidven:4) )No results found for this or any previous visit (from the past 240 hour(s)).   Scheduled Meds:  amLODipine  10 mg Oral Daily   atorvastatin  20 mg Oral Daily   dapagliflozin propanediol  10 mg Oral Daily   insulin aspart  0-15 Units Subcutaneous TID WC   insulin aspart  0-5 Units Subcutaneous QHS   insulin glargine-yfgn  10 Units Subcutaneous QHS   Continuous Infusions:  ceFEPime (MAXIPIME) IV 2 g (07/29/22 0546)   vancomycin 1,500 mg (07/29/22 0722)    Procedures/Studies: DG Foot Complete Left  Result Date: 07/28/2022 CLINICAL DATA:  Osteomyelitis, wound at first and third toes for several weeks, has been on antibiotics EXAM: LEFT FOOT - COMPLETE 3+ VIEW COMPARISON:  02/27/2014 FINDINGS: Osseous mineralization normal. Joint spaces preserved. Bone destruction identified at tuft of distal phalanx third toe consistent with osteomyelitis. Associated soft tissue swelling. Additional soft tissue swelling great toe. No additional bone destruction  seen however. No fracture or dislocation. Thickening of the distal Achilles tendon with calcifications consistent with prior Achilles tendon injury and or surgery. IMPRESSION: Osteomyelitis at distal phalanx of LEFT third toe with associated soft tissue swelling. No additional osseous abnormalities. These results will be called to the ordering clinician or representative by the Radiologist Assistant, and communication documented in the PACS or [employer]. Electronically Signed   By: Lavonia Dana M.D.   On: 07/28/2022 13:58    Orson Eva, DO  [facility]  If 7PM-7AM, please contact night-coverage [email or web address] Password [email or web address] 07/29/2022, 8:28 AM   LOS: 1 day

## 2022-07-30 ENCOUNTER — Encounter (HOSPITAL_COMMUNITY)
Admission: EM | Disposition: A | Discharge status: Home / Self Care | Payer: Self-pay | Source: Home / Self Care | Attending: Internal Medicine

## 2022-07-30 ENCOUNTER — Inpatient Hospital Stay (HOSPITAL_COMMUNITY): Payer: Medicare Other | Admitting: Anesthesiology

## 2022-07-30 ENCOUNTER — Other Ambulatory Visit: Payer: Self-pay

## 2022-07-30 DIAGNOSIS — Z7984 Long term (current) use of oral hypoglycemic drugs: Secondary | ICD-10-CM

## 2022-07-30 DIAGNOSIS — M869 Osteomyelitis, unspecified: Secondary | ICD-10-CM | POA: Diagnosis not present

## 2022-07-30 DIAGNOSIS — M86172 Other acute osteomyelitis, left ankle and foot: Secondary | ICD-10-CM | POA: Diagnosis not present

## 2022-07-30 DIAGNOSIS — E118 Type 2 diabetes mellitus with unspecified complications: Secondary | ICD-10-CM | POA: Diagnosis not present

## 2022-07-30 DIAGNOSIS — E1169 Type 2 diabetes mellitus with other specified complication: Secondary | ICD-10-CM

## 2022-07-30 DIAGNOSIS — F418 Other specified anxiety disorders: Secondary | ICD-10-CM

## 2022-07-30 HISTORY — PX: AMPUTATION TOE: SHX6595

## 2022-07-30 LAB — CREATININE, SERUM
Creatinine, Ser: 0.69 mg/dL (ref 0.61–1.24)
GFR, Estimated: >60 mL/min (ref >60)

## 2022-07-30 LAB — GLUCOSE, CAPILLARY
Glucose-Capillary: 210 mg/dL — ABNORMAL HIGH (ref 70–99)
Glucose-Capillary: 151 mg/dL — ABNORMAL HIGH (ref 70–99)
Glucose-Capillary: 188 mg/dL — ABNORMAL HIGH (ref 70–99)
Glucose-Capillary: 154 mg/dL — ABNORMAL HIGH (ref 70–99)
Glucose-Capillary: 104 mg/dL — ABNORMAL HIGH (ref 70–99)
Glucose-Capillary: 87 mg/dL (ref 70–99)
Glucose-Capillary: 147 mg/dL — ABNORMAL HIGH (ref 70–99)
Glucose-Capillary: 89 mg/dL (ref 70–99)
Glucose-Capillary: 226 mg/dL — ABNORMAL HIGH (ref 70–99)
Glucose-Capillary: 245 mg/dL — ABNORMAL HIGH (ref 70–99)

## 2022-07-30 SURGERY — AMPUTATION, TOE
Anesthesia: General | Site: Toe | Laterality: Left

## 2022-07-30 MED ORDER — DEXTROSE 50 % IV SOLN
50.0000 mL | Freq: Once | INTRAVENOUS | Status: AC
  Administered 2022-07-30: 50 mL via INTRAVENOUS

## 2022-07-30 MED ORDER — LIDOCAINE HCL (PF) 1 % IJ SOLN
INTRAMUSCULAR | Status: AC
  Filled 2022-07-30: qty 30

## 2022-07-30 MED ORDER — MORPHINE SULFATE (PF) 2 MG/ML IV SOLN: 2.0000 mg | INTRAVENOUS | Status: DC | PRN

## 2022-07-30 MED ORDER — DEXTROSE 50 % IV SOLN
INTRAVENOUS | Status: AC
  Filled 2022-07-30: qty 50

## 2022-07-30 MED ORDER — FENTANYL CITRATE (PF) 100 MCG/2ML IJ SOLN
INTRAMUSCULAR | Status: AC
  Filled 2022-07-30: qty 2

## 2022-07-30 MED ORDER — LIDOCAINE HCL (CARDIAC) PF 100 MG/5ML IV SOSY
PREFILLED_SYRINGE | INTRAVENOUS | Status: DC | PRN
  Administered 2022-07-30: 40 mg via INTRAVENOUS

## 2022-07-30 MED ORDER — OXYCODONE HCL 5 MG PO TABS
5.0000 mg | ORAL_TABLET | ORAL | Status: DC | PRN
  Administered 2022-07-31: 5 mg via ORAL
  Filled 2022-07-30: qty 1

## 2022-07-30 MED ORDER — PROPOFOL 10 MG/ML IV BOLUS
INTRAVENOUS | Status: DC | PRN
  Administered 2022-07-30 (×2): 20 mg via INTRAVENOUS

## 2022-07-30 MED ORDER — SODIUM CHLORIDE 0.9 % IR SOLN
Status: DC | PRN
  Administered 2022-07-30: 1000 mL

## 2022-07-30 MED ORDER — LIDOCAINE HCL 1 % IJ SOLN
INTRAMUSCULAR | Status: DC | PRN
  Administered 2022-07-30: 15 mL

## 2022-07-30 MED ORDER — ONDANSETRON HCL 4 MG/2ML IJ SOLN
INTRAMUSCULAR | Status: DC | PRN
  Administered 2022-07-30: 4 mg via INTRAVENOUS

## 2022-07-30 MED ORDER — MIDAZOLAM HCL 2 MG/2ML IJ SOLN
INTRAMUSCULAR | Status: DC | PRN
  Administered 2022-07-30: 2 mg via INTRAVENOUS

## 2022-07-30 MED ORDER — DEXAMETHASONE SODIUM PHOSPHATE 10 MG/ML IJ SOLN
INTRAMUSCULAR | Status: AC
  Filled 2022-07-30: qty 1

## 2022-07-30 MED ORDER — PROPOFOL 500 MG/50ML IV EMUL
INTRAVENOUS | Status: DC | PRN
  Administered 2022-07-30: 150 ug/kg/min via INTRAVENOUS

## 2022-07-30 MED ORDER — BUPIVACAINE HCL (PF) 0.5 % IJ SOLN
INTRAMUSCULAR | Status: AC
  Filled 2022-07-30: qty 30

## 2022-07-30 MED ORDER — PROPOFOL 10 MG/ML IV BOLUS
INTRAVENOUS | Status: AC
  Filled 2022-07-30: qty 20

## 2022-07-30 MED ORDER — LIDOCAINE HCL (PF) 2 % IJ SOLN
INTRAMUSCULAR | Status: AC
  Filled 2022-07-30: qty 10

## 2022-07-30 MED ORDER — METOCLOPRAMIDE HCL 5 MG/ML IJ SOLN
INTRAMUSCULAR | Status: AC
  Filled 2022-07-30: qty 2

## 2022-07-30 MED ORDER — FENTANYL CITRATE (PF) 100 MCG/2ML IJ SOLN
INTRAMUSCULAR | Status: DC | PRN
  Administered 2022-07-30: 100 ug via INTRAVENOUS

## 2022-07-30 MED ORDER — MIDAZOLAM HCL 2 MG/2ML IJ SOLN
INTRAMUSCULAR | Status: AC
  Filled 2022-07-30: qty 2

## 2022-07-30 MED ORDER — LACTATED RINGERS IV SOLN: INTRAVENOUS | Status: DC

## 2022-07-30 MED ORDER — ONDANSETRON HCL 4 MG/2ML IJ SOLN
INTRAMUSCULAR | Status: AC
  Filled 2022-07-30: qty 2

## 2022-07-30 MED ORDER — MEPERIDINE HCL 50 MG/ML IJ SOLN: 6.2500 mg | INTRAMUSCULAR | Status: DC | PRN

## 2022-07-30 MED ORDER — METOCLOPRAMIDE HCL 5 MG/ML IJ SOLN
10.0000 mg | Freq: Once | INTRAMUSCULAR | Status: AC
  Administered 2022-07-30: 10 mg via INTRAVENOUS

## 2022-07-30 MED ORDER — ORAL CARE MOUTH RINSE: 15.0000 mL | Freq: Once | OROMUCOSAL | Status: AC

## 2022-07-30 MED ORDER — PROPOFOL 500 MG/50ML IV EMUL
INTRAVENOUS | Status: AC
  Filled 2022-07-30: qty 50

## 2022-07-30 MED ORDER — CHLORHEXIDINE GLUCONATE 0.12 % MT SOLN
15.0000 mL | Freq: Once | OROMUCOSAL | Status: AC
  Administered 2022-07-30: 15 mL via OROMUCOSAL

## 2022-07-30 MED ORDER — HYDROMORPHONE HCL 1 MG/ML IJ SOLN: 0.2500 mg | INTRAMUSCULAR | Status: DC | PRN

## 2022-07-30 SURGICAL SUPPLY — 30 items
BLADE AVERAGE 25X9 (BLADE) ×1 IMPLANT
BLADE SURG 15 STRL LF DISP TIS (BLADE) ×1 IMPLANT
BLADE SURG 15 STRL SS (BLADE) ×1
BNDG ELASTIC 4X5.8 VLCR STR LF (GAUZE/BANDAGES/DRESSINGS) IMPLANT
BNDG GAUZE DERMACEA FLUFF 4 (GAUZE/BANDAGES/DRESSINGS) IMPLANT
BNDG GZE DERMACEA 4 6PLY (GAUZE/BANDAGES/DRESSINGS) ×1
CLOTH BEACON ORANGE TIMEOUT ST (SAFETY) ×1 IMPLANT
COVER LIGHT HANDLE STERIS (MISCELLANEOUS) ×2 IMPLANT
ELECT REM PT RETURN 9FT ADLT (ELECTROSURGICAL) ×1
ELECTRODE REM PT RTRN 9FT ADLT (ELECTROSURGICAL) ×1 IMPLANT
GAUZE SPONGE 4X4 12PLY STRL (GAUZE/BANDAGES/DRESSINGS) ×2 IMPLANT
GAUZE XEROFORM 1X8 LF (GAUZE/BANDAGES/DRESSINGS) IMPLANT
GLOVE BIO SURGEON STRL SZ 6.5 (GLOVE) ×1 IMPLANT
GLOVE BIOGEL PI IND STRL 6.5 (GLOVE) ×1 IMPLANT
GLOVE BIOGEL PI IND STRL 7.0 (GLOVE) ×2 IMPLANT
GOWN STRL REUS W/TWL LRG LVL3 (GOWN DISPOSABLE) ×2 IMPLANT
KIT TURNOVER KIT A (KITS) ×1 IMPLANT
MANIFOLD NEPTUNE II (INSTRUMENTS) ×1 IMPLANT
NDL HYPO 25X1 1.5 SAFETY (NEEDLE) ×1 IMPLANT
NEEDLE HYPO 25X1 1.5 SAFETY (NEEDLE) ×1 IMPLANT
NS IRRIG 1000ML POUR BTL (IV SOLUTION) ×1 IMPLANT
PACK BASIC LIMB (CUSTOM PROCEDURE TRAY) ×1 IMPLANT
PAD ARMBOARD 7.5X6 YLW CONV (MISCELLANEOUS) ×1 IMPLANT
SET BASIN LINEN APH (SET/KITS/TRAYS/PACK) ×1 IMPLANT
SOL PREP PROV IODINE SCRUB 4OZ (MISCELLANEOUS) IMPLANT
SPONGE T-LAP 18X18 ~~LOC~~+RFID (SPONGE) ×1 IMPLANT
SUT ETHILON 3 0 FSL (SUTURE) ×1 IMPLANT
SUT VIC AB 3-0 SH 27 (SUTURE) ×1
SUT VIC AB 3-0 SH 27X BRD (SUTURE) IMPLANT
SYR CONTROL 10ML LL (SYRINGE) ×1 IMPLANT

## 2022-07-30 NOTE — Anesthesia Postprocedure Evaluation (Signed)
Anesthesia Post Note  Patient: Albert Mcdonald  Procedure(s) Performed: AMPUTATION TOE, left third toe (Left: Toe)  Patient location during evaluation: PACU Anesthesia Type: General Level of consciousness: awake and alert and oriented Pain management: pain level controlled Vital Signs Assessment: post-procedure vital signs reviewed and stable Respiratory status: spontaneous breathing, nonlabored ventilation, respiratory function stable and patient connected to nasal cannula oxygen Cardiovascular status: blood pressure returned to baseline and stable Postop Assessment: no apparent nausea or vomiting Anesthetic complications: no   No notable events documented.   Last Vitals:  Vitals:   07/30/22 1745 07/30/22 1751  BP: 115/79   Pulse: 69 68  Resp: 11 15  Temp:    SpO2: 99% 97%    Last Pain:  Vitals:   07/30/22 1741  TempSrc:   PainSc: 0-No pain                 Deatrice Spanbauer C Beaulah Romanek

## 2022-07-30 NOTE — Progress Notes (Signed)
[facility]  Toe amputation completed. Updated Honey his sister. Heel weight bearing. Skin closed. Operative shoe. Hopefully home tomorrow if doing ok.   Curlene Labrum, MD [facility] [address] [place],  [address] [phone] (office)

## 2022-07-30 NOTE — Progress Notes (Signed)
PROGRESS NOTE  Albert Mcdonald:[id number] DOB: Jan 13, 1971 DOA: 07/28/2022 PCP: Leslie Andrea, MD  Brief History:  51 year old male with a history of hypertension, ADD, diabetes mellitus type 2, hyperlipidemia presenting with left third toe infection and abnormal x-ray of the left foot.  The patient is a difficult historian at best.  Review the medical record shows that he visited the emergency department on 07/11/2022 for his left third toe.  He was sent home with doxycycline.  He stated that he took 10 days of the antibiotics.  He followed up with his PCP who ordered an x-ray of his left foot.  The x-ray showed osteomyelitis of the distal phalanx of the left third toe with soft tissue edema.  He was directed to the emergency department for further evaluation and treatment.  The patient states that he does not particularly have pain in his left foot but he did notice some swelling of the left third toe about 2 to 3 weeks ago.  He denies any fevers, chills, nausea, vomiting, diarrhea, chest pain, shortness breath. In the ED, the patient was afebrile and hemodynamically stable with oxygen saturation 99% room air.  WBC 8.9, hemoglobin 15.6, platelets 211,000.  LFTs were unremarkable.  The patient was started on vancomycin and cefepime.     Assessment and Plan: * Acute osteomyelitis of left foot ([place]) 07/28/2022--plain x-ray shows osteomyelitis distal phalanx left third toe Check ESR 9 Check CRP--0.8 Continue empiric vancomycin Switched cefepime to ceftriaxone Patient has a good posterior tibial pulse and dorsalis pedis pulse General surgery consult>>left third toe amputation 11/3  Essential hypertension Continue amlodipine  Mixed hyperlipidemia Continue statin  Type 2 diabetes mellitus with complication ([place]) 01/31/3892 hemoglobin A1c 11.8 Start Semglee 10 units daily NovoLog sliding scale Holding metformin Restart Farxiga   Family Communication:  no Family at  bedside   Consultants:  gen surgery   Code Status:  FULL   DVT Prophylaxis:  [place] Heparin     Procedures: As Listed in Progress Note Above   Antibiotics: Vanc 11/1>> Ceftriaxone 11/2>.            Subjective: Patient denies fevers, chills, headache, chest pain, dyspnea, nausea, vomiting, diarrhea, abdominal pain, dysuria,    Objective: Vitals:   07/30/22 1741 07/30/22 1745 07/30/22 1751 07/30/22 1800  BP: 115/79 115/79  115/79  Pulse: 68 69 68 74  Resp: (!) _0 Temp: (!) 97.4 F (36.3 C)     TempSrc:      SpO2: 100% 99% 97% 100%  Weight:      Height:        Intake/Output Summary (Last 24 hours) at 07/30/2022 1814 Last data filed at 07/30/2022 1745 Gross per 24 hour  Intake 1988.32 ml  Output 5 ml  Net 1983.32 ml   Weight change:  Exam:  General:  Pt is alert, follows commands appropriately, not in acute distress HEENT: No icterus, No thrush, No neck mass, Nectar/AT Cardiovascular: RRR, S1/S2, no rubs, no gallops Respiratory: CTA bilaterally, no wheezing, no crackles, no rhonchi Abdomen: Soft/+BS, non tender, non distended, no guarding Extremities: No edema, No lymphangitis, No petechiae, No rashes, no synovitis;   left third toe edema   Data Reviewed: I have personally reviewed following labs and imaging studies Basic Metabolic Panel: Recent Labs  Lab 07/28/22 1901 07/29/22 0411 07/30/22 0420  NA 138 137  --   K 4.5 3.7  --   CL 102  102  --   CO2 25 27  --   GLUCOSE 307* 192*  --   BUN 15 16  --   CREATININE 0.85 0.61 0.69  CALCIUM 9.7 9.0  --   MG  --  2.2  --   PHOS  --  2.8  --    Liver Function Tests: Recent Labs  Lab 07/28/22 1901 07/29/22 0411  AST 19 16  ALT 20 18  ALKPHOS 119 98  BILITOT 0.5 0.7  PROT 7.8 7.0  ALBUMIN 4.5 3.9   No results for input(s): "LIPASE", "AMYLASE" in the last 168 hours. No results for input(s): "AMMONIA" in the last 168 hours. Coagulation Profile: No results for input(s): "INR", "PROTIME" in  the last 168 hours. CBC: Recent Labs  Lab 07/28/22 1901 07/29/22 0411  WBC 8.9 6.6  NEUTROABS 5.6  --   HGB 15.6 14.9  HCT 44.7 42.9  MCV 87.0 84.8  PLT 211 190   Cardiac Enzymes: No results for input(s): "CKTOTAL", "CKMB", "CKMBINDEX", "TROPONINI" in the last 168 hours. BNP: Invalid input(s): "POCBNP" CBG: Recent Labs  Lab 07/30/22 0753 07/30/22 1202 07/30/22 1502 07/30/22 1557 07/30/22 1751  GLUCAP 154* 104* 87 147* 89   HbA1C: No results for input(s): "HGBA1C" in the last 72 hours. Urine analysis:    Component Value Date/Time   COLORURINE YELLOW 03/14/2017 2022   APPEARANCEUR CLEAR 03/14/2017 2022   LABSPEC 1.027 03/14/2017 2022   PHURINE 6.0 03/14/2017 2022   GLUCOSEU 50 (A) 03/14/2017 2022   HGBUR NEGATIVE 03/14/2017 2022   BILIRUBINUR NEGATIVE 03/14/2017 2022   KETONESUR 20 (A) 03/14/2017 2022   PROTEINUR 30 (A) 03/14/2017 2022   UROBILINOGEN 1.0 10/28/2009 0030   NITRITE NEGATIVE 03/14/2017 2022   LEUKOCYTESUR NEGATIVE 03/14/2017 2022   Sepsis Labs: _0 (procalcitonin:4,lacticidven:4) ) Recent Results (from the past 240 hour(s))  Surgical pcr screen     Status: None   Collection Time: 07/29/22 10:12 PM   Specimen: Nasal Mucosa; Nasal Swab  Result Value Ref Range Status   MRSA, PCR NEGATIVE NEGATIVE Final   Staphylococcus aureus NEGATIVE NEGATIVE Final    Comment: (NOTE) The Xpert SA Assay (FDA approved for NASAL specimens in patients 73 years of age and older), is one component of a comprehensive surveillance program. It is not intended to diagnose infection nor to guide or monitor treatment. Performed at [facility], [address], [place], [place] [address]      Scheduled Meds:  [MAR Hold] amLODipine  10 mg Oral Daily   [MAR Hold] atorvastatin  20 mg Oral Daily   [MAR Hold] dapagliflozin propanediol  10 mg Oral Daily   [MAR Hold] insulin aspart  0-15 Units Subcutaneous TID WC   [MAR Hold] insulin aspart  0-5 Units Subcutaneous QHS    [MAR Hold] insulin glargine-yfgn  10 Units Subcutaneous QHS   Continuous Infusions:  [MAR Hold] cefTRIAXone (ROCEPHIN)  IV 2 g (07/30/22 1031)   [MAR Hold] vancomycin 150 mL/hr at 07/30/22 0532    Procedures/Studies: US ARTERIAL ABI (SCREENING LOWER EXTREMITY)  Result Date: 07/29/2022 CLINICAL DATA:  Left toe osteomyelitis EXAM: NONINVASIVE PHYSIOLOGIC VASCULAR STUDY OF BILATERAL LOWER EXTREMITIES TECHNIQUE: Evaluation of both lower extremities were performed at rest, including calculation of ankle-brachial indices with single level Doppler, pressure and pulse volume recording. COMPARISON:  10/22/2020 FINDINGS: Right ABI:  1.47 (previously 1.31) Left ABI:  1.39 (previously 1.31) Right Lower Extremity:  Multiphasic arterial waveforms at the ankle. Left Lower Extremity:  Multiphasic arterial waveforms at the ankle.  IMPRESSION: No evidence of hemodynamically significant lower extremity arterial occlusive disease at rest. Electronically Signed   By: Lucrezia Europe M.D.   On: 07/29/2022 12:48   MR FOOT LEFT WO CONTRAST  Result Date: 07/29/2022 CLINICAL DATA:  Foot swelling, diabetic, osteomyelitis suspected, xray done EXAM: MRI OF THE LEFT FOOT WITHOUT CONTRAST TECHNIQUE: Multiplanar, multisequence MR imaging of the left forefoot was performed. No intravenous contrast was administered. COMPARISON:  X-ray 07/28/2022 FINDINGS: Bones/Joint/Cartilage Acute osteomyelitis of the distal phalanx of the left third toe with bone destruction of the distal tuft. Marked bone marrow edema and confluent low T1 signal changes within the residual distal phalanx. Subtle bone marrow edema within the middle phalanx of the third toe with preservation of the fatty T1 marrow signal. Remaining bony structures of the forefoot demonstrate normal signal. No fracture or dislocation. Ligaments Intact Lisfranc ligament.  Intact collateral ligaments. Muscles and Tendons Increased T2 signal within the intrinsic foot musculature which may  reflect denervation or myositis. Intact flexor and extensor tendons. No tenosynovitis. Soft tissues Prominent soft tissue swelling and edema of the third toe. No deep soft tissue ulceration. No organized fluid collection. IMPRESSION: 1. Acute osteomyelitis of the distal phalanx of the left third toe. 2. Subtle bone marrow edema within the middle phalanx of the third toe may reflect reactive osteitis versus early acute osteomyelitis. 3. Prominent soft tissue swelling and edema of the third toe. No organized fluid collection. Electronically Signed   By: Davina Poke D.O.   On: 07/29/2022 11:40   DG Foot Complete Left  Result Date: 07/28/2022 CLINICAL DATA:  Osteomyelitis, wound at first and third toes for several weeks, has been on antibiotics EXAM: LEFT FOOT - COMPLETE 3+ VIEW COMPARISON:  02/27/2014 FINDINGS: Osseous mineralization normal. Joint spaces preserved. Bone destruction identified at tuft of distal phalanx third toe consistent with osteomyelitis. Associated soft tissue swelling. Additional soft tissue swelling great toe. No additional bone destruction seen however. No fracture or dislocation. Thickening of the distal Achilles tendon with calcifications consistent with prior Achilles tendon injury and or surgery. IMPRESSION: Osteomyelitis at distal phalanx of LEFT third toe with associated soft tissue swelling. No additional osseous abnormalities. These results will be called to the ordering clinician or representative by the Radiologist Assistant, and communication documented in the PACS or [employer]. Electronically Signed   By: Lavonia Dana M.D.   On: 07/28/2022 13:58    Orson Eva, DO  [facility]  If 7PM-7AM, please contact night-coverage [email or web address] Password [email or web address] 07/30/2022, 6:14 PM   LOS: 2 days

## 2022-07-30 NOTE — Interval H&P Note (Signed)
History and Physical Interval Note:  07/30/2022 2:52 PM  Albert Mcdonald  has presented today for surgery, with the diagnosis of osteomyelitis.  The various methods of treatment have been discussed with the patient and family. After consideration of risks, benefits and other options for treatment, the patient has consented to  Procedure(s): AMPUTATION TOE, left third toe (Left) as a surgical intervention.  The patient's history has been reviewed, patient examined, no change in status, stable for surgery.  I have reviewed the patient's chart and labs.  Questions were answered to the patient's satisfaction.    Marked  Virl Cagey

## 2022-07-30 NOTE — Anesthesia Preprocedure Evaluation (Addendum)
Anesthesia Evaluation  Patient identified by MRN, date of birth, ID band Patient awake    Reviewed: Allergy & Precautions, H&P , NPO status , Patient's Chart, lab work & pertinent test results  History of Anesthesia Complications (+) Family history of anesthesia reaction and history of anesthetic complications  Airway Mallampati: II  TM Distance: >3 FB Neck ROM: Full    Dental  (+) Dental Advisory Given, Edentulous Upper, Edentulous Lower   Pulmonary neg pulmonary ROS   Pulmonary exam normal breath sounds clear to auscultation       Cardiovascular hypertension, Pt. on medications Normal cardiovascular exam Rhythm:Regular Rate:Normal     Neuro/Psych  PSYCHIATRIC DISORDERS Anxiety Depression     Neuromuscular disease (amyotrophic lateral sclerosis)    GI/Hepatic negative GI ROS,,,(+)     substance abuse  cocaine use  Endo/Other  diabetes, Well Controlled, Type 2, Oral Hypoglycemic Agents    Renal/GU negative Renal ROS  negative genitourinary   Musculoskeletal  (+) Arthritis , Osteoarthritis,  OSTEOMYELITIS    Abdominal   Peds negative pediatric ROS (+)  Hematology negative hematology ROS (+)   Anesthesia Other Findings [name]  Reproductive/Obstetrics negative OB ROS                             Anesthesia Physical Anesthesia Plan  ASA: 3  Anesthesia Plan: General   Post-op Pain Management: Dilaudid IV   Induction: Intravenous  PONV Risk Score and Plan: Ondansetron, Metaclopromide, Propofol infusion and TIVA  Airway Management Planned: Nasal Cannula, Natural Airway and Simple Face Mask  Additional Equipment:   Intra-op Plan:   Post-operative Plan: Extubation in OR  Informed Consent: I have reviewed the patients History and Physical, chart, labs and discussed the procedure including the risks, benefits and alternatives for the proposed anesthesia with the  patient or authorized representative who has indicated his/her understanding and acceptance.     Dental advisory given  Plan Discussed with:   Anesthesia Plan Comments: (TIVA with local block by Dr. Constance Haw. Possible GA with airway was discussed.)        Anesthesia Quick Evaluation

## 2022-07-30 NOTE — Progress Notes (Signed)
Subjective: No acute events overnight. He reports mild pain of the left third toe overnight. Patient denies fevers, chills, nausea, or vomiting. He has been NPO since yesterday at 9pm. He is ready for his surgery this afternoon.  Objective: Vital signs in last 24 hours: Temp:  [98.5 F (36.9 C)-98.8 F (37.1 C)] 98.5 F (36.9 C) (11/03 0504) Pulse Rate:  [65-77] 65 (11/03 0504) Resp:  [13-18] 13 (11/03 0504) BP: (132-137)/(80-94) 134/94 (11/03 0504) SpO2:  [98 %-100 %] 99 % (11/03 0504) Last BM Date : 07/28/22  Intake/Output from previous day: 11/02 0701 - 11/03 0700 In: 1228.3 [P.O.:480; IV Piggyback:748.3] Out: -  Intake/Output this shift: No intake/output data recorded.  General appearance: alert and no distress Pulm: Normal work of breathing. CV: Normal rate. Extr: Left great toe with dry gangrene. Left third toe with mild swelling, callous, and small ulcer.   Lab Results:  Recent Labs    07/28/22 1901 07/29/22 0411  WBC 8.9 6.6  HGB 15.6 14.9  HCT 44.7 42.9  PLT 211 190   BMET Recent Labs    07/28/22 1901 07/29/22 0411 07/30/22 0420  NA 138 137  --   K 4.5 3.7  --   CL 102 102  --   CO2 25 27  --   GLUCOSE 307* 192*  --   BUN 15 16  --   CREATININE 0.85 0.61 0.69  CALCIUM 9.7 9.0  --    PT/INR No results for input(s): "LABPROT", "INR" in the last 72 hours.  Studies/Results: US ARTERIAL ABI (SCREENING LOWER EXTREMITY)  Result Date: 07/29/2022 CLINICAL DATA:  Left toe osteomyelitis EXAM: NONINVASIVE PHYSIOLOGIC VASCULAR STUDY OF BILATERAL LOWER EXTREMITIES TECHNIQUE: Evaluation of both lower extremities were performed at rest, including calculation of ankle-brachial indices with single level Doppler, pressure and pulse volume recording. COMPARISON:  10/22/2020 FINDINGS: Right ABI:  1.47 (previously 1.31) Left ABI:  1.39 (previously 1.31) Right Lower Extremity:  Multiphasic arterial waveforms at the ankle. Left Lower Extremity:  Multiphasic arterial  waveforms at the ankle. IMPRESSION: No evidence of hemodynamically significant lower extremity arterial occlusive disease at rest. Electronically Signed   By: Lucrezia Europe M.D.   On: 07/29/2022 12:48   MR FOOT LEFT WO CONTRAST  Result Date: 07/29/2022 CLINICAL DATA:  Foot swelling, diabetic, osteomyelitis suspected, xray done EXAM: MRI OF THE LEFT FOOT WITHOUT CONTRAST TECHNIQUE: Multiplanar, multisequence MR imaging of the left forefoot was performed. No intravenous contrast was administered. COMPARISON:  X-ray 07/28/2022 FINDINGS: Bones/Joint/Cartilage Acute osteomyelitis of the distal phalanx of the left third toe with bone destruction of the distal tuft. Marked bone marrow edema and confluent low T1 signal changes within the residual distal phalanx. Subtle bone marrow edema within the middle phalanx of the third toe with preservation of the fatty T1 marrow signal. Remaining bony structures of the forefoot demonstrate normal signal. No fracture or dislocation. Ligaments Intact Lisfranc ligament.  Intact collateral ligaments. Muscles and Tendons Increased T2 signal within the intrinsic foot musculature which may reflect denervation or myositis. Intact flexor and extensor tendons. No tenosynovitis. Soft tissues Prominent soft tissue swelling and edema of the third toe. No deep soft tissue ulceration. No organized fluid collection. IMPRESSION: 1. Acute osteomyelitis of the distal phalanx of the left third toe. 2. Subtle bone marrow edema within the middle phalanx of the third toe may reflect reactive osteitis versus early acute osteomyelitis. 3. Prominent soft tissue swelling and edema of the third toe. No organized fluid collection. Electronically Signed  By: Davina Poke D.O.   On: 07/29/2022 11:40    Anti-infectives: Anti-infectives (From admission, onward)    Start     Dose/Rate Route Frequency Ordered Stop   07/29/22 1000  cefTRIAXone (ROCEPHIN) 2 g in sodium chloride 0.9 % 100 mL IVPB        2  g 200 mL/hr over 30 Minutes Intravenous Every 24 hours 07/29/22 0830     07/29/22 0600  vancomycin (VANCOREADY) IVPB 1500 mg/300 mL        1,500 mg 150 mL/hr over 120 Minutes Intravenous Every 12 hours 07/29/22 0031     07/29/22 0600  ceFEPIme (MAXIPIME) 2 g in sodium chloride 0.9 % 100 mL IVPB  Status:  Discontinued        2 g 200 mL/hr over 30 Minutes Intravenous Every 8 hours 07/29/22 0031 07/29/22 0830   07/28/22 2045  vancomycin (VANCOCIN) IVPB 1000 mg/200 mL premix        1,000 mg 200 mL/hr over 60 Minutes Intravenous  Once 07/28/22 2035 07/28/22 2228   07/28/22 2045  ceFEPIme (MAXIPIME) 2 g in sodium chloride 0.9 % 100 mL IVPB        2 g 200 mL/hr over 30 Minutes Intravenous  Once 07/28/22 2035 07/28/22 2123       Assessment/Plan: s/p Procedure(s): AMPUTATION TOE, left third toe -Continue Ceftriaxone and Vancomycin -All questions were answered about surgery this afternoon  PPX -SCDs   LOS: 2 days    Leonette Nutting 07/30/2022

## 2022-07-30 NOTE — Care Management Important Message (Signed)
Important Message  Patient Details  Name: Albert Mcdonald MRN: [id number] Date of Birth: 10/21/1970   Medicare Important Message Given:  Yes     Tommy Medal 07/30/2022, 11:37 AM

## 2022-07-30 NOTE — Transfer of Care (Addendum)
Immediate Anesthesia Transfer of Care Note  Patient: Albert Mcdonald  Procedure(s) Performed: AMPUTATION TOE, left third toe (Left: Toe)  Patient Location: PACU  Anesthesia Type:General  Level of Consciousness: awake, alert , drowsy, and patient cooperative  Airway & Oxygen Therapy: Patient Spontanous Breathing and Patient connected to nasal cannula oxygen  Post-op Assessment: Report given to RN and Post -op Vital signs reviewed and stable  Post vital signs: Reviewed and stable  Last Vitals:  Vitals Value Taken Time  BP 115/79 07/30/22 1745  Temp 97.5 07/30/22 1744  Pulse 70 07/30/22 1752  Resp 23 07/30/22 1752  SpO2 95 % 07/30/22 1752  Vitals shown include unvalidated device data.  Last Pain:  Vitals:   07/30/22 1455  TempSrc: Oral  PainSc:       Patients Stated Pain Goal: 0 (37/79/39 6886)  Complications: No notable events documented.

## 2022-07-30 NOTE — Op Note (Signed)
[facility] Operative Note  07/30/22  Preoperative Diagnosis: Left third toe osteomyelitis    Postoperative Diagnosis: Same   Procedure(s) Performed: Left third toe amputation (between proximal phalanx and metatarsal)    Surgeon: Lanell Matar. Constance Haw, MD   Assistants: No qualified resident was available    Anesthesia: General endotracheal   Anesthesiologist: Denese Killings, MD    Specimens: Left third toe    Estimated Blood Loss: Minimal   Blood Replacement: None    Complications: None   Wound Class: Clean    Operative Indications: Albert Mcdonald is a 51 yo who has osteomyelitis of the third distal and middle phalanx. We discussed the risk of bleeding, infection, wound opening or not healing.    Findings: Normal appearing proximal phalanx   Procedure: The patient was taken to the operating room and placed supine. Monitored anesthesia was induced. Intravenous antibiotics were administered per protocol. The left foot was prepared and draped in the usual sterile fashion.   A digital toe block was done with lidocaine 1%.  An incision was made around the skin of the proximal phalanx. Using cautery and sharp dissection, the third toe was removed between the proximal phalanx and metatarsal.  The cavity was made hemostatic. A 3-0 Vicryl was used to close the tissue over the metatarsal head.  The skin was closed with 2-0 Nylon interrupted sutures. Xeroform gauze, gauze and Ace wrap were placed.   Final inspection revealed acceptable hemostasis. All counts were correct at the end of the case. The patient was awakened from anesthesia.  The patient went to the PACU in stable condition.   Albert Labrum, MD [facility] [address] [place], [place] [address] [phone] (office)

## 2022-07-31 DIAGNOSIS — E118 Type 2 diabetes mellitus with unspecified complications: Secondary | ICD-10-CM | POA: Diagnosis not present

## 2022-07-31 DIAGNOSIS — M86172 Other acute osteomyelitis, left ankle and foot: Secondary | ICD-10-CM | POA: Diagnosis not present

## 2022-07-31 DIAGNOSIS — M869 Osteomyelitis, unspecified: Secondary | ICD-10-CM | POA: Diagnosis not present

## 2022-07-31 LAB — GLUCOSE, CAPILLARY
Glucose-Capillary: 170 mg/dL — ABNORMAL HIGH (ref 70–99)
Glucose-Capillary: 127 mg/dL — ABNORMAL HIGH (ref 70–99)

## 2022-07-31 MED ORDER — DOXYCYCLINE HYCLATE 100 MG PO TABS: 100.0000 mg | ORAL_TABLET | Freq: Two times a day (BID) | ORAL | 0 refills | Status: DC

## 2022-07-31 MED ORDER — LEVOFLOXACIN 500 MG PO TABS: 500.0000 mg | ORAL_TABLET | Freq: Every day | ORAL | 0 refills | Status: DC

## 2022-07-31 MED ORDER — LANTUS SOLOSTAR 100 UNIT/ML ~~LOC~~ SOPN: 15.0000 [IU] | PEN_INJECTOR | Freq: Every day | SUBCUTANEOUS | 1 refills | Status: DC

## 2022-07-31 MED ORDER — DOXYCYCLINE HYCLATE 100 MG PO TABS
100.0000 mg | ORAL_TABLET | Freq: Two times a day (BID) | ORAL | Status: DC
  Administered 2022-07-31: 100 mg via ORAL
  Filled 2022-07-31: qty 1

## 2022-07-31 NOTE — Progress Notes (Signed)
IV removed by patient. Discharge paper work given and reviewed with patient. Patient had no further questions or concerns. Patient wheeled down to main entrance via wheelchair to private vehicle.

## 2022-07-31 NOTE — Discharge Summary (Signed)
Physician Discharge Summary   Patient: Albert Mcdonald MRN: [id number] DOB: 20-May-1971  Admit date:     07/28/2022  Discharge date: 07/31/22  Discharge Physician: Shanon Brow Colleen Kotlarz   PCP: Leslie Andrea, MD   Recommendations at discharge:   Please follow up with primary care provider within 1-2 weeks  Please repeat BMP and CBC in one week  Please follow up on/with      Hospital Course: 51 year old male with a history of hypertension, ADD, diabetes mellitus type 2, hyperlipidemia presenting with left third toe infection and abnormal x-ray of the left foot.  The patient is a difficult historian at best.  Review the medical record shows that he visited the emergency department on 07/11/2022 for his left third toe.  He was sent home with doxycycline.  He stated that he took 10 days of the antibiotics.  He followed up with his PCP who ordered an x-ray of his left foot.  The x-ray showed osteomyelitis of the distal phalanx of the left third toe with soft tissue edema.  He was directed to the emergency department for further evaluation and treatment.  The patient states that he does not particularly have pain in his left foot but he did notice some swelling of the left third toe about 2 to 3 weeks ago.  He denies any fevers, chills, nausea, vomiting, diarrhea, chest pain, shortness breath. In the ED, the patient was afebrile and hemodynamically stable with oxygen saturation 99% room air.  WBC 8.9, hemoglobin 15.6, platelets 211,000.  LFTs were unremarkable.  The patient was started on vancomycin and cefepime.  Assessment and Plan: * Acute osteomyelitis of left foot ([place]) 07/28/2022--plain x-ray shows osteomyelitis distal phalanx left third toe Check ESR 9 Check CRP--0.8 Continue empiric vancomycin during hospitalization Switched cefepime to ceftriaxone Patient has a good posterior tibial pulse and dorsalis pedis pulse General surgery consult>>left third toe amputation 11/3 -d/c home with doxy and  levoflox x 4 more days to treat soft tissue infection component (cellulitis) of foot  Essential hypertension Continue amlodipine  Mixed hyperlipidemia Continue statin  Type 2 diabetes mellitus with complication ([place]) 0/04/6577 hemoglobin A1c 11.8 Start Semglee 10 units daily>.increased to 15 NovoLog sliding scale Restart metformin at [employer] -will likely need short acting insulin during day He has appointment with endocrine on 09/01/22         Consultants: general surgery Procedures performed: none  Disposition: Home Diet recommendation:  Carb modified diet DISCHARGE MEDICATION: Allergies as of 07/31/2022       Reactions   Ace Inhibitors Rash        Medication List     STOP taking these medications    clindamycin 300 MG capsule Commonly known as: CLEOCIN   lisinopril 40 MG tablet Commonly known as: ZESTRIL       TAKE these medications    amLODipine 10 MG tablet Commonly known as: NORVASC Take 10 mg by mouth daily.   atorvastatin 20 MG tablet Commonly known as: LIPITOR Take 20 mg by mouth daily.   blood glucose meter kit and supplies Dispense based on patient and insurance preference. Use up to four times daily as directed. (FOR ICD-10 E10.9, E11.9).   citalopram 40 MG tablet Commonly known as: CELEXA Take 40 mg by mouth daily.   cyclobenzaprine 10 MG tablet Commonly known as: FLEXERIL Take 10 mg by mouth 3 (three) times daily.   doxycycline 100 MG tablet Commonly known as: VIBRA-TABS Take 1 tablet (100 mg total) by mouth every  12 (twelve) hours.   Farxiga 10 MG Tabs tablet Generic drug: dapagliflozin propanediol Take 10 mg by mouth every morning.   Insulin Pen Needle 32G X 4 MM Misc 1 each by Does not apply route 3 (three) times daily.   Lantus SoloStar 100 UNIT/ML Solostar Pen Generic drug: insulin glargine Inject 15 Units into the skin daily. What changed: how much to take   levofloxacin 500 MG tablet Commonly known  as: LEVAQUIN Take 1 tablet (500 mg total) by mouth daily. Start taking on: August 01, 2022   metFORMIN 1000 MG tablet Commonly known as: GLUCOPHAGE Take 1 tablet (1,000 mg total) by mouth 2 (two) times daily.        Discharge Exam: Danley Danker Weights   07/28/22 1849 07/29/22 0006  Weight: 91.6 kg 94.2 kg   HEENT:  [place]/AT, No thrush, no icterus CV:  RRR, no rub, no S3, no S4 Lung:  CTA, no wheeze, no rhonchi Abd:  soft/+BS, NT Ext:  No edema, no lymphangitis, no synovitis, no rash   Condition at discharge: stable  The results of significant diagnostics from this hospitalization (including imaging, microbiology, ancillary and laboratory) are listed below for reference.   Imaging Studies: US ARTERIAL ABI (SCREENING LOWER EXTREMITY)  Result Date: 07/29/2022 CLINICAL DATA:  Left toe osteomyelitis EXAM: NONINVASIVE PHYSIOLOGIC VASCULAR STUDY OF BILATERAL LOWER EXTREMITIES TECHNIQUE: Evaluation of both lower extremities were performed at rest, including calculation of ankle-brachial indices with single level Doppler, pressure and pulse volume recording. COMPARISON:  10/22/2020 FINDINGS: Right ABI:  1.47 (previously 1.31) Left ABI:  1.39 (previously 1.31) Right Lower Extremity:  Multiphasic arterial waveforms at the ankle. Left Lower Extremity:  Multiphasic arterial waveforms at the ankle. IMPRESSION: No evidence of hemodynamically significant lower extremity arterial occlusive disease at rest. Electronically Signed   By: Lucrezia Europe M.D.   On: 07/29/2022 12:48   MR FOOT LEFT WO CONTRAST  Result Date: 07/29/2022 CLINICAL DATA:  Foot swelling, diabetic, osteomyelitis suspected, xray done EXAM: MRI OF THE LEFT FOOT WITHOUT CONTRAST TECHNIQUE: Multiplanar, multisequence MR imaging of the left forefoot was performed. No intravenous contrast was administered. COMPARISON:  X-ray 07/28/2022 FINDINGS: Bones/Joint/Cartilage Acute osteomyelitis of the distal phalanx of the left third toe with bone  destruction of the distal tuft. Marked bone marrow edema and confluent low T1 signal changes within the residual distal phalanx. Subtle bone marrow edema within the middle phalanx of the third toe with preservation of the fatty T1 marrow signal. Remaining bony structures of the forefoot demonstrate normal signal. No fracture or dislocation. Ligaments Intact Lisfranc ligament.  Intact collateral ligaments. Muscles and Tendons Increased T2 signal within the intrinsic foot musculature which may reflect denervation or myositis. Intact flexor and extensor tendons. No tenosynovitis. Soft tissues Prominent soft tissue swelling and edema of the third toe. No deep soft tissue ulceration. No organized fluid collection. IMPRESSION: 1. Acute osteomyelitis of the distal phalanx of the left third toe. 2. Subtle bone marrow edema within the middle phalanx of the third toe may reflect reactive osteitis versus early acute osteomyelitis. 3. Prominent soft tissue swelling and edema of the third toe. No organized fluid collection. Electronically Signed   By: Davina Poke D.O.   On: 07/29/2022 11:40   DG Foot Complete Left  Result Date: 07/28/2022 CLINICAL DATA:  Osteomyelitis, wound at first and third toes for several weeks, has been on antibiotics EXAM: LEFT FOOT - COMPLETE 3+ VIEW COMPARISON:  02/27/2014 FINDINGS: Osseous mineralization normal. Joint spaces preserved. Bone destruction identified  at tuft of distal phalanx third toe consistent with osteomyelitis. Associated soft tissue swelling. Additional soft tissue swelling great toe. No additional bone destruction seen however. No fracture or dislocation. Thickening of the distal Achilles tendon with calcifications consistent with prior Achilles tendon injury and or surgery. IMPRESSION: Osteomyelitis at distal phalanx of LEFT third toe with associated soft tissue swelling. No additional osseous abnormalities. These results will be called to the ordering clinician or  representative by the Radiologist Assistant, and communication documented in the PACS or [employer]. Electronically Signed   By: Lavonia Dana M.D.   On: 07/28/2022 13:58    Microbiology: Results for orders placed or performed during the hospital encounter of 07/28/22  Surgical pcr screen     Status: None   Collection Time: 07/29/22 10:12 PM   Specimen: Nasal Mucosa; Nasal Swab  Result Value Ref Range Status   MRSA, PCR NEGATIVE NEGATIVE Final   Staphylococcus aureus NEGATIVE NEGATIVE Final    Comment: (NOTE) The Xpert SA Assay (FDA approved for NASAL specimens in patients 63 years of age and older), is one component of a comprehensive surveillance program. It is not intended to diagnose infection nor to guide or monitor treatment. Performed at [facility], [address], [place], [place] [address]     Labs: CBC: Recent Labs  Lab 07/28/22 1901 07/29/22 0411  WBC 8.9 6.6  NEUTROABS 5.6  --   HGB 15.6 14.9  HCT 44.7 42.9  MCV 87.0 84.8  PLT 211 993   Basic Metabolic Panel: Recent Labs  Lab 07/28/22 1901 07/29/22 0411 07/30/22 0420  NA 138 137  --   K 4.5 3.7  --   CL 102 102  --   CO2 25 27  --   GLUCOSE 307* 192*  --   BUN 15 16  --   CREATININE 0.85 0.61 0.69  CALCIUM 9.7 9.0  --   MG  --  2.2  --   PHOS  --  2.8  --    Liver Function Tests: Recent Labs  Lab 07/28/22 1901 07/29/22 0411  AST 19 16  ALT 20 18  ALKPHOS 119 98  BILITOT 0.5 0.7  PROT 7.8 7.0  ALBUMIN 4.5 3.9   CBG: Recent Labs  Lab 07/30/22 1557 07/30/22 1751 07/30/22 2044 07/30/22 2233 07/31/22 0733  GLUCAP 147* 89 226* 245* 170*    Discharge time spent: greater than 30 minutes.  Signed: Orson Eva, MD [facility] 07/31/2022

## 2022-07-31 NOTE — Progress Notes (Signed)
[facility]  Daily dressing with xeroform, gauze and ace. Post op shoe; heel weight bearing.  Will need to keep area dry, cover foot with plastic if showers to prevent maceration.  Sutures out around 2 weeks.  Curlene Labrum MD

## 2022-08-03 LAB — SURGICAL PATHOLOGY

## 2022-08-06 ENCOUNTER — Encounter (HOSPITAL_COMMUNITY): Payer: Self-pay | Admitting: General Surgery

## 2022-08-12 ENCOUNTER — Encounter: Payer: Self-pay | Admitting: *Deleted

## 2022-08-12 ENCOUNTER — Encounter: Payer: Self-pay | Admitting: General Surgery

## 2022-08-12 ENCOUNTER — Ambulatory Visit (INDEPENDENT_AMBULATORY_CARE_PROVIDER_SITE_OTHER): Payer: Medicare Other | Admitting: General Surgery

## 2022-08-12 VITALS — BP 115/78 | HR 74 | Temp 98.2°F | Resp 14 | Ht 73.0 in | Wt 214.0 lb

## 2022-08-12 DIAGNOSIS — M86172 Other acute osteomyelitis, left ankle and foot: Secondary | ICD-10-CM

## 2022-08-12 NOTE — Patient Instructions (Signed)
Do not soak your foot. Place betadine on the toe daily and cover with dry gauze.  Keep the area dry otherwise. If you shower cover the foot with a plastic bag. Do not get the foot wet.  You can wipe the rest of your foot with a wash cloth but not the incision area.

## 2022-08-12 NOTE — Patient Instructions (Signed)
  Procedure: colonoscopy  Estimated body mass index is 28.23 kg/m as calculated from the following:   Height as of this encounter: _0  (1.854 m).   Weight as of this encounter: 214 lb (97.1 kg).   Do you have family history of colon cancer?  No  Do you have a family history of polyps? No  Previous colonoscopy with polyps removed? No  Do you have a history colorectal cancer?   No  Are you diabetic?  Yes, type 2  Do you have a prosthetic or mechanical heart valve? No  Do you have a pacemaker/defibrillator?   No  Have you had endocarditis/atrial fibrillation?  No  Do you use supplemental oxygen/CPAP?  No  Have you had joint replacement within the last 12 months?  No  Do you tend to be constipated or have to use laxatives?  No   Do you have history of alcohol use? If yes, how much and how often.  No  Do you have history or are you using drugs? If yes, what do are you  using?  No  Have you ever had a stroke/heart attack?  No  Have you ever had a heart or other vascular stent placed,?No  Do you take weight loss medication? No  Do you take any blood-thinning medications such as: (Plavix, aspirin, Coumadin, Aggrenox, Brilinta, Xarelto, Eliquis, Pradaxa, Savaysa or Effient)? No  If yes we need the name, milligram, dosage and who is prescribing doctor:  n/a             Current Outpatient Medications  Medication Sig Dispense Refill   amLODipine (NORVASC) 10 MG tablet Take 10 mg by mouth daily.     atorvastatin (LIPITOR) 20 MG tablet Take 20 mg by mouth daily.     blood glucose meter kit and supplies Dispense based on patient and insurance preference. Use up to four times daily as directed. (FOR ICD-10 E10.9, E11.9). 1 each 0   citalopram (CELEXA) 40 MG tablet Take 40 mg by mouth daily.     cyclobenzaprine (FLEXERIL) 10 MG tablet Take 10 mg by mouth 3 (three) times daily.     FARXIGA 10 MG TABS tablet Take 10 mg by mouth every morning.     insulin glargine (LANTUS SOLOSTAR)  100 UNIT/ML Solostar Pen Inject 15 Units into the skin daily. 15 mL 1   Insulin Pen Needle 32G X 4 MM MISC 1 each by Does not apply route 3 (three) times daily. 100 each 1   metFORMIN (GLUCOPHAGE) 1000 MG tablet Take 1 tablet (1,000 mg total) by mouth 2 (two) times daily. 60 tablet 1   No current facility-administered medications for this visit.    Allergies  Allergen Reactions   Ace Inhibitors Rash

## 2022-08-12 NOTE — Progress Notes (Signed)
[facility]  Left toe amputation site healing but has some maceration. Remind them to not get it wet. The xerofrom may be causing some of this.   BP 115/78   Pulse 74   Temp 98.2 F (36.8 C) (Oral)   Resp 14   Ht '6\' 1"'$  (1.854 m)   Wt 214 lb (97.1 kg)   SpO2 96%   BMI 28.23 kg/m  Sutures intact, no erythema, minor maceration  Patient s/p L 3rd toe amputation for osteomyelitis. Doing well but skin a little macerated. Will dry it up some more with betadine this week.  Do not soak your foot. Place betadine on the toe daily and cover with dry gauze.  Keep the area dry otherwise. If you shower cover the foot with a plastic bag. Do not get the foot wet.  You can wipe the rest of your foot with a wash cloth but not the incision area.   Future Appointments  Date Time Provider [place]  08/17/2022  2:45 PM Virl Cagey, MD RS-RS None  09/01/2022  8:30 AM Brita Romp, NP REA-REA None   Curlene Labrum, MD [facility] [address] [place], [place] [address] [phone] (office)

## 2022-08-17 ENCOUNTER — Ambulatory Visit (INDEPENDENT_AMBULATORY_CARE_PROVIDER_SITE_OTHER): Payer: Medicare Other | Admitting: General Surgery

## 2022-08-17 ENCOUNTER — Encounter: Payer: Self-pay | Admitting: General Surgery

## 2022-08-17 VITALS — BP 133/89 | HR 79 | Temp 98.1°F | Resp 16 | Ht 73.0 in | Wt 212.0 lb

## 2022-08-17 DIAGNOSIS — M86172 Other acute osteomyelitis, left ankle and foot: Secondary | ICD-10-CM

## 2022-08-17 NOTE — Progress Notes (Unsigned)
[facility]  No issues. Betadine to the toe going well. No drainage reported.  BP 133/89   Pulse 79   Temp 98.1 F (36.7 C) (Oral)   Resp 16   Ht '6\' 1"'$  (1.854 m)   Wt 212 lb (96.2 kg)   SpO2 98%   BMI 27.97 kg/m  Left third toe sutures removed, dry scab on the lateral aspect, probed, will monitor   Patient s/p left third toe amputation. Doing fair.  Keep area dry. If you shower be sure to dry off good. No soaking. Keep betadine on the area. Watch the outside area of the incision closet to the 4th toe, if this area opens up call and let us know.  Call [phone] or [phone] for Holidays/ Weekends and ask to speak with Dr. Constance Haw or the surgeon on on call for me.   Future Appointments  Date Time Provider [place]  08/24/2022 10:30 AM Virl Cagey, MD RS-RS None  09/01/2022  8:30 AM Brita Romp, NP REA-REA None   Curlene Labrum, MD [facility] [address] [place],  [address] [phone] (office)

## 2022-08-17 NOTE — Patient Instructions (Signed)
Keep area dry. If you shower be sure to dry off good. No soaking. Keep betadine on the area. Watch the outside area of the incision closet to the 4th toe, if this area opens up call and let us know.  Call [phone] or [phone] for Holidays/ Weekends and ask to speak with Dr. Constance Haw or the surgeon on on call for me.

## 2022-08-18 NOTE — Progress Notes (Signed)
Recent hospitalization. PMH (Kleine-Levin Syndrome). Would recommend ov.

## 2022-08-24 ENCOUNTER — Encounter: Payer: Self-pay | Admitting: General Surgery

## 2022-08-24 ENCOUNTER — Ambulatory Visit (INDEPENDENT_AMBULATORY_CARE_PROVIDER_SITE_OTHER): Payer: Medicare Other | Admitting: General Surgery

## 2022-08-24 VITALS — BP 132/89 | HR 79 | Temp 98.2°F | Resp 12 | Ht 73.0 in | Wt 215.0 lb

## 2022-08-24 DIAGNOSIS — M86172 Other acute osteomyelitis, left ankle and foot: Secondary | ICD-10-CM

## 2022-08-24 NOTE — Progress Notes (Deleted)
GI Office Note    Referring Provider: Leslie Andrea, MD Primary Care Physician:  Leslie Andrea, MD  Primary Gastroenterologist: ***  Chief Complaint   No chief complaint on file.   History of Present Illness   Albert Mcdonald is a 51 y.o. male presenting today at the request of Leslie Andrea, MD for ***screening colonoscopy. 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  Patient recent hospitalization for osteomyelitis of his third left toe.  He presented to the recommendation of his PCP who performed an x-ray of his left foot which noted osteomyelitis of the third left toe with soft tissue edema.  Patient was treated with vancomycin and cefepime while inpatient.  He was discharged home with doxycycline and levofloxacin.  His home Farxiga and metformin was restarted on discharge and advised to continue sliding scale insulin.  He underwent amputation on 07/30/2022.  Seen for follow-up with Dr. Constance Haw 08/12/2022.  Noted left toe amputation site healing but with some maceration likely secondary to Xeroform.  Advised to keep wound dry and covered with Betadine and dry gauze every day.  Subsequent follow-up 08/17/2022 noting no issues to amputation site.  Advised to continue Betadine to the site.  Most recent follow-up 08/24/2022 noting healed incision, scab falling off.  No open wounds.  Advised to continue Betadine for 1 week.  Today:  Taking sliding scale insulin or only Lantus? -    Current Outpatient Medications  Medication Sig Dispense Refill   amLODipine (NORVASC) 10 MG tablet Take 10 mg by mouth daily.     atorvastatin (LIPITOR) 20 MG tablet Take 20 mg by mouth daily.     blood glucose meter kit and supplies Dispense based on patient and insurance preference. Use up to four times daily as directed. (FOR ICD-10 E10.9, E11.9). 1 each 0   citalopram (CELEXA) 40 MG tablet Take 40 mg by mouth daily.     cyclobenzaprine (FLEXERIL) 10 MG tablet Take 10  mg by mouth 3 (three) times daily.     FARXIGA 10 MG TABS tablet Take 10 mg by mouth every morning.     insulin glargine (LANTUS SOLOSTAR) 100 UNIT/ML Solostar Pen Inject 15 Units into the skin daily. 15 mL 1   Insulin Pen Needle 32G X 4 MM MISC 1 each by Does not apply route 3 (three) times daily. 100 each 1   metFORMIN (GLUCOPHAGE) 1000 MG tablet Take 1 tablet (1,000 mg total) by mouth 2 (two) times daily. 60 tablet 1   No current facility-administered medications for this visit.    Past Medical History:  Diagnosis Date   ALS (amyotrophic lateral sclerosis) ([place])    patient denied on 11/01/12 -- has KLS not ALS   Diabetes mellitus    Family history of anesthesia complication    Hyperlipidemia    Hypertension    Kleine-Levin syndrome    OCD (obsessive compulsive disorder)    Restless leg     Past Surgical History:  Procedure Laterality Date   AMPUTATION Right 02/26/2022   Procedure: AMPUTATION 5th TOE;  Surgeon: [name], MD;  Location: [facility] [address];  Service: Orthopedics;  Laterality: Right;   AMPUTATION TOE Left 07/30/2022   Procedure: AMPUTATION TOE, left third toe;  Surgeon: Virl Cagey, MD;  Location: [facility];  Service: General;  Laterality: Left;   ANTERIOR CERVICAL DECOMP/DISCECTOMY FUSION  11/09/2012   Procedure: ANTERIOR CERVICAL DECOMPRESSION/DISCECTOMY FUSION 3 LEVELS C4-C7 ;  Surgeon: Melina Schools, MD;  Location: [facility];  Service: Orthopedics;;  ACDF C4-C7     No family history on file.  Allergies as of 08/25/2022 - Review Complete 08/24/2022  Allergen Reaction Noted   Ace inhibitors Rash 03/16/2011    Social History   Socioeconomic History   Marital status: Single    Spouse name: Not on file   Number of children: Not on file   Years of education: Not on file   Highest education level: Not on file  Occupational History   Not on file  Tobacco Use   Smoking status: Never   Smokeless tobacco: Never  Vaping Use   Vaping Use: Never used  Substance and  Sexual Activity   Alcohol use: Yes   Drug use: No   Sexual activity: Not on file  Other Topics Concern   Not on file  Social History Narrative   Not on file   Social Determinants of Health   Financial Resource Strain: Not on file  Food Insecurity: Not on file  Transportation Needs: Not on file  Physical Activity: Not on file  Stress: Not on file  Social Connections: Not on file  Intimate Partner Violence: Not on file     Review of Systems   Gen: Denies any fever, chills, fatigue, weight loss, lack of appetite.  CV: Denies chest pain, heart palpitations, peripheral edema, syncope.  Resp: Denies shortness of breath at rest or with exertion. Denies wheezing or cough.  GI: see HPI GU : Denies urinary burning, urinary frequency, urinary hesitancy MS: Denies joint pain, muscle weakness, cramps, or limitation of movement.  Derm: Denies rash, itching, dry skin Psych: Denies depression, anxiety, memory loss, and confusion Heme: Denies bruising, bleeding, and enlarged lymph nodes.   Physical Exam   There were no vitals taken for this visit.  General:   Alert and oriented. Pleasant and cooperative. Well-nourished and well-developed.  Head:  Normocephalic and atraumatic. Eyes:  Without icterus, sclera clear and conjunctiva pink.  Ears:  Normal auditory acuity. Mouth:  No deformity or lesions, oral mucosa pink.  Lungs:  Clear to auscultation bilaterally. No wheezes, rales, or rhonchi. No distress.  Heart:  S1, S2 present without murmurs appreciated.  Abdomen:  +BS, soft, non-tender and non-distended. No HSM noted. No guarding or rebound. No masses appreciated.  Rectal:  Deferred  Msk:  Symmetrical without gross deformities. Normal posture. Extremities:  Without edema. Neurologic:  Alert and  oriented x4;  grossly normal neurologically. Skin:  Intact without significant lesions or rashes. Psych:  Alert and cooperative. Normal mood and affect.   Assessment   EASTIN SWING  is a 51 y.o. male with a history of Kleine-Levin Syndrome, diabetes, HLD, HTN, restless leg, and OCD*** presenting today for evaluation prior to scheduling screening colonoscopy.  Screening for colon cancer:    PLAN   *** Proceed with colonoscopy with propofol by Dr. Marland Kitchen in near future: the risks, benefits, and alternatives have been discussed with the patient in detail. The patient states understanding and desires to proceed. ASA 3 (patient has Shirley Friar syndrome, hyperinsomnia and poorly controlled diabetes ) Hold metformin the night prior to morning of procedure Hold Farxiga for 3 days prior Half normal dose of Lantus the night prior Continue sliding scale based on blood sugar***    Venetia Night, MSN, FNP-BC, AGACNP-BC [facility]

## 2022-08-24 NOTE — Patient Instructions (Signed)
Toe site healed. Betadine for the next week. Gauze. Do not soak feet. Keep eye on the toes.

## 2022-08-24 NOTE — Progress Notes (Signed)
[facility]  Third toe incision healed. Looking dry. Scab is off. No openings noted.  BP 132/89   Pulse 79   Temp 98.2 F (36.8 C) (Oral)   Resp 12   Ht '6\' 1"'$  (1.854 m)   Wt 215 lb (97.5 kg)   SpO2 97%   BMI 28.37 kg/m    Toe site healed. Betadine for the next week. Gauze. Do not soak feet. Keep eye on the toes.   Curlene Labrum, MD [facility] [address] [place], [place] [address] [phone] (office)

## 2022-08-25 ENCOUNTER — Inpatient Hospital Stay: Payer: Medicare Other | Admitting: Gastroenterology

## 2022-08-25 NOTE — Progress Notes (Unsigned)
GI Office Note    Referring Provider: Leslie Andrea, MD Primary Care Physician:  Leslie Andrea, MD  Primary Gastroenterologist: Cristopher Estimable.Rourk, MD   Chief Complaint   Chief Complaint  Patient presents with   Colonoscopy    Colonoscopy screening    History of Present Illness   Albert Mcdonald is a 51 y.o. male presenting today at the request of Leslie Andrea, MD for screening colonoscopy. 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   Patient recent hospitalization for osteomyelitis of his third left toe.  He presented to the recommendation of his PCP who performed an x-ray of his left foot which noted osteomyelitis of the third left toe with soft tissue edema.  Patient was treated with vancomycin and cefepime while inpatient.  He was discharged home with doxycycline and levofloxacin.  His home Farxiga and metformin was restarted on discharge and advised to continue sliding scale insulin.  He underwent amputation on 07/30/2022.  Seen for follow-up with Dr. Constance Haw 08/12/2022.  Noted left toe amputation site healing but with some maceration likely secondary to Xeroform.  Advised to keep wound dry and covered with Betadine and dry gauze every day.  Subsequent follow-up 08/17/2022 noting no issues to amputation site.  Advised to continue Betadine to the site.  Most recent follow-up 08/24/2022 noting healed incision, scab falling off.  No open wounds.  Advised to continue Betadine for 1 week.    Today: Blood sugars have been good. Taking lantus nightly 15 units. Denies abdominal pain, melena, brbpr, nausea, vomiting, dysphagia, reflux, constipation, diarrhea, weight loss, lack of appetite. Stays pretty active. Sleeps pretty good. Is a light sleeper. Denies chest pain or shortness of breath.    Current Outpatient Medications  Medication Sig Dispense Refill   amLODipine (NORVASC) 10 MG tablet Take 10 mg by mouth daily.     atorvastatin (LIPITOR) 20 MG  tablet Take 20 mg by mouth daily.     blood glucose meter kit and supplies Dispense based on patient and insurance preference. Use up to four times daily as directed. (FOR ICD-10 E10.9, E11.9). 1 each 0   citalopram (CELEXA) 40 MG tablet Take 40 mg by mouth daily.     cyclobenzaprine (FLEXERIL) 10 MG tablet Take 10 mg by mouth 3 (three) times daily.     FARXIGA 10 MG TABS tablet Take 10 mg by mouth every morning.     insulin glargine (LANTUS SOLOSTAR) 100 UNIT/ML Solostar Pen Inject 15 Units into the skin daily. 15 mL 1   Insulin Pen Needle 32G X 4 MM MISC 1 each by Does not apply route 3 (three) times daily. 100 each 1   metFORMIN (GLUCOPHAGE) 1000 MG tablet Take 1 tablet (1,000 mg total) by mouth 2 (two) times daily. 60 tablet 1   No current facility-administered medications for this visit.    Past Medical History:  Diagnosis Date   ALS (amyotrophic lateral sclerosis) ([place])    patient denied on 11/01/12 -- has KLS not ALS   Diabetes mellitus    Family history of anesthesia complication    Hyperlipidemia    Hypertension    Kleine-Levin syndrome    OCD (obsessive compulsive disorder)    Restless leg     Past Surgical History:  Procedure Laterality Date   AMPUTATION Right 02/26/2022   Procedure: AMPUTATION 5th TOE;  Surgeon: [name], MD;  Location: [place];  Service: Orthopedics;  Laterality: Right;   AMPUTATION TOE Left 07/30/2022   Procedure:  AMPUTATION TOE, left third toe;  Surgeon: Virl Cagey, MD;  Location: [facility];  Service: General;  Laterality: Left;   ANTERIOR CERVICAL DECOMP/DISCECTOMY FUSION  11/09/2012   Procedure: ANTERIOR CERVICAL DECOMPRESSION/DISCECTOMY FUSION 3 LEVELS C4-C7 ;  Surgeon: Melina Schools, MD;  Location: [place];  Service: Orthopedics;;  ACDF C4-C7     No family history on file.  Allergies as of 08/26/2022 - Review Complete 08/26/2022  Allergen Reaction Noted   Ace inhibitors Rash 03/16/2011    Social History   Socioeconomic History    Marital status: Single    Spouse name: Not on file   Number of children: Not on file   Years of education: Not on file   Highest education level: Not on file  Occupational History   Not on file  Tobacco Use   Smoking status: Never   Smokeless tobacco: Never  Vaping Use   Vaping Use: Never used  Substance and Sexual Activity   Alcohol use: Yes   Drug use: No   Sexual activity: Not on file  Other Topics Concern   Not on file  Social History Narrative   Not on file   Social Determinants of Health   Financial Resource Strain: Not on file  Food Insecurity: Not on file  Transportation Needs: Not on file  Physical Activity: Not on file  Stress: Not on file  Social Connections: Not on file  Intimate Partner Violence: Not on file     Review of Systems   Gen: Denies any fever, chills, fatigue, weight loss, lack of appetite.  CV: Denies chest pain, heart palpitations, peripheral edema, syncope.  Resp: Denies shortness of breath at rest or with exertion. Denies wheezing or cough.  GI: see HPI GU : Denies urinary burning, urinary frequency, urinary hesitancy MS: Denies joint pain, muscle weakness, cramps, or limitation of movement.  Derm: Denies rash, itching, dry skin Psych: Denies depression, anxiety, memory loss, and confusion Heme: Denies bruising, bleeding, and enlarged lymph nodes.   Physical Exam   BP 127/85 (BP Location: Right Arm, Patient Position: Sitting, Cuff Size: Normal)   Pulse 80   Temp 97.7 F (36.5 C) (Temporal)   Ht _0  (1.854 m)   Wt 216 lb 3.2 oz (98.1 kg)   SpO2 98%   BMI 28.52 kg/m   General:   Alert and oriented. Pleasant and cooperative. Well-nourished and well-developed.  Head:  Normocephalic and atraumatic. Eyes:  Without icterus, sclera clear and conjunctiva pink.  Ears:  Normal auditory acuity. Mouth:  No deformity or lesions, oral mucosa pink.  Lungs:  Clear to auscultation bilaterally. No wheezes, rales, or rhonchi. No distress.   Heart:  S1, S2 present without murmurs appreciated.  Abdomen:  +BS, soft, non-tender and non-distended. No HSM noted. No guarding or rebound. No masses appreciated.  Rectal:  Deferred  Msk:  Symmetrical without gross deformities. Normal posture. Extremities:  Without edema. Neurologic:  Alert and  oriented x4;  grossly normal neurologically. Skin:  Intact without significant lesions or rashes. Psych:  Alert and cooperative. Normal mood and affect.   Assessment   USTIN CRUICKSHANK is a 51 y.o. male with a history of Kleine-Levin Syndrome, diabetes, HLD, HTN, restless leg, and OCD presenting today for evaluation prior to scheduling screening colonoscopy.   Screening for colon cancer: No alarm symptoms present.  No upper or lower GI complaints.  Doing well and recovering since his third left toe potation.  Blood sugars have been stable at home.  Weight also stable.  Sleeping well overall.  Is agreeable to proceed with screening colonoscopy.  Denies any family history of colon cancer or colon polyps.   PLAN   Proceed with colonoscopy with propofol by Dr. Gala Romney in near future: the risks, benefits, and alternatives have been discussed with the patient in detail. The patient states understanding and desires to proceed. ASA 3 (patient has Shirley Friar syndrome, hyperinsomnia and poorly controlled diabetes ) Hold metformin the night prior to morning of procedure Hold Farxiga for 3 days prior Half normal dose of Lantus the night prior    Venetia Night, MSN, FNP-BC, AGACNP-BC [facility]

## 2022-08-26 ENCOUNTER — Ambulatory Visit (INDEPENDENT_AMBULATORY_CARE_PROVIDER_SITE_OTHER): Payer: Medicare Other | Admitting: Gastroenterology

## 2022-08-26 ENCOUNTER — Encounter: Payer: Self-pay | Admitting: *Deleted

## 2022-08-26 ENCOUNTER — Encounter: Payer: Self-pay | Admitting: Gastroenterology

## 2022-08-26 VITALS — BP 127/85 | HR 80 | Temp 97.7°F | Ht 73.0 in | Wt 216.2 lb

## 2022-08-26 DIAGNOSIS — Z1211 Encounter for screening for malignant neoplasm of colon: Secondary | ICD-10-CM

## 2022-08-26 MED ORDER — PEG 3350-KCL-NA BICARB-NACL 420 G PO SOLR: 4000.0000 mL | Freq: Once | ORAL | 0 refills | Status: AC

## 2022-08-26 NOTE — Patient Instructions (Addendum)
We are scheduling you for a colonoscopy in the near future with Dr. Gala Romney. We will send written instructions to you in the mail. The prep will be sent to your pharmacy.   You will need to hold your metformin the night prior to and the morning of your procedure You need to hold your Farxiga for 3 days prior to your colonoscopy The night prior to your colonoscopy you will give yourself half of your normal dose of Lantus (7 units)  Is a pleasure meeting you today!  I be have a wonderful Christmas and happy new year!  It was a pleasure to see you today. I want to create trusting relationships with patients. If you receive a survey regarding your visit,  I greatly appreciate you taking time to fill this out on paper or through your MyChart. I value your feedback.  Venetia Night, MSN, FNP-BC, AGACNP-BC [facility]

## 2022-08-27 ENCOUNTER — Encounter: Payer: Self-pay | Admitting: *Deleted

## 2022-09-01 ENCOUNTER — Ambulatory Visit: Payer: Self-pay | Admitting: Nurse Practitioner

## 2022-09-28 ENCOUNTER — Encounter (HOSPITAL_COMMUNITY): Admission: RE | Admit: 2022-09-28 | Payer: Medicare Other | Source: Ambulatory Visit

## 2022-10-01 ENCOUNTER — Ambulatory Visit (HOSPITAL_COMMUNITY): Admission: RE | Admit: 2022-10-01 | Payer: Medicare Other | Source: Home / Self Care | Admitting: Internal Medicine

## 2022-10-01 ENCOUNTER — Encounter (HOSPITAL_COMMUNITY): Admission: RE | Payer: Self-pay | Source: Home / Self Care

## 2022-10-01 SURGERY — COLONOSCOPY WITH PROPOFOL
Anesthesia: Monitor Anesthesia Care

## 2022-10-14 ENCOUNTER — Telehealth: Payer: Self-pay | Admitting: *Deleted

## 2022-10-14 NOTE — Telephone Encounter (Signed)
LMTRC ASA 3, Dr.Rourk Hold Metformin the night prior to morning of procedure Hold Farxiga for 3 days prior Half normal dose of Lantus the night prior

## 2022-10-14 NOTE — Telephone Encounter (Signed)
Please reschedule TCS from 10/01/22

## 2022-10-19 ENCOUNTER — Ambulatory Visit: Payer: Self-pay | Admitting: Nurse Practitioner

## 2022-10-19 NOTE — Patient Instructions (Incomplete)

## 2022-10-26 ENCOUNTER — Encounter: Payer: Self-pay | Admitting: *Deleted

## 2022-10-26 NOTE — Telephone Encounter (Signed)
Mailed letter

## 2022-10-28 ENCOUNTER — Ambulatory Visit (HOSPITAL_COMMUNITY)
Admission: RE | Admit: 2022-10-28 | Discharge: 2022-10-28 | Disposition: A | Discharge status: Home / Self Care | Payer: Medicare Other | Source: Ambulatory Visit | Attending: General Surgery | Admitting: General Surgery

## 2022-10-28 ENCOUNTER — Ambulatory Visit (INDEPENDENT_AMBULATORY_CARE_PROVIDER_SITE_OTHER): Payer: Medicare Other | Admitting: General Surgery

## 2022-10-28 ENCOUNTER — Encounter: Payer: Self-pay | Admitting: General Surgery

## 2022-10-28 VITALS — BP 136/92 | HR 101 | Temp 98.9°F | Resp 14 | Ht 73.0 in | Wt 216.0 lb

## 2022-10-28 DIAGNOSIS — S90425A Blister (nonthermal), left lesser toe(s), initial encounter: Secondary | ICD-10-CM

## 2022-10-28 DIAGNOSIS — L089 Local infection of the skin and subcutaneous tissue, unspecified: Secondary | ICD-10-CM | POA: Insufficient documentation

## 2022-10-28 NOTE — Progress Notes (Signed)
Highly suspicious for osteomyelitis. Will need an amputation of the 2nd toe so this does not get worse. Lets get him scheduled for a left 2nd toe amputation and he can likely go home as long as he is doing ok post operatively. If he wants to stay overnight that is fine too.

## 2022-10-28 NOTE — Patient Instructions (Addendum)
Go get Xray. We will with the results. Please take the antibiotic  If the redness or swelling is worsening then go to the ED. If there is signs of osteomyelitis (bone infection) we will have to amputate the toe.   No soaking, if you shower keep area dry as possible and pat dry after. Betadine to the wound and wrap with gauze.

## 2022-10-28 NOTE — Progress Notes (Signed)
[facility] History and Physical   Chief Complaint   Follow-up     Albert Mcdonald is a 52 y.o. male.  HPI: Albert Mcdonald is a 52 yo who I had done a 3rd teo amputation on for osteomyelitis back in November 2023. He had ABI within normal limits but is diabetic. He has noticed ulcer on his 2nd toe and some drainage for a few days. He is having to wear work boots at times and he thinks this wore on his toe.   He saw his PCP and was given clindamycin and was referred back to me for possible osteomyelitis.   Past Medical History:  Diagnosis Date   ALS (amyotrophic lateral sclerosis) ([place])    patient denied on 11/01/12 -- has KLS not ALS   Diabetes mellitus    Family history of anesthesia complication    Hyperlipidemia    Hypertension    Kleine-Levin syndrome    OCD (obsessive compulsive disorder)    Restless leg     Past Surgical History:  Procedure Laterality Date   AMPUTATION Right 02/26/2022   Procedure: AMPUTATION 5th TOE;  Surgeon: [name], MD;  Location: [place];  Service: Orthopedics;  Laterality: Right;   AMPUTATION TOE Left 07/30/2022   Procedure: AMPUTATION TOE, left third toe;  Surgeon: Virl Cagey, MD;  Location: [facility];  Service: General;  Laterality: Left;   ANTERIOR CERVICAL DECOMP/DISCECTOMY FUSION  11/09/2012   Procedure: ANTERIOR CERVICAL DECOMPRESSION/DISCECTOMY FUSION 3 LEVELS C4-C7 ;  Surgeon: Melina Schools, MD;  [facility];  Service: Orthopedics;;  ACDF C4-C7     Family History  Problem Relation Age of Onset   Cancer - Colon Neg Hx    Colon polyps Neg Hx     Social History   Tobacco Use   Smoking status: Never   Smokeless tobacco: Never  Vaping Use   Vaping Use: Never used  Substance Use Topics   Alcohol use: Yes   Drug use: No    Medications: I have reviewed the patient's current medications. Allergies as of 10/28/2022       Reactions   Ace Inhibitors Rash        Medication List        Accurate as of  October 28, 2022  1:21 PM. If you have any questions, ask your nurse or doctor.          amLODipine 10 MG tablet Commonly known as: NORVASC Take 10 mg by mouth daily.   atorvastatin 20 MG tablet Commonly known as: LIPITOR Take 20 mg by mouth daily.   blood glucose meter kit and supplies Dispense based on patient and insurance preference. Use up to four times daily as directed. (FOR ICD-10 E10.9, E11.9).   citalopram 40 MG tablet Commonly known as: CELEXA Take 40 mg by mouth daily.   clindamycin 300 MG capsule Commonly known as: CLEOCIN Take 300 mg by mouth 3 (three) times daily.   Insulin Pen Needle 32G X 4 MM Misc 1 each by Does not apply route 3 (three) times daily.   Basaglar KwikPen 100 UNIT/ML Inject 10 Units into the skin at bedtime.   Lantus SoloStar 100 UNIT/ML Solostar Pen Generic drug: insulin glargine Inject 15 Units into the skin daily.   lisinopril 40 MG tablet Commonly known as: ZESTRIL Take 40 mg by mouth daily.   metFORMIN 1000 MG tablet Commonly known as: GLUCOPHAGE Take 1 tablet (1,000 mg total) by mouth 2 (two) times daily.  ROS:  A comprehensive review of systems was negative except for: Musculoskeletal: positive for drainage left 2nd toe with drainage and swelling  Blood pressure (!) 136/92, pulse (!) 101, temperature 98.9 F (37.2 C), temperature source Oral, resp. rate 14, height '6\' 1"'$  (1.854 m), weight 216 lb (98 kg), SpO2 96 %. Physical Exam Vitals reviewed.  HENT:     Head: Normocephalic.     Nose: Nose normal.  Eyes:     Extraocular Movements: Extraocular movements intact.  Cardiovascular:     Rate and Rhythm: Normal rate.     Pulses:          Popliteal pulses are 2+ on the left side.     Comments: Unable to palpate the DP on left today Pulmonary:     Effort: Pulmonary effort is normal.  Abdominal:     General: There is no distension.     Palpations: Abdomen is soft.     Tenderness: There is no abdominal  tenderness.  Musculoskeletal:        General: Normal range of motion.     Comments: Left 2nd toe swollen and red, drainage from the tip at the plantar surface, ulcer excised with scissors and purulence expressed, culture obtained, betadine placed to the wound and toe, covered with dry gauze   Skin:    General: Skin is warm.  Neurological:     General: No focal deficit present.     Mental Status: He is alert.  Psychiatric:        Mood and Affect: Mood normal.        Behavior: Behavior normal.     Results: CLINICAL DATA:  Left toe osteomyelitis   EXAM: NONINVASIVE PHYSIOLOGIC VASCULAR STUDY OF BILATERAL LOWER EXTREMITIES   TECHNIQUE: Evaluation of both lower extremities were performed at rest, including calculation of ankle-brachial indices with single level Doppler, pressure and pulse volume recording.   COMPARISON:  10/22/2020   FINDINGS: Right ABI:  1.47 (previously 1.31)   Left ABI:  1.39 (previously 1.31)   Right Lower Extremity:  Multiphasic arterial waveforms at the ankle.   Left Lower Extremity:  Multiphasic arterial waveforms at the ankle.   IMPRESSION: No evidence of hemodynamically significant lower extremity arterial occlusive disease at rest.     Electronically Signed   By: Lucrezia Europe M.D.   On: 07/29/2022 12:48  Assessment & Plan:  Albert Mcdonald is a 52 y.o. male with 2nd toe ulcer with infection. I am worried about osteomyelitis. This is very swollen and he will likely need amputation.   Go get Xray. We will with the results. Please take the antibiotic  If the redness or swelling is worsening then go to the ED. If there is signs of osteomyelitis (bone infection) we will have to amputate the toe.   No soaking, if you shower keep area dry as possible and pat dry after. Betadine to the wound and wrap with gauze.   All questions were answered to the satisfaction of the patient.  Discussed risk of bleeding, infection, worsening infection,  continued issues with ulcers and wound on foot.   Virl Cagey 10/28/2022, 1:21 PM

## 2022-10-29 ENCOUNTER — Other Ambulatory Visit: Payer: Self-pay

## 2022-10-29 ENCOUNTER — Encounter (HOSPITAL_COMMUNITY): Payer: Self-pay | Admitting: General Surgery

## 2022-10-29 DIAGNOSIS — M869 Osteomyelitis, unspecified: Secondary | ICD-10-CM | POA: Insufficient documentation

## 2022-10-29 NOTE — H&P (Signed)
[facility] History and Physical     Chief Complaint   Follow-up        Albert Mcdonald is a 52 y.o. male.  HPI: Mr. Albert Mcdonald is a 52 yo who I had done a 3rd teo amputation on for osteomyelitis back in November 2023. He had ABI within normal limits but is diabetic. He has noticed ulcer on his 2nd toe and some drainage for a few days. He is having to wear work boots at times and he thinks this wore on his toe.    He saw his PCP and was given clindamycin and was referred back to me for possible osteomyelitis.        Past Medical History:  Diagnosis Date   ALS (amyotrophic lateral sclerosis) ([place])      patient denied on 11/01/12 -- has KLS not ALS   Diabetes mellitus     Family history of anesthesia complication     Hyperlipidemia     Hypertension     Kleine-Levin syndrome     OCD (obsessive compulsive disorder)     Restless leg             Past Surgical History:  Procedure Laterality Date   AMPUTATION Right 02/26/2022    Procedure: AMPUTATION 5th TOE;  Surgeon: [name], MD;  Location: [place];  Service: Orthopedics;  Laterality: Right;   AMPUTATION TOE Left 07/30/2022    Procedure: AMPUTATION TOE, left third toe;  Surgeon: Virl Cagey, MD;  Location: [facility];  Service: General;  Laterality: Left;   ANTERIOR CERVICAL DECOMP/DISCECTOMY FUSION   11/09/2012    Procedure: ANTERIOR CERVICAL DECOMPRESSION/DISCECTOMY FUSION 3 LEVELS C4-C7 ;  Surgeon: Melina Schools, MD;  [facility];  Service: Orthopedics;;  ACDF C4-C7            Family History  Problem Relation Age of Onset   Cancer - Colon Neg Hx     Colon polyps Neg Hx        Social History        Tobacco Use   Smoking status: Never   Smokeless tobacco: Never  Vaping Use   Vaping Use: Never used  Substance Use Topics   Alcohol use: Yes   Drug use: No      Medications: I have reviewed the patient's current medications. Allergies as of 10/28/2022         Reactions    Ace Inhibitors Rash             Medication List           Accurate as of October 28, 2022  1:21 PM. If you have any questions, ask your nurse or doctor.              amLODipine 10 MG tablet Commonly known as: NORVASC Take 10 mg by mouth daily.    atorvastatin 20 MG tablet Commonly known as: LIPITOR Take 20 mg by mouth daily.    blood glucose meter kit and supplies Dispense based on patient and insurance preference. Use up to four times daily as directed. (FOR ICD-10 E10.9, E11.9).    citalopram 40 MG tablet Commonly known as: CELEXA Take 40 mg by mouth daily.    clindamycin 300 MG capsule Commonly known as: CLEOCIN Take 300 mg by mouth 3 (three) times daily.    Insulin Pen Needle 32G X 4 MM Misc 1 each by Does not apply route 3 (three) times daily.    Basaglar  KwikPen 100 UNIT/ML Inject 10 Units into the skin at bedtime.    Lantus SoloStar 100 UNIT/ML Solostar Pen Generic drug: insulin glargine Inject 15 Units into the skin daily.    lisinopril 40 MG tablet Commonly known as: ZESTRIL Take 40 mg by mouth daily.    metFORMIN 1000 MG tablet Commonly known as: GLUCOPHAGE Take 1 tablet (1,000 mg total) by mouth 2 (two) times daily.               ROS:  A comprehensive review of systems was negative except for: Musculoskeletal: positive for drainage left 2nd toe with drainage and swelling   Blood pressure (!) 136/92, pulse (!) 101, temperature 98.9 F (37.2 C), temperature source Oral, resp. rate 14, height '6\' 1"'$  (1.854 m), weight 216 lb (98 kg), SpO2 96 %. Physical Exam Vitals reviewed.  HENT:     Head: Normocephalic.     Nose: Nose normal.  Eyes:     Extraocular Movements: Extraocular movements intact.  Cardiovascular:     Rate and Rhythm: Normal rate.     Pulses:          Popliteal pulses are 2+ on the left side.     Comments: Unable to palpate the DP on left today Pulmonary:     Effort: Pulmonary effort is normal.  Abdominal:     General: There is no distension.      Palpations: Abdomen is soft.     Tenderness: There is no abdominal tenderness.  Musculoskeletal:        General: Normal range of motion.     Comments: Left 2nd toe swollen and red, drainage from the tip at the plantar surface, ulcer excised with scissors and purulence expressed, culture obtained, betadine placed to the wound and toe, covered with dry gauze   Skin:    General: Skin is warm.  Neurological:     General: No focal deficit present.     Mental Status: He is alert.  Psychiatric:        Mood and Affect: Mood normal.        Behavior: Behavior normal.        Results: CLINICAL DATA:  Left toe osteomyelitis   EXAM: NONINVASIVE PHYSIOLOGIC VASCULAR STUDY OF BILATERAL LOWER EXTREMITIES   TECHNIQUE: Evaluation of both lower extremities were performed at rest, including calculation of ankle-brachial indices with single level Doppler, pressure and pulse volume recording.   COMPARISON:  10/22/2020   FINDINGS: Right ABI:  1.47 (previously 1.31)   Left ABI:  1.39 (previously 1.31)   Right Lower Extremity:  Multiphasic arterial waveforms at the ankle.   Left Lower Extremity:  Multiphasic arterial waveforms at the ankle.   IMPRESSION: No evidence of hemodynamically significant lower extremity arterial occlusive disease at rest.     Electronically Signed   By: Lucrezia Europe M.D.   On: 07/29/2022 12:48   Assessment & Plan:  Albert Mcdonald is a 52 y.o. male with 2nd toe ulcer with infection. I am worried about osteomyelitis. This is very swollen and he will likely need amputation.    Go get Xray. We will with the results. Please take the antibiotic  If the redness or swelling is worsening then go to the ED. If there is signs of osteomyelitis (bone infection) we will have to amputate the toe.    No soaking, if you shower keep area dry as possible and pat dry after. Betadine to the wound and wrap with gauze.    All  questions were answered to the satisfaction of  the patient.   Discussed risk of bleeding, infection, worsening infection, continued issues with ulcers and wound on foot.     Virl Cagey 10/28/2022, 1:21 PM

## 2022-11-01 ENCOUNTER — Encounter (HOSPITAL_COMMUNITY): Payer: Self-pay | Admitting: General Surgery

## 2022-11-01 ENCOUNTER — Other Ambulatory Visit: Payer: Self-pay

## 2022-11-01 ENCOUNTER — Encounter (HOSPITAL_COMMUNITY)
Admission: RE | Disposition: A | Discharge status: Home / Self Care | Payer: Self-pay | Source: Home / Self Care | Attending: General Surgery

## 2022-11-01 ENCOUNTER — Ambulatory Visit (HOSPITAL_BASED_OUTPATIENT_CLINIC_OR_DEPARTMENT_OTHER): Payer: Medicare Other | Admitting: Certified Registered"

## 2022-11-01 ENCOUNTER — Ambulatory Visit (HOSPITAL_COMMUNITY): Payer: Medicare Other | Admitting: Certified Registered"

## 2022-11-01 ENCOUNTER — Ambulatory Visit (HOSPITAL_COMMUNITY)
Admission: RE | Admit: 2022-11-01 | Discharge: 2022-11-01 | Disposition: A | Discharge status: Home / Self Care | Payer: Medicare Other | Attending: General Surgery | Admitting: General Surgery

## 2022-11-01 DIAGNOSIS — F418 Other specified anxiety disorders: Secondary | ICD-10-CM | POA: Diagnosis not present

## 2022-11-01 DIAGNOSIS — E1169 Type 2 diabetes mellitus with other specified complication: Secondary | ICD-10-CM | POA: Diagnosis not present

## 2022-11-01 DIAGNOSIS — M869 Osteomyelitis, unspecified: Secondary | ICD-10-CM | POA: Insufficient documentation

## 2022-11-01 DIAGNOSIS — L97529 Non-pressure chronic ulcer of other part of left foot with unspecified severity: Secondary | ICD-10-CM | POA: Insufficient documentation

## 2022-11-01 DIAGNOSIS — Z794 Long term (current) use of insulin: Secondary | ICD-10-CM

## 2022-11-01 DIAGNOSIS — E785 Hyperlipidemia, unspecified: Secondary | ICD-10-CM | POA: Insufficient documentation

## 2022-11-01 DIAGNOSIS — F419 Anxiety disorder, unspecified: Secondary | ICD-10-CM | POA: Diagnosis not present

## 2022-11-01 DIAGNOSIS — Z7984 Long term (current) use of oral hypoglycemic drugs: Secondary | ICD-10-CM | POA: Insufficient documentation

## 2022-11-01 DIAGNOSIS — E11621 Type 2 diabetes mellitus with foot ulcer: Secondary | ICD-10-CM | POA: Insufficient documentation

## 2022-11-01 DIAGNOSIS — F32A Depression, unspecified: Secondary | ICD-10-CM | POA: Insufficient documentation

## 2022-11-01 DIAGNOSIS — I10 Essential (primary) hypertension: Secondary | ICD-10-CM | POA: Diagnosis not present

## 2022-11-01 HISTORY — PX: AMPUTATION TOE: SHX6595

## 2022-11-01 LAB — WOUND CULTURE
MICRO NUMBER:: 14505511
SPECIMEN QUALITY:: ADEQUATE

## 2022-11-01 LAB — GLUCOSE, CAPILLARY
Glucose-Capillary: 275 mg/dL — ABNORMAL HIGH (ref 70–99)
Glucose-Capillary: 288 mg/dL — ABNORMAL HIGH (ref 70–99)

## 2022-11-01 SURGERY — AMPUTATION, TOE
Anesthesia: General | Site: Toe | Laterality: Left

## 2022-11-01 MED ORDER — ONDANSETRON HCL 4 MG PO TABS: 4.0000 mg | ORAL_TABLET | Freq: Three times a day (TID) | ORAL | 1 refills | Status: DC | PRN

## 2022-11-01 MED ORDER — CHLORHEXIDINE GLUCONATE CLOTH 2 % EX PADS: 6.0000 | MEDICATED_PAD | Freq: Once | CUTANEOUS | Status: DC

## 2022-11-01 MED ORDER — PROPOFOL 500 MG/50ML IV EMUL
INTRAVENOUS | Status: AC
  Filled 2022-11-01: qty 50

## 2022-11-01 MED ORDER — EPHEDRINE SULFATE-NACL 50-0.9 MG/10ML-% IV SOSY
PREFILLED_SYRINGE | INTRAVENOUS | Status: DC | PRN
  Administered 2022-11-01: 5 mg via INTRAVENOUS

## 2022-11-01 MED ORDER — SODIUM CHLORIDE 0.9 % IR SOLN
Status: DC | PRN
  Administered 2022-11-01: 1000 mL

## 2022-11-01 MED ORDER — FENTANYL CITRATE PF 50 MCG/ML IJ SOSY: 25.0000 ug | PREFILLED_SYRINGE | INTRAMUSCULAR | Status: DC | PRN

## 2022-11-01 MED ORDER — ONDANSETRON HCL 4 MG/2ML IJ SOLN: 4.0000 mg | Freq: Once | INTRAMUSCULAR | Status: DC | PRN

## 2022-11-01 MED ORDER — ONDANSETRON HCL 4 MG/2ML IJ SOLN
INTRAMUSCULAR | Status: DC | PRN
  Administered 2022-11-01: 4 mg via INTRAVENOUS

## 2022-11-01 MED ORDER — CHLORHEXIDINE GLUCONATE 0.12 % MT SOLN: 15.0000 mL | Freq: Once | OROMUCOSAL | Status: DC

## 2022-11-01 MED ORDER — EPHEDRINE 5 MG/ML INJ
INTRAVENOUS | Status: AC
  Filled 2022-11-01: qty 5

## 2022-11-01 MED ORDER — MIDAZOLAM HCL 2 MG/2ML IJ SOLN
INTRAMUSCULAR | Status: AC
  Filled 2022-11-01: qty 2

## 2022-11-01 MED ORDER — DEXAMETHASONE SODIUM PHOSPHATE 10 MG/ML IJ SOLN
INTRAMUSCULAR | Status: AC
  Filled 2022-11-01: qty 1

## 2022-11-01 MED ORDER — PROPOFOL 10 MG/ML IV BOLUS
INTRAVENOUS | Status: DC | PRN
  Administered 2022-11-01: 40 mg via INTRAVENOUS
  Administered 2022-11-01: 50 mg via INTRAVENOUS

## 2022-11-01 MED ORDER — TRIPLE ANTIBIOTIC 3.5-400-5000 EX OINT
TOPICAL_OINTMENT | CUTANEOUS | Status: DC | PRN
  Administered 2022-11-01: 1

## 2022-11-01 MED ORDER — LACTATED RINGERS IV SOLN: INTRAVENOUS | Status: DC

## 2022-11-01 MED ORDER — CEFAZOLIN SODIUM-DEXTROSE 2-4 GM/100ML-% IV SOLN
2.0000 g | INTRAVENOUS | Status: AC
  Administered 2022-11-01: 2 g via INTRAVENOUS

## 2022-11-01 MED ORDER — PHENYLEPHRINE 80 MCG/ML (10ML) SYRINGE FOR IV PUSH (FOR BLOOD PRESSURE SUPPORT)
PREFILLED_SYRINGE | INTRAVENOUS | Status: DC | PRN
  Administered 2022-11-01: 160 ug via INTRAVENOUS

## 2022-11-01 MED ORDER — LIDOCAINE HCL (PF) 1 % IJ SOLN
INTRAMUSCULAR | Status: DC | PRN
  Administered 2022-11-01: 20 mL

## 2022-11-01 MED ORDER — ONDANSETRON HCL 4 MG/2ML IJ SOLN
INTRAMUSCULAR | Status: AC
  Filled 2022-11-01: qty 2

## 2022-11-01 MED ORDER — OXYCODONE HCL 5 MG PO TABS: 5.0000 mg | ORAL_TABLET | Freq: Once | ORAL | Status: DC | PRN

## 2022-11-01 MED ORDER — FENTANYL CITRATE (PF) 100 MCG/2ML IJ SOLN
INTRAMUSCULAR | Status: AC
  Filled 2022-11-01: qty 2

## 2022-11-01 MED ORDER — LIDOCAINE HCL (PF) 2 % IJ SOLN
INTRAMUSCULAR | Status: AC
  Filled 2022-11-01: qty 5

## 2022-11-01 MED ORDER — AMOXICILLIN-POT CLAVULANATE 875-125 MG PO TABS: 1.0000 | ORAL_TABLET | Freq: Two times a day (BID) | ORAL | 0 refills | Status: AC

## 2022-11-01 MED ORDER — CHLORHEXIDINE GLUCONATE 0.12 % MT SOLN
OROMUCOSAL | Status: AC
  Filled 2022-11-01: qty 15

## 2022-11-01 MED ORDER — MIDAZOLAM HCL 5 MG/5ML IJ SOLN
INTRAMUSCULAR | Status: DC | PRN
  Administered 2022-11-01: 2 mg via INTRAVENOUS

## 2022-11-01 MED ORDER — ORAL CARE MOUTH RINSE: 15.0000 mL | Freq: Once | OROMUCOSAL | Status: DC

## 2022-11-01 MED ORDER — DEXMEDETOMIDINE HCL IN NACL 80 MCG/20ML IV SOLN
INTRAVENOUS | Status: AC
  Filled 2022-11-01: qty 20

## 2022-11-01 MED ORDER — CEFAZOLIN SODIUM-DEXTROSE 2-4 GM/100ML-% IV SOLN
INTRAVENOUS | Status: AC
  Filled 2022-11-01: qty 100

## 2022-11-01 MED ORDER — OXYCODONE HCL 5 MG PO TABS: 5.0000 mg | ORAL_TABLET | ORAL | 0 refills | Status: DC | PRN

## 2022-11-01 MED ORDER — FENTANYL CITRATE (PF) 100 MCG/2ML IJ SOLN
INTRAMUSCULAR | Status: DC | PRN
  Administered 2022-11-01: 50 ug via INTRAVENOUS

## 2022-11-01 MED ORDER — OXYCODONE HCL 5 MG/5ML PO SOLN: 5.0000 mg | Freq: Once | ORAL | Status: DC | PRN

## 2022-11-01 MED ORDER — LIDOCAINE HCL (PF) 1 % IJ SOLN
INTRAMUSCULAR | Status: AC
  Filled 2022-11-01: qty 30

## 2022-11-01 MED ORDER — PROPOFOL 500 MG/50ML IV EMUL
INTRAVENOUS | Status: DC | PRN
  Administered 2022-11-01: 75 ug/kg/min via INTRAVENOUS

## 2022-11-01 MED ORDER — PHENYLEPHRINE 80 MCG/ML (10ML) SYRINGE FOR IV PUSH (FOR BLOOD PRESSURE SUPPORT)
PREFILLED_SYRINGE | INTRAVENOUS | Status: AC
  Filled 2022-11-01: qty 10

## 2022-11-01 MED ORDER — TRIPLE ANTIBIOTIC 3.5-400-5000 EX OINT
TOPICAL_OINTMENT | CUTANEOUS | Status: AC
  Filled 2022-11-01: qty 1

## 2022-11-01 MED ORDER — LACTATED RINGERS IV SOLN: INTRAVENOUS | Status: DC | PRN

## 2022-11-01 MED ORDER — BUPIVACAINE HCL (PF) 0.5 % IJ SOLN
INTRAMUSCULAR | Status: AC
  Filled 2022-11-01: qty 30

## 2022-11-01 SURGICAL SUPPLY — 34 items
BLADE SURG 15 STRL LF DISP TIS (BLADE) ×1 IMPLANT
BLADE SURG 15 STRL SS (BLADE) ×1
BNDG CMPR STD VLCR NS LF 5.8X4 (GAUZE/BANDAGES/DRESSINGS) ×1
BNDG CONFORM 2 STRL LF (GAUZE/BANDAGES/DRESSINGS) ×1 IMPLANT
BNDG ELASTIC 4X5.8 VLCR NS LF (GAUZE/BANDAGES/DRESSINGS) ×1 IMPLANT
BNDG ELASTIC 4X5.8 VLCR STR LF (GAUZE/BANDAGES/DRESSINGS) IMPLANT
BNDG GAUZE DERMACEA FLUFF 4 (GAUZE/BANDAGES/DRESSINGS) IMPLANT
BNDG GAUZE ELAST 4 BULKY (GAUZE/BANDAGES/DRESSINGS) ×1 IMPLANT
BNDG GZE DERMACEA 4 6PLY (GAUZE/BANDAGES/DRESSINGS) ×2
CLOTH BEACON ORANGE TIMEOUT ST (SAFETY) ×1 IMPLANT
COVER LIGHT HANDLE STERIS (MISCELLANEOUS) ×2 IMPLANT
ELECT REM PT RETURN 9FT ADLT (ELECTROSURGICAL) ×1
ELECTRODE REM PT RTRN 9FT ADLT (ELECTROSURGICAL) ×1 IMPLANT
GAUZE SPONGE 4X4 12PLY STRL (GAUZE/BANDAGES/DRESSINGS) ×2 IMPLANT
GAUZE XEROFORM 1X8 LF (GAUZE/BANDAGES/DRESSINGS) IMPLANT
GAUZE XEROFORM 5X9 LF (GAUZE/BANDAGES/DRESSINGS) IMPLANT
GLOVE BIO SURGEON STRL SZ 6.5 (GLOVE) ×1 IMPLANT
GLOVE BIO SURGEON STRL SZ7 (GLOVE) IMPLANT
GLOVE BIOGEL PI IND STRL 6.5 (GLOVE) ×1 IMPLANT
GLOVE BIOGEL PI IND STRL 7.0 (GLOVE) ×2 IMPLANT
GOWN STRL REUS W/TWL LRG LVL3 (GOWN DISPOSABLE) ×2 IMPLANT
KIT TURNOVER KIT A (KITS) ×1 IMPLANT
MANIFOLD NEPTUNE II (INSTRUMENTS) ×1 IMPLANT
NDL HYPO 25X1 1.5 SAFETY (NEEDLE) ×1 IMPLANT
NEEDLE HYPO 25X1 1.5 SAFETY (NEEDLE) ×1 IMPLANT
NS IRRIG 1000ML POUR BTL (IV SOLUTION) ×1 IMPLANT
PACK BASIC LIMB (CUSTOM PROCEDURE TRAY) ×1 IMPLANT
PAD ARMBOARD 7.5X6 YLW CONV (MISCELLANEOUS) ×1 IMPLANT
SET BASIN LINEN APH (SET/KITS/TRAYS/PACK) ×1 IMPLANT
SOL PREP PROV IODINE SCRUB 4OZ (MISCELLANEOUS) IMPLANT
SPONGE T-LAP 18X18 ~~LOC~~+RFID (SPONGE) ×1 IMPLANT
SUT PROLENE 2 0 FS (SUTURE) IMPLANT
SUT VIC AB 2-0 CT2 27 (SUTURE) IMPLANT
SYR CONTROL 10ML LL (SYRINGE) ×1 IMPLANT

## 2022-11-01 NOTE — Interval H&P Note (Signed)
History and Physical Interval Note:  11/01/2022 8:17 AM  Albert Mcdonald  has presented today for surgery, with the diagnosis of OSTEOMYELITIS LEFT SECOND TOE.  The various methods of treatment have been discussed with the patient and family. After consideration of risks, benefits and other options for treatment, the patient has consented to  Procedure(s): AMPUTATION TOE, LEFT SECOND (Left) as a surgical intervention.  The patient's history has been reviewed, patient examined, no change in status, stable for surgery.  I have reviewed the patient's chart and labs.  Questions were answered to the patient's satisfaction.    Marked. Xray with concern for osteo of the 2nd toe distal phalanx based on imaging and comparing to his old imaging.   Albert Mcdonald

## 2022-11-01 NOTE — Anesthesia Preprocedure Evaluation (Signed)
Anesthesia Evaluation  Patient identified by MRN, date of birth, ID band Patient awake    Reviewed: Allergy & Precautions, H&P , NPO status , Patient's Chart, lab work & pertinent test results, reviewed documented beta blocker date and time   History of Anesthesia Complications (+) Family history of anesthesia reaction  Airway Mallampati: II  TM Distance: >3 FB Neck ROM: full    Dental no notable dental hx.    Pulmonary neg pulmonary ROS   Pulmonary exam normal breath sounds clear to auscultation       Cardiovascular Exercise Tolerance: Good hypertension, negative cardio ROS  Rhythm:regular Rate:Normal     Neuro/Psych  PSYCHIATRIC DISORDERS Anxiety Depression    negative neurological ROS  negative psych ROS   GI/Hepatic negative GI ROS, Neg liver ROS,,,  Endo/Other  negative endocrine ROSdiabetes, Poorly Controlled, Type 2    Renal/GU negative Renal ROS  negative genitourinary   Musculoskeletal   Abdominal   Peds  Hematology negative hematology ROS (+)   Anesthesia Other Findings   Reproductive/Obstetrics negative OB ROS                             Anesthesia Physical Anesthesia Plan  ASA: 3  Anesthesia Plan: General and General LMA   Post-op Pain Management:    Induction:   PONV Risk Score and Plan: Ondansetron  Airway Management Planned:   Additional Equipment:   Intra-op Plan:   Post-operative Plan:   Informed Consent: I have reviewed the patients History and Physical, chart, labs and discussed the procedure including the risks, benefits and alternatives for the proposed anesthesia with the patient or authorized representative who has indicated his/her understanding and acceptance.     Dental Advisory Given  Plan Discussed with: CRNA  Anesthesia Plan Comments:        Anesthesia Quick Evaluation

## 2022-11-01 NOTE — Discharge Instructions (Addendum)
Remove the dressing in 48 hours (11/03/2022).  On 11/03/2022, You can then replace the yellow xeroform gauze (cut a small piece off and put the remaining in a ziplock to prevent it from drying out). Also replace the gauze and ACE wrap.  If you shower, try to place your foot in a bag and prevent it from getting wet. You can wipe off your foot with a rag and soap and wipe it dry. Try to bear weight on your heel. Use your post operative boot.  Start taking your Augmentin for antibiotic and stop the clindamycin. You can let Dr. Karie Kirks know we removed the toe due to osteomyelitis and that I will be seeing you 11/10/2022.  Dr. Karie Kirks may still want to see you for your diabetes or other health issues.

## 2022-11-01 NOTE — Transfer of Care (Signed)
Immediate Anesthesia Transfer of Care Note  Patient: Albert Mcdonald  Procedure(s) Performed: AMPUTATION TOE, LEFT SECOND (Left: Toe)  Patient Location: PACU  Anesthesia Type:General  Level of Consciousness: awake, alert , oriented, patient cooperative, and responds to stimulation  Airway & Oxygen Therapy: Patient Spontanous Breathing  Post-op Assessment: Report given to RN, Post -op Vital signs reviewed and stable, and Patient moving all extremities X 4  Post vital signs: Reviewed and stable  Last Vitals:  Vitals Value Taken Time  BP 102/76 11/01/22 0930  Temp    Pulse 67 11/01/22 0930  Resp 12 11/01/22 0930  SpO2 99 % 11/01/22 0930  Vitals shown include unvalidated device data.  Last Pain:  Vitals:   11/01/22 0736  TempSrc: Oral  PainSc: 0-No pain      Patients Stated Pain Goal: 7 (09/20/82 4621)  Complications: No notable events documented.

## 2022-11-01 NOTE — Progress Notes (Signed)
[facility]  Patient's sister updated. Rx sent to pharmacy.  Remove the dressing in 48 hours (11/03/2022).  On 11/03/2022, You can then replace the yellow xeroform gauze (cut a small piece off and put the remaining in a ziplock to prevent it from drying out). Also replace the gauze and ACE wrap.  If you shower, try to place your foot in a bag and prevent it from getting wet. You can wipe off your foot with a rag and soap and wipe it dry. Try to bear weight on your heel. Use your post operative boot.  Start taking your Augmentin for antibiotic and stop the clindamycin. You can let Dr. Karie Kirks know we removed the toe due to osteomyelitis and that I will be seeing you 11/10/2022.   Curlene Labrum, MD [facility] [address] [place], [place] [address] [phone] (office)

## 2022-11-01 NOTE — Op Note (Signed)
[facility] Operative Note  11/01/22  Preoperative Diagnosis: Left 2nd toe osteomyelitis    Postoperative Diagnosis: Same   Procedure(s) Performed: Left toe amputation (between the proximal phalanx  and metatarsal)    Surgeon: Lanell Matar. Constance Haw, MD   Assistants: No qualified resident was available    Anesthesia: Monitored anesthesia care    Anesthesiologist: Dr. Briant Cedar, MD    Specimens:  Left 2nd toe   Estimated Blood Loss: Minimal   Blood Replacement: None    Complications: None   Wound Class: Dirty/ infected   Operative Indications: Albert Mcdonald is a 52 yo who has a toe infection and Xray concerning for osteomyelitis. We discussed amputation to control the infection and risk of bleeding, infection, needing more surgery, need to continue to care and monitor his feet.   Findings: Inflamed and edematous 2nd toe with distal trip ulcer with drainage.   Procedure: The patient was taken to the operating room and placed supine. Monitored anesthesia care was induced. Intravenous antibiotics were administered per protocol.  The left foot was prepared and draped in the usual sterile fashion.   A digital block was done with lidocaine. An incision was made around the proximal phalanx and carried down with sharp dissection. I amputated the toe at the joint between the proximal phalanx and metatarsal head as the osteomyelitis was in the distal phalanx. The cavity was irrigated. Hemostasis was achieved. I closed the tissue over the metatarsal head with 2-0 Vicryl interrupted. I closed the skin with 3-0 Prolene sutures in an interrupted vertical mattress fashion.   The incision was covered with neosporin, xeroform gauze, gauze and kerlix and ACE wrap.   All counts were correct at the end of the case. The patient was awakened from anesthesia without complication.  The patient went to the PACU in stable condition.   Curlene Labrum, MD [facility] [address] [place], [place] [address] [phone] (office)

## 2022-11-03 LAB — SURGICAL PATHOLOGY

## 2022-11-03 NOTE — Anesthesia Postprocedure Evaluation (Signed)
Anesthesia Post Note  Patient: Albert Mcdonald  Procedure(s) Performed: AMPUTATION TOE, LEFT SECOND (Left: Toe)  Patient location during evaluation: Phase II Anesthesia Type: General Level of consciousness: awake Pain management: pain level controlled Vital Signs Assessment: post-procedure vital signs reviewed and stable Respiratory status: spontaneous breathing and respiratory function stable Cardiovascular status: blood pressure returned to baseline and stable Postop Assessment: no headache and no apparent nausea or vomiting Anesthetic complications: no Comments: Late entry   No notable events documented.   Last Vitals:  Vitals:   11/01/22 0945 11/01/22 1002  BP: 109/79 109/69  Pulse: 65 67  Resp: 11 18  Temp:  36.6 C  SpO2: 100% 100%    Last Pain:  Vitals:   11/01/22 1002  TempSrc: Oral  PainSc: 0-No pain                 [name]

## 2022-11-05 ENCOUNTER — Encounter (HOSPITAL_COMMUNITY): Payer: Self-pay | Admitting: General Surgery

## 2022-11-10 ENCOUNTER — Encounter: Payer: Self-pay | Admitting: General Surgery

## 2022-11-10 ENCOUNTER — Ambulatory Visit (INDEPENDENT_AMBULATORY_CARE_PROVIDER_SITE_OTHER): Payer: Medicare Other | Admitting: General Surgery

## 2022-11-10 VITALS — BP 130/86 | HR 86 | Temp 97.8°F | Resp 14 | Ht 73.0 in | Wt 210.0 lb

## 2022-11-10 DIAGNOSIS — M86172 Other acute osteomyelitis, left ankle and foot: Secondary | ICD-10-CM

## 2022-11-10 NOTE — Patient Instructions (Signed)
Betadine to area and keep dry. Use only a dry gauze.

## 2022-11-10 NOTE — Progress Notes (Signed)
[facility]  Doing ok. Keep area clean. Has a bandage on it.  BP 130/86   Pulse 86   Temp 97.8 F (36.6 C) (Oral)   Resp 14   Ht 6' 1"$  (1.854 m)   Wt 210 lb (95.3 kg)   SpO2 96%   BMI 27.71 kg/m  Macerated edges, no erythema or drainage    Patient s/p 2nd toe amputation for osteomyelitis. Pathology with osteo. I think his bandage is causing the area to say moist.   Betadine to area and keep dry. Use only a dry gauze. No soaking Call with changes.   Future Appointments  Date Time Provider [place]  11/18/2022  2:30 PM Virl Cagey, MD RS-RS None  12/01/2022  9:00 AM Brita Romp, NP REA-REA None   Curlene Labrum, MD [facility] [address] [place], [place] [address] [phone] (office)

## 2022-11-18 ENCOUNTER — Ambulatory Visit (INDEPENDENT_AMBULATORY_CARE_PROVIDER_SITE_OTHER): Payer: Medicare Other | Admitting: General Surgery

## 2022-11-18 ENCOUNTER — Encounter: Payer: Self-pay | Admitting: General Surgery

## 2022-11-18 VITALS — BP 151/93 | HR 94 | Temp 97.8°F | Resp 14 | Ht 73.0 in | Wt 211.0 lb

## 2022-11-18 DIAGNOSIS — M86172 Other acute osteomyelitis, left ankle and foot: Secondary | ICD-10-CM

## 2022-11-18 NOTE — Patient Instructions (Addendum)
Continue your betadine for now and keep the area dry.   Will refer you to podiatry for shoe and preventative management of your toes.

## 2022-11-18 NOTE — Progress Notes (Signed)
[facility]   Doing well. His foot is not draining.  BP (!) 151/93   Pulse 94   Temp 97.8 F (36.6 C) (Oral)   Resp 14   Ht 6' 1"$  (1.854 m)   Wt 211 lb (95.7 kg)   SpO2 98%   BMI 27.84 kg/m   Sutures removed, area probed, silver nitrate to fibrinous area that is slightly dehisced     Patient sp 2nd toe amputation for osteomyelitis. He is poorly controlled diabetic. He is following up with endocrinology soon.   He needs preventative services with Podiatry to help with shoe choices and preventative options for him given his issue with pressure wounds causing him to develop ulcers and then osteo   Will refer him to Podiatry Will see him in 2 weeks to look at the small dehiscence   Continue your betadine for now and keep the area dry.     Future Appointments  Date Time Provider [place]  11/18/2022  2:30 PM Virl Cagey, MD RS-RS None  12/01/2022  9:00 AM Brita Romp, NP REA-REA None  12/09/2022  1:30 PM Virl Cagey, MD RS-RS None   Curlene Labrum, MD [facility] [address] [place], [place] [address] [phone] (office)

## 2022-11-25 ENCOUNTER — Encounter: Payer: Self-pay | Admitting: Radiology

## 2022-12-01 ENCOUNTER — Ambulatory Visit (INDEPENDENT_AMBULATORY_CARE_PROVIDER_SITE_OTHER): Payer: Medicare Other | Admitting: Nurse Practitioner

## 2022-12-01 ENCOUNTER — Encounter: Payer: Self-pay | Admitting: Nurse Practitioner

## 2022-12-01 VITALS — BP 141/88 | HR 80 | Ht 73.0 in | Wt 211.0 lb

## 2022-12-01 DIAGNOSIS — E118 Type 2 diabetes mellitus with unspecified complications: Secondary | ICD-10-CM

## 2022-12-01 LAB — POCT GLYCOSYLATED HEMOGLOBIN (HGB A1C): Hemoglobin A1C: 10.2 % — AB (ref 4.0–5.6)

## 2022-12-01 MED ORDER — LANTUS SOLOSTAR 100 UNIT/ML ~~LOC~~ SOPN: 15.0000 [IU] | PEN_INJECTOR | Freq: Every day | SUBCUTANEOUS | 1 refills | Status: DC

## 2022-12-01 NOTE — Patient Instructions (Signed)

## 2022-12-01 NOTE — Progress Notes (Signed)
Endocrinology Consult Note       12/01/2022, 9:44 AM   Subjective:    Patient ID: Albert Mcdonald, male    DOB: April 24, 1971.  Albert Mcdonald is being seen in consultation for management of currently uncontrolled symptomatic diabetes requested by  Leslie Andrea, MD.   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   Diabetes mellitus    Family history of anesthesia complication    Hyperlipidemia    Hypertension    Kleine-Levin syndrome    OCD (obsessive compulsive disorder)    Restless leg     Past Surgical History:  Procedure Laterality Date   AMPUTATION Right 02/26/2022   Procedure: AMPUTATION 5th TOE;  Surgeon: [name], MD;  Location: [place];  Service: Orthopedics;  Laterality: Right;   AMPUTATION TOE Left 07/30/2022   Procedure: AMPUTATION TOE, left third toe;  Surgeon: Virl Cagey, MD;  Location: [facility];  Service: General;  Laterality: Left;   AMPUTATION TOE Left 11/01/2022   Procedure: AMPUTATION TOE, LEFT SECOND;  Surgeon: Virl Cagey, MD;  Location: [facility];  Service: General;  Laterality: Left;   ANTERIOR CERVICAL DECOMP/DISCECTOMY FUSION  11/09/2012   Procedure: ANTERIOR CERVICAL DECOMPRESSION/DISCECTOMY FUSION 3 LEVELS C4-C7 ;  Surgeon: Melina Schools, MD;  Location: [facility];  Service: Orthopedics;;  ACDF C4-C7     Social History   Socioeconomic History   Marital status: Single    Spouse name: Not on file   Number of children: Not on file   Years of education: Not on file   Highest education level: Not on file  Occupational History   Not on file  Tobacco Use   Smoking status: Never   Smokeless tobacco: Never  Vaping Use   Vaping Use: Never used  Substance and Sexual Activity   Alcohol use: Yes   Drug use: No   Sexual activity: Not on file  Other Topics Concern   Not on file  Social  History Narrative   Not on file   Social Determinants of Health   Financial Resource Strain: Not on file  Food Insecurity: Not on file  Transportation Needs: Not on file  Physical Activity: Not on file  Stress: Not on file  Social Connections: Not on file    Family History  Problem Relation Age of Onset   Diabetes Mother    Cancer - Colon Neg Hx    Colon polyps Neg Hx     Outpatient Encounter Medications as of 12/01/2022  Medication Sig   amLODipine (NORVASC) 10 MG tablet Take 10 mg by mouth daily.   atorvastatin (LIPITOR) 20 MG tablet Take 20 mg by mouth daily.   blood glucose meter kit and supplies Dispense based on patient and insurance preference. Use up to four times daily as directed. (FOR ICD-10 E10.9, E11.9).   citalopram (CELEXA) 40 MG tablet Take 40 mg by mouth daily.   insulin glargine (LANTUS SOLOSTAR) 100 UNIT/ML Solostar Pen Inject 15 Units into the skin at bedtime.   Insulin Pen Needle 32G X 4 MM MISC 1 each by  Does not apply route 3 (three) times daily.   lisinopril (ZESTRIL) 40 MG tablet Take 40 mg by mouth daily.   metFORMIN (GLUCOPHAGE) 1000 MG tablet Take 1 tablet (1,000 mg total) by mouth 2 (two) times daily. (Patient taking differently: Take 500 mg by mouth 2 (two) times daily.)   ondansetron (ZOFRAN) 4 MG tablet Take 1 tablet (4 mg total) by mouth every 8 (eight) hours as needed.   [DISCONTINUED] insulin glargine (LANTUS SOLOSTAR) 100 UNIT/ML Solostar Pen Inject 15 Units into the skin daily. (Patient not taking: Reported on 12/01/2022)   [DISCONTINUED] oxyCODONE (ROXICODONE) 5 MG immediate release tablet Take 1 tablet (5 mg total) by mouth every 4 (four) hours as needed for severe pain or breakthrough pain.   No facility-administered encounter medications on file as of 12/01/2022.    ALLERGIES: Allergies  Allergen Reactions   Ace Inhibitors Rash    Tolerates lisinopril    VACCINATION STATUS: Immunization History  Administered Date(s) Administered    Influenza,inj,Quad PF,6+ Mos 07/08/2017   Moderna Sars-Covid-2 Vaccination 01/17/2020   Pneumococcal Polysaccharide-23 11/10/2012   Tdap 03/09/2014, 07/09/2017    Diabetes He presents for his initial diabetic visit. He has type 2 diabetes mellitus. Onset time: Diagnosed at approx age of 52. His disease course has been worsening. There are no hypoglycemic associated symptoms. Associated symptoms include blurred vision, foot paresthesias, foot ulcerations and polyuria. There are no hypoglycemic complications. Symptoms are stable. Diabetic complications include PVD (recent toe amputation). Risk factors for coronary artery disease include diabetes mellitus, male sex and hypertension. Current diabetic treatment includes oral agent (monotherapy) (only takes Lantus if glucose is above 250). He is compliant with treatment some of the time. His weight is stable. He is following a generally unhealthy diet. When asked about meal planning, he reported none. He has not had a previous visit with a dietitian. He participates in exercise daily. (He presents today for his consultation, with no meter or logs to review.  His POCT A1c today is 10.2%, increasing from previous A1c of 9.2%.  He monitors glucose every 2-3 days.  He drinks Mcdonald occasional soda, fruit drinks, and water.  He eats 2 meals per day (skipping lunch usually) and snacks between.  He stays active (has ADHD which keeps him from sitting still too long).  He is due for eye exam, has not seen podiatry in the past (sees surgeon for recent toe amputation and foot ulcer).) Mcdonald ACE inhibitor/angiotensin II receptor blocker is being taken. He does not see a podiatrist.Eye exam is not current.    Review of systems  Constitutional: + Minimally fluctuating body weight, current Body mass index is 27.84 kg/m., no fatigue, no subjective hyperthermia, no subjective hypothermia Eyes: no blurry vision, no xerophthalmia ENT: no sore throat, no nodules palpated in throat,  no dysphagia/odynophagia, no hoarseness Cardiovascular: no chest pain, no shortness of breath, no palpitations, no leg swelling Respiratory: no cough, no shortness of breath Gastrointestinal: no nausea/vomiting/diarrhea Musculoskeletal: no muscle/joint aches- recent toe amputation (left second toe) Skin: no rashes, no hyperemia Neurological: no tremors, no numbness, no tingling, no dizziness Psychiatric: no depression, no anxiety  Objective:     BP (!) 141/88   Pulse 80   Ht '6\' 1"'$  (1.854 m)   Wt 211 lb (95.7 kg)   BMI 27.84 kg/m   Wt Readings from Last 3 Encounters:  12/01/22 211 lb (95.7 kg)  11/18/22 211 lb (95.7 kg)  11/10/22 210 lb (95.3 kg)     BP Readings  from Last 3 Encounters:  12/01/22 (!) 141/88  11/18/22 (!) 151/93  11/10/22 130/86     Physical Exam- Limited  Constitutional:  Body mass index is 27.84 kg/m. , not in acute distress, distracted state of mind Eyes:  EOMI, no exophthalmos Neck: Supple Cardiovascular: RRR, no murmurs, rubs, or gallops, no edema Respiratory: Adequate breathing efforts, no crackles, rales, rhonchi, or wheezing Musculoskeletal:  strength intact in all four extremities, no gross restriction of joint movements Skin:  no rashes, no hyperemia Neurological: no tremor with outstretched hands    CMP ( most recent) CMP     Component Value Date/Time   NA 137 07/29/2022 0411   NA 142 04/09/2014 1554   K 3.7 07/29/2022 0411   CL 102 07/29/2022 0411   CO2 27 07/29/2022 0411   GLUCOSE 192 (H) 07/29/2022 0411   BUN 16 07/29/2022 0411   BUN 10 04/09/2014 1554   CREATININE 0.69 07/30/2022 0420   CALCIUM 9.0 07/29/2022 0411   PROT 7.0 07/29/2022 0411   PROT 6.9 04/09/2014 1554   ALBUMIN 3.9 07/29/2022 0411   ALBUMIN 5.1 04/09/2014 1554   AST 16 07/29/2022 0411   ALT 18 07/29/2022 0411   ALKPHOS 98 07/29/2022 0411   BILITOT 0.7 07/29/2022 0411   GFRNONAA >60 07/30/2022 0420   GFRAA >60 06/22/2019 1025     Diabetic Labs (most  recent): Lab Results  Component Value Date   HGBA1C 10.2 (A) 12/01/2022   HGBA1C 11.8 (H) 02/25/2022   HGBA1C 6.9 07/08/2017   MICROALBUR 50 06/26/2013     Lipid Panel ( most recent) Lipid Panel     Component Value Date/Time   CHOL 196 04/09/2014 1554   TRIG 312 (H) 04/09/2014 1554   TRIG 207 (H) 06/26/2013 1421   HDL 35 (L) 04/09/2014 1554   HDL 45 06/26/2013 1421   CHOLHDL 5.6 (H) 04/09/2014 1554   LDLCALC 99 04/09/2014 1554   LDLCALC 124 (H) 06/26/2013 1421   LABVLDL 62 (H) 04/09/2014 1554      Lab Results  Component Value Date   TSH 2.364 05/18/2017   TSH 1.810 04/09/2014   TSH 1.730 06/26/2013           Assessment & Plan:   1) Type 2 Diabetes with complication   He presents today for his consultation, with no meter or logs to review.  His POCT A1c today is 10.2%, increasing from previous A1c of 9.2%.  He monitors glucose every 2-3 days.  He drinks Mcdonald occasional soda, fruit drinks, and water.  He eats 2 meals per day (skipping lunch usually) and snacks between.  He stays active (has ADHD which keeps him from sitting still too long).  He is due for eye exam, has not seen podiatry in the past (sees surgeon for recent toe amputation and foot ulcer).  Albert Limes Levene has currently uncontrolled symptomatic type 2 DM since 52 years of age, with most recent A1c of 10.2 %.   -Recent labs reviewed.  - I had a long discussion with him about the progressive nature of diabetes and the pathology behind its complications. -his diabetes is complicated by diabetic foot ulcers and he remains at a high risk for more acute and chronic complications which include CAD, CVA, CKD, retinopathy, and neuropathy. These are all discussed in detail with him.  The following Lifestyle Medicine recommendations according to [facility]) were discussed and offered to patient and he agrees to start the journey:  A. Whole Foods, Plant-based plate comprising of  fruits and vegetables, plant-based proteins, whole-grain carbohydrates was discussed in detail with the patient.   A list for source of those nutrients were also provided to the patient.  Patient will use only water or unsweetened tea for hydration. B.  The need to stay away from risky substances including alcohol, smoking; obtaining 7 to 9 hours of restorative sleep, at least 150 minutes of moderate intensity exercise weekly, the importance of healthy social connections,  and stress reduction techniques were discussed. C.  A full color page of  Calorie density of various food groups per pound showing examples of each food groups was provided to the patient.  - I have counseled him on diet and weight management by adopting a carbohydrate restricted/protein rich diet. Patient is encouraged to switch to unprocessed or minimally processed complex starch and increased protein intake (animal or plant source), fruits, and vegetables. -  he is advised to stick to a routine mealtimes to eat 3 meals a day and avoid unnecessary snacks (to snack only to correct hypoglycemia).   - he acknowledges that there is a room for improvement in his food and drink choices. - Suggestion is made for him to avoid simple carbohydrates from his diet including Cakes, Sweet Desserts, Ice Cream, Soda (diet and regular), Sweet Tea, Candies, Chips, Cookies, Store Bought Juices, Alcohol in Excess of 1-2 drinks a day, Artificial Sweeteners, Coffee Creamer, and "Sugar-free" Products. This will help patient to have more stable blood glucose profile and potentially avoid unintended weight gain.  - I have approached him with the following individualized plan to manage his diabetes and patient agrees:   -He is advised to restart Lantus 15 units SQ nightly but to take it consistently.  -he is encouraged to start monitoring glucose 2 times daily, before breakfast and before bed, to log their readings on the clinic sheets provided, and bring  them to review at follow up appointment in 4 weeks.  - he is warned not to take insulin without proper monitoring per orders. - Adjustment parameters are given to him for hypo and hyperglycemia in writing. - he is encouraged to call clinic for blood glucose levels less than 70 or above 300 mg /dl. - he is advised to continue Metformin 500 mg po twice daily after meals (higher doses cause GI upset), therapeutically suitable for patient .  -He was previously on [place] but he stopped those shortly after his amputation.  - Specific targets for  A1c; LDL, HDL, and Triglycerides were discussed with the patient.  2) Blood Pressure /Hypertension:  his blood pressure is controlled to target.   he is advised to continue his current medications including Lisinopril 40 mg p.o. daily with breakfast and Norvasc 10 mg po daily.  3) Lipids/Hyperlipidemia:    There is no recent lipid panel available to review.  he is advised to continue Lipitor 20 mg daily at bedtime.  Side effects and precautions discussed with him.  4)  Weight/Diet:  his Body mass index is 27.84 kg/m.  -    he is NOT a candidate for major weight loss.  Exercise, and detailed carbohydrates information provided  -  detailed on discharge instructions.  5) Chronic Care/Health Maintenance: -he is on ACEI/ARB and Statin medications and is encouraged to initiate and continue to follow up with Ophthalmology, Dentist, Podiatrist at least yearly or according to recommendations, and advised to stay away from smoking. I have recommended yearly flu vaccine  and pneumonia vaccine at least every 5 years; moderate intensity exercise for up to 150 minutes weekly; and sleep for at least 7 hours a day.  - he is advised to maintain close follow up with Leslie Andrea, MD for primary care needs, as well as his other providers for optimal and coordinated care.   - Time spent in this patient care: 60 min, of which > 50% was spent in counseling  him about his diabetes and the rest reviewing his blood glucose logs, discussing his hypoglycemia and hyperglycemia episodes, reviewing his current and previous labs/studies (including abstraction from other facilities) and medications doses and developing a long term treatment plan based on the latest standards of care/guidelines; and documenting his care.    Please refer to Patient Instructions for Blood Glucose Monitoring and Insulin/Medications Dosing Guide" in media tab for additional information. Please also refer to "Patient Self Inventory" in the Media tab for reviewed elements of pertinent patient history.  Albert Limes Goyer participated in the discussions, expressed understanding, and voiced agreement with the above plans.  All questions were answered to his satisfaction. he is encouraged to contact clinic should he have any questions or concerns prior to his return visit.     Follow up plan: - Return in about 4 weeks (around 12/29/2022) for Diabetes F/U, Bring meter and logs.    Rayetta Pigg, [facility] [address] [place], [place] [address] Phone: [phone] Fax: [phone]  12/01/2022, 9:44 AM

## 2022-12-09 ENCOUNTER — Ambulatory Visit (INDEPENDENT_AMBULATORY_CARE_PROVIDER_SITE_OTHER): Payer: Medicare Other | Admitting: General Surgery

## 2022-12-09 VITALS — BP 152/99 | HR 76 | Temp 97.8°F | Resp 14 | Ht 73.0 in | Wt 213.0 lb

## 2022-12-09 DIAGNOSIS — M86172 Other acute osteomyelitis, left ankle and foot: Secondary | ICD-10-CM

## 2022-12-09 NOTE — Progress Notes (Signed)
[facility]  Amputation site healing. Referred him to Podiatry. He is also seeing endocrinology.  BP (!) 152/99   Pulse 76   Temp 97.8 F (36.6 C) (Oral)   Resp 14   Ht '6\' 1"'$  (1.854 m)   Wt 213 lb (96.6 kg)   SpO2 97%   BMI 28.10 kg/m  Left 2nd toe healing, no erythema, minor opening of the incision, betadine applied     Patient s/p left 2nd toe amputation for osteo. Doing well.  Continue the betadine on the open area Do not soak feet Ok to shower and pat dry  Future Appointments  Date Time Provider [place]  12/29/2022  8:00 AM Brita Romp, NP REA-REA None  01/12/2023  2:15 PM Virl Cagey, MD RS-RS None   Curlene Labrum, MD [facility] [address] [place], [place] [address] [phone] (office)

## 2022-12-09 NOTE — Patient Instructions (Signed)
Continue the betadine on the open area Do not soak feet Ok to shower and pat dry

## 2022-12-29 ENCOUNTER — Ambulatory Visit: Payer: Medicare Other | Admitting: Nurse Practitioner

## 2022-12-29 DIAGNOSIS — E118 Type 2 diabetes mellitus with unspecified complications: Secondary | ICD-10-CM

## 2023-01-12 ENCOUNTER — Encounter: Payer: Medicare Other | Admitting: General Surgery

## 2023-06-08 ENCOUNTER — Other Ambulatory Visit: Payer: Self-pay

## 2023-06-08 ENCOUNTER — Emergency Department (HOSPITAL_COMMUNITY): Payer: Medicare Other

## 2023-06-08 ENCOUNTER — Emergency Department (HOSPITAL_COMMUNITY)
Admission: EM | Admit: 2023-06-08 | Discharge: 2023-06-08 | Disposition: A | Discharge status: Home / Self Care | Payer: Medicare Other

## 2023-06-08 DIAGNOSIS — E119 Type 2 diabetes mellitus without complications: Secondary | ICD-10-CM | POA: Insufficient documentation

## 2023-06-08 DIAGNOSIS — S0990XA Unspecified injury of head, initial encounter: Secondary | ICD-10-CM | POA: Diagnosis present

## 2023-06-08 DIAGNOSIS — Z7984 Long term (current) use of oral hypoglycemic drugs: Secondary | ICD-10-CM | POA: Diagnosis not present

## 2023-06-08 DIAGNOSIS — S0081XA Abrasion of other part of head, initial encounter: Secondary | ICD-10-CM | POA: Insufficient documentation

## 2023-06-08 DIAGNOSIS — W11XXXA Fall on and from ladder, initial encounter: Secondary | ICD-10-CM | POA: Diagnosis not present

## 2023-06-08 NOTE — ED Notes (Signed)
Patient transported to CT 

## 2023-06-08 NOTE — ED Provider Notes (Signed)
[place] EMERGENCY DEPARTMENT AT [facility] Provider Note   CSN: [id number] Arrival date & time: 06/08/23  1133     History  Chief Complaint  Patient presents with   Albert Mcdonald is a 52 y.o. male.  PMH of diabetes, osteomyelitis.  Presents ER for evaluation after a fall.  States he was on a ladder, fell between 15 and 20 feet, states he started falling and pushed a ladder out of his way, rolled when he hit the ground.  Did scrape his forehead but denies other injuries or pain, due to the height of the fall he wanted to be evaluated.  He is not on blood thinners, denies headache or dizziness, no LOC.  No extremity pain, no chest abdomen or back pain, no numbness or tingling, has been able to ambulate.   Fall       Home Medications Prior to Admission medications   Medication Sig Start Date End Date Taking? Authorizing Provider  amLODipine (NORVASC) 10 MG tablet Take 10 mg by mouth daily. 06/23/22   [provider]  atorvastatin (LIPITOR) 20 MG tablet Take 20 mg by mouth daily. 02/23/22   [provider]  blood glucose meter kit and supplies Dispense based on patient and insurance preference. Use up to four times daily as directed. (FOR ICD-10 E10.9, E11.9). 02/27/22   Burnadette Pop, MD  citalopram (CELEXA) 40 MG tablet Take 40 mg by mouth daily. 06/23/22   [provider]  insulin glargine (LANTUS SOLOSTAR) 100 UNIT/ML Solostar Pen Inject 15 Units into the skin at bedtime. 12/01/22   Dani Gobble, NP  Insulin Pen Needle 32G X 4 MM MISC 1 each by Does not apply route 3 (three) times daily. 02/27/22   Burnadette Pop, MD  lisinopril (ZESTRIL) 40 MG tablet Take 40 mg by mouth daily. 08/06/22   [provider]  metFORMIN (GLUCOPHAGE) 1000 MG tablet Take 1 tablet (1,000 mg total) by mouth 2 (two) times daily. Patient taking differently: Take 500 mg by mouth 2 (two) times daily. 02/27/22   Burnadette Pop, MD  ondansetron  (ZOFRAN) 4 MG tablet Take 1 tablet (4 mg total) by mouth every 8 (eight) hours as needed. 11/01/22 11/01/23  Lucretia Roers, MD      Allergies    Ace inhibitors    Review of Systems   Review of Systems  Physical Exam Updated Vital Signs BP (!) 149/99   Pulse 63   Temp 98 F (36.7 C) (Oral)   Resp 16   Ht 6\' 1"  (1.854 m)   Wt 97.5 kg   SpO2 99%   BMI 28.37 kg/m  Physical Exam Vitals and nursing note reviewed.  Constitutional:      General: He is not in acute distress.    Appearance: He is well-developed.  HENT:     Head: Atraumatic.     Comments: Superficial abrasion right side of forehead.  No hematoma.  No step-offs    Right Ear: Tympanic membrane normal.     Left Ear: Tympanic membrane normal.  Eyes:     Conjunctiva/sclera: Conjunctivae normal.  Cardiovascular:     Rate and Rhythm: Normal rate and regular rhythm.     Heart sounds: No murmur heard. Pulmonary:     Effort: Pulmonary effort is normal. No respiratory distress.     Breath sounds: Normal breath sounds.  Abdominal:     Palpations: Abdomen is soft.     Tenderness: There is  no abdominal tenderness.  Musculoskeletal:        General: No swelling.     Cervical back: Normal range of motion and neck supple. No tenderness.  Skin:    General: Skin is warm and dry.     Capillary Refill: Capillary refill takes less than 2 seconds.  Neurological:     General: No focal deficit present.     Mental Status: He is alert and oriented to person, place, and time.  Psychiatric:        Mood and Affect: Mood normal.     ED Results / Procedures / Treatments   Labs (all labs ordered are listed, but only abnormal results are displayed) Labs Reviewed - No data to display  EKG None  Radiology CT Cervical Spine Wo Contrast  Result Date: 06/08/2023 CLINICAL DATA:  Provided history: Head trauma, moderate/severe. Neck trauma, dangerous injury mechanism. Additional history provided: Fall from ladder onto asphalt, multiple  forehead abrasions. EXAM: CT HEAD WITHOUT CONTRAST CT CERVICAL SPINE WITHOUT CONTRAST TECHNIQUE: Multidetector CT imaging of the head and cervical spine was performed following the standard protocol without intravenous contrast. Multiplanar CT image reconstructions of the cervical spine were also generated. RADIATION DOSE REDUCTION: This exam was performed according to the departmental dose-optimization program which includes automated exposure control, adjustment of the mA and/or kV according to patient size and/or use of iterative reconstruction technique. COMPARISON:  Head CT 08/31/2022. Report from cervical spine CT 03/28/2021 (images unavailable). Thyroid ultrasound examinations 08/04/2021 and 09/10/2021. FINDINGS: CT HEAD FINDINGS Brain: No age advanced or lobar predominant parenchymal atrophy. Incidentally noted is known 9 mm right tectal plate lipoma. There is no acute intracranial hemorrhage. No demarcated cortical infarct. No extra-axial fluid collection. No evidence of an intracranial mass. No midline shift. Vascular: No hyperdense vessel.  Atherosclerotic calcifications. Skull: No calvarial fracture or aggressive osseous lesion. Sinuses/Orbits: No mass or acute finding within the imaged orbits. Mild mucosal thickening within the bilateral ethmoid and left sphenoid sinuses. CT CERVICAL SPINE FINDINGS Alignment: Dextrocurvature of the thoracic spine. No significant spondylolisthesis. Skull base and vertebrae: The basion-dental and atlanto-dental intervals are maintained.No evidence of acute fracture to the cervical spine. Prior C4-C7 ACDF. No evidence of acute hardware compromise. Solid vertebral body ankylosis at C4-C5, C5-C6 and C6-C7. Fairly bulky ventral osteophytes at C3-C4 and C7-T1. Soft tissues and spinal canal: No prevertebral fluid or swelling. No visible canal hematoma. Nodules within the bilateral thyroid lobes previously assessed by thyroid ultrasound on 08/04/2021, and previously biopsied on  09/10/2021. Please correlate with the pathology reports. Disc levels: Prior C4-C7 ACDF as described above. Cervical spondylosis. Disc space narrowing at the non-operative levels, greatest at C7-T1 (moderate-to-advanced at this level). Central disc protrusions at C2-C3 and C3-C4. Disc bulge with endplate spurring at C7-T1. Multilevel uncovertebral hypertrophy. Multilevel spinal canal narrowing. Most notably at C3-C4, a central disc protrusion contributes to at least moderate spinal canal stenosis. Multilevel bony neural foraminal narrowing. Upper chest: No consolidation within the imaged lung apices. No visible pneumothorax. IMPRESSION: CT head: No evidence of an acute intracranial abnormality. CT cervical spine: 1. No evidence of an acute cervical spine fracture. 2. Prior C4-C7 ACDF. Solid vertebral body ankylosis at C4-C5, C5-C6 and C6-C7. 3. Cervical spondylosis as described. Multilevel spinal canal and neural foraminal narrowing. At C3-C4, a central disc protrusion contributes to at least moderate spinal canal stenosis. 4. Dextrocurvature of the cervical spine. Electronically Signed   By: Jackey Loge D.O.   On: 06/08/2023 16:51   CT Head  Wo Contrast  Result Date: 06/08/2023 CLINICAL DATA:  Provided history: Head trauma, moderate/severe. Neck trauma, dangerous injury mechanism. Additional history provided: Fall from ladder onto asphalt, multiple forehead abrasions. EXAM: CT HEAD WITHOUT CONTRAST CT CERVICAL SPINE WITHOUT CONTRAST TECHNIQUE: Multidetector CT imaging of the head and cervical spine was performed following the standard protocol without intravenous contrast. Multiplanar CT image reconstructions of the cervical spine were also generated. RADIATION DOSE REDUCTION: This exam was performed according to the departmental dose-optimization program which includes automated exposure control, adjustment of the mA and/or kV according to patient size and/or use of iterative reconstruction technique.  COMPARISON:  Head CT 08/31/2022. Report from cervical spine CT 03/28/2021 (images unavailable). Thyroid ultrasound examinations 08/04/2021 and 09/10/2021. FINDINGS: CT HEAD FINDINGS Brain: No age advanced or lobar predominant parenchymal atrophy. Incidentally noted is known 9 mm right tectal plate lipoma. There is no acute intracranial hemorrhage. No demarcated cortical infarct. No extra-axial fluid collection. No evidence of an intracranial mass. No midline shift. Vascular: No hyperdense vessel.  Atherosclerotic calcifications. Skull: No calvarial fracture or aggressive osseous lesion. Sinuses/Orbits: No mass or acute finding within the imaged orbits. Mild mucosal thickening within the bilateral ethmoid and left sphenoid sinuses. CT CERVICAL SPINE FINDINGS Alignment: Dextrocurvature of the thoracic spine. No significant spondylolisthesis. Skull base and vertebrae: The basion-dental and atlanto-dental intervals are maintained.No evidence of acute fracture to the cervical spine. Prior C4-C7 ACDF. No evidence of acute hardware compromise. Solid vertebral body ankylosis at C4-C5, C5-C6 and C6-C7. Fairly bulky ventral osteophytes at C3-C4 and C7-T1. Soft tissues and spinal canal: No prevertebral fluid or swelling. No visible canal hematoma. Nodules within the bilateral thyroid lobes previously assessed by thyroid ultrasound on 08/04/2021, and previously biopsied on 09/10/2021. Please correlate with the pathology reports. Disc levels: Prior C4-C7 ACDF as described above. Cervical spondylosis. Disc space narrowing at the non-operative levels, greatest at C7-T1 (moderate-to-advanced at this level). Central disc protrusions at C2-C3 and C3-C4. Disc bulge with endplate spurring at C7-T1. Multilevel uncovertebral hypertrophy. Multilevel spinal canal narrowing. Most notably at C3-C4, a central disc protrusion contributes to at least moderate spinal canal stenosis. Multilevel bony neural foraminal narrowing. Upper chest: No  consolidation within the imaged lung apices. No visible pneumothorax. IMPRESSION: CT head: No evidence of an acute intracranial abnormality. CT cervical spine: 1. No evidence of an acute cervical spine fracture. 2. Prior C4-C7 ACDF. Solid vertebral body ankylosis at C4-C5, C5-C6 and C6-C7. 3. Cervical spondylosis as described. Multilevel spinal canal and neural foraminal narrowing. At C3-C4, a central disc protrusion contributes to at least moderate spinal canal stenosis. 4. Dextrocurvature of the cervical spine. Electronically Signed   By: Jackey Loge D.O.   On: 06/08/2023 16:51   DG Chest 1 View  Result Date: 06/08/2023 CLINICAL DATA:  Fall from EXAM: CHEST  1 VIEW COMPARISON:  05/11/2022 FINDINGS: Cardiac and mediastinal contours are within normal limits given AP technique. No focal pulmonary opacity. No pleural effusion or pneumothorax. No displaced fracture on this single view. IMPRESSION: No acute cardiopulmonary process. Electronically Signed   By: Wiliam Ke M.D.   On: 06/08/2023 14:18    Procedures Procedures    Medications Ordered in ED Medications - No data to display  ED Course/ Medical Decision Making/ A&P                                 Medical Decision Making This patient presents to the ED for concern  of with head injury, this involves an extensive number of treatment options, and is a complaint that carries with it a high risk of complications and morbidity.  The differential diagnosis includes fracture, intracranial hemorrhage, concussion, other    Additional history obtained:  Additional history obtained from EMR External records from outside source obtained and reviewed including notes    Imaging Studies ordered:  I ordered imaging studies including chest x-ray which shows no pneumothorax, no infiltrates, no pulmonary edema, no displaced rib fractures I independently visualized and interpreted imaging within scope of identifying emergent findings  I agree with  the radiologist interpretation     Problem List / ED Course / Critical interventions / Medication management  Abrasion to forehead-patient had significant fall reported he was between 15 to 20 feet on a ladder, describing a free fall but has no pain, no loss of consciousness he is on blood thinners she has got reassuring vitals extremely reassuring exam is only visible injury or some very superficial abrasions to his forehead.  There is no bleeding.  Given the mechanism ordered CT head C-spine and chest x-ray which were all reassuring.  Vies on follow-up and return precautions.  Patient's having no pain, did not need any medications in the ED.  I visualized his chest abdomen and entire back and there is no bruising tenderness or crepitus.  He is not intoxicated.  GCS is 15.  I have reviewed the patients home medicines and have made adjustments as needed       Amount and/or Complexity of Data Reviewed Radiology: ordered.          Final Clinical Impression(s) / ED Diagnoses Final diagnoses:  Injury of head, initial encounter    Rx / DC Orders ED Discharge Orders     None         Josem Kaufmann 06/08/23 2235    Durwin Glaze, MD 06/09/23 1149

## 2023-06-08 NOTE — ED Notes (Signed)
X-ray at bedside

## 2023-06-08 NOTE — Discharge Instructions (Addendum)
It was a pleasure taking care of you today.  You were seen for falling from a ladder and hitting your head.  Your imaging was all reassuring they do follow degenerative changes in your neck.  You can take over-the-counter medications as needed at home for discomfort if needed.  Can back to the ER for any new or worrisome changes.

## 2023-06-08 NOTE — ED Triage Notes (Signed)
Pt states lost balance on a ladder this morning around 10am and fell approx 15 feet onto asphalt. Pt has multiple abrasions on forehead. Pt denies LOC. Denies Dizziness or lightheaded. No blood thinners. Denies any other injuries.

## 2023-10-24 ENCOUNTER — Inpatient Hospital Stay (HOSPITAL_COMMUNITY)
Admission: EM | Admit: 2023-10-24 | Discharge: 2023-10-26 | DRG: 638 | Disposition: A | Discharge status: Home Health | Payer: Medicare Other | Attending: Family Medicine | Admitting: Family Medicine

## 2023-10-24 ENCOUNTER — Other Ambulatory Visit: Payer: Self-pay

## 2023-10-24 ENCOUNTER — Emergency Department (HOSPITAL_COMMUNITY): Payer: Medicare Other

## 2023-10-24 ENCOUNTER — Observation Stay (HOSPITAL_COMMUNITY): Payer: Medicare Other

## 2023-10-24 ENCOUNTER — Encounter (HOSPITAL_COMMUNITY): Payer: Self-pay

## 2023-10-24 DIAGNOSIS — L03031 Cellulitis of right toe: Secondary | ICD-10-CM

## 2023-10-24 DIAGNOSIS — I10 Essential (primary) hypertension: Secondary | ICD-10-CM | POA: Diagnosis present

## 2023-10-24 DIAGNOSIS — Z89422 Acquired absence of other left toe(s): Secondary | ICD-10-CM

## 2023-10-24 DIAGNOSIS — E1169 Type 2 diabetes mellitus with other specified complication: Secondary | ICD-10-CM | POA: Diagnosis not present

## 2023-10-24 DIAGNOSIS — G1221 Amyotrophic lateral sclerosis: Secondary | ICD-10-CM | POA: Diagnosis present

## 2023-10-24 DIAGNOSIS — Z794 Long term (current) use of insulin: Secondary | ICD-10-CM

## 2023-10-24 DIAGNOSIS — E785 Hyperlipidemia, unspecified: Secondary | ICD-10-CM | POA: Diagnosis present

## 2023-10-24 DIAGNOSIS — B951 Streptococcus, group B, as the cause of diseases classified elsewhere: Secondary | ICD-10-CM | POA: Diagnosis present

## 2023-10-24 DIAGNOSIS — L03032 Cellulitis of left toe: Principal | ICD-10-CM | POA: Diagnosis present

## 2023-10-24 DIAGNOSIS — Z8249 Family history of ischemic heart disease and other diseases of the circulatory system: Secondary | ICD-10-CM

## 2023-10-24 DIAGNOSIS — G2581 Restless legs syndrome: Secondary | ICD-10-CM | POA: Diagnosis present

## 2023-10-24 DIAGNOSIS — M869 Osteomyelitis, unspecified: Secondary | ICD-10-CM | POA: Diagnosis present

## 2023-10-24 DIAGNOSIS — F429 Obsessive-compulsive disorder, unspecified: Secondary | ICD-10-CM | POA: Diagnosis present

## 2023-10-24 DIAGNOSIS — L02612 Cutaneous abscess of left foot: Secondary | ICD-10-CM | POA: Diagnosis present

## 2023-10-24 DIAGNOSIS — L0390 Cellulitis, unspecified: Secondary | ICD-10-CM | POA: Diagnosis present

## 2023-10-24 DIAGNOSIS — E118 Type 2 diabetes mellitus with unspecified complications: Secondary | ICD-10-CM

## 2023-10-24 DIAGNOSIS — R739 Hyperglycemia, unspecified: Secondary | ICD-10-CM | POA: Diagnosis present

## 2023-10-24 DIAGNOSIS — E1140 Type 2 diabetes mellitus with diabetic neuropathy, unspecified: Secondary | ICD-10-CM | POA: Diagnosis present

## 2023-10-24 DIAGNOSIS — Z888 Allergy status to other drugs, medicaments and biological substances status: Secondary | ICD-10-CM

## 2023-10-24 DIAGNOSIS — Z833 Family history of diabetes mellitus: Secondary | ICD-10-CM

## 2023-10-24 DIAGNOSIS — L089 Local infection of the skin and subcutaneous tissue, unspecified: Secondary | ICD-10-CM | POA: Diagnosis present

## 2023-10-24 DIAGNOSIS — E1165 Type 2 diabetes mellitus with hyperglycemia: Secondary | ICD-10-CM | POA: Diagnosis present

## 2023-10-24 LAB — CBC WITH DIFFERENTIAL/PLATELET
Abs Immature Granulocytes: 0.07 10*3/uL (ref 0.00–0.07)
Basophils Absolute: 0.1 10*3/uL (ref 0.0–0.1)
Basophils Relative: 1 %
Eosinophils Absolute: 0.1 10*3/uL (ref 0.0–0.5)
Eosinophils Relative: 1 %
HCT: 44.1 % (ref 39.0–52.0)
Hemoglobin: 15.9 g/dL (ref 13.0–17.0)
Immature Granulocytes: 1 %
Lymphocytes Relative: 22 %
Lymphs Abs: 2.3 10*3/uL (ref 0.7–4.0)
MCH: 29.6 pg (ref 26.0–34.0)
MCHC: 36.1 g/dL — ABNORMAL HIGH (ref 30.0–36.0)
MCV: 82 fL (ref 80.0–100.0)
Monocytes Absolute: 0.7 10*3/uL (ref 0.1–1.0)
Monocytes Relative: 7 %
Neutro Abs: 7.1 10*3/uL (ref 1.7–7.7)
Neutrophils Relative %: 68 %
Platelets: 172 10*3/uL (ref 150–400)
RBC: 5.38 MIL/uL (ref 4.22–5.81)
RDW: 12.3 % (ref 11.5–15.5)
WBC: 10.3 10*3/uL (ref 4.0–10.5)
nRBC: 0 % (ref 0.0–0.2)

## 2023-10-24 LAB — COMPREHENSIVE METABOLIC PANEL
ALT: 17 U/L (ref 0–44)
AST: 12 U/L — ABNORMAL LOW (ref 15–41)
Albumin: 4.2 g/dL (ref 3.5–5.0)
Alkaline Phosphatase: 114 U/L (ref 38–126)
Anion gap: 10 (ref 5–15)
BUN: 14 mg/dL (ref 6–20)
CO2: 23 mmol/L (ref 22–32)
Calcium: 9.4 mg/dL (ref 8.9–10.3)
Chloride: 98 mmol/L (ref 98–111)
Creatinine, Ser: 0.69 mg/dL (ref 0.61–1.24)
GFR, Estimated: >60 mL/min (ref >60)
Glucose, Bld: 410 mg/dL — ABNORMAL HIGH (ref 70–99)
Potassium: 3.5 mmol/L (ref 3.5–5.1)
Sodium: 131 mmol/L — ABNORMAL LOW (ref 135–145)
Total Bilirubin: 0.8 mg/dL (ref 0.0–1.2)
Total Protein: 8 g/dL (ref 6.5–8.1)

## 2023-10-24 LAB — HEMOGLOBIN A1C
Hgb A1c MFr Bld: 11.9 % — ABNORMAL HIGH (ref 4.8–5.6)
Mean Plasma Glucose: 294.83 mg/dL

## 2023-10-24 LAB — LACTIC ACID, PLASMA: Lactic Acid, Venous: 1.7 mmol/L (ref 0.5–1.9)

## 2023-10-24 LAB — GLUCOSE, CAPILLARY
Glucose-Capillary: 339 mg/dL — ABNORMAL HIGH (ref 70–99)
Glucose-Capillary: 197 mg/dL — ABNORMAL HIGH (ref 70–99)
Glucose-Capillary: 192 mg/dL — ABNORMAL HIGH (ref 70–99)

## 2023-10-24 MED ORDER — INSULIN GLARGINE-YFGN 100 UNIT/ML ~~LOC~~ SOLN
15.0000 [IU] | Freq: Once | SUBCUTANEOUS | Status: AC
  Administered 2023-10-24: 15 [IU] via SUBCUTANEOUS
  Filled 2023-10-24: qty 0.15

## 2023-10-24 MED ORDER — HYDRALAZINE HCL 20 MG/ML IJ SOLN: 10.0000 mg | INTRAMUSCULAR | Status: DC | PRN

## 2023-10-24 MED ORDER — SODIUM CHLORIDE 0.9 % IV BOLUS
1000.0000 mL | Freq: Once | INTRAVENOUS | Status: AC
  Administered 2023-10-24: 1000 mL via INTRAVENOUS

## 2023-10-24 MED ORDER — INSULIN ASPART 100 UNIT/ML IJ SOLN: 20.0000 [IU] | Freq: Once | INTRAMUSCULAR | Status: DC

## 2023-10-24 MED ORDER — ONDANSETRON HCL 4 MG PO TABS: 4.0000 mg | ORAL_TABLET | Freq: Four times a day (QID) | ORAL | Status: DC | PRN

## 2023-10-24 MED ORDER — ONDANSETRON HCL 4 MG/2ML IJ SOLN: 4.0000 mg | Freq: Four times a day (QID) | INTRAMUSCULAR | Status: DC | PRN

## 2023-10-24 MED ORDER — ACETAMINOPHEN 650 MG RE SUPP: 650.0000 mg | Freq: Four times a day (QID) | RECTAL | Status: DC | PRN

## 2023-10-24 MED ORDER — GADOBUTROL 1 MMOL/ML IV SOLN
9.5000 mL | Freq: Once | INTRAVENOUS | Status: AC | PRN
  Administered 2023-10-24: 9.5 mL via INTRAVENOUS

## 2023-10-24 MED ORDER — LORAZEPAM 2 MG/ML IJ SOLN
0.5000 mg | Freq: Once | INTRAMUSCULAR | Status: DC
  Filled 2023-10-24: qty 1

## 2023-10-24 MED ORDER — VANCOMYCIN HCL IN DEXTROSE 1-5 GM/200ML-% IV SOLN: 1000.0000 mg | Freq: Once | INTRAVENOUS | Status: DC

## 2023-10-24 MED ORDER — INSULIN ASPART 100 UNIT/ML IJ SOLN
0.0000 [IU] | Freq: Three times a day (TID) | INTRAMUSCULAR | Status: DC
  Administered 2023-10-24: 11 [IU] via SUBCUTANEOUS
  Administered 2023-10-24: 3 [IU] via SUBCUTANEOUS
  Administered 2023-10-25: 5 [IU] via SUBCUTANEOUS
  Administered 2023-10-25: 8 [IU] via SUBCUTANEOUS
  Administered 2023-10-25: 3 [IU] via SUBCUTANEOUS
  Administered 2023-10-26 (×2): 5 [IU] via SUBCUTANEOUS

## 2023-10-24 MED ORDER — MELATONIN 3 MG PO TABS
6.0000 mg | ORAL_TABLET | Freq: Once | ORAL | Status: AC
  Administered 2023-10-24: 6 mg via ORAL
  Filled 2023-10-24: qty 2

## 2023-10-24 MED ORDER — ORAL CARE MOUTH RINSE: 15.0000 mL | OROMUCOSAL | Status: DC | PRN

## 2023-10-24 MED ORDER — INSULIN ASPART 100 UNIT/ML IJ SOLN
4.0000 [IU] | Freq: Three times a day (TID) | INTRAMUSCULAR | Status: DC
  Administered 2023-10-24 – 2023-10-26 (×7): 4 [IU] via SUBCUTANEOUS

## 2023-10-24 MED ORDER — VANCOMYCIN HCL 1750 MG/350ML IV SOLN
1750.0000 mg | Freq: Two times a day (BID) | INTRAVENOUS | Status: DC
  Administered 2023-10-24 – 2023-10-26 (×4): 1750 mg via INTRAVENOUS
  Filled 2023-10-24 (×4): qty 350

## 2023-10-24 MED ORDER — VANCOMYCIN HCL 2000 MG/400ML IV SOLN
2000.0000 mg | Freq: Once | INTRAVENOUS | Status: AC
  Administered 2023-10-24: 2000 mg via INTRAVENOUS
  Filled 2023-10-24 (×2): qty 400

## 2023-10-24 MED ORDER — ENOXAPARIN SODIUM 40 MG/0.4ML IJ SOSY
40.0000 mg | PREFILLED_SYRINGE | INTRAMUSCULAR | Status: DC
  Administered 2023-10-24 – 2023-10-25 (×2): 40 mg via SUBCUTANEOUS
  Filled 2023-10-24 (×2): qty 0.4

## 2023-10-24 MED ORDER — INSULIN ASPART 100 UNIT/ML IJ SOLN
0.0000 [IU] | Freq: Every day | INTRAMUSCULAR | Status: DC
  Administered 2023-10-25: 2 [IU] via SUBCUTANEOUS

## 2023-10-24 MED ORDER — HYDROXYZINE HCL 10 MG PO TABS
10.0000 mg | ORAL_TABLET | Freq: Once | ORAL | Status: AC | PRN
  Administered 2023-10-24: 10 mg via ORAL
  Filled 2023-10-24: qty 1

## 2023-10-24 MED ORDER — ACETAMINOPHEN 325 MG PO TABS
650.0000 mg | ORAL_TABLET | Freq: Four times a day (QID) | ORAL | Status: DC | PRN
Start: 2023-10-24 — End: 2023-10-26

## 2023-10-24 NOTE — H&P (Signed)
History and Physical    Albert Mcdonald JYN:[id number] DOB: May 05, 1971 DOA: 10/24/2023  PCP: Patient, No Pcp Per   Patient coming from: Home  Chief Complaint: Foot pain/groin pain  HPI: Albert Mcdonald is a 53 y.o. male with medical history significant for poorly controlled type 2 diabetes, osteomyelitis with prior toe amputations, dyslipidemia, and hypertension who presented to the ED with complaints of left great toe wound/infection.  He noticed signs of erythema and swelling over the last 2 days, but denies any drainage.  He denies any fevers or chills, but states that he has some knots on his left groin that have also appeared in the same timeframe.  He denies any significant injury to his toe.  He does not monitor his blood glucose closely at home.   ED Course: Vital signs stable and patient afebrile.  Blood glucose 410 and sodium 131.  Left foot x-ray with no signs of osteomyelitis to the left great toe.  Given IV fluid and started on vancomycin.  General surgery recommending MRI and further consultation if needed.  Review of Systems: Reviewed as noted above, otherwise negative.  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 ALS   Diabetes mellitus    Family history of anesthesia complication    Hyperlipidemia    Hypertension    Kleine-Levin syndrome    OCD (obsessive compulsive disorder)    Restless leg     Past Surgical History:  Procedure Laterality Date   AMPUTATION Right 02/26/2022   Procedure: AMPUTATION 5th TOE;  Surgeon: Nadara Mustard, MD;  Location[facility] OR;  Service: Orthopedics;  Laterality: Right;   AMPUTATION TOE Left 07/30/2022   Procedure: AMPUTATION TOE, left third toe;  Surgeon: Lucretia Roers, MD;  Location: [facility] ORS;  Service: General;  Laterality: Left;   AMPUTATION TOE Left 11/01/2022   Procedure: AMPUTATION TOE, LEFT SECOND;  Surgeon: Lucretia Roers, MD;  Location: [facility]  ORS;  Service: General;  Laterality: Left;   ANTERIOR CERVICAL DECOMP/DISCECTOMY FUSION  11/09/2012   Procedure: ANTERIOR CERVICAL DECOMPRESSION/DISCECTOMY FUSION 3 LEVELS C4-C7 ;  Surgeon: Venita Lick, MD;  Location: [facility] OR;  Service: Orthopedics;;  ACDF C4-C7      reports that he has never smoked. He has never used smokeless tobacco. He reports that he does not currently use alcohol. He reports that he does not use drugs.  Allergies  Allergen Reactions   Ace Inhibitors Rash    Tolerates lisinopril    Family History  Problem Relation Age of Onset   Diabetes Mother    Cancer - Colon Neg Hx    Colon polyps Neg Hx     Prior to Admission medications   Medication Sig Start Date End Date Taking? Authorizing Provider  acetaminophen (TYLENOL) 500 MG tablet Take 1,000 mg by mouth every 6 (six) hours as needed for moderate pain (pain score 4-6).   Yes [provider]  Insulin Glargine (BASAGLAR KWIKPEN) 100 UNIT/ML Inject 28-30 Units into the skin as needed (low blood sugar).   Yes [provider]  blood glucose meter kit and supplies Dispense based on patient and insurance preference. Use up to four times daily as directed. (FOR ICD-10 E10.9, E11.9). 02/27/22   Burnadette Pop, MD  insulin glargine (LANTUS SOLOSTAR) 100 UNIT/ML Solostar Pen Inject 15 Units into the skin at bedtime. Patient not taking: Reported on 10/24/2023 12/01/22   Ronny Bacon  J, NP  Insulin Pen Needle 32G X 4 MM MISC 1 each by Does not apply route 3 (three) times daily. 02/27/22   Burnadette Pop, MD    Physical Exam: Vitals:   10/24/23 0838 10/24/23 0847 10/24/23 1200 10/24/23 1220  BP:  131/80  (!) 148/104  Pulse:  (!) 102 91 88  Resp:  17    Temp:  98.2 F (36.8 C)  98.1 F (36.7 C)  TempSrc:  Oral  Oral  SpO2:  96% 95% 95%  Weight: 97.5 kg     Height: 6\' 1"  (1.854 m)       Constitutional: NAD, calm, comfortable Vitals:   10/24/23 0838 10/24/23 0847 10/24/23 1200 10/24/23 1220  BP:   131/80  (!) 148/104  Pulse:  (!) 102 91 88  Resp:  17    Temp:  98.2 F (36.8 C)  98.1 F (36.7 C)  TempSrc:  Oral  Oral  SpO2:  96% 95% 95%  Weight: 97.5 kg     Height: 6\' 1"  (1.854 m)      Eyes: lids and conjunctivae normal Neck: normal, supple Respiratory: clear to auscultation bilaterally. Normal respiratory effort. No accessory muscle use.  Cardiovascular: Regular rate and rhythm, no murmurs. Abdomen: no tenderness, no distention. Bowel sounds positive.  Musculoskeletal:  No edema. Skin:    Psychiatric: Flat affect  Labs on Admission: I have personally reviewed following labs and imaging studies  CBC: Recent Labs  Lab 10/24/23 0931  WBC 10.3  NEUTROABS 7.1  HGB 15.9  HCT 44.1  MCV 82.0  PLT 172   Basic Metabolic Panel: Recent Labs  Lab 10/24/23 0931  NA 131*  K 3.5  CL 98  CO2 23  GLUCOSE 410*  BUN 14  CREATININE 0.69  CALCIUM 9.4   GFR: Estimated Creatinine Clearance: 132.8 mL/min (by C-G formula based on SCr of 0.69 mg/dL). Liver Function Tests: Recent Labs  Lab 10/24/23 0931  AST 12*  ALT 17  ALKPHOS 114  BILITOT 0.8  PROT 8.0  ALBUMIN 4.2   No results for input(s): "LIPASE", "AMYLASE" in the last 168 hours. No results for input(s): "AMMONIA" in the last 168 hours. Coagulation Profile: No results for input(s): "INR", "PROTIME" in the last 168 hours. Cardiac Enzymes: No results for input(s): "CKTOTAL", "CKMB", "CKMBINDEX", "TROPONINI" in the last 168 hours. BNP (last 3 results) No results for input(s): "PROBNP" in the last 8760 hours. HbA1C: No results for input(s): "HGBA1C" in the last 72 hours. CBG: Recent Labs  Lab 10/24/23 1222  GLUCAP 339*   Lipid Profile: No results for input(s): "CHOL", "HDL", "LDLCALC", "TRIG", "CHOLHDL", "LDLDIRECT" in the last 72 hours. Thyroid Function Tests: No results for input(s): "TSH", "T4TOTAL", "FREET4", "T3FREE", "THYROIDAB" in the last 72 hours. Anemia Panel: No results for input(s):  "VITAMINB12", "FOLATE", "FERRITIN", "TIBC", "IRON", "RETICCTPCT" in the last 72 hours. Urine analysis:    Component Value Date/Time   COLORURINE YELLOW 03/14/2017 2022   APPEARANCEUR CLEAR 03/14/2017 2022   LABSPEC 1.027 03/14/2017 2022   PHURINE 6.0 03/14/2017 2022   GLUCOSEU 50 (A) 03/14/2017 2022   HGBUR NEGATIVE 03/14/2017 2022   BILIRUBINUR NEGATIVE 03/14/2017 2022   KETONESUR 20 (A) 03/14/2017 2022   PROTEINUR 30 (A) 03/14/2017 2022   UROBILINOGEN 1.0 10/28/2009 0030   NITRITE NEGATIVE 03/14/2017 2022   LEUKOCYTESUR NEGATIVE 03/14/2017 2022    Radiological Exams on Admission: DG Toe Great Left Result Date: 10/24/2023 CLINICAL DATA:  wound. EXAM: LEFT GREAT TOE COMPARISON:  10/28/2022. FINDINGS:  No acute fracture or dislocation. No aggressive osseous lesion. Note is made of amputation of second and third toes at the level of metatarsophalangeal joint. Ankle mortise appears intact. There is focal soft tissue swelling around the distal phalanx of the great toe. There are lucent areas suggesting overlying soft tissue defect/air within the soft tissue. No erosions seen in the underlying bone. No radiopaque foreign bodies. IMPRESSION: *No radiographic evidence of acute osteomyelitis. There is focal soft tissue swelling/defect and air within the soft tissue surrounding the distal phalanx of the great toe. Correlate with physical examination. Electronically Signed   By: Jules Schick M.D.   On: 10/24/2023 09:42     Assessment/Plan Principal Problem:   Cellulitis Active Problems:   HLD (hyperlipidemia)   Essential hypertension   Diabetic neuropathy (HCC)   Hyperglycemia    Left great toe cellulitis with prior toe amputations -X-ray without evidence for osteomyelitis -MRI ordered and pending -Consult to general surgery pending these results -ABI performed in 2023 with no issues with vascularity -Could consider repeat if needed  Type 2 diabetes poorly controlled with  hyperglycemia  Pseudohyponatremia secondary to above -Continue to monitor and work on euglycemia  Hypertension -Continue amlodipine  Dyslipidemia -Continue statin  DVT prophylaxis: Lovenox Code Status: Full Family Communication: None at bedside Disposition Plan: Admit for treatment of cellulitis Consults called: None Admission status: Observation, MedSurg  Severity of Illness: The appropriate patient status for this patient is OBSERVATION. Observation status is judged to be reasonable and necessary in order to provide the required intensity of service to ensure the patient's safety. The patient's presenting symptoms, physical exam findings, and initial radiographic and laboratory data in the context of their medical condition is felt to place them at decreased risk for further clinical deterioration. Furthermore, it is anticipated that the patient will be medically stable for discharge from the hospital within 2 midnights of admission.    Yotam Rhine D Sherryll Burger DO [facility]  If 7PM-7AM, please contact night-coverage [email or web address]  10/24/2023, 1:35 PM

## 2023-10-24 NOTE — Plan of Care (Signed)
  Problem: Nutrition: Goal: Adequate nutrition will be maintained Outcome: Progressing   Problem: Coping: Goal: Level of anxiety will decrease Outcome: Progressing   Problem: Elimination: Goal: Will not experience complications related to urinary retention Outcome: Progressing   Problem: Pain Managment: Goal: General experience of comfort will improve and/or be controlled Outcome: Progressing   Problem: Safety: Goal: Ability to remain free from injury will improve Outcome: Progressing   Problem: Skin Integrity: Goal: Risk for impaired skin integrity will decrease Outcome: Progressing   Problem: Coping: Goal: Ability to adjust to condition or change in health will improve Outcome: Progressing

## 2023-10-24 NOTE — ED Provider Notes (Signed)
[facility]MENT AT [facility] Provider Note   CSN: [id number] Arrival date & time: 10/24/23  0272     History  Chief Complaint  Patient presents with   Foot Pain   Groin Pain    Albert Mcdonald is a 53 y.o. male.  Patient with history of diabetes, osteomyelitis status post several toe amputations previously presents today with complaints of left great toe wound.  States that same began 2 days ago and has been persistent since then.  States he has noticed a wound on the bottom of his toe and redness and swelling on his toe as well and is concern for infection.  Denies fevers but does state he has had chills.  States he is feeling pain in his left groin as well.   The history is provided by the patient. No language interpreter was used.  Foot Pain  Groin Pain       Home Medications Prior to Admission medications   Medication Sig Start Date End Date Taking? Authorizing Provider  acetaminophen (TYLENOL) 500 MG tablet Take 1,000 mg by mouth every 6 (six) hours as needed for moderate pain (pain score 4-6).   Yes [provider]  Insulin Glargine (BASAGLAR KWIKPEN) 100 UNIT/ML Inject 28-30 Units into the skin as needed (low blood sugar).   Yes [provider]  blood glucose meter kit and supplies Dispense based on patient and insurance preference. Use up to four times daily as directed. (FOR ICD-10 E10.9, E11.9). 02/27/22   Burnadette Pop, MD  insulin glargine (LANTUS SOLOSTAR) 100 UNIT/ML Solostar Pen Inject 15 Units into the skin at bedtime. Patient not taking: Reported on 10/24/2023 12/01/22   Dani Gobble, NP  Insulin Pen Needle 32G X 4 MM MISC 1 each by Does not apply route 3 (three) times daily. 02/27/22   Burnadette Pop, MD      Allergies    Ace inhibitors    Review of Systems   Review of Systems  Skin:  Positive for wound.  All other systems reviewed and are negative.   Physical Exam Updated Vital Signs BP 131/80 (BP  Location: Right Arm)   Pulse (!) 102   Temp 98.2 F (36.8 C) (Oral)   Resp 17   Ht 6\' 1"  (1.854 m)   Wt 97.5 kg   SpO2 96%   BMI 28.37 kg/m  Physical Exam Vitals and nursing note reviewed.  Constitutional:      General: He is not in acute distress.    Appearance: Normal appearance. He is normal weight. He is not ill-appearing, toxic-appearing or diaphoretic.  HENT:     Head: Normocephalic and atraumatic.  Cardiovascular:     Rate and Rhythm: Normal rate.  Pulmonary:     Effort: Pulmonary effort is normal. No respiratory distress.  Abdominal:     Comments: Left groin with swollen tender lymph node, likely reactive to cellulitic changes of the toe  Musculoskeletal:        General: Normal range of motion.     Cervical back: Normal range of motion.  Skin:    General: Skin is warm and dry.     Comments: Wound noted to the bottom of the left great toe draining trace amounts of serous appearing fluid. No fluctuance or induration. Erythema noted throughout the toe. The tip of the toe is covered in callused skin, however does appear to potentially have darker underlying skin concerning for necrotic tissue. See images below for further  Neurological:     General: No focal deficit present.     Mental Status: He is alert.  Psychiatric:        Mood and Affect: Mood normal.        Behavior: Behavior normal.          ED Results / Procedures / Treatments   Labs (all labs ordered are listed, but only abnormal results are displayed) Labs Reviewed  COMPREHENSIVE METABOLIC PANEL - Abnormal; Notable for the following components:      Result Value   Sodium 131 (*)    Glucose, Bld 410 (*)    AST 12 (*)    All other components within normal limits  CBC WITH DIFFERENTIAL/PLATELET - Abnormal; Notable for the following components:   MCHC 36.1 (*)    All other components within normal limits  CULTURE, BLOOD (ROUTINE X 2)  CULTURE, BLOOD (ROUTINE X 2)  LACTIC ACID, PLASMA  HEMOGLOBIN  A1C    EKG None  Radiology DG Toe Great Left Result Date: 10/24/2023 CLINICAL DATA:  wound. EXAM: LEFT GREAT TOE COMPARISON:  10/28/2022. FINDINGS: No acute fracture or dislocation. No aggressive osseous lesion. Note is made of amputation of second and third toes at the level of metatarsophalangeal joint. Ankle mortise appears intact. There is focal soft tissue swelling around the distal phalanx of the great toe. There are lucent areas suggesting overlying soft tissue defect/air within the soft tissue. No erosions seen in the underlying bone. No radiopaque foreign bodies. IMPRESSION: *No radiographic evidence of acute osteomyelitis. There is focal soft tissue swelling/defect and air within the soft tissue surrounding the distal phalanx of the great toe. Correlate with physical examination. Electronically Signed   By: Jules Schick M.D.   On: 10/24/2023 09:42    Procedures Procedures    Medications Ordered in ED Medications  vancomycin (VANCOREADY) IVPB 2000 mg/400 mL (has no administration in time range)  LORazepam (ATIVAN) injection 0.5 mg (has no administration in time range)  sodium chloride 0.9 % bolus 1,000 mL (has no administration in time range)  insulin aspart (novoLOG) injection 0-15 Units (has no administration in time range)  insulin aspart (novoLOG) injection 0-5 Units (has no administration in time range)  insulin aspart (novoLOG) injection 4 Units (has no administration in time range)  insulin glargine-yfgn (SEMGLEE) injection 15 Units (has no administration in time range)  insulin aspart (novoLOG) injection 20 Units (has no administration in time range)    ED Course/ Medical Decision Making/ A&P                                 Medical Decision Making Amount and/or Complexity of Data Reviewed Labs: ordered. Radiology: ordered.  Risk Prescription drug management. Decision regarding hospitalization.   This patient is a 53 y.o. male who presents to the ED for  concern of left great toe wound, this involves an extensive number of treatment options, and is a complaint that carries with it a high risk of complications and morbidity. The emergent differential diagnosis prior to evaluation includes, but is not limited to,  sepsis, cellulitis, osteomyelitis, abscess . This is not an exhaustive differential.   Past Medical History / Co-morbidities / Social History:  has a past medical history of ALS (amyotrophic lateral sclerosis) (HCC), Diabetes mellitus, Family history of anesthesia complication, Hyperlipidemia, Hypertension, Kleine-Levin syndrome, OCD (obsessive compulsive disorder), and Restless leg.  Additional history: Chart reviewed. Pertinent results include: patient  with previous left 2nd and third toe amputations with general surgery Dr. Henreitta Leber. Most recent hgb A1c was 10  Physical Exam: Physical exam performed. The pertinent findings include: see images above  Lab Tests: I ordered, and personally interpreted labs.  The pertinent results include:  glucose 410, Na 131, bicarb 23, anion gap WNL.  No leukocytosis or lactic acidosis   Imaging Studies: I ordered imaging studies including Dg left great toe. I independently visualized and interpreted imaging which showed   No radiographic evidence of acute osteomyelitis. There is focal soft tissue swelling/defect and air within the soft tissue surrounding the distal phalanx of the great toe. Correlate with physical examination.  I agree with the radiologist interpretation.   Medications: I ordered medication including fluids, vancomycin  for cellulitis, hyperglycemia. Reevaluation of the patient after these medicines showed that the patient improved. I have reviewed the patients home medicines and have made adjustments as needed.  Consultations Obtained: I requested consultation with the general surgery on call Dr. Robyne Peers,  and discussed lab and imaging findings as well as pertinent plan - they  recommend: admit to medicine, obtain MRI to further evaluate, reconsult surgery if indicated   Disposition: After consideration of the diagnostic results and the patients response to treatment, I feel that patient will require admission for concern for developing osteomyelitis of the left great toe. Patient is understanding and in agreement with this.   Dicussed patient with hospitalist Dr. Sherryll Burger who accepts patient for admission.   I discussed this case with my attending physician Dr. Rubin Payor who cosigned this note including patient's presenting symptoms, physical exam, and planned diagnostics and interventions. Attending physician stated agreement with plan or made changes to plan which were implemented.    Final Clinical Impression(s) / ED Diagnoses Final diagnoses:  Cellulitis of toe, left    Rx / DC Orders ED Discharge Orders     None         Vear Clock 10/24/23 1136    Benjiman Core, MD 10/24/23 1517

## 2023-10-24 NOTE — ED Triage Notes (Addendum)
Patient coming from home has pain in his foot,specifically his left big toe for 2 days and has done some activity and resting it, but has continued to increase. There is absence of feeling in his toe and discharge from a sore on the bottom of the toe. Patient also feels pain in his groin and concerned about having an infection.

## 2023-10-24 NOTE — Consult Note (Signed)
Pharmacy Antibiotic Note  ASSESSMENT: 53 y.o. male with PMH including uncontrolled T2DM, osteomyelitis, and prior toe amputations is presenting with left great toe wound/infection  Denies purulence. X-ray of toe does not show osteomyelitis but does show soft tissue welling/defect and air in the area. He is VSS without leukocytosis. Pharmacy has been consulted to manage vancomycin dosing.  Patient measurements: Height: 6\' 1"  (185.4 cm) Weight: 97.5 kg (215 lb) IBW/kg (Calculated) : 79.9  Vital signs: Temp: 98.1 F (36.7 C) (01/27 1220) Temp Source: Oral (01/27 1220) BP: 148/104 (01/27 1220) Pulse Rate: 88 (01/27 1220) Recent Labs  Lab 10/24/23 0931  WBC 10.3  CREATININE 0.69   Estimated Creatinine Clearance: 132.8 mL/min (by C-G formula based on SCr of 0.69 mg/dL).  Allergies: Allergies  Allergen Reactions   Ace Inhibitors Rash    Tolerates lisinopril    Antimicrobials this admission: Vancomycin 1/27 >>  Dose adjustments this admission: N/A  Microbiology results: 1/27 BCx: NGTD  PLAN: Administer vancomycin 2000 mg IV x 1 as a loading dose followed by vancomycin 1750 mg IV q12H thereafter eAUC 472, Cmax 35, Cmin 12 Scr 0.8, IBW, Vd 0.72 L/kg Follow up culture results to assess for antibiotic optimization. Monitor renal function to assess for any necessary antibiotic dosing changes.   Thank you for allowing pharmacy to be a part of this patient's care.  Will M. Dareen Piano, PharmD Clinical Pharmacist 10/24/2023 2:57 PM

## 2023-10-24 NOTE — ED Notes (Addendum)
ED TO INPATIENT HANDOFF REPORT  ED Nurse Name and Phone #: Beverley Fiedler Name/Age/Gender Albert Mcdonald 53 y.o. male Room/Bed: APA04/APA04  Code Status   Code Status: Prior  Home/SNF/Other Home Patient oriented to: self, place, time, and situation Is this baseline? Yes   Triage Complete: Triage complete  Chief Complaint Cellulitis [L03.90]  Triage Note Patient coming from home has pain in his foot,specifically his left big toe for 2 days and has done some activity and resting it, but has continued to increase. There is absence of feeling in his toe and discharge from a sore on the bottom of the toe. Patient also feels pain in his groin and concerned about having an infection.    Allergies Allergies  Allergen Reactions   Ace Inhibitors Rash    Tolerates lisinopril    Level of Care/Admitting Diagnosis ED Disposition     ED Disposition  Admit   Condition  --   Comment  Hospital [facility]: [facility] [[id number]]  Level of Care: Med-Surg [16]  Covid Evaluation: Asymptomatic - no recent exposure (last 10 days) testing not required  Diagnosis: Cellulitis [161096]  Admitting Physician: Erick Blinks [[id number]]  Attending Physician: Erick Blinks [[id number]]          B Medical/Surgery History 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 ALS   Diabetes mellitus    Family history of anesthesia complication    Hyperlipidemia    Hypertension    Kleine-Levin syndrome    OCD (obsessive compulsive disorder)    Restless leg    Past Surgical History:  Procedure Laterality Date   AMPUTATION Right 02/26/2022   Procedure: AMPUTATION 5th TOE;  Surgeon: Nadara Mustard, MD;  Location: [facility] OR;  Service: Orthopedics;  Laterality: Right;   AMPUTATION TOE Left 07/30/2022   Procedure: AMPUTATION TOE, left third toe;  Surgeon: Lucretia Roers, MD;  Location: [facility] ORS;  Service: General;   Laterality: Left;   AMPUTATION TOE Left 11/01/2022   Procedure: AMPUTATION TOE, LEFT SECOND;  Surgeon: Lucretia Roers, MD;  Location: [facility] ORS;  Service: General;  Laterality: Left;   ANTERIOR CERVICAL DECOMP/DISCECTOMY FUSION  11/09/2012   Procedure: ANTERIOR CERVICAL DECOMPRESSION/DISCECTOMY FUSION 3 LEVELS C4-C7 ;  Surgeon: Venita Lick, MD;  Location: [facility] OR;  Service: Orthopedics;;  ACDF C4-C7      A IV Location/Drains/Wounds Patient Lines/Drains/Airways Status     Active Line/Drains/Airways     Name Placement date Placement time Site Days   Peripheral IV 10/24/23 20 G 1" Anterior;Proximal;Right Forearm 10/24/23  1204  Forearm  less than 1            Intake/Output Last 24 hours  Intake/Output Summary (Last 24 hours) at 10/24/2023 1210 Last data filed at 10/24/2023 1209 Gross per 24 hour  Intake --  Output 800 ml  Net -800 ml    Labs/Imaging Results for orders placed or performed during the hospital encounter of 10/24/23 (from the past 48 hours)  Comprehensive metabolic panel     Status: Abnormal   Collection Time: 10/24/23  9:31 AM  Result Value Ref Range   Sodium 131 (L) 135 - 145 mmol/L   Potassium 3.5 3.5 - 5.1 mmol/L   Chloride 98 98 - 111 mmol/L   CO2 23 22 - 32 mmol/L   Glucose, Bld 410 (H) 70 - 99 mg/dL    Comment: Glucose  reference range applies only to samples taken after fasting for at least 8 hours.   BUN 14 6 - 20 mg/dL   Creatinine, Ser 0.98 0.61 - 1.24 mg/dL   Calcium 9.4 8.9 - 11.9 mg/dL   Total Protein 8.0 6.5 - 8.1 g/dL   Albumin 4.2 3.5 - 5.0 g/dL   AST 12 (L) 15 - 41 U/L   ALT 17 0 - 44 U/L   Alkaline Phosphatase 114 38 - 126 U/L   Total Bilirubin 0.8 0.0 - 1.2 mg/dL   GFR, Estimated >14 >78 mL/min    Comment: (NOTE) Calculated using the CKD-EPI Creatinine Equation (2021)    Anion gap 10 5 - 15    Comment: Performed at [facility], [address], [place], [place] [address]  CBC with Differential     Status: Abnormal   Collection Time:  10/24/23  9:31 AM  Result Value Ref Range   WBC 10.3 4.0 - 10.5 K/uL   RBC 5.38 4.22 - 5.81 MIL/uL   Hemoglobin 15.9 13.0 - 17.0 g/dL   HCT 13.0 86.5 - 78.4 %   MCV 82.0 80.0 - 100.0 fL   MCH 29.6 26.0 - 34.0 pg   MCHC 36.1 (H) 30.0 - 36.0 g/dL   RDW 69.6 29.5 - 28.4 %   Platelets 172 150 - 400 K/uL   nRBC 0.0 0.0 - 0.2 %   Neutrophils Relative % 68 %   Neutro Abs 7.1 1.7 - 7.7 K/uL   Lymphocytes Relative 22 %   Lymphs Abs 2.3 0.7 - 4.0 K/uL   Monocytes Relative 7 %   Monocytes Absolute 0.7 0.1 - 1.0 K/uL   Eosinophils Relative 1 %   Eosinophils Absolute 0.1 0.0 - 0.5 K/uL   Basophils Relative 1 %   Basophils Absolute 0.1 0.0 - 0.1 K/uL   Immature Granulocytes 1 %   Abs Immature Granulocytes 0.07 0.00 - 0.07 K/uL    Comment: Performed at [facility], [address], [place], [place] [address]  Lactic acid     Status: None   Collection Time: 10/24/23  9:41 AM  Result Value Ref Range   Lactic Acid, Venous 1.7 0.5 - 1.9 mmol/L    Comment: Performed at [facility], [address], [place], [place] [address]   DG Toe Great Left Result Date: 10/24/2023 CLINICAL DATA:  wound. EXAM: LEFT GREAT TOE COMPARISON:  10/28/2022. FINDINGS: No acute fracture or dislocation. No aggressive osseous lesion. Note is made of amputation of second and third toes at the level of metatarsophalangeal joint. Ankle mortise appears intact. There is focal soft tissue swelling around the distal phalanx of the great toe. There are lucent areas suggesting overlying soft tissue defect/air within the soft tissue. No erosions seen in the underlying bone. No radiopaque foreign bodies. IMPRESSION: *No radiographic evidence of acute osteomyelitis. There is focal soft tissue swelling/defect and air within the soft tissue surrounding the distal phalanx of the great toe. Correlate with physical examination. Electronically Signed   By: Jules Schick M.D.   On: 10/24/2023 09:42    Pending Labs Unresulted Labs (From  admission, onward)     Start     Ordered   10/24/23 1127  Hemoglobin A1c  Once,   R       Comments: To assess prior glycemic control    10/24/23 1127   10/24/23 0906  Blood Cultures x 2 sites  BLOOD CULTURE X 2,   STAT      10/24/23  1610            Vitals/Pain Today's Vitals   10/24/23 0837 10/24/23 0838 10/24/23 0847  BP:   131/80  Pulse:   (!) 102  Resp:   17  Temp:   98.2 F (36.8 C)  TempSrc:   Oral  SpO2:   96%  Weight:  97.5 kg   Height:  6\' 1"  (1.854 m)   PainSc: 8       Isolation Precautions No active isolations  Medications Medications  vancomycin (VANCOREADY) IVPB 2000 mg/400 mL (has no administration in time range)  LORazepam (ATIVAN) injection 0.5 mg (has no administration in time range)  sodium chloride 0.9 % bolus 1,000 mL (has no administration in time range)  insulin aspart (novoLOG) injection 0-15 Units (has no administration in time range)  insulin aspart (novoLOG) injection 0-5 Units (has no administration in time range)  insulin aspart (novoLOG) injection 4 Units (has no administration in time range)  insulin glargine-yfgn (SEMGLEE) injection 15 Units (has no administration in time range)  insulin aspart (novoLOG) injection 20 Units (has no administration in time range)    Mobility walks      R Recommendations: See Admitting Provider Note  Report given to: [facility]

## 2023-10-24 NOTE — Plan of Care (Signed)

## 2023-10-25 ENCOUNTER — Other Ambulatory Visit (HOSPITAL_COMMUNITY): Payer: Self-pay

## 2023-10-25 DIAGNOSIS — E1140 Type 2 diabetes mellitus with diabetic neuropathy, unspecified: Secondary | ICD-10-CM | POA: Diagnosis present

## 2023-10-25 DIAGNOSIS — T148XXA Other injury of unspecified body region, initial encounter: Secondary | ICD-10-CM | POA: Diagnosis not present

## 2023-10-25 DIAGNOSIS — M869 Osteomyelitis, unspecified: Secondary | ICD-10-CM | POA: Diagnosis present

## 2023-10-25 DIAGNOSIS — L02612 Cutaneous abscess of left foot: Secondary | ICD-10-CM | POA: Diagnosis present

## 2023-10-25 DIAGNOSIS — Z8249 Family history of ischemic heart disease and other diseases of the circulatory system: Secondary | ICD-10-CM | POA: Diagnosis not present

## 2023-10-25 DIAGNOSIS — E1169 Type 2 diabetes mellitus with other specified complication: Secondary | ICD-10-CM | POA: Diagnosis present

## 2023-10-25 DIAGNOSIS — Z888 Allergy status to other drugs, medicaments and biological substances status: Secondary | ICD-10-CM | POA: Diagnosis not present

## 2023-10-25 DIAGNOSIS — L03032 Cellulitis of left toe: Secondary | ICD-10-CM

## 2023-10-25 DIAGNOSIS — G2581 Restless legs syndrome: Secondary | ICD-10-CM | POA: Diagnosis present

## 2023-10-25 DIAGNOSIS — E1165 Type 2 diabetes mellitus with hyperglycemia: Secondary | ICD-10-CM | POA: Diagnosis present

## 2023-10-25 DIAGNOSIS — E785 Hyperlipidemia, unspecified: Secondary | ICD-10-CM | POA: Diagnosis present

## 2023-10-25 DIAGNOSIS — R739 Hyperglycemia, unspecified: Secondary | ICD-10-CM | POA: Diagnosis not present

## 2023-10-25 DIAGNOSIS — F429 Obsessive-compulsive disorder, unspecified: Secondary | ICD-10-CM | POA: Diagnosis present

## 2023-10-25 DIAGNOSIS — Z833 Family history of diabetes mellitus: Secondary | ICD-10-CM | POA: Diagnosis not present

## 2023-10-25 DIAGNOSIS — B951 Streptococcus, group B, as the cause of diseases classified elsewhere: Secondary | ICD-10-CM | POA: Diagnosis present

## 2023-10-25 DIAGNOSIS — Z89422 Acquired absence of other left toe(s): Secondary | ICD-10-CM | POA: Diagnosis not present

## 2023-10-25 DIAGNOSIS — Z794 Long term (current) use of insulin: Secondary | ICD-10-CM | POA: Diagnosis not present

## 2023-10-25 DIAGNOSIS — I10 Essential (primary) hypertension: Secondary | ICD-10-CM | POA: Diagnosis present

## 2023-10-25 DIAGNOSIS — G1221 Amyotrophic lateral sclerosis: Secondary | ICD-10-CM | POA: Diagnosis present

## 2023-10-25 DIAGNOSIS — L0390 Cellulitis, unspecified: Secondary | ICD-10-CM | POA: Diagnosis present

## 2023-10-25 DIAGNOSIS — L089 Local infection of the skin and subcutaneous tissue, unspecified: Secondary | ICD-10-CM | POA: Diagnosis present

## 2023-10-25 LAB — GLUCOSE, CAPILLARY
Glucose-Capillary: 195 mg/dL — ABNORMAL HIGH (ref 70–99)
Glucose-Capillary: 237 mg/dL — ABNORMAL HIGH (ref 70–99)
Glucose-Capillary: 300 mg/dL — ABNORMAL HIGH (ref 70–99)
Glucose-Capillary: 222 mg/dL — ABNORMAL HIGH (ref 70–99)

## 2023-10-25 LAB — SEDIMENTATION RATE: Sed Rate: 26 mm/h — ABNORMAL HIGH (ref 0–16)

## 2023-10-25 LAB — BASIC METABOLIC PANEL
Anion gap: 9 (ref 5–15)
BUN: 21 mg/dL — ABNORMAL HIGH (ref 6–20)
CO2: 24 mmol/L (ref 22–32)
Calcium: 9 mg/dL (ref 8.9–10.3)
Chloride: 104 mmol/L (ref 98–111)
Creatinine, Ser: 0.61 mg/dL (ref 0.61–1.24)
GFR, Estimated: >60 mL/min (ref >60)
Glucose, Bld: 182 mg/dL — ABNORMAL HIGH (ref 70–99)
Potassium: 3.3 mmol/L — ABNORMAL LOW (ref 3.5–5.1)
Sodium: 137 mmol/L (ref 135–145)

## 2023-10-25 LAB — CBC
HCT: 43.1 % (ref 39.0–52.0)
Hemoglobin: 15.1 g/dL (ref 13.0–17.0)
MCH: 29.1 pg (ref 26.0–34.0)
MCHC: 35 g/dL (ref 30.0–36.0)
MCV: 83 fL (ref 80.0–100.0)
Platelets: 155 10*3/uL (ref 150–400)
RBC: 5.19 MIL/uL (ref 4.22–5.81)
RDW: 12.3 % (ref 11.5–15.5)
WBC: 8.7 10*3/uL (ref 4.0–10.5)
nRBC: 0 % (ref 0.0–0.2)

## 2023-10-25 LAB — HIV ANTIBODY (ROUTINE TESTING W REFLEX): HIV Screen 4th Generation wRfx: NONREACTIVE

## 2023-10-25 LAB — C-REACTIVE PROTEIN: CRP: 7.2 mg/dL — ABNORMAL HIGH (ref <1.0)

## 2023-10-25 LAB — MAGNESIUM: Magnesium: 2 mg/dL (ref 1.7–2.4)

## 2023-10-25 MED ORDER — INSULIN GLARGINE-YFGN 100 UNIT/ML ~~LOC~~ SOLN
18.0000 [IU] | Freq: Every day | SUBCUTANEOUS | Status: DC
  Administered 2023-10-25 – 2023-10-26 (×2): 18 [IU] via SUBCUTANEOUS
  Filled 2023-10-25 (×3): qty 0.18

## 2023-10-25 MED ORDER — LIDOCAINE HCL (PF) 1 % IJ SOLN: 5.0000 mL | Freq: Once | INTRAMUSCULAR | Status: DC

## 2023-10-25 MED ORDER — POTASSIUM CHLORIDE CRYS ER 20 MEQ PO TBCR
40.0000 meq | EXTENDED_RELEASE_TABLET | Freq: Once | ORAL | Status: AC
  Administered 2023-10-25: 40 meq via ORAL
  Filled 2023-10-25: qty 2

## 2023-10-25 NOTE — Procedures (Signed)
Bedside procedure note   Preoperative Diagnosis: Left great toe abscess, left great toe cellulitis Postoperative Diagnosis: Left great toe abscess, left great toe cellulitis   Procedure(s) Performed: Incision and drainage of left great toe abscess   Performing provider: Santina Evans Kelleigh Skerritt, DO   Estimated Blood Loss: Minimal   Findings: Left great toe lateral abscess with moderate amount of thin purulent fluid evacuated; post incision and drainage wound with granulation tissue and fibrinous exudate at the base; small wound on her aspect of left great toe with granulation tissue at base   Procedure: At bedside, left great toe was prepped with Betadine.  Verbal consent obtained from the patient prior to beginning of procedure.  Timeout was performed.  Using scalpel, the skin overlying the left great toe abscess was excised to unroof the entire fluid collection.  A wound culture was obtained.  A moderate amount of thin purulent fluid was drained from the cavity.  There was pink granulation tissue with some fibrinous exudate noted at the base of the abscess cavity.  The cavity was irrigated with saline.  The wound and toe was then dressed with Xeroform, 4 x 4's, and Medipore tape.  Patient tolerated the procedure without issue.   Postprocedure pictures of the left great toe are in the media tab (patient consented to inclusion of this photo to his medical record).      Theophilus Kinds, DO [facility] [address] [place], [place] [address] [phone] (office)

## 2023-10-25 NOTE — Progress Notes (Signed)
PROGRESS NOTE    DANNER PAULDING  FAO:[id number] DOB: 07-05-1971 DOA: 10/24/2023 PCP: Patient, No Pcp Per   Brief Narrative:    NIKOLIS BERENT is a 53 y.o. male with medical history significant for poorly controlled type 2 diabetes, osteomyelitis with prior toe amputations, dyslipidemia, and hypertension who presented to the ED with complaints of left great toe wound/infection.  He was admitted for left great toe cellulitis/wound infection and was started on IV vancomycin.  He has undergone I&D with general surgery on 1/28 at bedside.  MRI suggesting possible mild osteomyelitis and cultures pending.  Assessment & Plan:   Principal Problem:   Cellulitis Active Problems:   HLD (hyperlipidemia)   Essential hypertension   Diabetic neuropathy (HCC)   Hyperglycemia   Abscess of left great toe  Assessment and Plan:  Left great toe wound infection with concern for osteomyelitis -MRI concerns for some possible osteomyelitis -Appreciate general surgery with I/D on 1/28 -Continue to follow culture results -Blood cultures with no growth noted thus far   Type 2 diabetes poorly controlled with hyperglycemia -Hemoglobin A1c 11.9% -Continue insulin and carb modified diet -Appreciate diabetes coordinator recommendations  Mild hypokalemia -Replete and reevaluate in a.m.   Hypertension -Continue amlodipine   Dyslipidemia -Continue statin    DVT prophylaxis: Lovenox Code Status: Full Family Communication: None at bedside Disposition Plan:  Status is: Observation The patient will require care spanning > 2 midnights and should be moved to inpatient because: Need for IV antibiotics.  Consultants:  General surgery  Procedures:  I/D to left great toe 1/28  Antimicrobials:  Anti-infectives (From admission, onward)    Start     Dose/Rate Route Frequency Ordered Stop   10/24/23 2300  vancomycin (VANCOREADY) IVPB 1750 mg/350 mL        1,750 mg 175 mL/hr over 120 Minutes  Intravenous 2 times daily 10/24/23 1457     10/24/23 1115  vancomycin (VANCOCIN) IVPB 1000 mg/200 mL premix  Status:  Discontinued        1,000 mg 200 mL/hr over 60 Minutes Intravenous  Once 10/24/23 1106 10/24/23 1109   10/24/23 1115  vancomycin (VANCOREADY) IVPB 2000 mg/400 mL        2,000 mg 200 mL/hr over 120 Minutes Intravenous  Once 10/24/23 1109 10/24/23 1531      Subjective: Patient seen and evaluated today with no new acute complaints or concerns. No acute concerns or events noted overnight.  He has undergone I/D with general surgery to his left great toe today.  Objective: Vitals:   10/24/23 1200 10/24/23 1220 10/24/23 2000 10/25/23 0500  BP:  (!) 148/104 (!) 141/97 (!) 139/95  Pulse: 91 88 83 79  Resp:   18 19  Temp:  98.1 F (36.7 C) 98.1 F (36.7 C) 98 F (36.7 C)  TempSrc:  Oral Oral Oral  SpO2: 95% 95% 98% 99%  Weight:      Height:        Intake/Output Summary (Last 24 hours) at 10/25/2023 1053 Last data filed at 10/25/2023 0615 Gross per 24 hour  Intake 750 ml  Output 800 ml  Net -50 ml   Filed Weights   10/24/23 0838  Weight: 97.5 kg    Examination:  General exam: Appears calm and comfortable  Respiratory system: Clear to auscultation. Respiratory effort normal. Cardiovascular system: S1 & S2 heard, RRR.  Gastrointestinal system: Abdomen is soft Central nervous system: Alert and awake Extremities: No edema, left great toe and dressings C/D/I.  Amputations to toes noted. Skin: No significant lesions noted Psychiatry: Flat affect.    Data Reviewed: I have personally reviewed following labs and imaging studies  CBC: Recent Labs  Lab 10/24/23 0931 10/25/23 0445  WBC 10.3 8.7  NEUTROABS 7.1  --   HGB 15.9 15.1  HCT 44.1 43.1  MCV 82.0 83.0  PLT 172 155   Basic Metabolic Panel: Recent Labs  Lab 10/24/23 0931 10/25/23 0445  NA 131* 137  K 3.5 3.3*  CL 98 104  CO2 23 24  GLUCOSE 410* 182*  BUN 14 21*  CREATININE 0.69 0.61  CALCIUM  9.4 9.0  MG  --  2.0   GFR: Estimated Creatinine Clearance: 132.8 mL/min (by C-G formula based on SCr of 0.61 mg/dL). Liver Function Tests: Recent Labs  Lab 10/24/23 0931  AST 12*  ALT 17  ALKPHOS 114  BILITOT 0.8  PROT 8.0  ALBUMIN 4.2   No results for input(s): "LIPASE", "AMYLASE" in the last 168 hours. No results for input(s): "AMMONIA" in the last 168 hours. Coagulation Profile: No results for input(s): "INR", "PROTIME" in the last 168 hours. Cardiac Enzymes: No results for input(s): "CKTOTAL", "CKMB", "CKMBINDEX", "TROPONINI" in the last 168 hours. BNP (last 3 results) No results for input(s): "PROBNP" in the last 8760 hours. HbA1C: Recent Labs    10/24/23 0931  HGBA1C 11.9*   CBG: Recent Labs  Lab 10/24/23 1222 10/24/23 1701 10/24/23 2033 10/25/23 0714  GLUCAP 339* 197* 192* 195*   Lipid Profile: No results for input(s): "CHOL", "HDL", "LDLCALC", "TRIG", "CHOLHDL", "LDLDIRECT" in the last 72 hours. Thyroid Function Tests: No results for input(s): "TSH", "T4TOTAL", "FREET4", "T3FREE", "THYROIDAB" in the last 72 hours. Anemia Panel: No results for input(s): "VITAMINB12", "FOLATE", "FERRITIN", "TIBC", "IRON", "RETICCTPCT" in the last 72 hours. Sepsis Labs: Recent Labs  Lab 10/24/23 0941  LATICACIDVEN 1.7    Recent Results (from the past 240 hours)  Blood Cultures x 2 sites     Status: None (Preliminary result)   Collection Time: 10/24/23  9:31 AM   Specimen: BLOOD  Result Value Ref Range Status   Specimen Description BLOOD BLOOD LEFT ARM ac  Final   Special Requests   Final    BOTTLES DRAWN AEROBIC AND ANAEROBIC Blood Culture adequate volume   Culture   Final    NO GROWTH < 24 HOURS Performed at [facility], [address], [place], [place] [address]    Report Status PENDING  Incomplete  Blood Cultures x 2 sites     Status: None (Preliminary result)   Collection Time: 10/24/23  9:41 AM   Specimen: BLOOD  Result Value Ref Range Status   Specimen  Description BLOOD BLOOD LEFT ARM wrist  Final   Special Requests   Final    BOTTLES DRAWN AEROBIC ONLY Blood Culture results may not be optimal due to an inadequate volume of blood received in culture bottles   Culture   Final    NO GROWTH < 24 HOURS Performed at [facility], [address] [place], [place] [address]    Report Status PENDING  Incomplete         Radiology Studies: MR FOOT LEFT W WO CONTRAST Result Date: 10/25/2023 CLINICAL DATA:  Soft tissue infection suspected, foot, xray done EXAM: MRI OF THE LEFT FOREFOOT WITHOUT AND WITH CONTRAST TECHNIQUE: Multiplanar, multisequence MR imaging of the left forefoot was performed both before and after administration of intravenous contrast. CONTRAST:  9.56mL GADAVIST GADOBUTROL 1 MMOL/ML IV SOLN  COMPARISON:  X-ray 10/24/2023, MRI 07/29/2022 FINDINGS: Bones/Joint/Cartilage Postsurgical changes from amputations of the second and third toes at the MTP joint level. No acute fracture or dislocation. Faint bone marrow edema within the distal tuft of the great toe distal phalanx without erosion or confluent low T1 marrow signal changes. No additional sites of bone marrow edema within the forefoot. Ligaments Intact Lisfranc ligament.  No collateral ligament injury. Muscles and Tendons Diffuse edema-like signal of the foot musculature which may represent changes related to denervation and/or myositis. Trace FHL tenosynovitis. Soft tissues Soft tissue swelling of the great toe with edema and enhancement compatible with cellulitis. There is a superficial cutaneous blister along the lateral aspect of the great toe the level of the distal phalanx measuring 1.9 x 0.6 x 1.8 cm. Mild subcutaneous edema along the dorsum of the forefoot. IMPRESSION: 1. Faint bone marrow edema within the distal tuft of the great toe distal phalanx without erosion or confluent low T1 marrow signal changes. Appearance favors early acute osteomyelitis. 2. Cellulitis of the great toe.  Superficial cutaneous blister along the lateral aspect of the great toe measuring 1.9 x 0.6 x 1.8 cm. 3. Diffuse edema-like signal of the foot musculature which may represent changes related to denervation and/or myositis. 4. Trace FHL tenosynovitis. Electronically Signed   By: Duanne Guess D.O.   On: 10/25/2023 08:34   DG Toe Great Left Result Date: 10/24/2023 CLINICAL DATA:  wound. EXAM: LEFT GREAT TOE COMPARISON:  10/28/2022. FINDINGS: No acute fracture or dislocation. No aggressive osseous lesion. Note is made of amputation of second and third toes at the level of metatarsophalangeal joint. Ankle mortise appears intact. There is focal soft tissue swelling around the distal phalanx of the great toe. There are lucent areas suggesting overlying soft tissue defect/air within the soft tissue. No erosions seen in the underlying bone. No radiopaque foreign bodies. IMPRESSION: *No radiographic evidence of acute osteomyelitis. There is focal soft tissue swelling/defect and air within the soft tissue surrounding the distal phalanx of the great toe. Correlate with physical examination. Electronically Signed   By: Jules Schick M.D.   On: 10/24/2023 09:42        Scheduled Meds:  enoxaparin (LOVENOX) injection  40 mg Subcutaneous Q24H   insulin aspart  0-15 Units Subcutaneous TID WC   insulin aspart  0-5 Units Subcutaneous QHS   insulin aspart  4 Units Subcutaneous TID WC   lidocaine (PF)  5 mL Intradermal Once   LORazepam  0.5 mg Intravenous Once   Continuous Infusions:  vancomycin 1,750 mg (10/25/23 0904)     LOS: 0 days    Time spent: 55 minutes    Chisa Kushner D Sherryll Burger, DO [facility]  If 7PM-7AM, please contact night-coverage [email or web address] 10/25/2023, 10:53 AM

## 2023-10-25 NOTE — Discharge Instructions (Addendum)
Providers Accepting New Patients in [place], [place]    [facility] [address]  [place], [place] [address] [phone] Accepts most insurances  [facility] [address] [place], [place] [address] [phone] Accepts most insurances  [facility] [address] [place], [place] [address]  [phone] Must meet requirements  [facility] [address] [place], [place] [address] [phone] Accepts most insurances  [facility] [address]  [place], [place] [address] [phone] Accepts most insurances  [facility] [address]   [place], [place]   [phone] Accepts most insurances  [facility])  [phone]  [place], [place] [address] [phone] Accepts most insurances     [facility] [address]  [place], [place] [address] [phone] Accepts most insurances  [facility] Department [address] [place], [place] [address] [phone] option 1 Accepts Medicaid and [facility] [address]  [place], [place] [address] [phone] Accepts most insurances  Avon Gully, MD [address] [place], [place] [address] [phone] Accepts most insurances  [facility] at [facility] [address]  [place], [place] [address] [phone] Accepts most insurances  [facility] [phone] [address] [place], [place] [address] [phone] Accepts most insurances  [place], [place] [address], [address] [place], [place] [address] [phone]  Accepts most insurances   [facility] [phone]) Dr. Carlus Pavlov Dr. Reather Littler A. [facility] [phone]) Dr. Talmage [facility] [phone]) Dr. Dorisann Frames Dr. Darci Needle [facility] [phone]) Dr. Deirdre Pippins [facility] [phone]) [[place] office]  [phone]) Dan Humphreys office] Dr. Wendall Mola Dr. Verdis Frederickson [facility]) [phone]) Autumn Pearletha Forge Yetta Barre), [place] Dr. Izell [place] Dr. Jillyn Ledger. [facility] [phone]) Dr. Marquis Lunch [facility] [phone]) Dr. Jerelyn Scott Dr. Dessa Phi Dr. Gerome Sam, FNP Dr. Girtha Hake. [place] in [place] [phone])     Wound care instructions: -Change dressing daily.  Do not submerge left great toe in water.  May clean with soap and water daily -Wrap left great toe with Xeroform gauze then wrap 2 inch gauze roll around toe to cover Xeroform.  Tape in place with Medipore tape -Follow up with podiatry for management of wound

## 2023-10-25 NOTE — Inpatient Diabetes Management (Signed)
Inpatient Diabetes Program Recommendations  AACE/ADA: New Consensus Statement on Inpatient Glycemic Control (2015)  Target Ranges:  Prepandial:   less than 140 mg/dL      Peak postprandial:   less than 180 mg/dL (1-2 hours)      Critically ill patients:  140 - 180 mg/dL   Lab Results  Component Value Date   GLUCAP 195 (H) 10/25/2023   HGBA1C 11.9 (H) 10/24/2023    Review of Glycemic Control  Latest Reference Range & Units 10/24/23 12:22 10/24/23 17:01 10/24/23 20:33 10/25/23 07:14  Glucose-Capillary 70 - 99 mg/dL 161 (H) 096 (H) 045 (H) 195 (H)  (H): Data is abnormally high Diabetes history: Type 2 DM Outpatient Diabetes medications: Basaglar 28-30 units QD Current orders for Inpatient glycemic control: Novolog 4 units TID, Novolog 0-15 units TID & HS  Inpatient Diabetes Program Recommendations:    Consider adding Semglee 18 units every day  Spoke with patient by phone to discussed diabetes management. Patient verifies home medications and denies missed doses. Is interested in CGM.  Reviewed patient's current A1c of 11.9%. Explained what a A1c is and what it measures. Also reviewed goal A1c with patient, importance of good glucose control @ home, and blood sugar goals. Reviewed patho of DM, need for improved control, survival skills, interventions, CGM, vascular changes and commorbidities.  Has a meter and testing supplies, however, uses only to "spot check". Reviewed recommended frequency for checking blood sugars in order to safety increase based on needs.  Patient admits to drinking sugary beverages. Reviewed alternatives and importance of incorporating protein into diet. Patient reports planning to make changes.  Endocrinology list attached to DC summary.  Awaiting pharmacy to check on CGM.    Thanks, Lujean Rave, MSN, RNC-OB Diabetes Coordinator [phone] (8a-5p)

## 2023-10-25 NOTE — Plan of Care (Signed)
Problem: Education: Goal: Knowledge of General Education information will improve Description: Including pain rating scale, medication(s)/side effects and non-pharmacologic comfort measures Outcome: Progressing   Problem: Nutrition: Goal: Adequate nutrition will be maintained Outcome: Progressing   Problem: Coping: Goal: Level of anxiety will decrease Outcome: Progressing   Problem: Skin Integrity: Goal: Risk for impaired skin integrity will decrease Outcome: Not Progressing

## 2023-10-25 NOTE — Progress Notes (Signed)
Transition of Care Department [facility]) has reviewed patient and no other TOC needs have been identified at this time. We will continue to monitor patient advancement through interdisciplinary progression rounds. If new patient transition needs arise, please place a TOC consult.   10/25/23 0856  TOC Brief Assessment  Insurance and Status Reviewed  Patient has primary care physician No (Will add PCP list to AVS.)  Home environment has been reviewed Lives with mother.  Prior level of function: Independent.  Prior/Current Home Services No current home services  Social Drivers of Health Review SDOH reviewed no interventions necessary  Readmission risk has been reviewed Yes  Transition of care needs no transition of care needs at this time

## 2023-10-25 NOTE — Consult Note (Signed)
[facility] Consult  Reason for Consult: Left great toe wound Referring Physician: Lurena Nida, PA-C  Chief Complaint   Foot Pain; Groin Pain     HPI: Albert Mcdonald is a 52 y.o. male who was admitted to the hospital with left great toe cellulitis.  He states that he first noticed a wound on his toe about 2 weeks ago and started having pain and swelling over the weekend, which prompted his presentation to the emergency department.  He has had a second and third toe amputation in the past for osteomyelitis, and followed with a podiatrist after the second toe amputation for a chronic wound.  He last saw his podiatrist about 2 months ago.  He denies any fevers or chills at home.  He does feel like he is got a burning sensation in his foot but denies significant pain.  His past medical history is significant for hypertension, hyperlipidemia, and diabetes.  His surgical history significant for 3 previous toe amputations, 2 on the left and 1 on the right.  He is not currently taking any blood thinning medications.  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 ALS   Diabetes mellitus    Family history of anesthesia complication    Hyperlipidemia    Hypertension    Kleine-Levin syndrome    OCD (obsessive compulsive disorder)    Restless leg     Past Surgical History:  Procedure Laterality Date   AMPUTATION Right 02/26/2022   Procedure: AMPUTATION 5th TOE;  Surgeon: Nadara Mustard, MD;  Location: [facility] OR;  Service: Orthopedics;  Laterality: Right;   AMPUTATION TOE Left 07/30/2022   Procedure: AMPUTATION TOE, left third toe;  Surgeon: Lucretia Roers, MD;  Location: [facility];  Service: General;  Laterality: Left;   AMPUTATION TOE Left 11/01/2022   Procedure: AMPUTATION TOE, LEFT SECOND;  Surgeon: Lucretia Roers, MD;  Location: [facility];  Service: General;  Laterality: Left;   ANTERIOR CERVICAL  DECOMP/DISCECTOMY FUSION  11/09/2012   Procedure: ANTERIOR CERVICAL DECOMPRESSION/DISCECTOMY FUSION 3 LEVELS C4-C7 ;  Surgeon: Venita Lick, MD;  Location: [facility] OR;  Service: Orthopedics;;  ACDF C4-C7     Family History  Problem Relation Age of Onset   Diabetes Mother    Cancer - Colon Neg Hx    Colon polyps Neg Hx     Social History   Tobacco Use   Smoking status: Never   Smokeless tobacco: Never  Vaping Use   Vaping status: Never Used  Substance Use Topics   Alcohol use: Not Currently   Drug use: No    Medications: I have reviewed the patient's current medications.  Allergies  Allergen Reactions   Ace Inhibitors Rash    Tolerates lisinopril     ROS:  Pertinent items are noted in HPI.  Blood pressure (!) 139/95, pulse 79, temperature 98 F (36.7 C), temperature source Oral, resp. rate 19, height 6\' 1"  (1.854 m), weight 97.5 kg, SpO2 99%. Physical Exam Vitals reviewed.  Constitutional:      Appearance: Normal appearance.  HENT:     Head: Normocephalic and atraumatic.  Eyes:     Extraocular Movements: Extraocular movements intact.     Pupils: Pupils are equal, round, and reactive to light.  Cardiovascular:     Rate and Rhythm: Normal rate.     Pulses:          Dorsalis  pedis pulses are 2+ on the right side and 2+ on the left side.       Posterior tibial pulses are 2+ on the right side and 2+ on the left side.  Pulmonary:     Effort: Pulmonary effort is normal.  Abdominal:     General: There is no distension.     Palpations: Abdomen is soft.     Tenderness: There is no abdominal tenderness.  Musculoskeletal:        General: Normal range of motion.     Cervical back: Normal range of motion.  Feet:     Comments: Left foot with surgically absent second and third toes; left great toe with hyperkeratotic changes at the distal tip with a posterior open wound and palpable fluid collection in the lateral aspect, nontender to palpation, mild erythema and edema;  erythema and edema limited to the great toe; right fifth toe surgically absent Skin:    General: Skin is warm and dry.  Neurological:     General: No focal deficit present.     Mental Status: He is alert and oriented to person, place, and time.  Psychiatric:        Mood and Affect: Mood normal.        Behavior: Behavior normal.       Results: Results for orders placed or performed during the hospital encounter of 10/24/23 (from the past 48 hours)  Comprehensive metabolic panel     Status: Abnormal   Collection Time: 10/24/23  9:31 AM  Result Value Ref Range   Sodium 131 (L) 135 - 145 mmol/L   Potassium 3.5 3.5 - 5.1 mmol/L   Chloride 98 98 - 111 mmol/L   CO2 23 22 - 32 mmol/L   Glucose, Bld 410 (H) 70 - 99 mg/dL    Comment: Glucose reference range applies only to samples taken after fasting for at least 8 hours.   BUN 14 6 - 20 mg/dL   Creatinine, Ser 8.65 0.61 - 1.24 mg/dL   Calcium 9.4 8.9 - 78.4 mg/dL   Total Protein 8.0 6.5 - 8.1 g/dL   Albumin 4.2 3.5 - 5.0 g/dL   AST 12 (L) 15 - 41 U/L   ALT 17 0 - 44 U/L   Alkaline Phosphatase 114 38 - 126 U/L   Total Bilirubin 0.8 0.0 - 1.2 mg/dL   GFR, Estimated >69 >62 mL/min    Comment: (NOTE) Calculated using the CKD-EPI Creatinine Equation (2021)    Anion gap 10 5 - 15    Comment: Performed at [facility], [address], [place], [place] [address]  CBC with Differential     Status: Abnormal   Collection Time: 10/24/23  9:31 AM  Result Value Ref Range   WBC 10.3 4.0 - 10.5 K/uL   RBC 5.38 4.22 - 5.81 MIL/uL   Hemoglobin 15.9 13.0 - 17.0 g/dL   HCT 13.2 44.0 - 10.2 %   MCV 82.0 80.0 - 100.0 fL   MCH 29.6 26.0 - 34.0 pg   MCHC 36.1 (H) 30.0 - 36.0 g/dL   RDW 72.5 36.6 - 44.0 %   Platelets 172 150 - 400 K/uL   nRBC 0.0 0.0 - 0.2 %   Neutrophils Relative % 68 %   Neutro Abs 7.1 1.7 - 7.7 K/uL   Lymphocytes Relative 22 %   Lymphs Abs 2.3 0.7 - 4.0 K/uL   Monocytes Relative 7 %   Monocytes Absolute 0.7 0.1 - 1.0 K/uL  Eosinophils Relative 1 %   Eosinophils Absolute 0.1 0.0 - 0.5 K/uL   Basophils Relative 1 %   Basophils Absolute 0.1 0.0 - 0.1 K/uL   Immature Granulocytes 1 %   Abs Immature Granulocytes 0.07 0.00 - 0.07 K/uL    Comment: Performed at [facility], [address] [place], [place] [address]  Blood Cultures x 2 sites     Status: None (Preliminary result)   Collection Time: 10/24/23  9:31 AM   Specimen: BLOOD  Result Value Ref Range   Specimen Description BLOOD BLOOD LEFT ARM ac    Special Requests      BOTTLES DRAWN AEROBIC AND ANAEROBIC Blood Culture adequate volume   Culture      NO GROWTH < 24 HOURS Performed at [facility], [address] [place], [place] [address]    Report Status PENDING   Hemoglobin A1c     Status: Abnormal   Collection Time: 10/24/23  9:31 AM  Result Value Ref Range   Hgb A1c MFr Bld 11.9 (H) 4.8 - 5.6 %    Comment: (NOTE) Pre diabetes:          5.7%-6.4%  Diabetes:              >6.4%  Glycemic control for   <7.0% adults with diabetes    Mean Plasma Glucose 294.83 mg/dL    Comment: Performed at [facility], [address] [place], [place] [address]  Lactic acid     Status: None   Collection Time: 10/24/23  9:41 AM  Result Value Ref Range   Lactic Acid, Venous 1.7 0.5 - 1.9 mmol/L    Comment: Performed at [facility], [address], [place], [place] [address]  Blood Cultures x 2 sites     Status: None (Preliminary result)   Collection Time: 10/24/23  9:41 AM   Specimen: BLOOD  Result Value Ref Range   Specimen Description BLOOD BLOOD LEFT ARM wrist    Special Requests      BOTTLES DRAWN AEROBIC ONLY Blood Culture results may not be optimal due to an inadequate volume of blood received in culture bottles   Culture      NO GROWTH < 24 HOURS Performed at [facility], [address] [place], [place] [address]    Report Status PENDING   Glucose, capillary     Status: Abnormal   Collection Time: 10/24/23 12:22 PM  Result Value Ref  Range   Glucose-Capillary 339 (H) 70 - 99 mg/dL    Comment: Glucose reference range applies only to samples taken after fasting for at least 8 hours.  Glucose, capillary     Status: Abnormal   Collection Time: 10/24/23  5:01 PM  Result Value Ref Range   Glucose-Capillary 197 (H) 70 - 99 mg/dL    Comment: Glucose reference range applies only to samples taken after fasting for at least 8 hours.  Glucose, capillary     Status: Abnormal   Collection Time: 10/24/23  8:33 PM  Result Value Ref Range   Glucose-Capillary 192 (H) 70 - 99 mg/dL    Comment: Glucose reference range applies only to samples taken after fasting for at least 8 hours.  Magnesium     Status: None   Collection Time: 10/25/23  4:45 AM  Result Value Ref Range   Magnesium 2.0 1.7 - 2.4 mg/dL    Comment: Performed at [facility], [address] [place], [place] [address]  Basic metabolic panel  Status: Abnormal   Collection Time: 10/25/23  4:45 AM  Result Value Ref Range   Sodium 137 135 - 145 mmol/L   Potassium 3.3 (L) 3.5 - 5.1 mmol/L   Chloride 104 98 - 111 mmol/L   CO2 24 22 - 32 mmol/L   Glucose, Bld 182 (H) 70 - 99 mg/dL    Comment: Glucose reference range applies only to samples taken after fasting for at least 8 hours.   BUN 21 (H) 6 - 20 mg/dL   Creatinine, Ser 9.60 0.61 - 1.24 mg/dL   Calcium 9.0 8.9 - 45.4 mg/dL   GFR, Estimated >09 >81 mL/min    Comment: (NOTE) Calculated using the CKD-EPI Creatinine Equation (2021)    Anion gap 9 5 - 15    Comment: Performed at [facility], [address], [place], [place] [address]  CBC     Status: None   Collection Time: 10/25/23  4:45 AM  Result Value Ref Range   WBC 8.7 4.0 - 10.5 K/uL   RBC 5.19 4.22 - 5.81 MIL/uL   Hemoglobin 15.1 13.0 - 17.0 g/dL   HCT 82.9 56.2 - 13.0 %   MCV 83.0 80.0 - 100.0 fL   MCH 29.1 26.0 - 34.0 pg   MCHC 35.0 30.0 - 36.0 g/dL   RDW 86.5 78.4 - 69.6 %   Platelets 155 150 - 400 K/uL   nRBC 0.0 0.0 - 0.2 %    Comment: Performed  at [facility], [address] [place], [place] [address]  Glucose, capillary     Status: Abnormal   Collection Time: 10/25/23  7:14 AM  Result Value Ref Range   Glucose-Capillary 195 (H) 70 - 99 mg/dL    Comment: Glucose reference range applies only to samples taken after fasting for at least 8 hours.    MR FOOT LEFT W WO CONTRAST Result Date: 10/25/2023 CLINICAL DATA:  Soft tissue infection suspected, foot, xray done EXAM: MRI OF THE LEFT FOREFOOT WITHOUT AND WITH CONTRAST TECHNIQUE: Multiplanar, multisequence MR imaging of the left forefoot was performed both before and after administration of intravenous contrast. CONTRAST:  9.62mL GADAVIST GADOBUTROL 1 MMOL/ML IV SOLN COMPARISON:  X-ray 10/24/2023, MRI 07/29/2022 FINDINGS: Bones/Joint/Cartilage Postsurgical changes from amputations of the second and third toes at the MTP joint level. No acute fracture or dislocation. Faint bone marrow edema within the distal tuft of the great toe distal phalanx without erosion or confluent low T1 marrow signal changes. No additional sites of bone marrow edema within the forefoot. Ligaments Intact Lisfranc ligament.  No collateral ligament injury. Muscles and Tendons Diffuse edema-like signal of the foot musculature which may represent changes related to denervation and/or myositis. Trace FHL tenosynovitis. Soft tissues Soft tissue swelling of the great toe with edema and enhancement compatible with cellulitis. There is a superficial cutaneous blister along the lateral aspect of the great toe the level of the distal phalanx measuring 1.9 x 0.6 x 1.8 cm. Mild subcutaneous edema along the dorsum of the forefoot. IMPRESSION: 1. Faint bone marrow edema within the distal tuft of the great toe distal phalanx without erosion or confluent low T1 marrow signal changes. Appearance favors early acute osteomyelitis. 2. Cellulitis of the great toe. Superficial cutaneous blister along the lateral aspect of the great toe measuring 1.9  x 0.6 x 1.8 cm. 3. Diffuse edema-like signal of the foot musculature which may represent changes related to denervation and/or myositis. 4. Trace FHL tenosynovitis. Electronically Signed   By: Janyth Pupa  Plundo D.O.   On: 10/25/2023 08:34   DG Toe Great Left Result Date: 10/24/2023 CLINICAL DATA:  wound. EXAM: LEFT GREAT TOE COMPARISON:  10/28/2022. FINDINGS: No acute fracture or dislocation. No aggressive osseous lesion. Note is made of amputation of second and third toes at the level of metatarsophalangeal joint. Ankle mortise appears intact. There is focal soft tissue swelling around the distal phalanx of the great toe. There are lucent areas suggesting overlying soft tissue defect/air within the soft tissue. No erosions seen in the underlying bone. No radiopaque foreign bodies. IMPRESSION: *No radiographic evidence of acute osteomyelitis. There is focal soft tissue swelling/defect and air within the soft tissue surrounding the distal phalanx of the great toe. Correlate with physical examination. Electronically Signed   By: Jules Schick M.D.   On: 10/24/2023 09:42     Assessment & Plan:  Albert Mcdonald is a 53 y.o. male who was admitted with cellulitis of the left great toe.  X-ray did not demonstrate any concern for osteomyelitis.  -Upon my evaluation, patient has a fluid collection to the left lateral aspect of the great toe.  I discussed unroofing this blister/abscess with the patient -The risk and benefits of incision and drainage of left toe abscess were discussed including but not limited to bleeding, infection, poor wound healing.  After careful consideration, Albert Mcdonald has decided to proceed with bedside incision and drainage of left great toe abscess. -Please refer to separate note for details of procedure. Post procedure photos included in chart -Left great toe dressed with Xeroform gauze and dry gauze over top -Patient underwent an MRI which is showing possible early acute  osteomyelitis without any erosive changes and a left lateral great toe blister -Patient currently without leukocytosis and already missing second and third toes from the left foot.  Given that there are no erosive changes, I would like to try conservative measures with antibiotics and wound care prior to proceeding with great toe amputation, as this will likely result with balance/ambulatory issues for the patient -Continue IV vancomycin -Follow-up left great toe wound culture -Would recommend that the patient follow-up with podiatry outpatient for management of this wound and evaluation for possible need for amputation in the future if he fails conservative management  All questions were answered to the satisfaction of the patient.  -- Theophilus Kinds, DO [facility] [address] [place], [place] [address] [phone] (office)

## 2023-10-26 DIAGNOSIS — R739 Hyperglycemia, unspecified: Secondary | ICD-10-CM | POA: Diagnosis not present

## 2023-10-26 DIAGNOSIS — L02612 Cutaneous abscess of left foot: Secondary | ICD-10-CM | POA: Diagnosis not present

## 2023-10-26 DIAGNOSIS — L089 Local infection of the skin and subcutaneous tissue, unspecified: Secondary | ICD-10-CM

## 2023-10-26 DIAGNOSIS — T148XXA Other injury of unspecified body region, initial encounter: Secondary | ICD-10-CM

## 2023-10-26 DIAGNOSIS — I10 Essential (primary) hypertension: Secondary | ICD-10-CM | POA: Diagnosis not present

## 2023-10-26 LAB — GLUCOSE, CAPILLARY
Glucose-Capillary: 211 mg/dL — ABNORMAL HIGH (ref 70–99)
Glucose-Capillary: 207 mg/dL — ABNORMAL HIGH (ref 70–99)

## 2023-10-26 LAB — CBC
HCT: 41.7 % (ref 39.0–52.0)
Hemoglobin: 15 g/dL (ref 13.0–17.0)
MCH: 30 pg (ref 26.0–34.0)
MCHC: 36 g/dL (ref 30.0–36.0)
MCV: 83.4 fL (ref 80.0–100.0)
Platelets: 166 10*3/uL (ref 150–400)
RBC: 5 MIL/uL (ref 4.22–5.81)
RDW: 12.3 % (ref 11.5–15.5)
WBC: 7.6 10*3/uL (ref 4.0–10.5)
nRBC: 0 % (ref 0.0–0.2)

## 2023-10-26 LAB — BASIC METABOLIC PANEL
Anion gap: 9 (ref 5–15)
BUN: 21 mg/dL — ABNORMAL HIGH (ref 6–20)
CO2: 26 mmol/L (ref 22–32)
Calcium: 9 mg/dL (ref 8.9–10.3)
Chloride: 102 mmol/L (ref 98–111)
Creatinine, Ser: 0.68 mg/dL (ref 0.61–1.24)
GFR, Estimated: >60 mL/min (ref >60)
Glucose, Bld: 167 mg/dL — ABNORMAL HIGH (ref 70–99)
Potassium: 3.6 mmol/L (ref 3.5–5.1)
Sodium: 137 mmol/L (ref 135–145)

## 2023-10-26 LAB — MAGNESIUM: Magnesium: 1.8 mg/dL (ref 1.7–2.4)

## 2023-10-26 MED ORDER — DOXYCYCLINE HYCLATE 100 MG PO CAPS: 100.0000 mg | ORAL_CAPSULE | Freq: Two times a day (BID) | ORAL | 0 refills | Status: AC

## 2023-10-26 MED ORDER — AMOXICILLIN-POT CLAVULANATE 875-125 MG PO TABS: 1.0000 | ORAL_TABLET | Freq: Two times a day (BID) | ORAL | 0 refills | Status: DC

## 2023-10-26 MED ORDER — AMLODIPINE BESYLATE 5 MG PO TABS: 5.0000 mg | ORAL_TABLET | Freq: Every day | ORAL | 2 refills | Status: DC

## 2023-10-26 MED ORDER — SACCHAROMYCES BOULARDII 250 MG PO CAPS: 250.0000 mg | ORAL_CAPSULE | Freq: Two times a day (BID) | ORAL | 0 refills | Status: DC

## 2023-10-26 NOTE — Discharge Summary (Signed)
Physician Discharge Summary  REN ASPINALL GNF:[id number] DOB: 12-08-1970 DOA: 10/24/2023   Admit date: 10/24/2023 Discharge date: 10/26/2023  Disposition: Home with [facility]   Recommendations for Outpatient Follow-up:  Follow up with podiatrist in 3-5 days for recheck Follow up with a  PCP in 7-10 days  Please follow up on the following pending results: final wound culture data (still pending at DC)  Home Health: RN for wound care  Wound care instructions: -Change dressing daily.  Do not submerge left great toe in water.  May clean with soap and water daily -Wrap left great toe with Xeroform gauze then wrap 2 inch gauze roll around toe to cover Xeroform.  Tape in place with Medipore tape -Follow up with podiatry for management of wound  Discharge Condition: STABLE   CODE STATUS: FULL DIET: Carb modified    Brief Hospitalization Summary: Please see all hospital notes, images, labs for full details of the hospitalization. 53 y.o. male with medical history significant for poorly controlled type 2 diabetes, osteomyelitis with prior toe amputations, dyslipidemia, and hypertension who presented to the ED with complaints of left great toe wound/infection.  He was admitted for left great toe cellulitis/wound infection and was started on IV vancomycin.  He has undergone I&D with general surgery on 1/28 at bedside.  MRI suggesting possible mild osteomyelitis and cultures pending.    Left great toe wound infection with concern for osteomyelitis -MRI concerns for some possible osteomyelitis -Appreciate general surgery with I/D on 1/28 and conservative management -pt electing to follow up with podiatrist for ongoing management  -Blood cultures with no growth noted thus far -prelim cultures: MSSA, strep agalactiae, GNR -DC on oral doxycycline 100 mg BID and augmentin 875 mg BID    Type 2 diabetes Uncontrolled with hyperglycemia -Hemoglobin A1c 11.9% -Continue insulin and carb modified  diet -Appreciate diabetes coordinator recommendations   Mild hypokalemia -Repleted    Hypertension -Continue amlodipine   Dyslipidemia -establish care with PCP to discuss further mgmt   Discharge Diagnoses:  Principal Problem:   Abscess of left great toe Active Problems:   HLD (hyperlipidemia)   Essential hypertension   Diabetic neuropathy (HCC)   Cellulitis   Hyperglycemia   Wound infection   Discharge Instructions: Discharge Instructions     AMB Referral VBCI Care Management   Complete by: As directed    Discussed CGM. Patient with learning disability. Denies missed doses, plans to eliminate sugary beverages.   Expected date of contact: Emergent - 3 Days   Service: Pharmacy   Pharmacy Service For:  Disease Management Medication Adherence     Disease states to manage: Diabetes      Allergies as of 10/26/2023       Reactions   Ace Inhibitors Rash   Tolerates lisinopril        Medication List     TAKE these medications    acetaminophen 500 MG tablet Commonly known as: TYLENOL Take 1,000 mg by mouth every 6 (six) hours as needed for moderate pain (pain score 4-6).   amLODipine 5 MG tablet Commonly known as: NORVASC Take 1 tablet (5 mg total) by mouth daily.   amoxicillin-clavulanate 875-125 MG tablet Commonly known as: AUGMENTIN Take 1 tablet by mouth 2 (two) times daily for 14 days.   Basaglar KwikPen 100 UNIT/ML Inject 28-30 Units into the skin as needed (low blood sugar). What changed: Another medication with the same name was removed. Continue taking this medication, and follow the directions you see here.  blood glucose meter kit and supplies Dispense based on patient and insurance preference. Use up to four times daily as directed. (FOR ICD-10 E10.9, E11.9).   doxycycline 100 MG capsule Commonly known as: VIBRAMYCIN Take 1 capsule (100 mg total) by mouth 2 (two) times daily for 14 days.   Insulin Pen Needle 32G X 4 MM Misc 1 each by  Does not apply route 3 (three) times daily.   saccharomyces boulardii 250 MG capsule Commonly known as: Florastor Take 1 capsule (250 mg total) by mouth 2 (two) times daily.        Follow-up Information     Podiatrist. Schedule an appointment as soon as possible for a visit in 3 day(s).   Why: Hospital Follow Up and wound check        Primary Care Provider. Schedule an appointment as soon as possible for a visit in 1 week(s).   Why: Hospital Follow Up               Allergies  Allergen Reactions   Ace Inhibitors Rash    Tolerates lisinopril   Allergies as of 10/26/2023       Reactions   Ace Inhibitors Rash   Tolerates lisinopril        Medication List     TAKE these medications    acetaminophen 500 MG tablet Commonly known as: TYLENOL Take 1,000 mg by mouth every 6 (six) hours as needed for moderate pain (pain score 4-6).   amLODipine 5 MG tablet Commonly known as: NORVASC Take 1 tablet (5 mg total) by mouth daily.   amoxicillin-clavulanate 875-125 MG tablet Commonly known as: AUGMENTIN Take 1 tablet by mouth 2 (two) times daily for 14 days.   Basaglar KwikPen 100 UNIT/ML Inject 28-30 Units into the skin as needed (low blood sugar). What changed: Another medication with the same name was removed. Continue taking this medication, and follow the directions you see here.   blood glucose meter kit and supplies Dispense based on patient and insurance preference. Use up to four times daily as directed. (FOR ICD-10 E10.9, E11.9).   doxycycline 100 MG capsule Commonly known as: VIBRAMYCIN Take 1 capsule (100 mg total) by mouth 2 (two) times daily for 14 days.   Insulin Pen Needle 32G X 4 MM Misc 1 each by Does not apply route 3 (three) times daily.   saccharomyces boulardii 250 MG capsule Commonly known as: Florastor Take 1 capsule (250 mg total) by mouth 2 (two) times daily.        Procedures/Studies: MR FOOT LEFT W WO CONTRAST Result Date:  10/25/2023 CLINICAL DATA:  Soft tissue infection suspected, foot, xray done EXAM: MRI OF THE LEFT FOREFOOT WITHOUT AND WITH CONTRAST TECHNIQUE: Multiplanar, multisequence MR imaging of the left forefoot was performed both before and after administration of intravenous contrast. CONTRAST:  9.68mL GADAVIST GADOBUTROL 1 MMOL/ML IV SOLN COMPARISON:  X-ray 10/24/2023, MRI 07/29/2022 FINDINGS: Bones/Joint/Cartilage Postsurgical changes from amputations of the second and third toes at the MTP joint level. No acute fracture or dislocation. Faint bone marrow edema within the distal tuft of the great toe distal phalanx without erosion or confluent low T1 marrow signal changes. No additional sites of bone marrow edema within the forefoot. Ligaments Intact Lisfranc ligament.  No collateral ligament injury. Muscles and Tendons Diffuse edema-like signal of the foot musculature which may represent changes related to denervation and/or myositis. Trace FHL tenosynovitis. Soft tissues Soft tissue swelling of the great toe with edema and enhancement  compatible with cellulitis. There is a superficial cutaneous blister along the lateral aspect of the great toe the level of the distal phalanx measuring 1.9 x 0.6 x 1.8 cm. Mild subcutaneous edema along the dorsum of the forefoot. IMPRESSION: 1. Faint bone marrow edema within the distal tuft of the great toe distal phalanx without erosion or confluent low T1 marrow signal changes. Appearance favors early acute osteomyelitis. 2. Cellulitis of the great toe. Superficial cutaneous blister along the lateral aspect of the great toe measuring 1.9 x 0.6 x 1.8 cm. 3. Diffuse edema-like signal of the foot musculature which may represent changes related to denervation and/or myositis. 4. Trace FHL tenosynovitis. Electronically Signed   By: Duanne Guess D.O.   On: 10/25/2023 08:34   DG Toe Great Left Result Date: 10/24/2023 CLINICAL DATA:  wound. EXAM: LEFT GREAT TOE COMPARISON:  10/28/2022.  FINDINGS: No acute fracture or dislocation. No aggressive osseous lesion. Note is made of amputation of second and third toes at the level of metatarsophalangeal joint. Ankle mortise appears intact. There is focal soft tissue swelling around the distal phalanx of the great toe. There are lucent areas suggesting overlying soft tissue defect/air within the soft tissue. No erosions seen in the underlying bone. No radiopaque foreign bodies. IMPRESSION: *No radiographic evidence of acute osteomyelitis. There is focal soft tissue swelling/defect and air within the soft tissue surrounding the distal phalanx of the great toe. Correlate with physical examination. Electronically Signed   By: Jules Schick M.D.   On: 10/24/2023 09:42     Subjective: No complaints.  Says he will follow up with podiatrist for ongoing care of his foot.  Says he will seek to establish care with a PCP.   Discharge Exam: Vitals:   10/25/23 2004 10/26/23 0555  BP: 139/83 (!) 143/92  Pulse: 78 72  Resp: 20 18  Temp: 98 F (36.7 C) 98.2 F (36.8 C)  SpO2: 97% 97%   Vitals:   10/25/23 0500 10/25/23 1330 10/25/23 2004 10/26/23 0555  BP: (!) 139/95 (!) 139/93 139/83 (!) 143/92  Pulse: 79 76 78 72  Resp: 19 18 20 18   Temp: 98 F (36.7 C) 99.5 F (37.5 C) 98 F (36.7 C) 98.2 F (36.8 C)  TempSrc: Oral Oral Oral Oral  SpO2: 99% 97% 97% 97%  Weight:      Height:       General: Pt is alert, awake, not in acute distress Cardiovascular: RRR, S1/S2 +, no rubs, no gallops Respiratory: CTA bilaterally, no wheezing, no rhonchi Abdominal: Soft, NT, ND, bowel sounds + Extremities: toe wound clean dry intact   The results of significant diagnostics from this hospitalization (including imaging, microbiology, ancillary and laboratory) are listed below for reference.     Microbiology: Recent Results (from the past 240 hours)  Blood Cultures x 2 sites     Status: None (Preliminary result)   Collection Time: 10/24/23  9:31 AM    Specimen: BLOOD  Result Value Ref Range Status   Specimen Description BLOOD BLOOD LEFT ARM ac  Final   Special Requests   Final    BOTTLES DRAWN AEROBIC AND ANAEROBIC Blood Culture adequate volume   Culture   Final    NO GROWTH 2 DAYS Performed at [facility], [address], [place], [place] [address]    Report Status PENDING  Incomplete  Blood Cultures x 2 sites     Status: None (Preliminary result)   Collection Time: 10/24/23  9:41 AM   Specimen:  BLOOD  Result Value Ref Range Status   Specimen Description BLOOD BLOOD LEFT ARM wrist  Final   Special Requests   Final    BOTTLES DRAWN AEROBIC ONLY Blood Culture results may not be optimal due to an inadequate volume of blood received in culture bottles   Culture   Final    NO GROWTH 2 DAYS Performed at [facility], [address], [place], [place] [address]    Report Status PENDING  Incomplete  Aerobic/Anaerobic Culture w Gram Stain (surgical/deep wound)     Status: None (Preliminary result)   Collection Time: 10/25/23  9:18 AM   Specimen: Wound; Abscess  Result Value Ref Range Status   Specimen Description   Final    WOUND Performed at [facility], [address], [place], [place] [address]    Special Requests   Final    NONE Performed at [facility], [address], [place], [place] [address]    Gram Stain   Final    FEW WBC PRESENT, PREDOMINANTLY PMN FEW GRAM NEGATIVE RODS FEW GRAM POSITIVE COCCI    Culture   Final    FEW GROUP B STREP(S.AGALACTIAE)ISOLATED TESTING AGAINST S. AGALACTIAE NOT ROUTINELY PERFORMED DUE TO PREDICTABILITY OF AMP/PEN/VAN SUSCEPTIBILITY. Performed at [facility], [address], [place], [place] [address]    Report Status PENDING  Incomplete     Labs: BNP (last 3 results) No results for input(s): "BNP" in the last 8760 hours. Basic Metabolic Panel: Recent Labs  Lab 10/24/23 0931 10/25/23 0445 10/26/23 0442  NA 131* 137 137  K 3.5 3.3* 3.6  CL 98 104 102  CO2 23 24 26    GLUCOSE 410* 182* 167*  BUN 14 21* 21*  CREATININE 0.69 0.61 0.68  CALCIUM 9.4 9.0 9.0  MG  --  2.0 1.8   Liver Function Tests: Recent Labs  Lab 10/24/23 0931  AST 12*  ALT 17  ALKPHOS 114  BILITOT 0.8  PROT 8.0  ALBUMIN 4.2   No results for input(s): "LIPASE", "AMYLASE" in the last 168 hours. No results for input(s): "AMMONIA" in the last 168 hours. CBC: Recent Labs  Lab 10/24/23 0931 10/25/23 0445 10/26/23 0442  WBC 10.3 8.7 7.6  NEUTROABS 7.1  --   --   HGB 15.9 15.1 15.0  HCT 44.1 43.1 41.7  MCV 82.0 83.0 83.4  PLT 172 155 166   Cardiac Enzymes: No results for input(s): "CKTOTAL", "CKMB", "CKMBINDEX", "TROPONINI" in the last 168 hours. BNP: Invalid input(s): "POCBNP" CBG: Recent Labs  Lab 10/25/23 1109 10/25/23 1629 10/25/23 2007 10/26/23 0729 10/26/23 1120  GLUCAP 237* 300* 222* 211* 207*   D-Dimer No results for input(s): "DDIMER" in the last 72 hours. Hgb A1c Recent Labs    10/24/23 0931  HGBA1C 11.9*   Lipid Profile No results for input(s): "CHOL", "HDL", "LDLCALC", "TRIG", "CHOLHDL", "LDLDIRECT" in the last 72 hours. Thyroid function studies No results for input(s): "TSH", "T4TOTAL", "T3FREE", "THYROIDAB" in the last 72 hours.  Invalid input(s): "FREET3" Anemia work up No results for input(s): "VITAMINB12", "FOLATE", "FERRITIN", "TIBC", "IRON", "RETICCTPCT" in the last 72 hours. Urinalysis    Component Value Date/Time   COLORURINE YELLOW 03/14/2017 2022   APPEARANCEUR CLEAR 03/14/2017 2022   LABSPEC 1.027 03/14/2017 2022   PHURINE 6.0 03/14/2017 2022   GLUCOSEU 50 (A) 03/14/2017 2022   HGBUR NEGATIVE 03/14/2017 2022   BILIRUBINUR NEGATIVE 03/14/2017 2022   KETONESUR 20 (A) 03/14/2017 2022   PROTEINUR 30 (A) 03/14/2017 2022  UROBILINOGEN 1.0 10/28/2009 0030   NITRITE NEGATIVE 03/14/2017 2022   LEUKOCYTESUR NEGATIVE 03/14/2017 2022   Sepsis Labs Recent Labs  Lab 10/24/23 0931 10/25/23 0445 10/26/23 0442  WBC 10.3 8.7 7.6    Microbiology Recent Results (from the past 240 hours)  Blood Cultures x 2 sites     Status: None (Preliminary result)   Collection Time: 10/24/23  9:31 AM   Specimen: BLOOD  Result Value Ref Range Status   Specimen Description BLOOD BLOOD LEFT ARM ac  Final   Special Requests   Final    BOTTLES DRAWN AEROBIC AND ANAEROBIC Blood Culture adequate volume   Culture   Final    NO GROWTH 2 DAYS Performed at [facility], [address], [place], [place] [address]    Report Status PENDING  Incomplete  Blood Cultures x 2 sites     Status: None (Preliminary result)   Collection Time: 10/24/23  9:41 AM   Specimen: BLOOD  Result Value Ref Range Status   Specimen Description BLOOD BLOOD LEFT ARM wrist  Final   Special Requests   Final    BOTTLES DRAWN AEROBIC ONLY Blood Culture results may not be optimal due to an inadequate volume of blood received in culture bottles   Culture   Final    NO GROWTH 2 DAYS Performed at [facility], [address], [place], [place] [address]    Report Status PENDING  Incomplete  Aerobic/Anaerobic Culture w Gram Stain (surgical/deep wound)     Status: None (Preliminary result)   Collection Time: 10/25/23  9:18 AM   Specimen: Wound; Abscess  Result Value Ref Range Status   Specimen Description   Final    WOUND Performed at [facility], [address] [place], [place] [address]    Special Requests   Final    NONE Performed at [facility], [address] [place], [place] [address]    Gram Stain   Final    FEW WBC PRESENT, PREDOMINANTLY PMN FEW GRAM NEGATIVE RODS FEW GRAM POSITIVE COCCI    Culture   Final    FEW GROUP B STREP(S.AGALACTIAE)ISOLATED TESTING AGAINST S. AGALACTIAE NOT ROUTINELY PERFORMED DUE TO PREDICTABILITY OF AMP/PEN/VAN SUSCEPTIBILITY. Performed at [facility], [address] [place], [place] [address]    Report Status PENDING  Incomplete    Time coordinating discharge:  40 mins   SIGNED:  Standley Dakins, MD  [facility] 10/26/2023, 11:56 AM How to contact the [facility] Attending or Consulting provider 7A - 7P or covering provider during after hours 7P -7A, for this patient?  Check the care team in [facility] and look for a) attending/consulting [facility] provider listed and b) the [facility] team listed Log into [email or web address] and use [facility]'s universal password to access. If you do not have the password, please contact the hospital operator. Locate the [facility] provider you are looking for under [facility] and page to a number that you can be directly reached. If you still have difficulty reaching the provider, please page the [facility] (Director on Call) for the Hospitalists listed on amion for assistance.

## 2023-10-26 NOTE — Progress Notes (Signed)
[facility] Progress Note     Subjective: Patient seen and examined.  He is resting comfortably in bed.  He has no acute complaints at this time.  His dressing was changed overnight, as the gauze fell off.  He denies any pain in his toe.  Objective: Vital signs in last 24 hours: Temp:  [98 F (36.7 C)-99.5 F (37.5 C)] 98.2 F (36.8 C) (01/29 0555) Pulse Rate:  [72-78] 72 (01/29 0555) Resp:  [18-20] 18 (01/29 0555) BP: (139-143)/(83-93) 143/92 (01/29 0555) SpO2:  [97 %] 97 % (01/29 0555) Last BM Date : 10/25/23  Intake/Output from previous day: 01/28 0701 - 01/29 0700 In: 700 [IV Piggyback:700] Out: -  Intake/Output this shift: No intake/output data recorded.  General appearance: alert, cooperative, and no distress Pulses: 2+ DP and PT pulses bilaterally Skin: Left great toe with hyperkeratotic changes at the distal tip with lateral wound status post incision and drainage yesterday, pink granulation tissue at the base with a small amount of fibrinous exudate along the plantar aspect of the wound, improving edema, erythema still present; please refer to pictures from yesterday  Lab Results:  Recent Labs    10/25/23 0445 10/26/23 0442  WBC 8.7 7.6  HGB 15.1 15.0  HCT 43.1 41.7  PLT 155 166   BMET Recent Labs    10/25/23 0445 10/26/23 0442  NA 137 137  K 3.3* 3.6  CL 104 102  CO2 24 26  GLUCOSE 182* 167*  BUN 21* 21*  CREATININE 0.61 0.68  CALCIUM 9.0 9.0   PT/INR No results for input(s): "LABPROT", "INR" in the last 72 hours.  Studies/Results: MR FOOT LEFT W WO CONTRAST Result Date: 10/25/2023 CLINICAL DATA:  Soft tissue infection suspected, foot, xray done EXAM: MRI OF THE LEFT FOREFOOT WITHOUT AND WITH CONTRAST TECHNIQUE: Multiplanar, multisequence MR imaging of the left forefoot was performed both before and after administration of intravenous contrast. CONTRAST:  9.34mL GADAVIST GADOBUTROL 1 MMOL/ML IV SOLN COMPARISON:  X-ray 10/24/2023,  MRI 07/29/2022 FINDINGS: Bones/Joint/Cartilage Postsurgical changes from amputations of the second and third toes at the MTP joint level. No acute fracture or dislocation. Faint bone marrow edema within the distal tuft of the great toe distal phalanx without erosion or confluent low T1 marrow signal changes. No additional sites of bone marrow edema within the forefoot. Ligaments Intact Lisfranc ligament.  No collateral ligament injury. Muscles and Tendons Diffuse edema-like signal of the foot musculature which may represent changes related to denervation and/or myositis. Trace FHL tenosynovitis. Soft tissues Soft tissue swelling of the great toe with edema and enhancement compatible with cellulitis. There is a superficial cutaneous blister along the lateral aspect of the great toe the level of the distal phalanx measuring 1.9 x 0.6 x 1.8 cm. Mild subcutaneous edema along the dorsum of the forefoot. IMPRESSION: 1. Faint bone marrow edema within the distal tuft of the great toe distal phalanx without erosion or confluent low T1 marrow signal changes. Appearance favors early acute osteomyelitis. 2. Cellulitis of the great toe. Superficial cutaneous blister along the lateral aspect of the great toe measuring 1.9 x 0.6 x 1.8 cm. 3. Diffuse edema-like signal of the foot musculature which may represent changes related to denervation and/or myositis. 4. Trace FHL tenosynovitis. Electronically Signed   By: Duanne Guess D.O.   On: 10/25/2023 08:34    Anti-infectives: Anti-infectives (From admission, onward)    Start     Dose/Rate Route Frequency Ordered Stop   10/24/23 2300  vancomycin (  VANCOREADY) IVPB 1750 mg/350 mL        1,750 mg 175 mL/hr over 120 Minutes Intravenous 2 times daily 10/24/23 1457     10/24/23 1115  vancomycin (VANCOCIN) IVPB 1000 mg/200 mL premix  Status:  Discontinued        1,000 mg 200 mL/hr over 60 Minutes Intravenous  Once 10/24/23 1106 10/24/23 1109   10/24/23 1115  vancomycin  (VANCOREADY) IVPB 2000 mg/400 mL        2,000 mg 200 mL/hr over 120 Minutes Intravenous  Once 10/24/23 1109 10/24/23 1531       Assessment/Plan:  Patient is a 53 year old male who was admitted with cellulitis to the left great toe.  MRI demonstrated concern for possible early acute osteomyelitis at the very distal tuft of the distal phalanx without erosion.  Status post incision and drainage of left great toe abscess on 1/29.  -Given patient's nontoxic appearance and already missing 2 toes on this foot, will plan for conservative treatment with antibiotics and wound care at this time  -No leukocytosis, WBC 7.6 -Continue IV vancomycin -Gram stain with gram-negative rods and gram-positive cocci, culture with group B strep isolated -Continue local wound care with Xeroform gauze and gauze roll around left great toe.  Will place nursing orders -Recommend patient follow-up outpatient with podiatry for management of wound and evaluation for future need for left great toe amputation if he fails conservative measures -Glycemic control -Care per primary team   LOS: 1 day    Maudean Hoffmann A Keyontae Huckeby 10/26/2023

## 2023-10-26 NOTE — Inpatient Diabetes Management (Signed)
Inpatient Diabetes Program Recommendations  AACE/ADA: New Consensus Statement on Inpatient Glycemic Control (2015)  Target Ranges:  Prepandial:   less than 140 mg/dL      Peak postprandial:   less than 180 mg/dL (1-2 hours)      Critically ill patients:  140 - 180 mg/dL   Lab Results  Component Value Date   GLUCAP 211 (H) 10/26/2023   HGBA1C 11.9 (H) 10/24/2023    Review of Glycemic Control  Latest Reference Range & Units 10/25/23 07:14 10/25/23 11:09 10/25/23 16:29 10/25/23 20:07 10/26/23 07:29  Glucose-Capillary 70 - 99 mg/dL 161 (H)  Novolog 7 units 237 (H)  Novolog 9 units  Semglee 18 units 300 (H)  Novolog 12 units 222 (H)  Novolog 2 units 211 (H)  Novolog 9 units   Diabetes history: Type 2 DM Outpatient Diabetes medications: Basaglar 28-30 units QD Current orders for Inpatient glycemic control:  Novolog 4 units TID Novolog 0-15 units TID & HS Semglee 18 units Daily  A1c 11.9% on 1/27  Inpatient Diabetes Program Recommendations:    -   Increase Semglee to 22 units -   Increase meal coverage to 6 units   Awaiting pharmacy to check on CGM.   Thanks,  Christena Deem RN, MSN, BC-ADM Inpatient Diabetes Coordinator Team Pager [phone] (8a-5p)

## 2023-10-26 NOTE — TOC Transition Note (Signed)
Transition of Care [facility]) - Discharge Note   Patient Details  Name: Albert Mcdonald MRN: [id number] Date of Birth: 01-28-1971  Transition of Care [facility]) CM/SW Contact:  Villa Herb, LCSWA Phone Number: 10/26/2023, 12:16 PM   Clinical Narrative:    CSW updated by MD that pt will need [facility] RN for wound care at D/C. CSW spoke with pt about this, he is agreeable and does not have an agency preference. CSW spoke to [place] with [facility] who accepts [facility] RN referral. MD placed orders. TOC signing off.   Final next level of care: Home w Home Health Services Barriers to Discharge: No Barriers Identified   Patient Goals and CMS Choice            Discharge Placement                       Discharge Plan and Services Additional resources added to the After Visit Summary for                            [facility] Arranged: RN [facility] Agency: [facility] Date [facility] Agency Contacted: 10/26/23   Representative spoke with at [facility] Agency: Kandee Keen  Social Drivers of Health (SDOH) Interventions SDOH Screenings   Food Insecurity: No Food Insecurity (10/24/2023)  Housing: Low Risk  (10/24/2023)  Transportation Needs: No Transportation Needs (10/24/2023)  Utilities: Not At Risk (10/24/2023)  Social Connections: Unknown (02/09/2022)   Received from [facility], [facility]  Tobacco Use: Low Risk  (12/01/2022)  Health Literacy: High Risk (04/13/2021)   Received from [facility], [facility]     Readmission Risk Interventions     No data to display

## 2023-10-26 NOTE — Plan of Care (Signed)
  Problem: Clinical Measurements: Goal: Respiratory complications will improve Outcome: Progressing Goal: Cardiovascular complication will be avoided Outcome: Progressing   Problem: Nutrition: Goal: Adequate nutrition will be maintained Outcome: Progressing   Problem: Coping: Goal: Level of anxiety will decrease Outcome: Progressing   Problem: Elimination: Goal: Will not experience complications related to urinary retention Outcome: Progressing   Problem: Pain Managment: Goal: General experience of comfort will improve and/or be controlled Outcome: Progressing   Problem: Safety: Goal: Ability to remain free from injury will improve Outcome: Progressing

## 2023-10-26 NOTE — Inpatient Diabetes Management (Signed)
Inpatient Diabetes Program Recommendations  AACE/ADA: New Consensus Statement on Inpatient Glycemic Control (2015)  Target Ranges:  Prepandial:   less than 140 mg/dL      Peak postprandial:   less than 180 mg/dL (1-2 hours)      Critically ill patients:  140 - 180 mg/dL   Lab Results  Component Value Date   GLUCAP 207 (H) 10/26/2023   HGBA1C 11.9 (H) 10/24/2023  MD ordered application of Freestyle CGM at discharge for patient. Education done regarding application and changing CGM sensor (alternate every 15 days on back of arms), 1 hour warm-up, use of glucometer when alert displays, how to scan CGM for glucose reading and information for PCP. Patient has also been given educational packet regarding use CGM sensor including the 1-800 toll free number for any questions, problems or needs related to the [facility] sensors or reader. Explained that glucose readings will not be available until 1 hour after application. Reviewed use of CGM including how to scan, changing Sensor, Vitamin C warning, arrows with glucose readings, and Freestyle app. Patient very appreciative.   Patient has a new phone that he wants to transfer everything over to when he gets home, so reviewed with brother in law and patient. Brother in Social worker to help patient set up new phone when arrives home and start [place] 3 plus. Reviewed directions in detail and gave handout with instructions. Patient's mom has a Libre sensor but uses a reader instead of her phone.  Gave 3 sensors for home use.  Thank you, Albert Mcdonald. Kilan Banfill, RN, MSN, CDCES  Diabetes Coordinator Inpatient Glycemic Control Team Team Pager [phone] (8am-5pm) 10/26/2023 2:16 PM

## 2023-10-28 ENCOUNTER — Telehealth: Payer: Self-pay

## 2023-10-28 NOTE — Progress Notes (Signed)
10/28/2023 Name: Albert Mcdonald MRN: [id number] DOB: October 07, 1970  Chief Complaint  Patient presents with   Diabetes   Hypertension   Medication Management    Albert Mcdonald is a 53 y.o. year old male who presented for a telephone visit.   They were referred to the pharmacist by  hospital MD  for assistance in managing diabetes, hypertension, medication access, and complex medication management.    Subjective:  Care Team: Primary Care Provider: Rica Records, FNP ; Next Scheduled Visit: 12/09/23 (Initial Visit)  Medication Access/Adherence  Current Pharmacy:  [facility] [place], [place] - [id number] [place] [address] Phone: [phone] Fax: [phone]   Patient reports affordability concerns with their medications: No  Patient reports access/transportation concerns to their pharmacy: No  Patient reports adherence concerns with their medications:  No     Diabetes:  Current medications: Basaglar 28-30 units in the evening  Medications tried in the past: Glimepiride, Farxiga, Metformin  Current glucose readings: None, is going to his pharmacy today for help attaching his freestyle libre 3+ sensor   Patient denies hypoglycemic s/sx including dizziness, shakiness, sweating. Patient denies hyperglycemic symptoms including polyuria, polydipsia, polyphagia, nocturia, neuropathy, blurred vision.  Current meal patterns:  Working on being more mindful of carbs and no sugary drinks since being home from hospital  Current medication access support: None  Hypertension:  Current medications: Amlodipine 5mg  1 tab daily  Medications previously tried:  Lisinopril, Olmesartan/HCTZ  Patient has a validated, automated, upper arm home BP cuff Current blood pressure readings readings: Has not been using  Patient denies hypotensive s/sx including dizziness, lightheadedness.  Patient denies hypertensive symptoms  including headache, chest pain, shortness of breath   Objective:  Lab Results  Component Value Date   HGBA1C 11.9 (H) 10/24/2023    Lab Results  Component Value Date   CREATININE 0.68 10/26/2023   BUN 21 (H) 10/26/2023   NA 137 10/26/2023   K 3.6 10/26/2023   CL 102 10/26/2023   CO2 26 10/26/2023    Lab Results  Component Value Date   CHOL 196 04/09/2014   HDL 35 (L) 04/09/2014   LDLCALC 99 04/09/2014   TRIG 312 (H) 04/09/2014   CHOLHDL 5.6 (H) 04/09/2014    Medications Reviewed Today     Reviewed by Sherrill Raring, RPH (Pharmacist) on 10/28/23 at 1000  Med List Status: <None>   Medication Order Taking? Sig Documenting Provider Last Dose Status Informant  acetaminophen (TYLENOL) 500 MG tablet [id number] Yes Take 1,000 mg by mouth every 6 (six) hours as needed for moderate pain (pain score 4-6). [provider] Taking Active Self, Pharmacy Records  amLODipine (NORVASC) 5 MG tablet [id number] Yes Take 1 tablet (5 mg total) by mouth daily. Cleora Fleet, MD Taking Active   amoxicillin-clavulanate (AUGMENTIN) 875-125 MG tablet [id number] Yes Take 1 tablet by mouth 2 (two) times daily for 14 days. Cleora Fleet, MD Taking Active   blood glucose meter kit and supplies [id number]  Dispense based on patient and insurance preference. Use up to four times daily as directed. (FOR ICD-10 E10.9, E11.9). Burnadette Pop, MD  Active Self, Pharmacy Records  doxycycline (VIBRAMYCIN) 100 MG capsule [id number] Yes Take 1 capsule (100 mg total) by mouth 2 (two) times daily for 14 days. Cleora Fleet, MD Taking Active   Insulin Glargine [facility] KWIKPEN) 100 UNIT/ML [id number] Yes Inject 28-30 Units into the skin as needed (low blood sugar). [provider] Taking Active Self, Pharmacy Records           Med Note Mayford Knife, DAWN S   Mon Oct 24, 2023 11:08 AM)    Insulin Pen Needle 32G X 4 MM MISC [id number]  1 each by Does not apply route 3 (three) times daily.  Burnadette Pop, MD  Active Self, Pharmacy Records  saccharomyces boulardii [facility]) 250 MG capsule [id number]  Take 1 capsule (250 mg total) by mouth 2 (two) times daily. Cleora Fleet, MD  Active               Assessment/Plan:   Diabetes: - Currently uncontrolled - Reviewed long term cardiovascular and renal outcomes of uncontrolled blood sugar - Reviewed goal A1c, goal fasting, and goal 2 hour post prandial glucose - Reviewed dietary modifications including low carb diet - Recommend to check glucose continuously with sensor provided -Routing to [facility] PharmD to establish care     Hypertension: - Currently uncontrolled - Reviewed long term cardiovascular and renal outcomes of uncontrolled blood pressure - Reviewed appropriate blood pressure monitoring technique and reviewed goal blood pressure. Recommended to check home blood pressure and heart rate 2-3x/week - Recommend to continue current medication therapy.      Follow Up Plan: Routing to [facility] PharmD to establish follow up  Sherrill Raring, PharmD Clinical Pharmacist [phone]

## 2023-10-29 LAB — CULTURE, BLOOD (ROUTINE X 2)
Culture: NO GROWTH
Culture: NO GROWTH
Special Requests: ADEQUATE

## 2023-10-30 LAB — AEROBIC/ANAEROBIC CULTURE W GRAM STAIN (SURGICAL/DEEP WOUND)

## 2023-11-02 ENCOUNTER — Telehealth: Payer: Self-pay

## 2023-11-02 NOTE — Progress Notes (Signed)
 Care Guide Pharmacy Note  11/02/2023 Name: NICHOLAI WILLETTE MRN: [id number] DOB: 08-27-1971  Referred By: Patient, No Pcp Per Reason for referral: Care Coordination (Outreach to schedule with Pharm d )   SYON TEWS is a 53 y.o. year old male who is a primary care patient of Patient, No Pcp Per.  Ozell JONELLE Zehnder was referred to the pharmacist for assistance related to: DMII  Successful contact was made with the patient to discuss pharmacy services including being ready for the pharmacist to call at least 5 minutes before the scheduled appointment time and to have medication bottles and any blood pressure readings ready for review. The patient agreed to meet with the pharmacist via telephone visit on (date/time).11/23/2023  Jeoffrey Buffalo , RMA     [place]  [facility], [facility] Guide  Direct Dial: [phone]  Website: [email or web address]

## 2023-11-04 ENCOUNTER — Ambulatory Visit (INDEPENDENT_AMBULATORY_CARE_PROVIDER_SITE_OTHER): Payer: Medicare Other

## 2023-11-04 ENCOUNTER — Ambulatory Visit (INDEPENDENT_AMBULATORY_CARE_PROVIDER_SITE_OTHER): Payer: Medicare Other | Admitting: Podiatry

## 2023-11-04 DIAGNOSIS — E1142 Type 2 diabetes mellitus with diabetic polyneuropathy: Secondary | ICD-10-CM | POA: Diagnosis not present

## 2023-11-04 DIAGNOSIS — M778 Other enthesopathies, not elsewhere classified: Secondary | ICD-10-CM

## 2023-11-04 DIAGNOSIS — M7752 Other enthesopathy of left foot: Secondary | ICD-10-CM

## 2023-11-04 DIAGNOSIS — L03032 Cellulitis of left toe: Secondary | ICD-10-CM

## 2023-11-04 DIAGNOSIS — L97522 Non-pressure chronic ulcer of other part of left foot with fat layer exposed: Secondary | ICD-10-CM

## 2023-11-04 MED ORDER — AMOXICILLIN-POT CLAVULANATE 875-125 MG PO TABS: 1.0000 | ORAL_TABLET | Freq: Two times a day (BID) | ORAL | 0 refills | Status: AC

## 2023-11-04 NOTE — Progress Notes (Signed)
 Chief Complaint  Patient presents with   Wound Check    HPI: 53 y.o. male presenting today as hospital follow-up for left hallux wound and cellulitis.  Patient has been applying Xeroform to the wound.  He has been on antibiotics.  Last A1c 11.9.  History of prior left foot second and third toe amputation. Per EMR history of polysubstance abuse.  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 ALS   Diabetes mellitus    Family history of anesthesia complication    Hyperlipidemia    Hypertension    Kleine-Levin syndrome    OCD (obsessive compulsive disorder)    Restless leg     Past Surgical History:  Procedure Laterality Date   AMPUTATION Right 02/26/2022   Procedure: AMPUTATION 5th TOE;  Surgeon: Harden Jerona GAILS, MD;  Location: [facility] OR;  Service: Orthopedics;  Laterality: Right;   AMPUTATION TOE Left 07/30/2022   Procedure: AMPUTATION TOE, left third toe;  Surgeon: Kallie Manuelita BROCKS, MD;  Location: [facility] ORS;  Service: General;  Laterality: Left;   AMPUTATION TOE Left 11/01/2022   Procedure: AMPUTATION TOE, LEFT SECOND;  Surgeon: Kallie Manuelita BROCKS, MD;  Location: [facility] ORS;  Service: General;  Laterality: Left;   ANTERIOR CERVICAL DECOMP/DISCECTOMY FUSION  11/09/2012   Procedure: ANTERIOR CERVICAL DECOMPRESSION/DISCECTOMY FUSION 3 LEVELS C4-C7 ;  Surgeon: Donaciano Sprang, MD;  Location: [facility] OR;  Service: Orthopedics;;  ACDF C4-C7     Allergies  Allergen Reactions   Ace Inhibitors Rash    Tolerates lisinopril     Smoker?: non-smoker  ROS denies any nausea, vomiting, fever, chills, chest pain, shortness of breath   PHYSICAL EXAM: There were no vitals filed for this visit.  General: The patient is alert and oriented x3 in no acute distress.  Dermatology: Skin is warm, dry and supple bilateral lower extremities. Interspaces are clear of maceration and debris.      Wound 1:  Location: Left hallux  lateral aspect, IPJ level        Depth: Subcutaneous tissue        Wound Border: Irregular, hyperkeratotic        Wound Base: Fibrogranular        Drainage: Minimal       Odor?:  No malodor        Surrounding Tissue: Normal        Infected?:  Some residual erythema to the toe        Necrosis?:  No        Pain?:  No        Tunneling: No       Dimensions (cm): Postdebridement measurements 2.5 x 0.7 x 0.3 cm  Wound 2:  Location: Plantar left hallux       Depth: Subcutaneous tissue       Wound Border: Epiboly present       Wound Base: Fibrogranular       Drainage: Minimal       Odor?:  No malodor       Surrounding Tissue: Hypertrophic callus       Infected?:  Periwound erythema       Necrosis?:  No       Pain?:  No       Tunneling: No       Dimensions (cm): 0.6 x 0.4 x 0.3 cm       Vascular: Pedal  pulses are palpable 2/4 DP and PT.  Capillary refill time less than 3 seconds to the digits.  Pedal hair growth absent.  Edema present left hallux  Neurological: Light touch sensation grossly diminished.  Protective sensation and vibratory sensation absent  Musculoskeletal Exam: Prior second and third toes amputations left foot     Latest Ref Rng & Units 10/24/2023    9:31 AM 12/01/2022    9:02 AM  Hemoglobin A1C  Hemoglobin-A1c 4.8 - 5.6 % 11.9  10.2      RADIOGRAPHIC EXAM: Left foot 11/04/2023 3 views weightbearing History of second and third toe amputation at MPJ level.  Hammertoe contractures of fourth and fifth digits.  Hallux malleus noted.  No obvious signs of osteolysis or cortical erosions.  No soft tissue emphysema  ASSESSMENT / PLAN OF CARE: 1. Skin ulcer of left great toe with fat layer exposed (HCC)      Meds ordered this encounter  Medications   amoxicillin -clavulanate (AUGMENTIN ) 875-125 MG tablet    Sig: Take 1 tablet by mouth 2 (two) times daily for 14 days.    Dispense:  28 tablet    Refill:  0   VAS US  ABI WITH/WO TBI-ordered to establish baseline due to  A1c 11.9 and presence of diabetic foot ulcer.  Radiographs taken and reviewed with the patient  The ulceration x 2 was sharply debrided of hyperkeratotic and devitalized soft tissue with sterile #312 blade to the level of subcutaneous tissue.  Hemostasis obtained with compression.  Wound cleansed with wound cleanser, Prisma and Hydrofera Blue applied.  Reviewed off-loading with patient.  Surgical shoe dispensed for patient to wear at all times WB.   Reviewed daily dressing changes with patient.  Home dressing supplies ordered including Prisma and Hydrofera Blue versus Aquacel Ag.  Discussed risks / concerns regarding ulcer with patient and possible sequelae if left untreated.  Stressed importance of infection prevention at home. Short-term goals are: prevent infection, off-load ulcer, heal ulcer Long-term goals are:  prevent recurrence, prevent amputation.   Extending course of oral Augmentin  as described above  Return in about 2 weeks (around 11/18/2023) for Wound Care.   Ethan Saddler, DPM, AACFAS [facility]     [address] [place], [place] [address]                Office [phone]  Fax [phone]

## 2023-11-04 NOTE — Patient Instructions (Signed)
 Limited stance and gait to the left foot.  Must wear surgical shoe when on your foot.  Change your dressing every 3 days.  Supplies will be ordered to your home.  Cleanse wound with wound cleanser.  Use Prisma foam collagen to the wound bed.  Cover with Aquacel Ag.  Wrap with gauze and secure in place with tape.  Contact office or go to emergency room if you notice signs of worsening infection including worsening redness, swelling, pain, drainage, foul odor or you begin to feel sick.

## 2023-11-07 ENCOUNTER — Encounter: Payer: Self-pay | Admitting: Podiatry

## 2023-11-09 ENCOUNTER — Ambulatory Visit (HOSPITAL_COMMUNITY)
Admission: RE | Admit: 2023-11-09 | Discharge: 2023-11-09 | Disposition: A | Discharge status: Home / Self Care | Payer: Medicare Other | Source: Ambulatory Visit | Attending: Podiatry | Admitting: Podiatry

## 2023-11-09 DIAGNOSIS — L97522 Non-pressure chronic ulcer of other part of left foot with fat layer exposed: Secondary | ICD-10-CM | POA: Insufficient documentation

## 2023-11-09 LAB — VAS US ABI WITH/WO TBI
Left ABI: 1.28
Right ABI: 1.22

## 2023-11-10 ENCOUNTER — Other Ambulatory Visit: Payer: Self-pay | Admitting: Family Medicine

## 2023-11-16 ENCOUNTER — Other Ambulatory Visit: Payer: Self-pay | Admitting: Family Medicine

## 2023-11-18 ENCOUNTER — Encounter: Payer: Self-pay | Admitting: Podiatry

## 2023-11-18 ENCOUNTER — Ambulatory Visit: Payer: Medicare Other | Admitting: Podiatry

## 2023-11-18 DIAGNOSIS — L97522 Non-pressure chronic ulcer of other part of left foot with fat layer exposed: Secondary | ICD-10-CM | POA: Diagnosis not present

## 2023-11-18 DIAGNOSIS — E1142 Type 2 diabetes mellitus with diabetic polyneuropathy: Secondary | ICD-10-CM

## 2023-11-18 NOTE — Progress Notes (Signed)
 Chief Complaint  Patient presents with   Foot Ulcer    LT 1st. Feeling better, some pressure with walking. He did go have vascular study done. Wound care nurse is changing bandage 1x weekly, he changes it every other day. No drainage. Wearing surgical shoe every day.     HPI: 53 y.o. male presenting for left hallux wound care.  ABIs within normal limits.  Has been doing well with dressing changes.  8 doses left of Augmentin.  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 ALS   Diabetes mellitus    Family history of anesthesia complication    Hyperlipidemia    Hypertension    Kleine-Levin syndrome    OCD (obsessive compulsive disorder)    Restless leg     Past Surgical History:  Procedure Laterality Date   AMPUTATION Right 02/26/2022   Procedure: AMPUTATION 5th TOE;  Surgeon: Nadara Mustard, MD;  Location: [facility] OR;  Service: Orthopedics;  Laterality: Right;   AMPUTATION TOE Left 07/30/2022   Procedure: AMPUTATION TOE, left third toe;  Surgeon: Lucretia Roers, MD;  Location: [facility] ORS;  Service: General;  Laterality: Left;   AMPUTATION TOE Left 11/01/2022   Procedure: AMPUTATION TOE, LEFT SECOND;  Surgeon: Lucretia Roers, MD;  Location: [facility] ORS;  Service: General;  Laterality: Left;   ANTERIOR CERVICAL DECOMP/DISCECTOMY FUSION  11/09/2012   Procedure: ANTERIOR CERVICAL DECOMPRESSION/DISCECTOMY FUSION 3 LEVELS C4-C7 ;  Surgeon: Venita Lick, MD;  Location: [facility] OR;  Service: Orthopedics;;  ACDF C4-C7     Allergies  Allergen Reactions   Ace Inhibitors Rash    Tolerates lisinopril    Smoker?: non-smoker  ROS denies any nausea, vomiting, fever, chills, chest pain, shortness of breath   PHYSICAL EXAM: There were no vitals filed for this visit.  General: The patient is alert and oriented x3 in no acute distress.  Dermatology: Skin is warm, dry and supple bilateral lower extremities.  Interspaces are clear of maceration and debris.  Left hallux erythema appears improved.  Toe nonedematous.  There is diffuse callus present to the left first toe.    Wound 1:  Location: Left hallux lateral aspect, IPJ level        Depth: Subcutaneous tissue        Wound Border: Irregular, hyperkeratotic        Wound Base: Granular        Drainage: Minimal       Odor?:  No malodor        Surrounding Tissue: Diffuse callus to the left hallux        Infected?:  No        Necrosis?:  No        Pain?:  No        Tunneling: No       Dimensions (cm): Predebridement measurements: 1.5 x 0.3 x 0.2 cm.  Postdebridement measurements 1.8 x 0.6 x 0.3 cm  Wound 2:  Location: Plantar left hallux       Depth: Subcutaneous tissue       Wound Border: Hyperkeratotic       Wound Base: Granular       Drainage: Minimal       Odor?:  No malodor       Surrounding Tissue: Hypertrophic callus, diffuse callus left hallux       Infected?:  No  Necrosis?:  No       Pain?:  No       Tunneling: No       Dimensions (cm): Predebridement measurements 0.5 x 0.8 x 0.2 cm, postdebridement measurement 0.6 x 0.9 x 0.3 cm        Vascular: Pedal pulses are palpable 2/4 DP and PT.  Capillary refill time less than 3 seconds to the digits.  Pedal hair growth absent.    Neurological: Light touch sensation grossly diminished.  Protective sensation and vibratory sensation absent  Musculoskeletal Exam: Prior second and third toes amputations left foot     Latest Ref Rng & Units 10/24/2023    9:31 AM 12/01/2022    9:02 AM  Hemoglobin A1C  Hemoglobin-A1c 4.8 - 5.6 % 11.9  10.2      RADIOGRAPHIC EXAM: Left foot 11/04/2023 3 views weightbearing History of second and third toe amputation at MPJ level.  Hammertoe contractures of fourth and fifth digits.  Hallux malleus noted.  No obvious signs of osteolysis or cortical erosions.  No soft tissue emphysema  ASSESSMENT / PLAN OF CARE: 1. Skin ulcer of left great toe with  fat layer exposed (HCC)   2. Diabetic polyneuropathy associated with type 2 diabetes mellitus (HCC)      No orders of the defined types were placed in this encounter.  None  ABI results discussed with patient   The ulceration x 2 was sharply debrided of hyperkeratotic and devitalized soft tissue with sterile #15 blade to the level of subcutaneous tissue.  Hemostasis obtained with compression.  Wound cleansed with wound cleanser, Prisma and Hydrofera Blue applied.  Reviewed off-loading with patient.  Continue protected weightbearing in surgical shoe  Patient states that he does have adequate supplies at this time.  Continue with applications of Hydrofera Blue or Aquacel Ag with Prisma once every 2 to 3 days.  Does have home health coming out once a week.  Discussed risks / concerns regarding ulcer with patient and possible sequelae if left untreated.  Stressed importance of infection prevention at home. Short-term goals are: prevent infection, off-load ulcer, heal ulcer Long-term goals are:  prevent recurrence, prevent amputation.   Complete course of oral Augmentin  Good wound progression noted.  Return in about 2 weeks (around 12/02/2023) for Wound Care.   Bronwen Betters, DPM, AACFAS [facility]     [address] [place], [place] [address]                Office [phone]  Fax [phone]

## 2023-11-22 NOTE — Progress Notes (Unsigned)
   11/22/2023 Name: Albert Mcdonald MRN: [id number] DOB: [birth year]  No chief complaint on file.   Albert Mcdonald is a 53 y.o. year old male who presented for a telephone visit.   They were referred to the pharmacist by a quality report for assistance in managing diabetes and medication access.   Patient stated he is changing to [facility] dual complete Not established yet with PCP--reminded of appt 3/14 May need additional meds Basaglar 10 units increasing to 14 units daily Scheduled f/u   Subjective:  Care Team: Primary Care Provider: Donaciano Eva Next Scheduled Visit: 12/09/23 {careteamprovider:27366}  Medication Access/Adherence  Current Pharmacy:  [facility] [place], [place] - [id number] [place] [address] Phone: [phone] Fax: [phone]   Patient reports affordability concerns with their medications: No  Patient reports access/transportation concerns to their pharmacy: No  Patient reports adherence concerns with their medications:  No     Diabetes:  Current medications:  Medications tried in the past:   Current glucose readings: *** Using *** meter; testing *** times daily  Date of Download: *** % Time CGM is active: ***% Average Glucose: *** mg/dL Glucose Management Indicator: ***  Glucose Variability: *** (goal <36%) Time in Goal:  - Time in range 70-180: ***% - Time above range: ***% - Time below range: ***% Observed patterns:  Patient {Actions; denies-reports:120008} hypoglycemic s/sx including ***dizziness, shakiness, sweating. Patient {Actions; denies-reports:120008} hyperglycemic symptoms including ***polyuria, polydipsia, polyphagia, nocturia, neuropathy, blurred vision.  Current meal patterns:  - Breakfast: *** - Lunch *** - Supper *** - Snacks *** - Drinks ***  Current physical activity: ***  Current medication access support: ***   Objective:  Lab Results  Component Value Date    HGBA1C 11.9 (H) 10/24/2023    Lab Results  Component Value Date   CREATININE 0.68 10/26/2023   BUN 21 (H) 10/26/2023   NA 137 10/26/2023   K 3.6 10/26/2023   CL 102 10/26/2023   CO2 26 10/26/2023    Lab Results  Component Value Date   CHOL 196 04/09/2014   HDL 35 (L) 04/09/2014   LDLCALC 99 04/09/2014   TRIG 312 (H) 04/09/2014   CHOLHDL 5.6 (H) 04/09/2014    Medications Reviewed Today   Medications were not reviewed in this encounter       Assessment/Plan:   {Pharmacy A/P Choices:26421}  Follow Up Plan: ***  ***

## 2023-11-23 ENCOUNTER — Other Ambulatory Visit: Payer: Self-pay | Admitting: Pharmacist

## 2023-11-24 ENCOUNTER — Other Ambulatory Visit: Payer: Self-pay | Admitting: Family Medicine

## 2023-12-02 ENCOUNTER — Ambulatory Visit (INDEPENDENT_AMBULATORY_CARE_PROVIDER_SITE_OTHER): Payer: Medicare Other | Admitting: Podiatry

## 2023-12-02 ENCOUNTER — Encounter: Payer: Self-pay | Admitting: Podiatry

## 2023-12-02 VITALS — Ht 73.0 in | Wt 215.0 lb

## 2023-12-02 DIAGNOSIS — E1142 Type 2 diabetes mellitus with diabetic polyneuropathy: Secondary | ICD-10-CM

## 2023-12-02 DIAGNOSIS — L84 Corns and callosities: Secondary | ICD-10-CM

## 2023-12-02 DIAGNOSIS — L97522 Non-pressure chronic ulcer of other part of left foot with fat layer exposed: Secondary | ICD-10-CM | POA: Diagnosis not present

## 2023-12-02 MED ORDER — AMMONIUM LACTATE 12 % EX LOTN: 1.0000 | TOPICAL_LOTION | CUTANEOUS | 0 refills | Status: AC | PRN

## 2023-12-02 NOTE — Progress Notes (Signed)
 Chief Complaint  Patient presents with   Wound Check    " I am here for a wound check and have not noticed any drainage"     HPI: 53 y.o. male presenting for left hallux wound care.  ABIs within normal limits.  Has been doing well with dressing changes.  Has completed antibiotic course since last visit.  Denies signs and symptoms of infection.  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 ALS   Diabetes mellitus    Family history of anesthesia complication    Hyperlipidemia    Hypertension    Kleine-Levin syndrome    OCD (obsessive compulsive disorder)    Restless leg     Past Surgical History:  Procedure Laterality Date   AMPUTATION Right 02/26/2022   Procedure: AMPUTATION 5th TOE;  Surgeon: Nadara Mustard, MD;  Location: [facility] OR;  Service: Orthopedics;  Laterality: Right;   AMPUTATION TOE Left 07/30/2022   Procedure: AMPUTATION TOE, left third toe;  Surgeon: Lucretia Roers, MD;  Location: [facility] ORS;  Service: General;  Laterality: Left;   AMPUTATION TOE Left 11/01/2022   Procedure: AMPUTATION TOE, LEFT SECOND;  Surgeon: Lucretia Roers, MD;  Location: [facility] ORS;  Service: General;  Laterality: Left;   ANTERIOR CERVICAL DECOMP/DISCECTOMY FUSION  11/09/2012   Procedure: ANTERIOR CERVICAL DECOMPRESSION/DISCECTOMY FUSION 3 LEVELS C4-C7 ;  Surgeon: Venita Lick, MD;  Location: [facility] OR;  Service: Orthopedics;;  ACDF C4-C7     Allergies  Allergen Reactions   Ace Inhibitors Rash    Tolerates lisinopril    Smoker?: non-smoker  ROS denies any nausea, vomiting, fever, chills, chest pain, shortness of breath   PHYSICAL EXAM: There were no vitals filed for this visit.  General: The patient is alert and oriented x3 in no acute distress.  Dermatology: Skin is warm, dry and supple bilateral lower extremities. Interspaces are clear of maceration and debris.  Left hallux erythema appears improved.  Toe  nonedematous.  There is diffuse callus present to the left first toe.    Wound 1:  Location: Left hallux plantar aspect IPJ level        Depth: Subcutaneous tissue        Wound Border: Irregular, hyperkeratotic        Wound Base: Granular        Drainage: Minimal       Odor?:  No malodor        Surrounding Tissue: Diffuse callus to the left hallux        Infected?:  No        Necrosis?:  No        Pain?:  No        Tunneling: No       Dimensions (cm): Predebridement measurements: 0.7 x 0.5 x 0.2 cm.  Postdebridement measurements 1 x 0.8 x 0.3 cm  Left lateral hallux ulcer appears healed following debridement of hemorrhagic callus.  Significant diffuse callus present to the left first toe.    Vascular: Pedal pulses are palpable 2/4 DP and PT.  Capillary refill time less than 3 seconds to the digits.  Pedal hair growth absent.    Neurological: Light touch sensation grossly diminished.  Protective sensation and vibratory sensation absent  Musculoskeletal Exam: Prior second and third toes amputations left foot     Latest Ref Rng & Units 10/24/2023  9:31 AM  Hemoglobin A1C  Hemoglobin-A1c 4.8 - 5.6 % 11.9      RADIOGRAPHIC EXAM: Left foot 11/04/2023 3 views weightbearing History of second and third toe amputation at MPJ level.  Hammertoe contractures of fourth and fifth digits.  Hallux malleus noted.  No obvious signs of osteolysis or cortical erosions.  No soft tissue emphysema  ASSESSMENT / PLAN OF CARE: 1. Diabetic polyneuropathy associated with type 2 diabetes mellitus (HCC)   2. Skin ulcer of left great toe with fat layer exposed (HCC)   3. Pre-ulcerative calluses      Meds ordered this encounter  Medications   ammonium lactate (AMLACTIN DAILY) 12 % lotion    Sig: Apply 1 Application topically as needed for dry skin.    Dispense:  400 g    Refill:  0   None   The ulceration x 1 was sharply debrided of hyperkeratotic and devitalized soft tissue with sterile #15 blade  to the level of subcutaneous tissue.  Hemostasis obtained with compression.  Wound cleansed with wound cleanser, Prisma and antibiotic dressing applied  Reviewed off-loading with patient.  Continue protected weightbearing in surgical shoe  Patient states that he does have adequate supplies at this time.  Continue with applications of Hydrofera Blue or Aquacel Ag with Prisma once every 2 to 3 days.  Does have home health coming out once a week.  Discussed risks / concerns regarding ulcer with patient and possible sequelae if left untreated.  Stressed importance of infection prevention at home. Short-term goals are: prevent infection, off-load ulcer, heal ulcer Long-term goals are:  prevent recurrence, prevent amputation.   Good wound progression noted with lateral ulceration healed.  Ammonium lactate lotion applied for the callus skin, patient can begin applying this at this point.  Return in about 2 weeks (around 12/16/2023).   Bronwen Betters, DPM, AACFAS [facility]     [address] [place], [place] [address]                Office [phone]  Fax [phone]

## 2023-12-02 NOTE — Patient Instructions (Signed)
 Look for urea cream or ointment and apply to the thickened dry skin / calluses. This can be bought over the counter, at a pharmacy or online such as [employer].   Some over-the-counter options include AmLactin or Lac-Hydrin around 10 to 15%.  You can also find 40% urea cream on [employer].  Alternatively you can also scrub the callused tissue with white vinegar and this will help keep the callus soft and manageable.

## 2023-12-06 ENCOUNTER — Telehealth: Payer: Self-pay | Admitting: Podiatry

## 2023-12-09 ENCOUNTER — Encounter: Payer: Self-pay | Admitting: Family Medicine

## 2023-12-09 ENCOUNTER — Ambulatory Visit: Payer: Self-pay | Admitting: Family Medicine

## 2023-12-09 VITALS — BP 135/81 | HR 87 | Resp 16 | Ht 73.0 in | Wt 223.4 lb

## 2023-12-09 DIAGNOSIS — E038 Other specified hypothyroidism: Secondary | ICD-10-CM

## 2023-12-09 DIAGNOSIS — Z1211 Encounter for screening for malignant neoplasm of colon: Secondary | ICD-10-CM

## 2023-12-09 DIAGNOSIS — E119 Type 2 diabetes mellitus without complications: Secondary | ICD-10-CM | POA: Insufficient documentation

## 2023-12-09 DIAGNOSIS — E1169 Type 2 diabetes mellitus with other specified complication: Secondary | ICD-10-CM | POA: Diagnosis not present

## 2023-12-09 DIAGNOSIS — Z1159 Encounter for screening for other viral diseases: Secondary | ICD-10-CM

## 2023-12-09 DIAGNOSIS — E118 Type 2 diabetes mellitus with unspecified complications: Secondary | ICD-10-CM | POA: Insufficient documentation

## 2023-12-09 DIAGNOSIS — I10 Essential (primary) hypertension: Secondary | ICD-10-CM

## 2023-12-09 DIAGNOSIS — E559 Vitamin D deficiency, unspecified: Secondary | ICD-10-CM | POA: Diagnosis not present

## 2023-12-09 DIAGNOSIS — N400 Enlarged prostate without lower urinary tract symptoms: Secondary | ICD-10-CM

## 2023-12-09 DIAGNOSIS — Z794 Long term (current) use of insulin: Secondary | ICD-10-CM

## 2023-12-09 MED ORDER — AMLODIPINE BESYLATE 5 MG PO TABS: 5.0000 mg | ORAL_TABLET | Freq: Every day | ORAL | 2 refills | Status: DC

## 2023-12-09 MED ORDER — BASAGLAR KWIKPEN 100 UNIT/ML ~~LOC~~ SOPN
28.0000 [IU] | PEN_INJECTOR | SUBCUTANEOUS | 3 refills | Status: DC | PRN
Start: 2023-12-09 — End: 2023-12-09

## 2023-12-09 MED ORDER — FREESTYLE LIBRE 2 READER DEVI: 0 refills | Status: AC

## 2023-12-09 MED ORDER — GABAPENTIN 300 MG PO CAPS: 300.0000 mg | ORAL_CAPSULE | Freq: Two times a day (BID) | ORAL | 3 refills | Status: AC

## 2023-12-09 MED ORDER — FREESTYLE LIBRE 2 PLUS SENSOR MISC: 1.0000 | Freq: Every day | 5 refills | Status: DC

## 2023-12-09 MED ORDER — LANTUS SOLOSTAR 100 UNIT/ML ~~LOC~~ SOPN: 10.0000 [IU] | PEN_INJECTOR | Freq: Every day | SUBCUTANEOUS | 0 refills | Status: DC

## 2023-12-09 NOTE — Progress Notes (Signed)
 New Patient Office Visit   Subjective   Patient ID: Albert Mcdonald, male    DOB: 10/03/1970  Age: 53 y.o. MRN: [id number]  CC:  Chief Complaint  Patient presents with   Establish Care   Diabetes    Wants a freestyle libre sensors and reader    HPI Albert Mcdonald 52 year old male, presents to establish care. He  has a past medical history of ALS (amyotrophic lateral sclerosis) (HCC), Diabetes mellitus, Family history of anesthesia complication, Hyperlipidemia, Hypertension, Kleine-Levin syndrome, OCD (obsessive compulsive disorder), and Restless leg.  Diabetes He presents for his initial diabetic visit. He has type 2 diabetes mellitus. His disease course has been fluctuating. Pertinent negatives for hypoglycemia include no speech difficulty or tremors. Associated symptoms include blurred vision and visual change. Pertinent negatives for diabetes include no chest pain and no foot ulcerations. Pertinent negatives for hypoglycemia complications include no blackouts. Diabetic complications include peripheral neuropathy. Risk factors for coronary artery disease include dyslipidemia, hypertension, male sex and obesity. Current diabetic treatment includes insulin injections. He is following a diabetic diet. When asked about meal planning, he reported none. He participates in exercise daily. His breakfast blood glucose range is generally 180-200 mg/dl. An ACE inhibitor/angiotensin II receptor blocker is not being taken. He sees a podiatrist.Eye exam is not current.      Outpatient Encounter Medications as of 12/09/2023  Medication Sig   acetaminophen (TYLENOL) 500 MG tablet Take 1,000 mg by mouth every 6 (six) hours as needed for moderate pain (pain score 4-6).   ammonium lactate (AMLACTIN DAILY) 12 % lotion Apply 1 Application topically as needed for dry skin.   Continuous Glucose Receiver (FREESTYLE LIBRE 2 READER) DEVI Use to check blood sugar daily dx e11.65   Continuous Glucose Sensor  (FREESTYLE LIBRE 2 PLUS SENSOR) MISC 1 each by Does not apply route daily.   gabapentin (NEURONTIN) 300 MG capsule Take 1 capsule (300 mg total) by mouth 2 (two) times daily.   Insulin Pen Needle 32G X 4 MM MISC 1 each by Does not apply route 3 (three) times daily.   [DISCONTINUED] amLODipine (NORVASC) 5 MG tablet Take 1 tablet (5 mg total) by mouth daily.   [DISCONTINUED] Insulin Glargine (BASAGLAR KWIKPEN) 100 UNIT/ML Inject 28-30 Units into the skin as needed (low blood sugar).   amLODipine (NORVASC) 5 MG tablet Take 1 tablet (5 mg total) by mouth daily.   insulin glargine (LANTUS SOLOSTAR) 100 UNIT/ML Solostar Pen Inject 10 Units into the skin at bedtime.   saccharomyces boulardii (FLORASTOR) 250 MG capsule Take 1 capsule (250 mg total) by mouth 2 (two) times daily. (Patient not taking: Reported on 12/09/2023)   [DISCONTINUED] blood glucose meter kit and supplies Dispense based on patient and insurance preference. Use up to four times daily as directed. (FOR ICD-10 E10.9, E11.9).   [DISCONTINUED] Insulin Glargine (BASAGLAR KWIKPEN) 100 UNIT/ML Inject 28-30 Units into the skin as needed (low blood sugar).   No facility-administered encounter medications on file as of 12/09/2023.    Past Surgical History:  Procedure Laterality Date   AMPUTATION Right 02/26/2022   Procedure: AMPUTATION 5th TOE;  Surgeon: Nadara Mustard, MD;  Location: [facility] OR;  Service: Orthopedics;  Laterality: Right;   AMPUTATION TOE Left 07/30/2022   Procedure: AMPUTATION TOE, left third toe;  Surgeon: Lucretia Roers, MD;  Location: [facility];  Service: General;  Laterality: Left;   AMPUTATION TOE Left 11/01/2022   Procedure: AMPUTATION TOE, LEFT SECOND;  Surgeon:  Lucretia Roers, MD;  Location: [facility] ORS;  Service: General;  Laterality: Left;   ANTERIOR CERVICAL DECOMP/DISCECTOMY FUSION  11/09/2012   Procedure: ANTERIOR CERVICAL DECOMPRESSION/DISCECTOMY FUSION 3 LEVELS C4-C7 ;  Surgeon: Venita Lick, MD;  Location: [facility] OR;   Service: Orthopedics;;  ACDF C4-C7     Review of Systems  Constitutional:  Negative for chills and fever.  Eyes:  Positive for blurred vision.  Respiratory:  Negative for shortness of breath.   Cardiovascular:  Negative for chest pain.  Gastrointestinal:  Negative for abdominal pain.  Genitourinary:  Negative for dysuria.  Neurological:  Negative for tremors and speech difficulty.      Objective    BP 135/81   Pulse 87   Resp 16   Ht 6\' 1"  (1.854 m)   Wt 223 lb 6.4 oz (101.3 kg)   SpO2 95%   BMI 29.47 kg/m   Physical Exam Vitals reviewed.  Constitutional:      General: He is not in acute distress.    Appearance: Normal appearance. He is not ill-appearing, toxic-appearing or diaphoretic.  HENT:     Head: Normocephalic.     Right Ear: Tympanic membrane normal.     Left Ear: Tympanic membrane normal.     Mouth/Throat:     Mouth: Mucous membranes are moist.  Eyes:     General:        Right eye: No discharge.        Left eye: No discharge.     Conjunctiva/sclera: Conjunctivae normal.  Cardiovascular:     Rate and Rhythm: Normal rate.     Pulses: Normal pulses.     Heart sounds: Normal heart sounds.  Pulmonary:     Effort: Pulmonary effort is normal. No respiratory distress.     Breath sounds: Normal breath sounds.  Abdominal:     General: Bowel sounds are normal.     Palpations: Abdomen is soft.     Tenderness: There is no abdominal tenderness. There is no right CVA tenderness, left CVA tenderness or guarding.  Musculoskeletal:        General: Normal range of motion.     Cervical back: Normal range of motion.  Skin:    General: Skin is warm and dry.     Capillary Refill: Capillary refill takes less than 2 seconds.  Neurological:     Mental Status: He is alert.     Coordination: Coordination normal.     Gait: Gait normal.  Psychiatric:        Mood and Affect: Mood normal.        Behavior: Behavior normal.       Assessment & Plan:  Type 2 diabetes mellitus  with other specified complication, with long-term current use of insulin (HCC) Assessment & Plan: Initial visit Last Hemoglobin A1c: 11.9  Labs: Ordered today, results pending; will follow up accordingly. The patient reports adhering to prescribed medications: Patient reports Insulin glargine 28-30 units as needed only.(1-3 twice a week) I explained to patient this this not a PRN medication its daily medication Advise to start at 10 units at bedtime daily  Reviewed non-pharmacological interventions, including a balanced diet rich in lean proteins, healthy fats, whole grains, and high-fiber vegetables. Emphasized reducing refined sugars and processed carbohydrates, and incorporating more fruits, leafy greens, and legumes. Education: Patient was educated on recognizing signs and symptoms of both hypoglycemia and hyperglycemia, and advised to seek emergency care if these symptoms occur. Follow-Up: Scheduled for follow-up in 3-4 months,  or sooner if needed. Patient Understanding: The patient verbalized understanding of the care plan, and all questions were answered. Additional Care: Ophthalmology referral was placed. Foot exam results were within normal limits.   Orders: -     Microalbumin / creatinine urine ratio -     Hemoglobin A1c -     Ambulatory referral to Ophthalmology  Need for hepatitis C screening test -     Hepatitis C antibody  Vitamin D deficiency -     VITAMIN D 25 Hydroxy (Vit-D Deficiency, Fractures)  TSH (thyroid-stimulating hormone deficiency) -     TSH + free T4  Primary hypertension -     Lipid panel -     CMP14+EGFR -     CBC with Differential/Platelet  Screening for colon cancer -     Ambulatory referral to Gastroenterology  Benign prostatic hyperplasia, unspecified whether lower urinary tract symptoms present -     PSA  Essential hypertension Assessment & Plan: Continue Amlodipine 5 mg once daily Labs ordered. Discussed with  patient to monitor their  blood pressure regularly and maintain a heart-healthy diet rich in fruits, vegetables, whole grains, and low-fat dairy, while reducing sodium intake to less than 2,300 mg per day. Regular physical activity, such as 30 minutes of moderate exercise most days of the week, will help lower blood pressure and improve overall cardiovascular health. Avoiding smoking, limiting alcohol consumption, and managing stress. Take  prescribed medication, & take it as directed and avoid skipping doses. Seek emergency care if your blood pressure is (over 180/100) or you experience chest pain, shortness of breath, or sudden vision changes.Patient verbalizes understanding regarding plan of care and all questions answered.    Other orders -     FreeStyle Libre 2 Plus Sensor; 1 each by Does not apply route daily.  Dispense: 2 each; Refill: 5 -     FreeStyle Libre 2 Reader; Use to check blood sugar daily dx e11.65  Dispense: 1 each; Refill: 0 -     Gabapentin; Take 1 capsule (300 mg total) by mouth 2 (two) times daily.  Dispense: 60 capsule; Refill: 3 -     amLODIPine Besylate; Take 1 tablet (5 mg total) by mouth daily.  Dispense: 30 tablet; Refill: 2 -     Lantus SoloStar; Inject 10 Units into the skin at bedtime.  Dispense: 15 mL; Refill: 0    Return in about 3 months (around 03/10/2024), or if symptoms worsen or fail to improve, for type 2 diabetes, hypertension.   Cruzita Lederer Newman Nip, FNP

## 2023-12-09 NOTE — Assessment & Plan Note (Addendum)
 Initial visit Last Hemoglobin A1c: 11.9  Labs: Ordered today, results pending; will follow up accordingly. The patient reports adhering to prescribed medications: Patient reports Insulin glargine 28-30 units as needed only.(1-3 twice a week) I explained to patient this this not a PRN medication its daily medication Advise to start at 10 units at bedtime daily  Reviewed non-pharmacological interventions, including a balanced diet rich in lean proteins, healthy fats, whole grains, and high-fiber vegetables. Emphasized reducing refined sugars and processed carbohydrates, and incorporating more fruits, leafy greens, and legumes. Education: Patient was educated on recognizing signs and symptoms of both hypoglycemia and hyperglycemia, and advised to seek emergency care if these symptoms occur. Follow-Up: Scheduled for follow-up in 3-4 months, or sooner if needed. Patient Understanding: The patient verbalized understanding of the care plan, and all questions were answered. Additional Care: Ophthalmology referral was placed. Foot exam results were within normal limits.

## 2023-12-09 NOTE — Assessment & Plan Note (Signed)
 Continue Amlodipine 5 mg once daily Labs ordered. Discussed with  patient to monitor their blood pressure regularly and maintain a heart-healthy diet rich in fruits, vegetables, whole grains, and low-fat dairy, while reducing sodium intake to less than 2,300 mg per day. Regular physical activity, such as 30 minutes of moderate exercise most days of the week, will help lower blood pressure and improve overall cardiovascular health. Avoiding smoking, limiting alcohol consumption, and managing stress. Take  prescribed medication, & take it as directed and avoid skipping doses. Seek emergency care if your blood pressure is (over 180/100) or you experience chest pain, shortness of breath, or sudden vision changes.Patient verbalizes understanding regarding plan of care and all questions answered.

## 2023-12-09 NOTE — Assessment & Plan Note (Signed)
" >>  ASSESSMENT AND PLAN FOR TYPE 2 DIABETES MELLITUS (HCC) WRITTEN ON 12/09/2023  3:57 PM BY DEL ORBE POLANCO, Aicha Clingenpeel, FNP  Initial visit Last Hemoglobin A1c: 11.9  Labs: Ordered today, results pending; will follow up accordingly. The patient reports adhering to prescribed medications: Patient reports Insulin  glargine 28-30 units as needed only.(1-3 twice a week) I explained to patient this this not a PRN medication its daily medication Advise to start at 10 units at bedtime daily  Reviewed non-pharmacological interventions, including a balanced diet rich in lean proteins, healthy fats, whole grains, and high-fiber vegetables. Emphasized reducing refined sugars and processed carbohydrates, and incorporating more fruits, leafy greens, and legumes. Education: Patient was educated on recognizing signs and symptoms of both hypoglycemia and hyperglycemia, and advised to seek emergency care if these symptoms occur. Follow-Up: Scheduled for follow-up in 3-4 months, or sooner if needed. Patient Understanding: The patient verbalized understanding of the care plan, and all questions were answered. Additional Care: Ophthalmology referral was placed. Foot exam results were within normal limits.  "

## 2023-12-09 NOTE — Patient Instructions (Addendum)
        Great to see you today.  I have refilled the medication(s) we provide.    Check Blood Glucose 2-3 times daily   Fasting Blood Glucose (Before Meals):   Target for Diabetes Management: 80-130     Postprandial Blood Glucose (1-2 Hours After Meals):   Target for Diabetes Management: Less than 180     If your blood glucose levels are consistently higher than goal target range Increase Lantus Injection to 20 units at bedtime     If labs were collected, we will inform you of lab results once received either by echart message or telephone call.   - echart message- for normal results that have been seen by the patient already.   - telephone call: abnormal results or if patient has not viewed results in their echart.   - Please take medications as prescribed. - Follow up with your primary health provider if any health concerns arises. - If symptoms worsen please contact your primary care provider and/or visit the emergency department.

## 2023-12-10 LAB — PSA: Prostate Specific Ag, Serum: 0.3 ng/mL (ref 0.0–4.0)

## 2023-12-11 LAB — MICROALBUMIN / CREATININE URINE RATIO
Creatinine, Urine: 43.7 mg/dL
Microalb/Creat Ratio: 30 mg/g{creat} — ABNORMAL HIGH (ref 0–29)
Microalbumin, Urine: 13.1 ug/mL

## 2023-12-11 LAB — HEMOGLOBIN A1C
Est. average glucose Bld gHb Est-mCnc: 292 mg/dL
Hgb A1c MFr Bld: 11.8 % — ABNORMAL HIGH (ref 4.8–5.6)

## 2023-12-11 LAB — CBC WITH DIFFERENTIAL/PLATELET
Basophils Absolute: 0.1 10*3/uL (ref 0.0–0.2)
Basos: 1 %
EOS (ABSOLUTE): 0.1 10*3/uL (ref 0.0–0.4)
Eos: 1 %
Hematocrit: 47.3 % (ref 37.5–51.0)
Hemoglobin: 15.9 g/dL (ref 13.0–17.7)
Immature Grans (Abs): 0 10*3/uL (ref 0.0–0.1)
Immature Granulocytes: 0 %
Lymphocytes Absolute: 2.3 10*3/uL (ref 0.7–3.1)
Lymphs: 30 %
MCH: 28.6 pg (ref 26.6–33.0)
MCHC: 33.6 g/dL (ref 31.5–35.7)
MCV: 85 fL (ref 79–97)
Monocytes Absolute: 0.5 10*3/uL (ref 0.1–0.9)
Monocytes: 7 %
Neutrophils Absolute: 4.6 10*3/uL (ref 1.4–7.0)
Neutrophils: 61 %
Platelets: 179 10*3/uL (ref 150–450)
RBC: 5.56 x10E6/uL (ref 4.14–5.80)
RDW: 12.4 % (ref 11.6–15.4)
WBC: 7.5 10*3/uL (ref 3.4–10.8)

## 2023-12-11 LAB — CMP14+EGFR
ALT: 19 IU/L (ref 0–44)
AST: 15 IU/L (ref 0–40)
Albumin: 4.7 g/dL (ref 3.8–4.9)
Alkaline Phosphatase: 148 IU/L — ABNORMAL HIGH (ref 44–121)
BUN/Creatinine Ratio: 16 (ref 9–20)
BUN: 12 mg/dL (ref 6–24)
Bilirubin Total: 0.3 mg/dL (ref 0.0–1.2)
CO2: 23 mmol/L (ref 20–29)
Calcium: 9.6 mg/dL (ref 8.7–10.2)
Chloride: 100 mmol/L (ref 96–106)
Creatinine, Ser: 0.75 mg/dL — ABNORMAL LOW (ref 0.76–1.27)
Globulin, Total: 2.5 g/dL (ref 1.5–4.5)
Glucose: 319 mg/dL — ABNORMAL HIGH (ref 70–99)
Potassium: 4.3 mmol/L (ref 3.5–5.2)
Sodium: 140 mmol/L (ref 134–144)
Total Protein: 7.2 g/dL (ref 6.0–8.5)
eGFR: 109 mL/min/{1.73_m2} (ref >59)

## 2023-12-11 LAB — LIPID PANEL
Chol/HDL Ratio: 5.3 ratio — ABNORMAL HIGH (ref 0.0–5.0)
Cholesterol, Total: 222 mg/dL — ABNORMAL HIGH (ref 100–199)
HDL: 42 mg/dL (ref >39)
LDL Chol Calc (NIH): 128 mg/dL — ABNORMAL HIGH (ref 0–99)
Triglycerides: 295 mg/dL — ABNORMAL HIGH (ref 0–149)
VLDL Cholesterol Cal: 52 mg/dL — ABNORMAL HIGH (ref 5–40)

## 2023-12-11 LAB — HEPATITIS C ANTIBODY: Hep C Virus Ab: NONREACTIVE

## 2023-12-11 LAB — TSH+FREE T4
Free T4: 1.25 ng/dL (ref 0.82–1.77)
TSH: 1.45 u[IU]/mL (ref 0.450–4.500)

## 2023-12-11 LAB — VITAMIN D 25 HYDROXY (VIT D DEFICIENCY, FRACTURES): Vit D, 25-Hydroxy: 12.1 ng/mL — ABNORMAL LOW (ref 30.0–100.0)

## 2023-12-14 ENCOUNTER — Other Ambulatory Visit: Payer: Self-pay | Admitting: Family Medicine

## 2023-12-14 DIAGNOSIS — E1165 Type 2 diabetes mellitus with hyperglycemia: Secondary | ICD-10-CM

## 2023-12-14 MED ORDER — GLIPIZIDE 10 MG PO TABS: 10.0000 mg | ORAL_TABLET | Freq: Two times a day (BID) | ORAL | 3 refills | Status: DC

## 2023-12-14 MED ORDER — OZEMPIC (0.25 OR 0.5 MG/DOSE) 2 MG/3ML ~~LOC~~ SOPN
0.2500 mg | PEN_INJECTOR | SUBCUTANEOUS | 0 refills | Status: DC
Start: 2023-12-14 — End: 2023-12-23

## 2023-12-14 MED ORDER — ROSUVASTATIN CALCIUM 40 MG PO TABS: 40.0000 mg | ORAL_TABLET | Freq: Every day | ORAL | 3 refills | Status: AC

## 2023-12-14 NOTE — Progress Notes (Signed)
 Please inform patient,   Hemoglobin A1c 11.8  type 2 diabetes not controlled  Plan: Start Glipizide 10 mg twice daily with meals Weekly Ozempic injections initial dose 0.25 mg, increase dosage will be monthly Continue with daily Bedtime injection 10 units Lantus injection Referral placed to endocrinology It is important to follow a DASH diet which includes vegetables,fruits,whole grains, fat free or low fat diary,fish,poultry,beans,nuts and seeds,vegetable oils. Find an activity that you will enjoyandstart to be active at least 5 days a week for 30 minutes each day.       Cholesterol levels elevated, start lifestyle modifications and follow diet low in saturated fat. Plan Crestor 40 mg once daily- medication sent to pharmacy.  Diet to Lower Cholesterol Eat More: Oats, beans, and lentils: High in soluble fiber. Fatty fish: Salmon, tuna (rich in omega-3s). Nuts and seeds: Almonds, walnuts, flaxseeds. Fruits and vegetables: Apples, berries, leafy greens. Healthy fats: Olive oil, avocado. Limit: Saturated fats: Butter, cream, fatty meats. Trans fats: Fried foods, processed snacks. Sugar and refined carbs: Sweets, white bread. Focus on whole foods, healthy fats, and fiber to improve heart health! Maintain an exercise routine 3 to 5 days a week for a minimum total of 150 minutes.     Vitamin D levels low, I advise to taking  over the counter supplements of vitamin D 1000 IU/day to prevent low vitamin D levels.  Consume Vitamin D-Rich Foods: Fatty Fish: Salmon, mackerel, tuna, and sardines. Egg Yolks: A good source if eaten whole. Fortified Foods: Milk, orange juice, cereals, and plant-based milks (like almond or soy milk). Mushrooms: Especially those exposed to sunlight or UV light. Cod Liver Oil: A concentrated source of vitamin D. Including these foods in your diet can help boost vitamin D levels   Symptoms of Low Vitamin D:  Fatigue and low energy. Bone pain and muscle  weakness. Increased risk of fractures or weak bones. Frequent illness or infections. Mood changes, like depression or anxiety. Hair loss or thinning.

## 2023-12-16 ENCOUNTER — Encounter: Payer: Self-pay | Admitting: Podiatry

## 2023-12-16 ENCOUNTER — Ambulatory Visit (INDEPENDENT_AMBULATORY_CARE_PROVIDER_SITE_OTHER): Admitting: Podiatry

## 2023-12-16 DIAGNOSIS — L97522 Non-pressure chronic ulcer of other part of left foot with fat layer exposed: Secondary | ICD-10-CM | POA: Diagnosis not present

## 2023-12-16 DIAGNOSIS — E1142 Type 2 diabetes mellitus with diabetic polyneuropathy: Secondary | ICD-10-CM | POA: Diagnosis not present

## 2023-12-16 NOTE — Progress Notes (Signed)
 Chief Complaint  Patient presents with   Follow-up    Patient states everything has been ok since last visit, no pain just some redness around left great hallux    HPI: 53 y.o. male presenting for left hallux wound care.  ABIs within normal limits.  Has been doing well with dressing changes.  Denies any signs and symptoms of infection.  States that he has been on his feet more and is presenting in regular shoes today.  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 ALS   Diabetes mellitus    Family history of anesthesia complication    Hyperlipidemia    Hypertension    Kleine-Levin syndrome    OCD (obsessive compulsive disorder)    Restless leg     Past Surgical History:  Procedure Laterality Date   AMPUTATION Right 02/26/2022   Procedure: AMPUTATION 5th TOE;  Surgeon: Nadara Mustard, MD;  Location: [facility] OR;  Service: Orthopedics;  Laterality: Right;   AMPUTATION TOE Left 07/30/2022   Procedure: AMPUTATION TOE, left third toe;  Surgeon: Lucretia Roers, MD;  Location: [facility] ORS;  Service: General;  Laterality: Left;   AMPUTATION TOE Left 11/01/2022   Procedure: AMPUTATION TOE, LEFT SECOND;  Surgeon: Lucretia Roers, MD;  Location: [facility] ORS;  Service: General;  Laterality: Left;   ANTERIOR CERVICAL DECOMP/DISCECTOMY FUSION  11/09/2012   Procedure: ANTERIOR CERVICAL DECOMPRESSION/DISCECTOMY FUSION 3 LEVELS C4-C7 ;  Surgeon: Venita Lick, MD;  Location: [facility] OR;  Service: Orthopedics;;  ACDF C4-C7     Allergies  Allergen Reactions   Ace Inhibitors Rash    Tolerates lisinopril    Smoker?: non-smoker  ROS denies any nausea, vomiting, fever, chills, chest pain, shortness of breath   PHYSICAL EXAM: There were no vitals filed for this visit.  General: The patient is alert and oriented x3 in no acute distress.  Dermatology: Skin is warm, dry and supple bilateral lower extremities. Interspaces are  clear of maceration and debris.  Diffuse callus present left first toe    Wound 1:  Location: Left hallux plantar aspect IPJ level        Depth: Subcutaneous tissue        Wound Border: Irregular, hyperkeratotic        Wound Base: Granular        Drainage: Minimal       Odor?:  No malodor        Surrounding Tissue: Diffuse callus to the left hallux        Infected?:  No        Necrosis?:  No        Pain?:  No        Tunneling: No       Dimensions (cm): Predebridement measurements: Hemorrhagic callus measuring 0.5 cm in diameter.  Postdebridement measurements 0.8 x 0.5 x 0.3 cm with healthy bleeding base.     Vascular: Pedal pulses are palpable 2/4 DP and PT.  Capillary refill time less than 3 seconds to the digits.  Pedal hair growth absent.    Neurological: Light touch sensation grossly diminished.  Protective sensation and vibratory sensation absent  Musculoskeletal Exam: Prior second and third toes amputations left foot     Latest Ref Rng & Units 12/09/2023    3:02 PM 10/24/2023    9:31 AM  Hemoglobin A1C  Hemoglobin-A1c 4.8 - 5.6 %  11.8  11.9      RADIOGRAPHIC EXAM: Left foot 11/04/2023 3 views weightbearing History of second and third toe amputation at MPJ level.  Hammertoe contractures of fourth and fifth digits.  Hallux malleus noted.  No obvious signs of osteolysis or cortical erosions.  No soft tissue emphysema  ASSESSMENT / PLAN OF CARE: 1. Diabetic polyneuropathy associated with type 2 diabetes mellitus (HCC)   2. Skin ulcer of left great toe with fat layer exposed (HCC)      No orders of the defined types were placed in this encounter.  None   The ulceration x 1 was sharply debrided of hyperkeratotic and devitalized soft tissue with sterile #15 blade to the level of subcutaneous tissue.  Hemostasis obtained with compression.  Wound cleansed with wound cleanser, Silvadene cream and Coban bandage applied.  Reviewed off-loading with patient.  Instructed patient  that he needs to be compliant with use of surgical shoe and practice limited stance and gait in order to get the wound to adequately heal.  Patient states that he does have adequate supplies at this time.  Continue with applications of Hydrofera Blue or Aquacel Ag with Prisma once every 2 to 3 days.  Does have home health coming out once a week.  Discussed risks / concerns regarding ulcer with patient and possible sequelae if left untreated.  Stressed importance of infection prevention at home. Short-term goals are: prevent infection, off-load ulcer, heal ulcer Long-term goals are:  prevent recurrence, prevent amputation.   Good wound progression noted with lateral ulceration healed.  Ammonium lactate lotion ordered for the callus skin, patient can begin applying this at this point.  Return in about 2 weeks (around 12/30/2023).   Bronwen Betters, DPM, AACFAS [facility]     [address] [place], [place] [address]                Office [phone]  Fax [phone]

## 2023-12-20 ENCOUNTER — Encounter: Payer: Self-pay | Admitting: *Deleted

## 2023-12-21 ENCOUNTER — Other Ambulatory Visit: Payer: Self-pay | Admitting: Pharmacist

## 2023-12-22 NOTE — Patient Instructions (Signed)

## 2023-12-23 ENCOUNTER — Ambulatory Visit (INDEPENDENT_AMBULATORY_CARE_PROVIDER_SITE_OTHER): Admitting: Nurse Practitioner

## 2023-12-23 ENCOUNTER — Encounter: Payer: Self-pay | Admitting: Nurse Practitioner

## 2023-12-23 ENCOUNTER — Telehealth: Payer: Self-pay

## 2023-12-23 VITALS — BP 128/84 | HR 72 | Ht 73.0 in | Wt 231.4 lb

## 2023-12-23 DIAGNOSIS — I10 Essential (primary) hypertension: Secondary | ICD-10-CM

## 2023-12-23 DIAGNOSIS — E118 Type 2 diabetes mellitus with unspecified complications: Secondary | ICD-10-CM | POA: Diagnosis not present

## 2023-12-23 DIAGNOSIS — Z7984 Long term (current) use of oral hypoglycemic drugs: Secondary | ICD-10-CM

## 2023-12-23 DIAGNOSIS — Z794 Long term (current) use of insulin: Secondary | ICD-10-CM | POA: Diagnosis not present

## 2023-12-23 DIAGNOSIS — E782 Mixed hyperlipidemia: Secondary | ICD-10-CM

## 2023-12-23 DIAGNOSIS — E559 Vitamin D deficiency, unspecified: Secondary | ICD-10-CM

## 2023-12-23 DIAGNOSIS — Z7985 Long-term (current) use of injectable non-insulin antidiabetic drugs: Secondary | ICD-10-CM | POA: Diagnosis not present

## 2023-12-23 MED ORDER — INSULIN PEN NEEDLE 32G X 4 MM MISC: 1 refills | Status: DC

## 2023-12-23 MED ORDER — LANTUS SOLOSTAR 100 UNIT/ML ~~LOC~~ SOPN
20.0000 [IU] | PEN_INJECTOR | Freq: Every day | SUBCUTANEOUS | 3 refills | Status: DC
Start: 2023-12-23 — End: 2024-03-22

## 2023-12-23 MED ORDER — VITAMIN D (ERGOCALCIFEROL) 1.25 MG (50000 UNIT) PO CAPS: 50000.0000 [IU] | ORAL_CAPSULE | ORAL | 1 refills | Status: DC

## 2023-12-23 NOTE — Telephone Encounter (Signed)
 Copied from CRM [id number]. Topic: Clinical - Medical Advice >> Dec 21, 2023  3:02 PM Carlatta H wrote: Reason for CRM: Jill Side called to advised Ilianna of patient Blood Sugar average of 250 - 350//Patient is running high all the time with monitor//Please Call Colleen [phone]//

## 2023-12-23 NOTE — Telephone Encounter (Signed)
 Copied from CRM [id number]. Topic: Appointments - Appointment Info/Confirmation >> Dec 21, 2023  3:30 PM Morrie Sheldon D wrote: Patient/patient representative is calling for information regarding an appointment.  Patient called stating he missed a phone call from the office and I asked him it he had an appointment at 1pm with patient outreach. Patient stated he missed it and he would like to reschedule his appointment  [phone]

## 2023-12-23 NOTE — Telephone Encounter (Signed)
Patient needs to follow up with endocrinology

## 2023-12-23 NOTE — Progress Notes (Addendum)
 Endocrinology Follow Up Note       12/23/2023, 8:29 AM   Subjective:    Patient ID: Albert Mcdonald, male    DOB: Dec 30, 1970.  Albert Mcdonald is being seen in follow up after being seen in consultation for management of currently uncontrolled symptomatic diabetes requested by  Del Nigel Berthold, FNP.   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 ALS   Diabetes mellitus    Family history of anesthesia complication    Hyperlipidemia    Hypertension    Kleine-Levin syndrome    OCD (obsessive compulsive disorder)    Restless leg     Past Surgical History:  Procedure Laterality Date   AMPUTATION Right 02/26/2022   Procedure: AMPUTATION 5th TOE;  Surgeon: Nadara Mustard, MD;  Location: [facility] OR;  Service: Orthopedics;  Laterality: Right;   AMPUTATION TOE Left 07/30/2022   Procedure: AMPUTATION TOE, left third toe;  Surgeon: Lucretia Roers, MD;  Location: [facility]S;  Service: General;  Laterality: Left;   AMPUTATION TOE Left 11/01/2022   Procedure: AMPUTATION TOE, LEFT SECOND;  Surgeon: Lucretia Roers, MD;  Location: [facility] ORS;  Service: General;  Laterality: Left;   ANTERIOR CERVICAL DECOMP/DISCECTOMY FUSION  11/09/2012   Procedure: ANTERIOR CERVICAL DECOMPRESSION/DISCECTOMY FUSION 3 LEVELS C4-C7 ;  Surgeon: Venita Lick, MD;  Location: [facility] OR;  Service: Orthopedics;;  ACDF C4-C7     Social History   Socioeconomic History   Marital status: Single    Spouse name: Not on file   Number of children: Not on file   Years of education: Not on file   Highest education level: Not on file  Occupational History   Not on file  Tobacco Use   Smoking status: Never   Smokeless tobacco: Never  Vaping Use   Vaping status: Never Used  Substance and Sexual Activity   Alcohol use: Not Currently   Drug use: No   Sexual activity: Not  on file  Other Topics Concern   Not on file  Social History Narrative   Not on file   Social Drivers of Health   Financial Resource Strain: Not on file  Food Insecurity: No Food Insecurity (10/24/2023)   Hunger Vital Sign    Worried About Running Out of Food in the Last Year: Never true    Ran Out of Food in the Last Year: Never true  Transportation Needs: No Transportation Needs (10/24/2023)   PRAPARE - Transportation    Lack of Transportation (Medical): No    Lack of Transportation (Non-Medical): No  Physical Activity: Not on file  Stress: Not on file  Social Connections: Unknown (02/09/2022)   Received from [facility]   Social Network    Social Network: Not on file    Family History  Problem Relation Age of Onset   Diabetes Mother    Cancer - Colon Neg Hx    Colon polyps Neg Hx     Outpatient Encounter Medications as of 12/23/2023  Medication Sig   acetaminophen (TYLENOL) 500 MG tablet Take 1,000 mg by mouth  every 6 (six) hours as needed for moderate pain (pain score 4-6).   amLODipine (NORVASC) 5 MG tablet Take 1 tablet (5 mg total) by mouth daily.   Continuous Glucose Receiver (FREESTYLE LIBRE 2 READER) DEVI Use to check blood sugar daily dx e11.65   Continuous Glucose Sensor (FREESTYLE LIBRE 2 PLUS SENSOR) MISC 1 each by Does not apply route daily.   gabapentin (NEURONTIN) 300 MG capsule Take 1 capsule (300 mg total) by mouth 2 (two) times daily.   Vitamin D, Ergocalciferol, (DRISDOL) 1.25 MG (50000 UNIT) CAPS capsule Take 1 capsule (50,000 Units total) by mouth every 7 (seven) days.   [DISCONTINUED] insulin glargine (LANTUS SOLOSTAR) 100 UNIT/ML Solostar Pen Inject 10 Units into the skin at bedtime.   ammonium lactate (AMLACTIN DAILY) 12 % lotion Apply 1 Application topically as needed for dry skin. (Patient not taking: Reported on 12/23/2023)   insulin glargine (LANTUS SOLOSTAR) 100 UNIT/ML Solostar Pen Inject 20 Units into the skin at bedtime.    Insulin Pen Needle 32G X 4 MM MISC Use to inject insulin once daily   rosuvastatin (CRESTOR) 40 MG tablet Take 1 tablet (40 mg total) by mouth daily. (Patient not taking: Reported on 12/23/2023)   [DISCONTINUED] glipiZIDE (GLUCOTROL) 10 MG tablet Take 1 tablet (10 mg total) by mouth 2 (two) times daily before a meal. (Patient not taking: Reported on 12/23/2023)   [DISCONTINUED] Insulin Pen Needle 32G X 4 MM MISC 1 each by Does not apply route 3 (three) times daily. (Patient not taking: Reported on 12/23/2023)   [DISCONTINUED] saccharomyces boulardii (FLORASTOR) 250 MG capsule Take 1 capsule (250 mg total) by mouth 2 (two) times daily. (Patient not taking: Reported on 12/23/2023)   [DISCONTINUED] Semaglutide,0.25 or 0.5MG /DOS, (OZEMPIC, 0.25 OR 0.5 MG/DOSE,) 2 MG/3ML SOPN Inject 0.25 mg into the skin once a week. (Patient not taking: Reported on 12/23/2023)   No facility-administered encounter medications on file as of 12/23/2023.    ALLERGIES: Allergies  Allergen Reactions   Ace Inhibitors Rash    Tolerates lisinopril    VACCINATION STATUS: Immunization History  Administered Date(s) Administered   Influenza,inj,Quad PF,6+ Mos 07/08/2017   Moderna Sars-Covid-2 Vaccination 01/17/2020   Pneumococcal Polysaccharide-23 11/10/2012   Tdap 03/09/2014, 07/09/2017    Diabetes He presents for his follow-up diabetic visit. He has type 2 diabetes mellitus. Onset time: Diagnosed at approx age of 79. His disease course has been improving. Hypoglycemia symptoms include nervousness/anxiousness, sweats and tremors. Associated symptoms include blurred vision, foot paresthesias, foot ulcerations and polyuria. There are no hypoglycemic complications. Symptoms are stable. Diabetic complications include PVD (recent toe amputation). Risk factors for coronary artery disease include diabetes mellitus, male sex and hypertension. Current diabetic treatment includes insulin injections. He is compliant with treatment most of  the time. His weight is stable. He is following a generally unhealthy diet. When asked about meal planning, he reported none. He has not had a previous visit with a dietitian. He participates in exercise three times a week. His overall blood glucose range is >200 mg/dl. (He presents today after long absence with his CGM showing above target glycemic profile overall.  His most recent A1c on 3/14 was 11.8%.  Analysis of his CGM shows TIR 13%, TAR 87%, TBR 0% with a GMI of 9.6%.  He is really liking his CGM, says he is learning which foods are causing higher spikes so he can avoid them.  ) An ACE inhibitor/angiotensin II receptor blocker is not being taken. He sees a podiatrist.Eye  exam is not current.    Review of systems  Constitutional: + increasing body weight, current Body mass index is 30.53 kg/m., no fatigue, no subjective hyperthermia, no subjective hypothermia Eyes: no blurry vision, no xerophthalmia ENT: no sore throat, no nodules palpated in throat, no dysphagia/odynophagia, no hoarseness Cardiovascular: no chest pain, no shortness of breath, no palpitations, no leg swelling Respiratory: no cough, no shortness of breath Gastrointestinal: no nausea/vomiting/diarrhea Musculoskeletal: no muscle/joint aches Skin: no rashes, no hyperemia Neurological: no tremors, no numbness, no tingling, no dizziness Psychiatric: no depression, no anxiety  Objective:     BP 128/84 (BP Location: Left Arm, Patient Position: Sitting, Cuff Size: Large)   Pulse 72   Ht 6\' 1"  (1.854 m)   Wt 231 lb 6.4 oz (105 kg)   BMI 30.53 kg/m   Wt Readings from Last 3 Encounters:  12/23/23 231 lb 6.4 oz (105 kg)  12/09/23 223 lb 6.4 oz (101.3 kg)  12/02/23 215 lb (97.5 kg)     BP Readings from Last 3 Encounters:  12/23/23 128/84  12/09/23 135/81  10/26/23 (!) 143/92      Physical Exam- Limited  Constitutional:  Body mass index is 30.53 kg/m. , not in acute distress, distracted state of mind (says he has a  learning disability) Eyes:  EOMI, no exophthalmos Musculoskeletal: no gross deformities, strength intact in all four extremities, no gross restriction of joint movements Skin:  no rashes, no hyperemia Neurological: no tremor with outstretched hands    CMP ( most recent) CMP     Component Value Date/Time   NA 140 12/09/2023 1502   K 4.3 12/09/2023 1502   CL 100 12/09/2023 1502   CO2 23 12/09/2023 1502   GLUCOSE 319 (H) 12/09/2023 1502   GLUCOSE 167 (H) 10/26/2023 0442   BUN 12 12/09/2023 1502   CREATININE 0.75 (L) 12/09/2023 1502   CALCIUM 9.6 12/09/2023 1502   PROT 7.2 12/09/2023 1502   ALBUMIN 4.7 12/09/2023 1502   AST 15 12/09/2023 1502   ALT 19 12/09/2023 1502   ALKPHOS 148 (H) 12/09/2023 1502   BILITOT 0.3 12/09/2023 1502   GFRNONAA >60 10/26/2023 0442   GFRAA >60 06/22/2019 1025     Diabetic Labs (most recent): Lab Results  Component Value Date   HGBA1C 11.8 (H) 12/09/2023   HGBA1C 11.9 (H) 10/24/2023   HGBA1C 10.2 (A) 12/01/2022   MICROALBUR 50 06/26/2013     Lipid Panel ( most recent) Lipid Panel     Component Value Date/Time   CHOL 222 (H) 12/09/2023 1502   TRIG 295 (H) 12/09/2023 1502   TRIG 207 (H) 06/26/2013 1421   HDL 42 12/09/2023 1502   HDL 45 06/26/2013 1421   CHOLHDL 5.3 (H) 12/09/2023 1502   LDLCALC 128 (H) 12/09/2023 1502   LDLCALC 124 (H) 06/26/2013 1421   LABVLDL 52 (H) 12/09/2023 1502      Lab Results  Component Value Date   TSH 1.450 12/09/2023   TSH 2.364 05/18/2017   TSH 1.810 04/09/2014   TSH 1.730 06/26/2013   FREET4 1.25 12/09/2023           Assessment & Plan:   1) Type 2 Diabetes with complication   He presents today after long absence with his CGM showing above target glycemic profile overall.  His most recent A1c on 3/14 was 11.8%.  Analysis of his CGM shows TIR 13%, TAR 87%, TBR 0% with a GMI of 9.6%.  He is really liking his CGM, says  he is learning which foods are causing higher spikes so he can avoid them.  He  enjoys line dancing several times per week.  Albert Mcdonald has currently uncontrolled symptomatic type 2 DM since 53 years of age.   -Recent labs reviewed.  - I had a long discussion with him about the progressive nature of diabetes and the pathology behind its complications. -his diabetes is complicated by diabetic foot ulcers and he remains at a high risk for more acute and chronic complications which include CAD, CVA, CKD, retinopathy, and neuropathy. These are all discussed in detail with him.  The following Lifestyle Medicine recommendations according to American College of Lifestyle Medicine [facility]) were discussed and offered to patient and he agrees to start the journey:  A. Whole Foods, Plant-based plate comprising of fruits and vegetables, plant-based proteins, whole-grain carbohydrates was discussed in detail with the patient.   A list for source of those nutrients were also provided to the patient.  Patient will use only water or unsweetened tea for hydration. B.  The need to stay away from risky substances including alcohol, smoking; obtaining 7 to 9 hours of restorative sleep, at least 150 minutes of moderate intensity exercise weekly, the importance of healthy social connections,  and stress reduction techniques were discussed. C.  A full color page of  Calorie density of various food groups per pound showing examples of each food groups was provided to the patient.  - Nutritional counseling repeated at each appointment due to patients tendency to fall back in to old habits.  - The patient admits there is a room for improvement in their diet and drink choices. -  Suggestion is made for the patient to avoid simple carbohydrates from their diet including Cakes, Sweet Desserts / Pastries, Ice Cream, Soda (diet and regular), Sweet Tea, Candies, Chips, Cookies, Sweet Pastries, Store Bought Juices, Alcohol in Excess of 1-2 drinks a day, Artificial Sweeteners, Coffee Creamer, and  "Sugar-free" Products. This will help patient to have stable blood glucose profile and potentially avoid unintended weight gain.   - I encouraged the patient to switch to unprocessed or minimally processed complex starch and increased protein intake (animal or plant source), fruits, and vegetables.   - Patient is advised to stick to a routine mealtimes to eat 3 meals a day and avoid unnecessary snacks (to snack only to correct hypoglycemia).  - I have approached him with the following individualized plan to manage his diabetes and patient agrees:   -Due to cognitive deficits, will try and keep his regimen as simple as possible to increase compliance and safety.  He is advised to increase Lantus to 20 units SQ nightly.  He stopped taking Metformin due to stomach problems.  He never picked up the prescription for his Ozempic or Glipizide recently prescribed by PCP.  I advised him to stay off of this for now, hoping that with the CGM we can work on lifestyle modification and not need the complexity of those medications.   -he is encouraged to continue monitoring glucose at least twice daily (with his CGM), before breakfast and before bed, and reach out if glucose readings are less than  70 or above 300 for 3 tests in a row.  - he is warned not to take insulin without proper monitoring per orders. - Adjustment parameters are given to him for hypo and hyperglycemia in writing.  -He was previously on [place] but he stopped those shortly after his amputation.  -  Specific targets for  A1c; LDL, HDL, and Triglycerides were discussed with the patient.  2) Blood Pressure /Hypertension:  his blood pressure is controlled to target.   he is advised to continue his current medications as prescribed by his PCP.  3) Lipids/Hyperlipidemia:    Recent lipid panel from 12/09/23 shows uncontrolled LDL of 128 and elevated triglycerides of 295.  he is advised to continue Crestor 40 mg daily at bedtime-  although he did not bring this medication in with him today-not sure he has picked it up yet.  Side effects and precautions discussed with him.  4)  Weight/Diet:  his Body mass index is 30.53 kg/m.  -    he is NOT a candidate for major weight loss.  Exercise, and detailed carbohydrates information provided  -  detailed on discharge instructions.  5) Chronic Care/Health Maintenance: -he is on ACEI/ARB and Statin medications and is encouraged to initiate and continue to follow up with Ophthalmology, Dentist, Podiatrist at least yearly or according to recommendations, and advised to stay away from smoking. I have recommended yearly flu vaccine and pneumonia vaccine at least every 5 years; moderate intensity exercise for up to 150 minutes weekly; and sleep for at least 7 hours a day.  6)  Vitamin D deficiency Recent vitamin D level on 12/09/23 was 12.1.  I discussed and initiated replenishment with Ergocalciferol 50000 units weekly.   - he is advised to maintain close follow up with Del Nigel Berthold, FNP for primary care needs, as well as his other providers for optimal and coordinated care.     I spent  39  minutes in the care of the patient today including review of labs from CMP, Lipids, Thyroid Function, Hematology (current and previous including abstractions from other facilities); face-to-face time discussing  his blood glucose readings/logs, discussing hypoglycemia and hyperglycemia episodes and symptoms, medications doses, his options of short and long term treatment based on the latest standards of care / guidelines;  discussion about incorporating lifestyle medicine;  and documenting the encounter. Risk reduction counseling performed per USPSTF guidelines to reduce obesity and cardiovascular risk factors.     Please refer to Patient Instructions for Blood Glucose Monitoring and Insulin/Medications Dosing Guide"  in media tab for additional information. Please  also refer to " Patient  Self Inventory" in the Media  tab for reviewed elements of pertinent patient history.  Albert Mcdonald participated in the discussions, expressed understanding, and voiced agreement with the above plans.  All questions were answered to his satisfaction. he is encouraged to contact clinic should he have any questions or concerns prior to his return visit.     Follow up plan: - Return in about 3 months (around 03/24/2024) for Diabetes F/U with A1c in office, No previsit labs, Bring meter and logs.    Ronny Bacon, [facility] [address] [place], [place] [address] Phone: [phone] Fax: [phone]  12/23/2023, 8:29 AM

## 2023-12-26 ENCOUNTER — Other Ambulatory Visit (HOSPITAL_COMMUNITY): Payer: Self-pay

## 2023-12-26 ENCOUNTER — Telehealth: Payer: Self-pay | Admitting: Pharmacy Technician

## 2023-12-26 NOTE — Telephone Encounter (Signed)
 Pharmacy Patient Advocate Encounter   Received notification from CoverMyMeds that prior authorization for Vitamin D (Ergocalciferol) 1.25 MG(50000 UT) capsules is required/requested.   Insurance verification completed.   The patient is insured through [facility] .   Per test claim:  Not covered under Part D/Plan Exclusion. PA is not needed at this time. Medication can be filled at any [facility] for 30 days with a $5 copay.

## 2023-12-26 NOTE — Telephone Encounter (Signed)
 Pt states he was seen on 03/28

## 2023-12-30 ENCOUNTER — Ambulatory Visit

## 2023-12-30 ENCOUNTER — Ambulatory Visit (INDEPENDENT_AMBULATORY_CARE_PROVIDER_SITE_OTHER)

## 2023-12-30 ENCOUNTER — Ambulatory Visit (INDEPENDENT_AMBULATORY_CARE_PROVIDER_SITE_OTHER): Admitting: Podiatry

## 2023-12-30 ENCOUNTER — Encounter: Payer: Self-pay | Admitting: Podiatry

## 2023-12-30 DIAGNOSIS — M79671 Pain in right foot: Secondary | ICD-10-CM

## 2023-12-30 DIAGNOSIS — S93401A Sprain of unspecified ligament of right ankle, initial encounter: Secondary | ICD-10-CM

## 2023-12-30 DIAGNOSIS — E1142 Type 2 diabetes mellitus with diabetic polyneuropathy: Secondary | ICD-10-CM | POA: Diagnosis not present

## 2023-12-30 DIAGNOSIS — L97522 Non-pressure chronic ulcer of other part of left foot with fat layer exposed: Secondary | ICD-10-CM

## 2023-12-30 MED ORDER — MUPIROCIN 2 % EX OINT: 1.0000 | TOPICAL_OINTMENT | Freq: Every day | CUTANEOUS | 0 refills | Status: AC

## 2023-12-30 NOTE — Progress Notes (Signed)
 Chief Complaint  Patient presents with   Diabetic Ulcer    "It's doing good."    HPI: 53 y.o. male presenting for left hallux wound care he reports doing well level his callus over.  He does report continued problem with activity stating that he likes to go dancing.  He is in regular shoe gear today.  Of note, he does report twisting his right ankle approximately 3 days ago reports swelling with this and right 3rd toe bruising without pain secondary to neuropathy.  Last A1c 11.8.  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 ALS   Diabetes mellitus    Family history of anesthesia complication    Hyperlipidemia    Hypertension    Kleine-Levin syndrome    OCD (obsessive compulsive disorder)    Restless leg     Past Surgical History:  Procedure Laterality Date   AMPUTATION Right 02/26/2022   Procedure: AMPUTATION 5th TOE;  Surgeon: Nadara Mustard, MD;  Location: [facility] OR;  Service: Orthopedics;  Laterality: Right;   AMPUTATION TOE Left 07/30/2022   Procedure: AMPUTATION TOE, left third toe;  Surgeon: Lucretia Roers, MD;  Location: [facility] ORS;  Service: General;  Laterality: Left;   AMPUTATION TOE Left 11/01/2022   Procedure: AMPUTATION TOE, LEFT SECOND;  Surgeon: Lucretia Roers, MD;  Location: [facility] ORS;  Service: General;  Laterality: Left;   ANTERIOR CERVICAL DECOMP/DISCECTOMY FUSION  11/09/2012   Procedure: ANTERIOR CERVICAL DECOMPRESSION/DISCECTOMY FUSION 3 LEVELS C4-C7 ;  Surgeon: Venita Lick, MD;  Location: [facility] OR;  Service: Orthopedics;;  ACDF C4-C7     Allergies  Allergen Reactions   Ace Inhibitors Rash    Tolerates lisinopril    Smoker?: non-smoker  ROS denies any nausea, vomiting, fever, chills, chest pain, shortness of breath   PHYSICAL EXAM: There were no vitals filed for this visit.  General: The patient is alert and oriented x3 in no acute distress.  Dermatology: Skin is  warm, dry and supple bilateral lower extremities. Interspaces are clear of maceration and debris.  Diffuse callus present left first toe    Wound 1:  Location: Left hallux plantar aspect IPJ level        Depth: Subcutaneous tissue        Wound Border: Irregular, hyperkeratotic        Wound Base: Granular        Drainage: Minimal       Odor?:  No malodor        Surrounding Tissue: Diffuse callus to the left hallux        Infected?:  No        Necrosis?:  No        Pain?:  No        Tunneling: No       Dimensions (cm): Predebridement measurements: Hemorrhagic callus measuring 0.5 cm in diameter.  Postdebridement measurements 4 x 0.4 x 0.3 cm with healthy bleeding base.    Right third toe there is hemorrhagic callus distal aspect involving the medial aspect of the nail plate.  Thickened, dystrophic, mycotic nail as well.     Vascular: Pedal pulses are palpable 2/4 DP and PT.  Capillary refill time less than 3 seconds to the digits.  Pedal hair growth absent.  Localized edema right ankle lateral aspect.  Neurological: Light touch sensation grossly diminished.  Protective sensation and vibratory  sensation absent  Musculoskeletal Exam: Prior second and third toes amputations left foot.  Right ankle minimal pain on palpation lateral collateral ligament complex.  No gross instability.  Stiff hindfoot and ankle bilaterally.     Latest Ref Rng & Units 12/09/2023    3:02 PM 10/24/2023    9:31 AM  Hemoglobin A1C  Hemoglobin-A1c 4.8 - 5.6 % 11.8  11.9      RADIOGRAPHIC EXAM: Right ankle radiographs 3 views 12/30/23 Joint spaces preserved.  Ankle mortise in normal alignment.  No acute fractures or acute osseous abnormalities.  Right right foot radiographs 3 views 12/30/23: No acute fractures noted.  Prior partial fifth ray resection noted.  Hammertoe contractures of lesser digits without acute fracture.  Some fourth TMT J arthritis noted.  ASSESSMENT / PLAN OF CARE: 1. Skin ulcer of left  great toe with fat layer exposed (HCC)   2. Diabetic polyneuropathy associated with type 2 diabetes mellitus (HCC)   3. Sprain of right ankle, unspecified ligament, initial encounter      Meds ordered this encounter  Medications   mupirocin ointment (BACTROBAN) 2 %    Sig: Apply 1 Application topically daily for 22 days.    Dispense:  22 g    Refill:  0   None Right foot and ankle radiographs reviewed with patient.  The ulceration x 1 was sharply debrided of hyperkeratotic and devitalized soft tissue with sterile #15 blade to the level of subcutaneous tissue.  Hemostasis obtained with compression.  Wound cleansed with wound cleanser, mupirocin ointment and Coban bandage applied.  Reviewed off-loading with patient.  Offloading felt padding applied.  Instructed patient that he needs to be compliant with use of surgical shoe and practice limited stance and gait in order to get the wound to adequately heal.  Has had good progression with wound healing.  Continue with daily mupirocin dressing changes.  Mupirocin ordered for patient.  Continue applying ammonium lactate lotion to the surrounding callused skin.  Discussed risks / concerns regarding ulcer with patient and possible sequelae if left untreated.  Stressed importance of infection prevention at home. Short-term goals are: prevent infection, off-load ulcer, heal ulcer Long-term goals are:  prevent recurrence, prevent amputation.   Hemorrhagic callus of the right third toe was sharply debrided as a courtesy using a #15 blade without incident.  Ankle brace dispensed for right ankle sprain.  Discussed need for compliance with to avoid chronic instability and setting of neuropathy which could lead to a Charcot incident.  Return in about 2 weeks (around 01/13/2024) for Wound Care, [facility].   Bronwen Betters, DPM, AACFAS [facility]     [address] [place], [place] [address]                 Office [phone]  Fax [phone]

## 2024-01-07 ENCOUNTER — Other Ambulatory Visit: Payer: Self-pay | Admitting: Nurse Practitioner

## 2024-01-10 NOTE — Progress Notes (Signed)
   12/21/2023 Name: Albert Mcdonald MRN: [id number] DOB: 20-May-1971  Unsuccessful outreach to patient today.  Will have patient rescheduled for follow up.  Janiya Millirons Dattero Marleena Shubert, PharmD, BCACP, CPP Clinical Pharmacist, [facility]

## 2024-01-16 ENCOUNTER — Other Ambulatory Visit (HOSPITAL_COMMUNITY): Payer: Self-pay

## 2024-01-16 ENCOUNTER — Telehealth: Payer: Self-pay | Admitting: Pharmacy Technician

## 2024-01-16 NOTE — Telephone Encounter (Signed)
 Pharmacy Patient Advocate Encounter   Received notification from CoverMyMeds that prior authorization for Basaglar  KwikPen 100UNIT/ML pen-injectors is required/requested.   Insurance verification completed.   The patient is insured through [employer] .   Per test claim:  LANTUS  Albert Mcdonald is preferred by the insurance.  If suggested medication is appropriate, Please send in a new RX and discontinue this one. If not, please advise as to why it's not appropriate so that we may request a Prior Authorization. Please note, some preferred medications may still require a PA.  If the suggested medications have not been trialed and there are no contraindications to their use, the PA will not be submitted, as it will not be approved.  Lantus  Solostar filled by patient's pharmacy on 01/16/2024. Next refill available 03/12/2024. No PA needed at this time.

## 2024-01-19 ENCOUNTER — Ambulatory Visit: Payer: Self-pay | Admitting: Family Medicine

## 2024-01-20 ENCOUNTER — Ambulatory Visit

## 2024-01-20 VITALS — BP 129/86 | HR 82 | Resp 18 | Ht 73.0 in | Wt 222.0 lb

## 2024-01-20 DIAGNOSIS — Z794 Long term (current) use of insulin: Secondary | ICD-10-CM | POA: Diagnosis not present

## 2024-01-20 DIAGNOSIS — E118 Type 2 diabetes mellitus with unspecified complications: Secondary | ICD-10-CM

## 2024-01-20 DIAGNOSIS — E1159 Type 2 diabetes mellitus with other circulatory complications: Secondary | ICD-10-CM | POA: Diagnosis not present

## 2024-01-20 DIAGNOSIS — E1169 Type 2 diabetes mellitus with other specified complication: Secondary | ICD-10-CM

## 2024-01-20 NOTE — Progress Notes (Signed)
 Established Patient Office Visit  Subjective   Patient ID: Albert Mcdonald, male    DOB: Jul 11, 1971  Age: 53 y.o. MRN: [id number]  Chief Complaint  Patient presents with   Diabetes    Complains of high bg. Has been running in high 100 - low 200. States he has been taking glipizide  10mg  once daily, Lantus  inj every night, and ozempic  weekly per iliana's instructions     HPI CGM: above 43% In target 57 Below 0% Avg glucose 175 over last 7 days  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 ALS   Diabetes mellitus    Family history of anesthesia complication    Hyperlipidemia    Hypertension    Kleine-Levin syndrome    OCD (obsessive compulsive disorder)    Restless leg    Past Surgical History:  Procedure Laterality Date   AMPUTATION Right 02/26/2022   Procedure: AMPUTATION 5th TOE;  Surgeon: Timothy Ford, MD;  Location: [facility] OR;  Service: Orthopedics;  Laterality: Right;   AMPUTATION TOE Left 07/30/2022   Procedure: AMPUTATION TOE, left third toe;  Surgeon: Awilda Bogus, MD;  Location: [facility];  Service: General;  Laterality: Left;   AMPUTATION TOE Left 11/01/2022   Procedure: AMPUTATION TOE, LEFT SECOND;  Surgeon: Awilda Bogus, MD;  Location: [facility];  Service: General;  Laterality: Left;   ANTERIOR CERVICAL DECOMP/DISCECTOMY FUSION  11/09/2012   Procedure: ANTERIOR CERVICAL DECOMPRESSION/DISCECTOMY FUSION 3 LEVELS C4-C7 ;  Surgeon: Mort Ards, MD;  Location: [facility] OR;  Service: Orthopedics;;  ACDF C4-C7       ROS    Objective:     BP 129/86   Pulse 82   Resp 18   Ht 6\' 1"  (1.854 m)   Wt 222 lb 0.6 oz (100.7 kg)   SpO2 96%   BMI 29.29 kg/m  BP Readings from Last 3 Encounters:  01/20/24 129/86  12/23/23 128/84  12/09/23 135/81   Wt Readings from Last 3 Encounters:  01/20/24 222 lb 0.6 oz (100.7 kg)  12/23/23 231 lb 6.4 oz (105 kg)  12/09/23 223 lb 6.4 oz (101.3 kg)       Physical Exam Vitals and nursing note reviewed.  Constitutional:      Appearance: Normal appearance.  HENT:     Head: Normocephalic.  Cardiovascular:     Rate and Rhythm: Normal rate and regular rhythm.  Pulmonary:     Effort: Pulmonary effort is normal.     Breath sounds: Normal breath sounds.  Musculoskeletal:     Cervical back: Normal range of motion and neck supple.  Neurological:     Mental Status: He is alert and oriented to person, place, and time.  Psychiatric:        Mood and Affect: Mood normal.        Thought Content: Thought content normal.      No results found for any visits on 01/20/24.  Last CBC Lab Results  Component Value Date   WBC 7.5 12/09/2023   HGB 15.9 12/09/2023   HCT 47.3 12/09/2023   MCV 85 12/09/2023   MCH 28.6 12/09/2023   RDW 12.4 12/09/2023   PLT 179 12/09/2023   Last metabolic panel Lab Results  Component Value Date   GLUCOSE 319 (H) 12/09/2023   NA 140 12/09/2023   K 4.3 12/09/2023   CL 100 12/09/2023   CO2 23 12/09/2023  BUN 12 12/09/2023   CREATININE 0.75 (L) 12/09/2023   EGFR 109 12/09/2023   CALCIUM  9.6 12/09/2023   PHOS 2.8 07/29/2022   PROT 7.2 12/09/2023   ALBUMIN  4.7 12/09/2023   LABGLOB 2.5 12/09/2023   AGRATIO 2.8 (H) 04/09/2014   BILITOT 0.3 12/09/2023   ALKPHOS 148 (H) 12/09/2023   AST 15 12/09/2023   ALT 19 12/09/2023   ANIONGAP 9 10/26/2023   Last lipids Lab Results  Component Value Date   CHOL 222 (H) 12/09/2023   HDL 42 12/09/2023   LDLCALC 128 (H) 12/09/2023   TRIG 295 (H) 12/09/2023   CHOLHDL 5.3 (H) 12/09/2023   Last hemoglobin A1c Lab Results  Component Value Date   HGBA1C 11.8 (H) 12/09/2023   Last thyroid  functions Lab Results  Component Value Date   TSH 1.450 12/09/2023   T4TOTAL 8.5 04/09/2014   Last vitamin D  Lab Results  Component Value Date   VD25OH 12.1 (L) 12/09/2023      The 10-year ASCVD risk score (Arnett DK, et al., 2019) is: 12.7%    Assessment & Plan:    Problem List Items Addressed This Visit       Endocrine   Type 2 diabetes mellitus with complication (HCC) - Primary   Reviewed his CGM results, and blood sugars are improving overall, but still not at goal.  Recommend he increase Lantus  to 22 units at bedtime based on these readings.  F/U with Endo as scheduled.        No follow-ups on file.    Alison Irvine, FNP

## 2024-01-20 NOTE — Patient Instructions (Addendum)
 Increase Lantus  to 22 units at bedtime based on recent CGM readings.  F/U with Endo as scheduled.

## 2024-01-23 NOTE — Assessment & Plan Note (Signed)
 Reviewed his CGM results, and blood sugars are improving overall, but still not at goal.  Recommend he increase Lantus  to 22 units at bedtime based on these readings.  F/U with Endo as scheduled.

## 2024-02-02 ENCOUNTER — Ambulatory Visit (INDEPENDENT_AMBULATORY_CARE_PROVIDER_SITE_OTHER): Admitting: Podiatry

## 2024-02-02 ENCOUNTER — Encounter: Payer: Self-pay | Admitting: Podiatry

## 2024-02-02 DIAGNOSIS — L97522 Non-pressure chronic ulcer of other part of left foot with fat layer exposed: Secondary | ICD-10-CM | POA: Diagnosis not present

## 2024-02-02 DIAGNOSIS — E1142 Type 2 diabetes mellitus with diabetic polyneuropathy: Secondary | ICD-10-CM

## 2024-02-02 DIAGNOSIS — Z89422 Acquired absence of other left toe(s): Secondary | ICD-10-CM

## 2024-02-02 MED ORDER — MUPIROCIN 2 % EX OINT
1.0000 | TOPICAL_OINTMENT | Freq: Two times a day (BID) | CUTANEOUS | 2 refills | Status: DC
Start: 2024-02-02 — End: 2024-04-06

## 2024-02-02 NOTE — Progress Notes (Signed)
 Chief Complaint  Patient presents with   Foot Ulcer    Rm 15/ F/u on ulceration right 2nd toen and left hallux/ Pt reports he is feeling well./ diabetic/ Bloodsugar 210/     HPI: 53 y.o. male presenting for left hallux wound care.  He has significant recurrent callus.  He returns wearing regular shoe gear.  Right ankle doing well, he denies any pain today after spraining it several weeks ago.  Last A1c 11.8.  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 ALS   Diabetes mellitus    Family history of anesthesia complication    Hyperlipidemia    Hypertension    Kleine-Levin syndrome    OCD (obsessive compulsive disorder)    Restless leg     Past Surgical History:  Procedure Laterality Date   AMPUTATION Right 02/26/2022   Procedure: AMPUTATION 5th TOE;  Surgeon: Timothy Ford, MD;  Location: [facility] OR;  Service: Orthopedics;  Laterality: Right;   AMPUTATION TOE Left 07/30/2022   Procedure: AMPUTATION TOE, left third toe;  Surgeon: Awilda Bogus, MD;  Location: [facility];  Service: General;  Laterality: Left;   AMPUTATION TOE Left 11/01/2022   Procedure: AMPUTATION TOE, LEFT SECOND;  Surgeon: Awilda Bogus, MD;  Location: [facility] OR[facility];  Service: General;  Laterality: Left;   ANTERIOR CERVICAL DECOMP/DISCECTOMY FUSION  11/09/2012   Procedure: ANTERIOR CERVICAL DECOMPRESSION/DISCECTOMY FUSION 3 LEVELS C4-C7 ;  Surgeon: Mort Ards, MD;  Location: [facility] OR;  Service: Orthopedics;;  ACDF C4-C7     Allergies  Allergen Reactions   Ace Inhibitors Rash    Tolerates lisinopril     Smoker?: non-smoker  ROS denies any nausea, vomiting, fever, chills, chest pain, shortness of breath   PHYSICAL EXAM: There were no vitals filed for this visit.  General: The patient is alert and oriented x3 in no acute distress.  Dermatology: Skin is warm, dry and supple bilateral lower extremities. Interspaces are clear of  maceration and debris.  Significant diffuse hemorrhagic callus present left first toe    Wound 1:  Location: Left hallux plantar aspect IPJ level        Depth: Subcutaneous tissue        Wound Border: Significant periwound callus with hemorrhagic callus about the first toe.        Wound Base: Granular following debridement with devitalized fibrin and callus predebridement        Drainage: Minimal       Odor?:  No malodor        Surrounding Tissue: Diffuse callus to the left hallux        Infected?:  No        Necrosis?:  No        Pain?:  No        Tunneling: No       Dimensions (cm): Predebridement measurements: Hemorrhagic callus measuring 0.5 cm in diameter.  Postdebridement measurements 0.6 x 0.6 x 0.3 cm with healthy bleeding base.    Right third toe there is hemorrhagic callus distal aspect involving the distal aspect of the nail plate.  Thickened, dystrophic, mycotic nail as well.     Vascular: Pedal pulses are palpable 2/4 DP and PT.  Capillary refill time less than 3 seconds to the digits.  Pedal hair growth absent.  Localized edema right ankle lateral aspect.  Neurological: Light touch sensation grossly  diminished.  Protective sensation and vibratory sensation absent  Musculoskeletal Exam: Prior second and third toes amputations left foot.  Right ankle minimal pain on palpation lateral collateral ligament complex.  No gross instability.      Latest Ref Rng & Units 12/09/2023    3:02 PM 10/24/2023    9:31 AM  Hemoglobin A1C  Hemoglobin-A1c 4.8 - 5.6 % 11.8  11.9      ASSESSMENT / PLAN OF CARE: 1. Skin ulcer of left great toe with fat layer exposed (HCC)   2. Diabetic polyneuropathy associated with type 2 diabetes mellitus (HCC)   3. History of amputation of left second toe (HCC)       Meds ordered this encounter  Medications   mupirocin  ointment (BACTROBAN ) 2 %    Sig: Apply 1 Application topically 2 (two) times daily.    Dispense:  30 g    Refill:  2    None Right foot and ankle radiographs reviewed with patient.  The ulceration x 1 was sharply debrided of hyperkeratotic and devitalized soft tissue with sterile #15 blade to the level of subcutaneous tissue.  Hemostasis obtained with compression.  Wound cleansed with wound cleanser, mupirocin  ointment and Coban bandage applied.  Reviewed off-loading with patient.  Offloading felt padding applied.  Instructed patient that he needs to be compliant with use of surgical shoe and practice limited stance and gait in order to get the wound to adequately heal. He has continued to return using regular shoegear and has had excessive hemorrhagic callus formation as a result. This is delaying wound healing.  Refill of mupirocin  sent to patient's pharmacy for daily dressing changes.  Emphasized need for offloading and limiting weightbearing activity.  Continue applying ammonium lactate  lotion to the surrounding callused skin.  Discussed risks / concerns regarding ulcer with patient and possible sequelae if left untreated.  Stressed importance of infection prevention at home. Short-term goals are: prevent infection, off-load ulcer, heal ulcer Long-term goals are:  prevent recurrence, prevent amputation.   Hemorrhagic callus of the right third toe was sharply debrided as a courtesy using a #15 blade without incident.    Return in about 3 weeks (around 02/23/2024) for Wound Care.   Eve Hinders, DPM, AACFAS [facility]     [address] [place], [place] [address]                Office [phone]  Fax [phone]

## 2024-02-17 ENCOUNTER — Ambulatory Visit: Admitting: Podiatry

## 2024-02-23 ENCOUNTER — Ambulatory Visit (INDEPENDENT_AMBULATORY_CARE_PROVIDER_SITE_OTHER): Admitting: Podiatry

## 2024-02-23 ENCOUNTER — Encounter: Payer: Self-pay | Admitting: Podiatry

## 2024-02-23 DIAGNOSIS — M79674 Pain in right toe(s): Secondary | ICD-10-CM

## 2024-02-23 DIAGNOSIS — L02612 Cutaneous abscess of left foot: Secondary | ICD-10-CM

## 2024-02-23 DIAGNOSIS — L03032 Cellulitis of left toe: Secondary | ICD-10-CM

## 2024-02-23 DIAGNOSIS — B351 Tinea unguium: Secondary | ICD-10-CM | POA: Diagnosis not present

## 2024-02-23 DIAGNOSIS — Z89422 Acquired absence of other left toe(s): Secondary | ICD-10-CM

## 2024-02-23 DIAGNOSIS — M79675 Pain in left toe(s): Secondary | ICD-10-CM | POA: Diagnosis not present

## 2024-02-23 DIAGNOSIS — L97522 Non-pressure chronic ulcer of other part of left foot with fat layer exposed: Secondary | ICD-10-CM

## 2024-02-23 DIAGNOSIS — E1142 Type 2 diabetes mellitus with diabetic polyneuropathy: Secondary | ICD-10-CM

## 2024-02-23 MED ORDER — AMOXICILLIN-POT CLAVULANATE 875-125 MG PO TABS: 1.0000 | ORAL_TABLET | Freq: Two times a day (BID) | ORAL | 0 refills | Status: DC

## 2024-02-23 MED ORDER — MUPIROCIN 2 % EX OINT: 1.0000 | TOPICAL_OINTMENT | Freq: Two times a day (BID) | CUTANEOUS | 2 refills | Status: DC

## 2024-02-23 NOTE — Progress Notes (Signed)
 Chief Complaint  Patient presents with   Blister    Purple blister left 4th toe popped up 3 days ago. Wound cream applied. IDDM A1C 11.    HPI: 53 y.o. male presenting for left hallux wound care.  He has significant recurrent callus.  He returns wearing regular shoe gear.  He is presenting with new blister to the left fourth toe which she noticed 3 days ago.  Nailplates also causing the patient pain.  They are thickened, elongated and dystrophic.  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 ALS   Diabetes mellitus    Family history of anesthesia complication    Hyperlipidemia    Hypertension    Kleine-Levin syndrome    OCD (obsessive compulsive disorder)    Restless leg     Past Surgical History:  Procedure Laterality Date   AMPUTATION Right 02/26/2022   Procedure: AMPUTATION 5th TOE;  Surgeon: Timothy Ford, MD;  Location: [facility] OR;  Service: Orthopedics;  Laterality: Right;   AMPUTATION TOE Left 07/30/2022   Procedure: AMPUTATION TOE, left third toe;  Surgeon: Awilda Bogus, MD;  Location: [facility] ORS;  Service: General;  Laterality: Left;   AMPUTATION TOE Left 11/01/2022   Procedure: AMPUTATION TOE, LEFT SECOND;  Surgeon: Awilda Bogus, MD;  Location: [facility] ORS;  Service: General;  Laterality: Left;   ANTERIOR CERVICAL DECOMP/DISCECTOMY FUSION  11/09/2012   Procedure: ANTERIOR CERVICAL DECOMPRESSION/DISCECTOMY FUSION 3 LEVELS C4-C7 ;  Surgeon: Mort Ards, MD;  Location: [facility] OR;  Service: Orthopedics;;  ACDF C4-C7     Allergies  Allergen Reactions   Ace Inhibitors Rash    Tolerates lisinopril     Smoker?: non-smoker  ROS denies any nausea, vomiting, fever, chills, chest pain, shortness of breath   PHYSICAL EXAM: There were no vitals filed for this visit.  General: The patient is alert and oriented x3 in no acute distress.  Dermatology: Skin is warm, dry and supple bilateral lower  extremities. Interspaces are clear of maceration and debris.  Significant diffuse hemorrhagic callus present left first toe    Wound 1:  Location: Left hallux plantar aspect IPJ level        Depth: Subcutaneous tissue        Wound Border: Significant periwound callus with hemorrhagic callus about the first toe.        Wound Base: Granular following debridement with devitalized fibrin and callus predebridement        Drainage: Minimal       Odor?:  No malodor        Surrounding Tissue: Diffuse callus to the left hallux        Infected?:  No        Necrosis?:  No        Pain?:  No        Tunneling: No       Dimensions (cm): Predebridement measurements: Hemorrhagic callus with central ulcer 0.5 x 0.6 x 0.3 cm in diameter.  Postdebridement measurements 0.9 x 0.8 x 0.3 cm with healthy bleeding base.  Surrounding hemorrhagic callus debrided as well.  Wound 2: Location: Left fourth toe hemorrhagic bulla distal medial aspect  Depth: Partial-thickness skin breakdown  Wound border: Regular  Wound base: Dermal tissue  Drainage: Serosanguineous  Odor: No  Surrounding tissue: Mild erythema  Infected: Some surrounding erythema  Necrosis: No  Pain: No  Tunneling:  No, does involve the nail plate  Dimensions (cm): 1.5 cm diameter hemorrhagic bulla, incision and drainage performed, lesion does involve the affected nail bed measures about 2 x 1.5 cm x 0.2 cm.  Nail plates x 7 are thickened, elongated, dystrophic with subungual debris and yellow discoloration.  They are tender on direct dorsal palpation.  Vascular: Pedal pulses are palpable 2/4 DP and PT.  Capillary refill time less than 3 seconds to the digits.  Pedal hair growth absent.  Localized edema right ankle lateral aspect.  Neurological: Light touch sensation grossly diminished.  Protective sensation and vibratory sensation absent  Musculoskeletal Exam: Prior second and third toes amputations left foot.  Prior right foot partial fifth ray  resection.     Latest Ref Rng & Units 12/09/2023    3:02 PM 10/24/2023    9:31 AM  Hemoglobin A1C  Hemoglobin-A1c 4.8 - 5.6 % 11.8  11.9      ASSESSMENT / PLAN OF CARE: 1. Skin ulcer of left great toe with fat layer exposed (HCC)   2. Pain due to onychomycosis of toenails of both feet   3. Diabetic polyneuropathy associated with type 2 diabetes mellitus (HCC)   4. History of amputation of left second toe (HCC)   5. Cellulitis and abscess of toe of left foot       Meds ordered this encounter  Medications   amoxicillin -clavulanate (AUGMENTIN ) 875-125 MG tablet    Sig: Take 1 tablet by mouth 2 (two) times daily.    Dispense:  20 tablet    Refill:  0   mupirocin  ointment (BACTROBAN ) 2 %    Sig: Apply 1 Application topically 2 (two) times daily.    Dispense:  30 g    Refill:  2   None  # Left hallux ulceration with subcutaneous tissue involvement Medically necessary excisional debridement of left hallux ulceration was performed using 312 scalpel blade of all nonviable skin and subcutaneous tissue.  Pre and postdebridement measurements as described above.  Hemostasis achieved with compression.  Wound cleansed with wound cleanser solution.  Antibiotic bandage applied.  Reviewed daily home dressing changes with patient and applied offloading felt padding. - Emphasized need for limited stance and gait - Encouraged use of surgical shoe  # Left fourth toe hemorrhagic bulla with underlying ulceration and cellulitis - Presenting with new problem today - Prescribing 10 days of oral Augmentin  for fourth toe cellulitis - At increased risk of healing complications due to diabetic neuropathy which is poorly controlled - Reviewed daily dressing changes with patient including mupirocin  ointment which she has been applying to the first toe ulcer - Imaging at follow up if signs of infection present - Discussed signs and symptoms of worsening infection including worsening redness, swelling,  drainage, pain, malodor including signs of systemic infection.  If these develop contact office or go to ED immediately.  Procedure: Left fourth toe incision and drainage Skin prep: Betadine solution Findings: Hemorrhagic bulla was lanced and deroofed using 312 scalpel blade and tissue nipper.  Serosanguineous tissue was noted.  Medial aspect of the deroofed blister limited to partial-thickness skin breakdown.  Did involve the fourth toenail plate which was then avulsed with exposed nailbed. -Area of involvement 2 x 1.5 x 0.2 cm - Dressed with mupirocin  ointment, dry gauze, Coban  # Pain due to onychomycosis - Remaining nail plates x 6 were debrided in thickness and length using sterile nail nippers without incident - Mechanical bur used to file down the nails  Patient educated on diabetes. Discussed proper diabetic foot care and discussed risks and complications of disease. Educated patient in depth on reasons to return to the office immediately should he/she discover anything concerning or new on the feet. All questions answered. Discussed proper shoes as well.     Return in about 2 weeks (around 03/08/2024) for Wound Care.   Eve Hinders, DPM, AACFAS [facility]     [address] [place], [place] [address]                Office [phone]  Fax [phone]

## 2024-02-27 DIAGNOSIS — G4713 Recurrent hypersomnia: Secondary | ICD-10-CM | POA: Diagnosis not present

## 2024-02-27 DIAGNOSIS — Z89421 Acquired absence of other right toe(s): Secondary | ICD-10-CM | POA: Diagnosis not present

## 2024-02-27 DIAGNOSIS — G2581 Restless legs syndrome: Secondary | ICD-10-CM | POA: Diagnosis not present

## 2024-02-27 DIAGNOSIS — G1221 Amyotrophic lateral sclerosis: Secondary | ICD-10-CM | POA: Diagnosis not present

## 2024-02-27 DIAGNOSIS — S91104D Unspecified open wound of right lesser toe(s) without damage to nail, subsequent encounter: Secondary | ICD-10-CM | POA: Diagnosis not present

## 2024-02-27 DIAGNOSIS — Z7984 Long term (current) use of oral hypoglycemic drugs: Secondary | ICD-10-CM | POA: Diagnosis not present

## 2024-02-27 DIAGNOSIS — Z794 Long term (current) use of insulin: Secondary | ICD-10-CM | POA: Diagnosis not present

## 2024-02-27 DIAGNOSIS — L02612 Cutaneous abscess of left foot: Secondary | ICD-10-CM | POA: Diagnosis not present

## 2024-02-27 DIAGNOSIS — Z9181 History of falling: Secondary | ICD-10-CM | POA: Diagnosis not present

## 2024-02-27 DIAGNOSIS — Z89422 Acquired absence of other left toe(s): Secondary | ICD-10-CM | POA: Diagnosis not present

## 2024-02-27 DIAGNOSIS — E1140 Type 2 diabetes mellitus with diabetic neuropathy, unspecified: Secondary | ICD-10-CM | POA: Diagnosis not present

## 2024-02-27 DIAGNOSIS — E785 Hyperlipidemia, unspecified: Secondary | ICD-10-CM | POA: Diagnosis not present

## 2024-02-27 DIAGNOSIS — I10 Essential (primary) hypertension: Secondary | ICD-10-CM | POA: Diagnosis not present

## 2024-02-27 DIAGNOSIS — Z981 Arthrodesis status: Secondary | ICD-10-CM | POA: Diagnosis not present

## 2024-02-27 DIAGNOSIS — L03032 Cellulitis of left toe: Secondary | ICD-10-CM | POA: Diagnosis not present

## 2024-02-27 DIAGNOSIS — Z7985 Long-term (current) use of injectable non-insulin antidiabetic drugs: Secondary | ICD-10-CM | POA: Diagnosis not present

## 2024-03-05 DIAGNOSIS — E1140 Type 2 diabetes mellitus with diabetic neuropathy, unspecified: Secondary | ICD-10-CM | POA: Diagnosis not present

## 2024-03-05 DIAGNOSIS — Z981 Arthrodesis status: Secondary | ICD-10-CM | POA: Diagnosis not present

## 2024-03-05 DIAGNOSIS — Z7984 Long term (current) use of oral hypoglycemic drugs: Secondary | ICD-10-CM | POA: Diagnosis not present

## 2024-03-05 DIAGNOSIS — L02612 Cutaneous abscess of left foot: Secondary | ICD-10-CM | POA: Diagnosis not present

## 2024-03-05 DIAGNOSIS — Z89421 Acquired absence of other right toe(s): Secondary | ICD-10-CM | POA: Diagnosis not present

## 2024-03-05 DIAGNOSIS — G4713 Recurrent hypersomnia: Secondary | ICD-10-CM | POA: Diagnosis not present

## 2024-03-05 DIAGNOSIS — Z89422 Acquired absence of other left toe(s): Secondary | ICD-10-CM | POA: Diagnosis not present

## 2024-03-05 DIAGNOSIS — L03032 Cellulitis of left toe: Secondary | ICD-10-CM | POA: Diagnosis not present

## 2024-03-05 DIAGNOSIS — G2581 Restless legs syndrome: Secondary | ICD-10-CM | POA: Diagnosis not present

## 2024-03-05 DIAGNOSIS — I10 Essential (primary) hypertension: Secondary | ICD-10-CM | POA: Diagnosis not present

## 2024-03-05 DIAGNOSIS — Z794 Long term (current) use of insulin: Secondary | ICD-10-CM | POA: Diagnosis not present

## 2024-03-05 DIAGNOSIS — E785 Hyperlipidemia, unspecified: Secondary | ICD-10-CM | POA: Diagnosis not present

## 2024-03-05 DIAGNOSIS — G1221 Amyotrophic lateral sclerosis: Secondary | ICD-10-CM | POA: Diagnosis not present

## 2024-03-05 DIAGNOSIS — Z7985 Long-term (current) use of injectable non-insulin antidiabetic drugs: Secondary | ICD-10-CM | POA: Diagnosis not present

## 2024-03-05 DIAGNOSIS — Z9181 History of falling: Secondary | ICD-10-CM | POA: Diagnosis not present

## 2024-03-09 ENCOUNTER — Encounter: Payer: Self-pay | Admitting: Podiatry

## 2024-03-09 ENCOUNTER — Ambulatory Visit (INDEPENDENT_AMBULATORY_CARE_PROVIDER_SITE_OTHER): Admitting: Podiatry

## 2024-03-09 DIAGNOSIS — L97522 Non-pressure chronic ulcer of other part of left foot with fat layer exposed: Secondary | ICD-10-CM | POA: Diagnosis not present

## 2024-03-09 DIAGNOSIS — E1142 Type 2 diabetes mellitus with diabetic polyneuropathy: Secondary | ICD-10-CM | POA: Diagnosis not present

## 2024-03-09 MED ORDER — SILVER SULFADIAZINE 1 % EX CREA: 1.0000 | TOPICAL_CREAM | Freq: Every day | CUTANEOUS | 0 refills | Status: AC

## 2024-03-09 NOTE — Progress Notes (Unsigned)
 Chief Complaint  Patient presents with   Wound Check    Patient states that everything has been fine since last appointment, no pain, no drainage     HPI: 53 y.o. male presenting for left hallux wound care.  He has significant recurrent callus.  He states that he has been on his foot quite a bit and the hemorrhagic callus about the left first toe has worsened.  Left fourth toe appears to have healed well.  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 ALS   Diabetes mellitus    Family history of anesthesia complication    Hyperlipidemia    Hypertension    Kleine-Levin syndrome    OCD (obsessive compulsive disorder)    Restless leg     Past Surgical History:  Procedure Laterality Date   AMPUTATION Right 02/26/2022   Procedure: AMPUTATION 5th TOE;  Surgeon: Timothy Ford, MD;  Location: [facility] OR;  Service: Orthopedics;  Laterality: Right;   AMPUTATION TOE Left 07/30/2022   Procedure: AMPUTATION TOE, left third toe;  Surgeon: Awilda Bogus, MD;  Location: [facility];  Service: General;  Laterality: Left;   AMPUTATION TOE Left 11/01/2022   Procedure: AMPUTATION TOE, LEFT SECOND;  Surgeon: Awilda Bogus, MD;  Location: [facility];  Service: General;  Laterality: Left;   ANTERIOR CERVICAL DECOMP/DISCECTOMY FUSION  11/09/2012   Procedure: ANTERIOR CERVICAL DECOMPRESSION/DISCECTOMY FUSION 3 LEVELS C4-C7 ;  Surgeon: Mort Ards, MD;  Location: [facility] OR;  Service: Orthopedics;;  ACDF C4-C7     Allergies  Allergen Reactions   Ace Inhibitors Rash    Tolerates lisinopril     Smoker?: non-smoker  ROS denies any nausea, vomiting, fever, chills, chest pain, shortness of breath   PHYSICAL EXAM: There were no vitals filed for this visit.  General: The patient is alert and oriented x3 in no acute distress.  Dermatology: Skin is warm, dry and supple bilateral lower extremities. Interspaces are clear of  maceration and debris.  Significant diffuse hemorrhagic callus present left first toe    Wound 1:  Location: Left hallux plantar aspect IPJ level        Depth: Subcutaneous tissue        Wound Border: Significant periwound callus with hemorrhagic callus about the first toe.        Wound Base: Granular following debridement with devitalized fibrin and callus predebridement        Drainage: Minimal       Odor?:  No malodor        Surrounding Tissue: Diffuse callus to the left hallux with partial thickness skin loss to the dermal layer of tissue        Infected?:  No        Necrosis?:  No        Pain?:  No        Tunneling: No       Dimensions (cm): Predebridement measurements: Hemorrhagic callus with central ulcer 0.5 x 0.6 x 0.3 cm in diameter, stagnant from previous.  Postdebridement measurements 1 x 1 x 0.3 cm with healthy bleeding base.  Surrounding hemorrhagic callus debrided as well to the level of dermal tissue  Left fourth toe bulla and cellulitis appears resolved.  Nail plates x 7 mycotic in appearance..  Vascular: Pedal pulses are palpable 2/4 DP and PT.  Capillary refill time less than 3 seconds to  the digits.  Pedal hair growth absent.  Localized edema right ankle lateral aspect.  Neurological: Light touch sensation grossly diminished.  Protective sensation and vibratory sensation absent  Musculoskeletal Exam: Prior second and third toes amputations left foot.  Prior right foot partial fifth ray resection.     Latest Ref Rng & Units 12/09/2023    3:02 PM 10/24/2023    9:31 AM  Hemoglobin A1C  Hemoglobin-A1c 4.8 - 5.6 % 11.8  11.9      ASSESSMENT / PLAN OF CARE: 1. Skin ulcer of left great toe with fat layer exposed (HCC)   2. Diabetic polyneuropathy associated with type 2 diabetes mellitus (HCC)       Meds ordered this encounter  Medications   silver sulfADIAZINE (SILVADENE) 1 % cream    Sig: Apply 1 Application topically daily.    Dispense:  50 g    Refill:  0    None  # Left hallux ulceration with subcutaneous tissue involvement Medically necessary excisional debridement of left hallux ulceration was performed using 312 scalpel blade of all nonviable skin and subcutaneous tissue.  Debridement of the periwound hemorrhagic callus was debrided as well down to dermal tissue.  Pre and postdebridement measurements as described above.  Hemostasis achieved with compression.  Wound cleansed with wound cleanser solution.  Antibiotic bandage applied.  Reviewed daily home dressing changes with patient and applied offloading felt padding. - Emphasized need for limited stance and gait-he does report continued ambulation - Encouraged use of surgical shoe-he does again present using regular shoe gear.  Patient educated on diabetes. Discussed proper diabetic foot care and discussed risks and complications of disease. Educated patient in depth on reasons to return to the office immediately should he/she discover anything concerning or new on the feet. All questions answered. Discussed proper shoes as well.   Discussed importance of tight glucose control, he has had elevated A1c which has also been detrimental to wound healing   Return in about 2 weeks (around 03/23/2024) for Wound Care.   Eve Hinders, DPM, AACFAS [facility]     [address] [place], [place] [address]                Office [phone]  Fax [phone]

## 2024-03-12 ENCOUNTER — Encounter: Payer: Self-pay | Admitting: Podiatry

## 2024-03-13 DIAGNOSIS — E1140 Type 2 diabetes mellitus with diabetic neuropathy, unspecified: Secondary | ICD-10-CM | POA: Diagnosis not present

## 2024-03-13 DIAGNOSIS — Z9181 History of falling: Secondary | ICD-10-CM | POA: Diagnosis not present

## 2024-03-13 DIAGNOSIS — G4713 Recurrent hypersomnia: Secondary | ICD-10-CM | POA: Diagnosis not present

## 2024-03-13 DIAGNOSIS — L02612 Cutaneous abscess of left foot: Secondary | ICD-10-CM | POA: Diagnosis not present

## 2024-03-13 DIAGNOSIS — Z794 Long term (current) use of insulin: Secondary | ICD-10-CM | POA: Diagnosis not present

## 2024-03-13 DIAGNOSIS — G2581 Restless legs syndrome: Secondary | ICD-10-CM | POA: Diagnosis not present

## 2024-03-13 DIAGNOSIS — Z7985 Long-term (current) use of injectable non-insulin antidiabetic drugs: Secondary | ICD-10-CM | POA: Diagnosis not present

## 2024-03-13 DIAGNOSIS — G1221 Amyotrophic lateral sclerosis: Secondary | ICD-10-CM | POA: Diagnosis not present

## 2024-03-13 DIAGNOSIS — Z89421 Acquired absence of other right toe(s): Secondary | ICD-10-CM | POA: Diagnosis not present

## 2024-03-13 DIAGNOSIS — Z7984 Long term (current) use of oral hypoglycemic drugs: Secondary | ICD-10-CM | POA: Diagnosis not present

## 2024-03-13 DIAGNOSIS — L03032 Cellulitis of left toe: Secondary | ICD-10-CM | POA: Diagnosis not present

## 2024-03-13 DIAGNOSIS — E785 Hyperlipidemia, unspecified: Secondary | ICD-10-CM | POA: Diagnosis not present

## 2024-03-13 DIAGNOSIS — Z89422 Acquired absence of other left toe(s): Secondary | ICD-10-CM | POA: Diagnosis not present

## 2024-03-13 DIAGNOSIS — Z981 Arthrodesis status: Secondary | ICD-10-CM | POA: Diagnosis not present

## 2024-03-13 DIAGNOSIS — I10 Essential (primary) hypertension: Secondary | ICD-10-CM | POA: Diagnosis not present

## 2024-03-21 DIAGNOSIS — G2581 Restless legs syndrome: Secondary | ICD-10-CM | POA: Diagnosis not present

## 2024-03-21 DIAGNOSIS — G1221 Amyotrophic lateral sclerosis: Secondary | ICD-10-CM | POA: Diagnosis not present

## 2024-03-21 DIAGNOSIS — G4713 Recurrent hypersomnia: Secondary | ICD-10-CM | POA: Diagnosis not present

## 2024-03-21 DIAGNOSIS — I10 Essential (primary) hypertension: Secondary | ICD-10-CM | POA: Diagnosis not present

## 2024-03-21 DIAGNOSIS — Z794 Long term (current) use of insulin: Secondary | ICD-10-CM | POA: Diagnosis not present

## 2024-03-21 DIAGNOSIS — Z89422 Acquired absence of other left toe(s): Secondary | ICD-10-CM | POA: Diagnosis not present

## 2024-03-21 DIAGNOSIS — L02612 Cutaneous abscess of left foot: Secondary | ICD-10-CM | POA: Diagnosis not present

## 2024-03-21 DIAGNOSIS — Z7984 Long term (current) use of oral hypoglycemic drugs: Secondary | ICD-10-CM | POA: Diagnosis not present

## 2024-03-21 DIAGNOSIS — Z9181 History of falling: Secondary | ICD-10-CM | POA: Diagnosis not present

## 2024-03-21 DIAGNOSIS — Z981 Arthrodesis status: Secondary | ICD-10-CM | POA: Diagnosis not present

## 2024-03-21 DIAGNOSIS — E1140 Type 2 diabetes mellitus with diabetic neuropathy, unspecified: Secondary | ICD-10-CM | POA: Diagnosis not present

## 2024-03-21 DIAGNOSIS — Z89421 Acquired absence of other right toe(s): Secondary | ICD-10-CM | POA: Diagnosis not present

## 2024-03-21 DIAGNOSIS — L03032 Cellulitis of left toe: Secondary | ICD-10-CM | POA: Diagnosis not present

## 2024-03-21 DIAGNOSIS — E785 Hyperlipidemia, unspecified: Secondary | ICD-10-CM | POA: Diagnosis not present

## 2024-03-21 DIAGNOSIS — Z7985 Long-term (current) use of injectable non-insulin antidiabetic drugs: Secondary | ICD-10-CM | POA: Diagnosis not present

## 2024-03-22 ENCOUNTER — Ambulatory Visit (INDEPENDENT_AMBULATORY_CARE_PROVIDER_SITE_OTHER): Admitting: Family Medicine

## 2024-03-22 ENCOUNTER — Ambulatory Visit (INDEPENDENT_AMBULATORY_CARE_PROVIDER_SITE_OTHER)

## 2024-03-22 ENCOUNTER — Encounter: Payer: Self-pay | Admitting: Family Medicine

## 2024-03-22 ENCOUNTER — Telehealth: Payer: Self-pay | Admitting: Podiatry

## 2024-03-22 VITALS — BP 135/86 | HR 100 | Ht 73.0 in | Wt 218.0 lb

## 2024-03-22 DIAGNOSIS — E559 Vitamin D deficiency, unspecified: Secondary | ICD-10-CM | POA: Diagnosis not present

## 2024-03-22 DIAGNOSIS — I10 Essential (primary) hypertension: Secondary | ICD-10-CM | POA: Diagnosis not present

## 2024-03-22 DIAGNOSIS — E782 Mixed hyperlipidemia: Secondary | ICD-10-CM

## 2024-03-22 DIAGNOSIS — E118 Type 2 diabetes mellitus with unspecified complications: Secondary | ICD-10-CM | POA: Diagnosis not present

## 2024-03-22 LAB — HM DIABETES EYE EXAM

## 2024-03-22 MED ORDER — LANTUS SOLOSTAR 100 UNIT/ML ~~LOC~~ SOPN: 20.0000 [IU] | PEN_INJECTOR | Freq: Every day | SUBCUTANEOUS | 3 refills | Status: DC

## 2024-03-22 NOTE — Assessment & Plan Note (Signed)
 Vitals:   03/22/24 0820 03/22/24 0840  BP: (!) 130/90 135/86  Continue Amlodipine  5 mg once daily Labs ordered. Discussed with  patient to monitor their blood pressure regularly and maintain a heart-healthy diet rich in fruits, vegetables, whole grains, and low-fat dairy, while reducing sodium intake to less than 2,300 mg per day. Regular physical activity, such as 30 minutes of moderate exercise most days of the week, will help lower blood pressure and improve overall cardiovascular health. Avoiding smoking, limiting alcohol consumption, and managing stress. Take  prescribed medication, & take it as directed and avoid skipping doses. Seek emergency care if your blood pressure is (over 180/100) or you experience chest pain, shortness of breath, or sudden vision changes.Patient verbalizes understanding regarding plan of care and all questions answered.

## 2024-03-22 NOTE — Assessment & Plan Note (Signed)
 Previous LDL 128 Patient reports taking Crestor  40 mg once daily Repeat Lipid panel done today  Cholesterol levels elevated, start lifestyle modifications and follow diet low in saturated fat.  Discussed Diet to Lower Cholesterol Eat More: Oats, beans, and lentils: High in soluble fiber. Fatty fish: Salmon, tuna (rich in omega-3s). Nuts and seeds: Almonds, walnuts, flaxseeds. Fruits and vegetables: Apples, berries, leafy greens. Healthy fats: Olive oil, avocado. Limit: Saturated fats: Butter, cream, fatty meats. Trans fats: Fried foods, processed snacks. Sugar and refined carbs: Sweets, white bread. Focus on whole foods, healthy fats, and fiber to improve heart health! Maintain an exercise routine 3 to 5 days a week for a minimum total of 150 minutes.

## 2024-03-22 NOTE — Progress Notes (Signed)
 Established Patient Office Visit   Subjective  Patient ID: Albert Mcdonald, male    DOB: 26-Aug-1971  Age: 53 y.o. MRN: [id number]  Chief Complaint  Patient presents with   Diabetes    Three month follow up   Hypertension    Three month follow up    He  has a past medical history of ALS (amyotrophic lateral sclerosis) (HCC), Diabetes mellitus, Family history of anesthesia complication, Hyperlipidemia, Hypertension, Kleine-Levin syndrome, OCD (obsessive compulsive disorder), and Restless leg.  HPI Patient presents to the clinic for hypertension follow up. For the details of today's visit, please refer to assessment and plan.   Review of Systems  Constitutional:  Negative for chills and fever.  Respiratory:  Negative for shortness of breath.   Cardiovascular:  Negative for chest pain.  Genitourinary:  Negative for dysuria.  Neurological:  Negative for dizziness and headaches.      Objective:     BP 135/86   Pulse 100   Ht 6' 1 (1.854 m)   Wt 218 lb (98.9 kg)   SpO2 96%   BMI 28.76 kg/m  BP Readings from Last 3 Encounters:  03/22/24 135/86  01/20/24 129/86  12/23/23 128/84      Physical Exam Vitals reviewed.  Constitutional:      General: He is not in acute distress.    Appearance: Normal appearance. He is not ill-appearing, toxic-appearing or diaphoretic.  HENT:     Head: Normocephalic.   Eyes:     General:        Right eye: No discharge.        Left eye: No discharge.     Conjunctiva/sclera: Conjunctivae normal.    Cardiovascular:     Rate and Rhythm: Normal rate.     Pulses: Normal pulses.     Heart sounds: Normal heart sounds.  Pulmonary:     Effort: Pulmonary effort is normal. No respiratory distress.     Breath sounds: Normal breath sounds.  Abdominal:     General: Bowel sounds are normal.   Musculoskeletal:     Cervical back: Normal range of motion.   Skin:    General: Skin is warm and dry.   Neurological:     Mental Status: He is  alert.   Psychiatric:        Mood and Affect: Mood normal.        Behavior: Behavior normal.      No results found for any visits on 03/22/24.  The 10-year ASCVD risk score (Arnett DK, et al., 2019) is: 13.7%    Assessment & Plan:  Primary hypertension -     Lipid panel -     CMP14+EGFR -     CBC with Differential/Platelet  Vitamin D  deficiency -     VITAMIN D  25 Hydroxy (Vit-D Deficiency, Fractures)  Mixed hyperlipidemia Assessment & Plan: Previous LDL 128 Patient reports taking Crestor  40 mg once daily Repeat Lipid panel done today  Cholesterol levels elevated, start lifestyle modifications and follow diet low in saturated fat.  Discussed Diet to Lower Cholesterol Eat More: Oats, beans, and lentils: High in soluble fiber. Fatty fish: Salmon, tuna (rich in omega-3s). Nuts and seeds: Almonds, walnuts, flaxseeds. Fruits and vegetables: Apples, berries, leafy greens. Healthy fats: Olive oil, avocado. Limit: Saturated fats: Butter, cream, fatty meats. Trans fats: Fried foods, processed snacks. Sugar and refined carbs: Sweets, white bread. Focus on whole foods, healthy fats, and fiber to improve heart health! Maintain an exercise routine  3 to 5 days a week for a minimum total of 150 minutes.    Essential hypertension Assessment & Plan: Vitals:   03/22/24 0820 03/22/24 0840  BP: (!) 130/90 135/86  Continue Amlodipine  5 mg once daily Labs ordered. Discussed with  patient to monitor their blood pressure regularly and maintain a heart-healthy diet rich in fruits, vegetables, whole grains, and low-fat dairy, while reducing sodium intake to less than 2,300 mg per day. Regular physical activity, such as 30 minutes of moderate exercise most days of the week, will help lower blood pressure and improve overall cardiovascular health. Avoiding smoking, limiting alcohol consumption, and managing stress. Take  prescribed medication, & take it as directed and avoid skipping doses.  Seek emergency care if your blood pressure is (over 180/100) or you experience chest pain, shortness of breath, or sudden vision changes.Patient verbalizes understanding regarding plan of care and all questions answered.   Other orders -     Lantus  SoloStar; Inject 20 Units into the skin at bedtime.  Dispense: 21 mL; Refill: 3    Return in about 4 months (around 07/22/2024), or if symptoms worsen or fail to improve, for hypertension, hyperlipidemia.   Hilario Kidd Wilhelmena Falter, FNP

## 2024-03-22 NOTE — Progress Notes (Signed)
 Arrived 03/22/2024 and has given verbal consent to obtain images and complete their overdue diabetic retinal screening.  The images have been sent to an ophthalmologist or optometrist for review and interpretation.  Results will be sent back to [facility] for review.  Patient has been informed they will be contacted when we receive the results via telephone or MyChart.

## 2024-03-22 NOTE — Patient Instructions (Signed)

## 2024-03-22 NOTE — Telephone Encounter (Signed)
 Patient has a question regarding great toe draining and a small amount of blood was on his bandage. Patient stated he has an appointment coming up, he just was concerned. [phone]

## 2024-03-23 ENCOUNTER — Ambulatory Visit: Payer: Self-pay | Admitting: Family Medicine

## 2024-03-23 LAB — CMP14+EGFR
ALT: 20 IU/L (ref 0–44)
AST: 15 IU/L (ref 0–40)
Albumin: 5 g/dL — ABNORMAL HIGH (ref 3.8–4.9)
Alkaline Phosphatase: 155 IU/L — ABNORMAL HIGH (ref 44–121)
BUN/Creatinine Ratio: 23 — ABNORMAL HIGH (ref 9–20)
BUN: 20 mg/dL (ref 6–24)
Bilirubin Total: 0.5 mg/dL (ref 0.0–1.2)
CO2: 20 mmol/L (ref 20–29)
Calcium: 10.7 mg/dL — ABNORMAL HIGH (ref 8.7–10.2)
Chloride: 94 mmol/L — ABNORMAL LOW (ref 96–106)
Creatinine, Ser: 0.87 mg/dL (ref 0.76–1.27)
Globulin, Total: 2.9 g/dL (ref 1.5–4.5)
Glucose: 379 mg/dL — ABNORMAL HIGH (ref 70–99)
Potassium: 4.9 mmol/L (ref 3.5–5.2)
Sodium: 138 mmol/L (ref 134–144)
Total Protein: 7.9 g/dL (ref 6.0–8.5)
eGFR: 104 mL/min/{1.73_m2} (ref >59)

## 2024-03-23 LAB — LIPID PANEL
Chol/HDL Ratio: 3.5 ratio (ref 0.0–5.0)
Cholesterol, Total: 166 mg/dL (ref 100–199)
HDL: 48 mg/dL (ref >39)
LDL Chol Calc (NIH): 74 mg/dL (ref 0–99)
Triglycerides: 274 mg/dL — ABNORMAL HIGH (ref 0–149)
VLDL Cholesterol Cal: 44 mg/dL — ABNORMAL HIGH (ref 5–40)

## 2024-03-23 LAB — CBC WITH DIFFERENTIAL/PLATELET
Basophils Absolute: 0.1 10*3/uL (ref 0.0–0.2)
Basos: 1 %
EOS (ABSOLUTE): 0.1 10*3/uL (ref 0.0–0.4)
Eos: 1 %
Hematocrit: 53.7 % — ABNORMAL HIGH (ref 37.5–51.0)
Hemoglobin: 17.5 g/dL (ref 13.0–17.7)
Immature Grans (Abs): 0 10*3/uL (ref 0.0–0.1)
Immature Granulocytes: 0 %
Lymphocytes Absolute: 2.6 10*3/uL (ref 0.7–3.1)
Lymphs: 26 %
MCH: 28.6 pg (ref 26.6–33.0)
MCHC: 32.6 g/dL (ref 31.5–35.7)
MCV: 88 fL (ref 79–97)
Monocytes Absolute: 0.6 10*3/uL (ref 0.1–0.9)
Monocytes: 6 %
Neutrophils Absolute: 6.6 10*3/uL (ref 1.4–7.0)
Neutrophils: 66 %
Platelets: 197 10*3/uL (ref 150–450)
RBC: 6.11 x10E6/uL — ABNORMAL HIGH (ref 4.14–5.80)
RDW: 13.2 % (ref 11.6–15.4)
WBC: 10 10*3/uL (ref 3.4–10.8)

## 2024-03-23 LAB — VITAMIN D 25 HYDROXY (VIT D DEFICIENCY, FRACTURES): Vit D, 25-Hydroxy: 33.4 ng/mL (ref 30.0–100.0)

## 2024-03-25 DIAGNOSIS — S91102A Unspecified open wound of left great toe without damage to nail, initial encounter: Secondary | ICD-10-CM | POA: Diagnosis not present

## 2024-03-28 DIAGNOSIS — Z794 Long term (current) use of insulin: Secondary | ICD-10-CM | POA: Diagnosis not present

## 2024-03-28 DIAGNOSIS — Z981 Arthrodesis status: Secondary | ICD-10-CM | POA: Diagnosis not present

## 2024-03-28 DIAGNOSIS — G2581 Restless legs syndrome: Secondary | ICD-10-CM | POA: Diagnosis not present

## 2024-03-28 DIAGNOSIS — Z89421 Acquired absence of other right toe(s): Secondary | ICD-10-CM | POA: Diagnosis not present

## 2024-03-28 DIAGNOSIS — Z7985 Long-term (current) use of injectable non-insulin antidiabetic drugs: Secondary | ICD-10-CM | POA: Diagnosis not present

## 2024-03-28 DIAGNOSIS — E1140 Type 2 diabetes mellitus with diabetic neuropathy, unspecified: Secondary | ICD-10-CM | POA: Diagnosis not present

## 2024-03-28 DIAGNOSIS — Z89422 Acquired absence of other left toe(s): Secondary | ICD-10-CM | POA: Diagnosis not present

## 2024-03-28 DIAGNOSIS — L03032 Cellulitis of left toe: Secondary | ICD-10-CM | POA: Diagnosis not present

## 2024-03-28 DIAGNOSIS — G1221 Amyotrophic lateral sclerosis: Secondary | ICD-10-CM | POA: Diagnosis not present

## 2024-03-28 DIAGNOSIS — E785 Hyperlipidemia, unspecified: Secondary | ICD-10-CM | POA: Diagnosis not present

## 2024-03-28 DIAGNOSIS — G4713 Recurrent hypersomnia: Secondary | ICD-10-CM | POA: Diagnosis not present

## 2024-03-28 DIAGNOSIS — L02612 Cutaneous abscess of left foot: Secondary | ICD-10-CM | POA: Diagnosis not present

## 2024-03-28 DIAGNOSIS — Z7984 Long term (current) use of oral hypoglycemic drugs: Secondary | ICD-10-CM | POA: Diagnosis not present

## 2024-03-28 DIAGNOSIS — I10 Essential (primary) hypertension: Secondary | ICD-10-CM | POA: Diagnosis not present

## 2024-03-28 DIAGNOSIS — Z9181 History of falling: Secondary | ICD-10-CM | POA: Diagnosis not present

## 2024-03-29 ENCOUNTER — Ambulatory Visit: Admitting: Nurse Practitioner

## 2024-03-29 DIAGNOSIS — I10 Essential (primary) hypertension: Secondary | ICD-10-CM

## 2024-03-29 DIAGNOSIS — E559 Vitamin D deficiency, unspecified: Secondary | ICD-10-CM

## 2024-03-29 DIAGNOSIS — E118 Type 2 diabetes mellitus with unspecified complications: Secondary | ICD-10-CM

## 2024-03-29 DIAGNOSIS — Z7984 Long term (current) use of oral hypoglycemic drugs: Secondary | ICD-10-CM

## 2024-03-29 DIAGNOSIS — Z7985 Long-term (current) use of injectable non-insulin antidiabetic drugs: Secondary | ICD-10-CM

## 2024-03-29 DIAGNOSIS — Z794 Long term (current) use of insulin: Secondary | ICD-10-CM

## 2024-03-29 DIAGNOSIS — E782 Mixed hyperlipidemia: Secondary | ICD-10-CM

## 2024-04-04 DIAGNOSIS — E1140 Type 2 diabetes mellitus with diabetic neuropathy, unspecified: Secondary | ICD-10-CM | POA: Diagnosis not present

## 2024-04-04 DIAGNOSIS — Z7984 Long term (current) use of oral hypoglycemic drugs: Secondary | ICD-10-CM | POA: Diagnosis not present

## 2024-04-04 DIAGNOSIS — Z9181 History of falling: Secondary | ICD-10-CM | POA: Diagnosis not present

## 2024-04-04 DIAGNOSIS — G2581 Restless legs syndrome: Secondary | ICD-10-CM | POA: Diagnosis not present

## 2024-04-04 DIAGNOSIS — I10 Essential (primary) hypertension: Secondary | ICD-10-CM | POA: Diagnosis not present

## 2024-04-04 DIAGNOSIS — Z89421 Acquired absence of other right toe(s): Secondary | ICD-10-CM | POA: Diagnosis not present

## 2024-04-04 DIAGNOSIS — E785 Hyperlipidemia, unspecified: Secondary | ICD-10-CM | POA: Diagnosis not present

## 2024-04-04 DIAGNOSIS — Z7985 Long-term (current) use of injectable non-insulin antidiabetic drugs: Secondary | ICD-10-CM | POA: Diagnosis not present

## 2024-04-04 DIAGNOSIS — Z794 Long term (current) use of insulin: Secondary | ICD-10-CM | POA: Diagnosis not present

## 2024-04-04 DIAGNOSIS — G4713 Recurrent hypersomnia: Secondary | ICD-10-CM | POA: Diagnosis not present

## 2024-04-04 DIAGNOSIS — G1221 Amyotrophic lateral sclerosis: Secondary | ICD-10-CM | POA: Diagnosis not present

## 2024-04-04 DIAGNOSIS — L02612 Cutaneous abscess of left foot: Secondary | ICD-10-CM | POA: Diagnosis not present

## 2024-04-04 DIAGNOSIS — Z981 Arthrodesis status: Secondary | ICD-10-CM | POA: Diagnosis not present

## 2024-04-04 DIAGNOSIS — L03032 Cellulitis of left toe: Secondary | ICD-10-CM | POA: Diagnosis not present

## 2024-04-04 DIAGNOSIS — Z89422 Acquired absence of other left toe(s): Secondary | ICD-10-CM | POA: Diagnosis not present

## 2024-04-06 ENCOUNTER — Emergency Department (HOSPITAL_COMMUNITY)

## 2024-04-06 ENCOUNTER — Encounter (HOSPITAL_COMMUNITY): Payer: Self-pay

## 2024-04-06 ENCOUNTER — Encounter: Payer: Self-pay | Admitting: Podiatry

## 2024-04-06 ENCOUNTER — Ambulatory Visit (INDEPENDENT_AMBULATORY_CARE_PROVIDER_SITE_OTHER): Admitting: Podiatry

## 2024-04-06 ENCOUNTER — Other Ambulatory Visit: Payer: Self-pay

## 2024-04-06 ENCOUNTER — Ambulatory Visit (INDEPENDENT_AMBULATORY_CARE_PROVIDER_SITE_OTHER)

## 2024-04-06 ENCOUNTER — Inpatient Hospital Stay (HOSPITAL_COMMUNITY)
Admission: EM | Admit: 2024-04-06 | Discharge: 2024-04-10 | DRG: 617 | Disposition: A | Discharge status: Home / Self Care | Source: Ambulatory Visit | Attending: Internal Medicine | Admitting: Internal Medicine

## 2024-04-06 DIAGNOSIS — F32A Depression, unspecified: Secondary | ICD-10-CM | POA: Diagnosis present

## 2024-04-06 DIAGNOSIS — Z981 Arthrodesis status: Secondary | ICD-10-CM

## 2024-04-06 DIAGNOSIS — L570 Actinic keratosis: Secondary | ICD-10-CM | POA: Diagnosis present

## 2024-04-06 DIAGNOSIS — E872 Acidosis, unspecified: Secondary | ICD-10-CM | POA: Diagnosis present

## 2024-04-06 DIAGNOSIS — M86472 Chronic osteomyelitis with draining sinus, left ankle and foot: Secondary | ICD-10-CM | POA: Diagnosis not present

## 2024-04-06 DIAGNOSIS — E785 Hyperlipidemia, unspecified: Secondary | ICD-10-CM | POA: Diagnosis present

## 2024-04-06 DIAGNOSIS — M86172 Other acute osteomyelitis, left ankle and foot: Secondary | ICD-10-CM

## 2024-04-06 DIAGNOSIS — M87172 Osteonecrosis due to drugs, left ankle: Secondary | ICD-10-CM | POA: Diagnosis not present

## 2024-04-06 DIAGNOSIS — E1169 Type 2 diabetes mellitus with other specified complication: Secondary | ICD-10-CM | POA: Diagnosis present

## 2024-04-06 DIAGNOSIS — G4713 Recurrent hypersomnia: Secondary | ICD-10-CM | POA: Diagnosis present

## 2024-04-06 DIAGNOSIS — L97526 Non-pressure chronic ulcer of other part of left foot with bone involvement without evidence of necrosis: Secondary | ICD-10-CM | POA: Diagnosis present

## 2024-04-06 DIAGNOSIS — Z833 Family history of diabetes mellitus: Secondary | ICD-10-CM

## 2024-04-06 DIAGNOSIS — M869 Osteomyelitis, unspecified: Secondary | ICD-10-CM | POA: Diagnosis present

## 2024-04-06 DIAGNOSIS — L03032 Cellulitis of left toe: Secondary | ICD-10-CM | POA: Diagnosis not present

## 2024-04-06 DIAGNOSIS — Z794 Long term (current) use of insulin: Secondary | ICD-10-CM | POA: Diagnosis not present

## 2024-04-06 DIAGNOSIS — E11621 Type 2 diabetes mellitus with foot ulcer: Secondary | ICD-10-CM | POA: Diagnosis present

## 2024-04-06 DIAGNOSIS — L97524 Non-pressure chronic ulcer of other part of left foot with necrosis of bone: Secondary | ICD-10-CM

## 2024-04-06 DIAGNOSIS — E1140 Type 2 diabetes mellitus with diabetic neuropathy, unspecified: Secondary | ICD-10-CM | POA: Diagnosis present

## 2024-04-06 DIAGNOSIS — Z79899 Other long term (current) drug therapy: Secondary | ICD-10-CM

## 2024-04-06 DIAGNOSIS — I10 Essential (primary) hypertension: Secondary | ICD-10-CM | POA: Diagnosis present

## 2024-04-06 DIAGNOSIS — R7982 Elevated C-reactive protein (CRP): Secondary | ICD-10-CM | POA: Diagnosis present

## 2024-04-06 DIAGNOSIS — Z89422 Acquired absence of other left toe(s): Secondary | ICD-10-CM

## 2024-04-06 DIAGNOSIS — E1165 Type 2 diabetes mellitus with hyperglycemia: Secondary | ICD-10-CM | POA: Diagnosis present

## 2024-04-06 DIAGNOSIS — Z888 Allergy status to other drugs, medicaments and biological substances status: Secondary | ICD-10-CM

## 2024-04-06 DIAGNOSIS — G2581 Restless legs syndrome: Secondary | ICD-10-CM | POA: Diagnosis present

## 2024-04-06 DIAGNOSIS — Z89431 Acquired absence of right foot: Secondary | ICD-10-CM | POA: Diagnosis not present

## 2024-04-06 DIAGNOSIS — M7989 Other specified soft tissue disorders: Secondary | ICD-10-CM | POA: Diagnosis not present

## 2024-04-06 DIAGNOSIS — M65972 Unspecified synovitis and tenosynovitis, left ankle and foot: Secondary | ICD-10-CM | POA: Diagnosis not present

## 2024-04-06 LAB — CBC WITH DIFFERENTIAL/PLATELET
Abs Immature Granulocytes: 0.08 K/uL — ABNORMAL HIGH (ref 0.00–0.07)
Basophils Absolute: 0.1 K/uL (ref 0.0–0.1)
Basophils Relative: 1 %
Eosinophils Absolute: 0.1 K/uL (ref 0.0–0.5)
Eosinophils Relative: 1 %
HCT: 47.1 % (ref 39.0–52.0)
Hemoglobin: 16.2 g/dL (ref 13.0–17.0)
Immature Granulocytes: 1 %
Lymphocytes Relative: 21 %
Lymphs Abs: 2.4 K/uL (ref 0.7–4.0)
MCH: 28.3 pg (ref 26.0–34.0)
MCHC: 34.4 g/dL (ref 30.0–36.0)
MCV: 82.3 fL (ref 80.0–100.0)
Monocytes Absolute: 0.9 K/uL (ref 0.1–1.0)
Monocytes Relative: 8 %
Neutro Abs: 7.5 K/uL (ref 1.7–7.7)
Neutrophils Relative %: 68 %
Platelets: 209 K/uL (ref 150–400)
RBC: 5.72 MIL/uL (ref 4.22–5.81)
RDW: 12.1 % (ref 11.5–15.5)
WBC: 11 K/uL — ABNORMAL HIGH (ref 4.0–10.5)
nRBC: 0 % (ref 0.0–0.2)

## 2024-04-06 LAB — URINALYSIS, W/ REFLEX TO CULTURE (INFECTION SUSPECTED)
Bilirubin Urine: NEGATIVE
Glucose, UA: >=500 mg/dL — AB
Hgb urine dipstick: NEGATIVE
Ketones, ur: 5 mg/dL — AB
Leukocytes,Ua: NEGATIVE
Nitrite: NEGATIVE
Protein, ur: NEGATIVE mg/dL
Specific Gravity, Urine: >1.046 — ABNORMAL HIGH (ref 1.005–1.030)
pH: 5 (ref 5.0–8.0)

## 2024-04-06 LAB — I-STAT CG4 LACTIC ACID, ED
Lactic Acid, Venous: 2.1 mmol/L (ref 0.5–1.9)
Lactic Acid, Venous: 1.2 mmol/L (ref 0.5–1.9)

## 2024-04-06 LAB — GLUCOSE, CAPILLARY
Glucose-Capillary: 283 mg/dL — ABNORMAL HIGH (ref 70–99)
Glucose-Capillary: 304 mg/dL — ABNORMAL HIGH (ref 70–99)
Glucose-Capillary: 205 mg/dL — ABNORMAL HIGH (ref 70–99)

## 2024-04-06 LAB — COMPREHENSIVE METABOLIC PANEL WITH GFR
ALT: 15 U/L (ref 0–44)
AST: 14 U/L — ABNORMAL LOW (ref 15–41)
Albumin: 3.8 g/dL (ref 3.5–5.0)
Alkaline Phosphatase: 103 U/L (ref 38–126)
Anion gap: 12 (ref 5–15)
BUN: 15 mg/dL (ref 6–20)
CO2: 24 mmol/L (ref 22–32)
Calcium: 9.4 mg/dL (ref 8.9–10.3)
Chloride: 96 mmol/L — ABNORMAL LOW (ref 98–111)
Creatinine, Ser: 0.8 mg/dL (ref 0.61–1.24)
GFR, Estimated: >60 mL/min (ref >60)
Glucose, Bld: 394 mg/dL — ABNORMAL HIGH (ref 70–99)
Potassium: 4.4 mmol/L (ref 3.5–5.1)
Sodium: 132 mmol/L — ABNORMAL LOW (ref 135–145)
Total Bilirubin: 0.8 mg/dL (ref 0.0–1.2)
Total Protein: 7.7 g/dL (ref 6.5–8.1)

## 2024-04-06 LAB — C-REACTIVE PROTEIN: CRP: 6.6 mg/dL — ABNORMAL HIGH (ref <1.0)

## 2024-04-06 LAB — SEDIMENTATION RATE: Sed Rate: 32 mm/h — ABNORMAL HIGH (ref 0–16)

## 2024-04-06 LAB — HEMOGLOBIN A1C
Hgb A1c MFr Bld: 10.6 % — ABNORMAL HIGH (ref 4.8–5.6)
Mean Plasma Glucose: 258 mg/dL

## 2024-04-06 MED ORDER — ROSUVASTATIN CALCIUM 20 MG PO TABS
40.0000 mg | ORAL_TABLET | Freq: Every day | ORAL | Status: DC
  Administered 2024-04-06 – 2024-04-10 (×5): 40 mg via ORAL
  Filled 2024-04-06 (×5): qty 2

## 2024-04-06 MED ORDER — INSULIN ASPART 100 UNIT/ML IJ SOLN
10.0000 [IU] | Freq: Once | INTRAMUSCULAR | Status: AC
  Administered 2024-04-06: 10 [IU] via SUBCUTANEOUS

## 2024-04-06 MED ORDER — INSULIN ASPART 100 UNIT/ML IJ SOLN
0.0000 [IU] | Freq: Three times a day (TID) | INTRAMUSCULAR | Status: DC
  Administered 2024-04-06: 11 [IU] via SUBCUTANEOUS
  Administered 2024-04-07: 3 [IU] via SUBCUTANEOUS
  Administered 2024-04-07: 5 [IU] via SUBCUTANEOUS
  Administered 2024-04-07: 8 [IU] via SUBCUTANEOUS
  Administered 2024-04-08: 3 [IU] via SUBCUTANEOUS
  Administered 2024-04-08: 8 [IU] via SUBCUTANEOUS
  Administered 2024-04-09: 3 [IU] via SUBCUTANEOUS
  Administered 2024-04-09 – 2024-04-10 (×2): 5 [IU] via SUBCUTANEOUS
  Administered 2024-04-10: 8 [IU] via SUBCUTANEOUS
  Filled 2024-04-06: qty 1

## 2024-04-06 MED ORDER — SODIUM CHLORIDE 0.9 % IV SOLN
2.0000 g | Freq: Once | INTRAVENOUS | Status: AC
  Administered 2024-04-06: 2 g via INTRAVENOUS
  Filled 2024-04-06: qty 12.5

## 2024-04-06 MED ORDER — GABAPENTIN 300 MG PO CAPS
300.0000 mg | ORAL_CAPSULE | Freq: Two times a day (BID) | ORAL | Status: DC
  Administered 2024-04-06 – 2024-04-10 (×8): 300 mg via ORAL
  Filled 2024-04-06 (×8): qty 1

## 2024-04-06 MED ORDER — VANCOMYCIN HCL 2000 MG/400ML IV SOLN
2000.0000 mg | Freq: Once | INTRAVENOUS | Status: AC
  Administered 2024-04-06: 2000 mg via INTRAVENOUS
  Filled 2024-04-06: qty 400

## 2024-04-06 MED ORDER — INSULIN ASPART 100 UNIT/ML IJ SOLN
0.0000 [IU] | Freq: Every day | INTRAMUSCULAR | Status: DC
  Administered 2024-04-06 – 2024-04-08 (×3): 2 [IU] via SUBCUTANEOUS

## 2024-04-06 MED ORDER — LORAZEPAM 0.5 MG PO TABS: 0.5000 mg | ORAL_TABLET | Freq: Four times a day (QID) | ORAL | Status: DC | PRN

## 2024-04-06 MED ORDER — SODIUM CHLORIDE 0.9 % IV SOLN
3.0000 g | Freq: Four times a day (QID) | INTRAVENOUS | Status: DC
  Administered 2024-04-06 – 2024-04-09 (×11): 3 g via INTRAVENOUS
  Filled 2024-04-06 (×11): qty 8

## 2024-04-06 MED ORDER — AMLODIPINE BESYLATE 5 MG PO TABS
5.0000 mg | ORAL_TABLET | Freq: Every day | ORAL | Status: DC
  Administered 2024-04-06 – 2024-04-10 (×5): 5 mg via ORAL
  Filled 2024-04-06 (×5): qty 1

## 2024-04-06 MED ORDER — INSULIN GLARGINE-YFGN 100 UNIT/ML ~~LOC~~ SOLN
20.0000 [IU] | Freq: Every day | SUBCUTANEOUS | Status: DC
  Administered 2024-04-06: 20 [IU] via SUBCUTANEOUS
  Filled 2024-04-06 (×2): qty 0.2

## 2024-04-06 MED ORDER — LINEZOLID 600 MG/300ML IV SOLN
600.0000 mg | Freq: Two times a day (BID) | INTRAVENOUS | Status: DC
  Administered 2024-04-06 – 2024-04-09 (×6): 600 mg via INTRAVENOUS
  Filled 2024-04-06 (×7): qty 300

## 2024-04-06 NOTE — Consult Note (Signed)
 PODIATRY CONSULTATION  NAME Albert Mcdonald MRN [id number] DOB 11-Dec-1970 DOA 04/06/2024   Reason for consult:  Chief Complaint  Patient presents with   Osteomyelitis     Attending/Consulting physician: Dr. Ula MD  History of present illness: 53 y.o. male with history DM2 with neuropathy with prior bilateral foot digit amputations, right partial 5th ray and left 2nd and 3rd toe amputations who was sent in to the ED this AM by Dr. Lamount from our office due to concern for worsening Left hallux ulceration and concern for underlying osteomyelitis. Pt reports wound has been worsening for a few weeks. Recent increase in redness swelling and drainage. Does report neuropathy. He reports he ate this am 1-2 hr ago. We discussed plan for imagine and antibiotics and need for amputation this admission possibly just the left great toe vs trans metatarsal amputation. He is aware of plans as is his sister who was bedside.   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 ALS   Diabetes mellitus    Family history of anesthesia complication    Hyperlipidemia    Hypertension    Kleine-Levin syndrome    OCD (obsessive compulsive disorder)    Restless leg        Latest Ref Rng & Units 03/22/2024    8:46 AM 12/09/2023    3:02 PM 10/26/2023    4:42 AM  CBC  WBC 3.4 - 10.8 x10E3/uL 10.0  7.5  7.6   Hemoglobin 13.0 - 17.7 g/dL 82.4  84.0  84.9   Hematocrit 37.5 - 51.0 % 53.7  47.3  41.7   Platelets 150 - 450 x10E3/uL 197  179  166        Latest Ref Rng & Units 03/22/2024    8:46 AM 12/09/2023    3:02 PM 10/26/2023    4:42 AM  BMP  Glucose 70 - 99 mg/dL 620  680  832   BUN 6 - 24 mg/dL 20  12  21    Creatinine 0.76 - 1.27 mg/dL 9.12  9.24  9.31   BUN/Creat Ratio 9 - 20 23  16     Sodium 134 - 144 mmol/L 138  140  137   Potassium 3.5 - 5.2 mmol/L 4.9  4.3  3.6   Chloride 96 - 106 mmol/L 94  100  102   CO2 20 - 29  mmol/L 20  23  26    Calcium  8.7 - 10.2 mg/dL 89.2  9.6  9.0       Physical Exam: Lower Extremity Exam Right foot no open wounds, right partial 5th ray amputation is well healed  Left foot  Palpable DP and PT pulse 2+ Left hallux is wrapped but from pictures there is significant erythema and edema, plantar hallux ulceration with macerated tissue surrounding       Hyperkeratotic lesion at the left 4th toe distal tuft with some erythema and edema  Prior L 2nd and 3rd toe amputation well healed    ASSESSMENT/PLAN OF CARE 53 y.o. male with PMHx significant for  DM2 with neruopathy, HLD, HTN with ulceration left hallux with underlying osteomyelitis and pre ulcerative lesion to the left 4th toe.  XR L foot: attention to distal phalanx of hallux early osteolysis and erosions of the distal tuft of the bone concerning for osteomyelitis  WBC p MRI L foot: pending   - Patient will require surgical intervention  L hallux amputation vs L foot transmetatarsal amputation given pre ulcerative lesion to 4th toe and prior amp of 2nd and 3rd toe. MRI ordered. Will discuss with Dr. Silva who is on call for us  this weekend. Possible OR tomorrow vs Monday. - Recommend IV abx broad spectrum pending further culture data - Anticoagulation: hold pending OR - Wound care: Betadine gauze dressing to left hallux - WB status: WBAT pre op - Will continue to follow   Thank you for the consult.  Please contact me directly with any questions or concerns.           Marolyn JULIANNA Honour, DPM [facility]    [address] [place], [place] [address]                Office [phone]  Fax [phone]

## 2024-04-06 NOTE — H&P (Addendum)
 History and Physical    Patient: Albert Mcdonald FMW:[id number] DOB: [birth year] DOA: 04/06/2024 DOS: the patient was seen and examined on 04/06/2024 PCP: Del Wilhelmena Lloyd Sola, FNP  Patient coming from: Home  Chief Complaint:  Chief Complaint  Patient presents with   Osteomyelitis    HPI: Albert Mcdonald is a 53 y.o. 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  Pt was in his USOH until OP evaluation at his Podiatrist's office at 0730 revealed LLE 1st digit OM for pt was advised to present to the ED. Pt denies any f/c or pain in his LLE.   In the ED, pt hypertensive. Labs notable for lactic acid 2.1-->1.2, WBC 11, and glucose 379. MR LLE showed acute OM of L great tow. Pt admitted to medicine with podiatry consulted.  Review of Systems: As mentioned in the history of present illness. All other systems reviewed and are negative. 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 ALS   Diabetes mellitus    Family history of anesthesia complication    Hyperlipidemia    Hypertension    Kleine-Levin syndrome    OCD (obsessive compulsive disorder)    Restless leg    Past Surgical History:  Procedure Laterality Date   AMPUTATION Right 02/26/2022   Procedure: AMPUTATION 5th TOE;  Surgeon: Harden Jerona GAILS, MD;  Location: [facility] OR;  Service: Orthopedics;  Laterality: Right;   AMPUTATION TOE Left 07/30/2022   Procedure: AMPUTATION TOE, left third toe;  Surgeon: Kallie Manuelita BROCKS, MD;  Location: [facility] ORS;  Service: General;  Laterality: Left;   AMPUTATION TOE Left 11/01/2022   Procedure: AMPUTATION TOE, LEFT SECOND;  Surgeon: Kallie Manuelita BROCKS, MD;  Location: [facility] ORS;  Service: General;  Laterality: Left;   ANTERIOR CERVICAL DECOMP/DISCECTOMY FUSION  11/09/2012   Procedure: ANTERIOR CERVICAL DECOMPRESSION/DISCECTOMY FUSION 3 LEVELS C4-C7 ;  Surgeon: Donaciano Sprang, MD;  Location: [facility] OR;  Service: Orthopedics;;  ACDF C4-C7    Social History:  reports that he has never smoked. He has never used smokeless tobacco. He reports that he does not currently use alcohol. He reports that he does not use drugs.  Allergies  Allergen Reactions   Ace Inhibitors Rash    Tolerates lisinopril     Family History  Problem Relation Age of Onset   Diabetes Mother    Cancer - Colon Neg Hx    Colon polyps Neg Hx     Prior to Admission medications   Medication Sig Start Date End Date Taking? Authorizing Provider  amLODipine  (NORVASC ) 5 MG tablet Take 1 tablet (5 mg total) by mouth daily. 12/09/23 12/08/24 Yes Del Wilhelmena Lloyd Sola, FNP  ammonium lactate  (AMLACTIN DAILY) 12 % lotion Apply 1 Application topically as needed for dry skin. 12/02/23  Yes Semon, Jake L, DPM  gabapentin  (NEURONTIN ) 300 MG capsule Take 1 capsule (300 mg total) by mouth 2 (two) times daily. 12/09/23  Yes Del Orbe Polanco, Sola, FNP  insulin  glargine (LANTUS  SOLOSTAR) 100 UNIT/ML Solostar Pen Inject 20 Units into the skin at bedtime. Patient taking differently: Inject 20-26 Units into the skin at bedtime. 03/22/24  Yes Del Orbe Polanco, Sola, FNP  rosuvastatin  (CRESTOR ) 40 MG tablet Take 1 tablet (40 mg total) by mouth daily. 12/14/23  Yes Del Wilhelmena Lloyd, Sola, FNP  silver  sulfADIAZINE  (SILVADENE ) 1 % cream Apply 1  Application topically daily. Patient taking differently: Apply 1 Application topically daily as needed (skin irritation). 03/09/24  Yes Semon, Jake L, DPM  Vitamin D , Ergocalciferol , (DRISDOL ) 1.25 MG (50000 UNIT) CAPS capsule Take 1 capsule (50,000 Units total) by mouth every 7 (seven) days. 01/09/24  Yes Therisa Benton PARAS, NP  Continuous Glucose Receiver (FREESTYLE LIBRE 2 READER) DEVI Use to check blood sugar daily dx e11.65 12/09/23   Del Wilhelmena Lloyd Sola, FNP  Continuous Glucose Sensor (FREESTYLE LIBRE 2 PLUS SENSOR) MISC 1 each by Does not apply route daily. 12/09/23    Del Wilhelmena Lloyd Sola, FNP  Insulin  Pen Needle 32G X 4 MM MISC Use to inject insulin  once daily 12/23/23   Therisa Benton PARAS, NP    Physical Exam: Vitals:   04/06/24 9076 04/06/24 0924 04/06/24 1230 04/06/24 1308  BP: (!) 152/104  (!) 139/105 (!) 149/92  Pulse: 92  91 76  Resp: 17  18 19   Temp: 98.3 F (36.8 C)  98.1 F (36.7 C) 97.9 F (36.6 C)  TempSrc: Oral     SpO2: 100%  95% 99%  Weight:  99.8 kg    Height:  6' 1 (1.854 m)     General: Alert, oriented x3, resting comfortably in no acute distress Respiratory: Lungs clear to auscultation bilaterally with normal respiratory effort; no w/r/r Cardiovascular: Regular rate and rhythm w/o m/r/g   Data Reviewed:  Lab Results  Component Value Date   WBC 11.0 (H) 04/06/2024   HGB 16.2 04/06/2024   HCT 47.1 04/06/2024   MCV 82.3 04/06/2024   PLT 209 04/06/2024   Lab Results  Component Value Date   GLUCOSE 394 (H) 04/06/2024   CALCIUM  9.4 04/06/2024   NA 132 (L) 04/06/2024   K 4.4 04/06/2024   CO2 24 04/06/2024   CL 96 (L) 04/06/2024   BUN 15 04/06/2024   CREATININE 0.80 04/06/2024   Lab Results  Component Value Date   ALT 15 04/06/2024   AST 14 (L) 04/06/2024   ALKPHOS 103 04/06/2024   BILITOT 0.8 04/06/2024   Lab Results  Component Value Date   INR 0.9 02/25/2022    Radiology: MR FOOT LEFT WO CONTRAST Result Date: 04/06/2024 CLINICAL DATA:  Diabetic foot ulcer of the great toe. Concern for osteomyelitis. EXAM: MRI OF THE LEFT FOOT WITHOUT CONTRAST TECHNIQUE: Multiplanar, multisequence MR imaging of the left forefoot was performed. No intravenous contrast was administered. COMPARISON:  Same day radiographs of the left foot. MRI of the left foot dated 10/25/2023. FINDINGS: Bones/Joint/Cartilage T2/STIR hyperintense marrow signal abnormality with confluent T1 hypointensity of the distal phalanx of the great toe, compatible with osteomyelitis. No marrow signal abnormality identified elsewhere to suggest  osteomyelitis. Prior amputation of the second and third digits at the MTP joint level. No acute fracture dislocation. No joint effusion. Ligaments Lisfranc ligament is intact. No evidence of collateral ligament injury. Muscles and Tendons Diffusely increased T2 signal of the intrinsic musculature of the foot may reflect chronic denervation changes, however, myositis cannot be excluded. Flexor hallucis longus tendinosis with trace tenosynovitis. Soft tissue Soft tissue ulceration along the plantar aspect of the distal great toe with surrounding soft tissue edema and cutaneous thickening, compatible with cellulitis. No abscess. IMPRESSION: 1. Osteomyelitis of the distal phalanx of the great toe. 2. Soft tissue ulceration along the plantar aspect of the distal great toe with surrounding cellulitis. No abscess. 3. Diffusely increased T2 signal of the intrinsic musculature of the foot may reflect chronic denervation changes, however,  myositis cannot be excluded. 4. Flexor hallucis longus tendinosis with trace tenosynovitis. 5. Prior amputation of the second and third digits at the MTP joint level. Electronically Signed   By: Harrietta Sherry M.D.   On: 04/06/2024 12:22   DG Foot Complete Left Result Date: 04/06/2024 Please see detailed radiograph report in office note.   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  Acute OM of LLE 1st digit -Podiatry consulted; apprec eval/recs -IV linezolid , and ampicillin -sulbactam for now -F/u ESR/CRP -NPO at [facility] for anticipated amputation of LLE 1st digit  DM2 -Diabetes coordinator consulted; apprec eval/recs -Semglee  20U nightly (pta longacting insulin  20U) + SSI TID AC prn  HTN -PTA amlodipine  5mg  daily  HLD -PTA Crestor    Advance Care Planning:   Code Status: Full Code   Consults: Podiatry  Family Communication: N/A  Severity of Illness: The appropriate patient status for this patient is INPATIENT.  Inpatient status is judged to be reasonable and necessary in order to provide the required intensity of service to ensure the patient's safety. The patient's presenting symptoms, physical exam findings, and initial radiographic and laboratory data in the context of their chronic comorbidities is felt to place them at high risk for further clinical deterioration. Furthermore, it is not anticipated that the patient will be medically stable for discharge from the hospital within 2 midnights of admission.   * I certify that at the point of admission it is my clinical judgment that the patient will require inpatient hospital care spanning beyond 2 midnights from the point of admission due to high intensity of service, high risk for further deterioration and high frequency of surveillance required.*   ------- I spent 55 minutes reviewing previous labs/notes, obtaining separate history at the bedside, counseling/discussing the treatment plan outlined above, ordering medications/tests, and performing clinical documentation.  Author: Marsha Ada, MD 04/06/2024 3:37 PM  For on call review [email or web address].

## 2024-04-06 NOTE — Inpatient Diabetes Management (Signed)
 Inpatient Diabetes Program Recommendations  AACE/ADA: New Consensus Statement on Inpatient Glycemic Control   Target Ranges:  Prepandial:   less than 140 mg/dL      Peak postprandial:   less than 180 mg/dL (1-2 hours)      Critically ill patients:  140 - 180 mg/dL    Latest Reference Range & Units 04/06/24 09:29  Glucose 70 - 99 mg/dL 605 (H)   Review of Glycemic Control  Diabetes history: DM2 Outpatient Diabetes medications: Lantus  20-26 units QAM Current orders for Inpatient glycemic control: None  Inpatient Diabetes Program Recommendations:    Insulin : Please consider ordering Semlgee 15 units Q24H, CBGs AC&HS, Novolog  0-9 units TID with meals and Novolog  0-5 units at bedtime.  Thanks, Earnie Gainer, RN, MSN, CDCES Diabetes Coordinator Inpatient Diabetes Program [phone] (Team Pager from 8am to 5pm)

## 2024-04-06 NOTE — Progress Notes (Signed)
 Chief Complaint  Patient presents with   Wound Check    Skin ulcer of left great toe with fat layer exposed. 7 pain and drainage. IDDM A1C 11.8.    HPI: 53 y.o. male presents today with worsening left hallux ulceration and concern for infection.  He states that he has been on it quite a bit.  He comes in his regular shoes.  He states he has noticed drainage over the past couple days.  He denies any nausea, vomiting, fever, chills, chest pain, shortness of breath last A1c 11.8.  He does state that his blood sugars have been running higher than normal as well in the 200s over the past week or so.  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 ALS   Diabetes mellitus    Family history of anesthesia complication    Hyperlipidemia    Hypertension    Kleine-Levin syndrome    OCD (obsessive compulsive disorder)    Restless leg     Past Surgical History:  Procedure Laterality Date   AMPUTATION Right 02/26/2022   Procedure: AMPUTATION 5th TOE;  Surgeon: Harden Jerona GAILS, MD;  Location: [facility] OR;  Service: Orthopedics;  Laterality: Right;   AMPUTATION TOE Left 07/30/2022   Procedure: AMPUTATION TOE, left third toe;  Surgeon: Kallie Manuelita BROCKS, MD;  Location: [facility] ORS;  Service: General;  Laterality: Left;   AMPUTATION TOE Left 11/01/2022   Procedure: AMPUTATION TOE, LEFT SECOND;  Surgeon: Kallie Manuelita BROCKS, MD;  Location: [facility] ORS;  Service: General;  Laterality: Left;   ANTERIOR CERVICAL DECOMP/DISCECTOMY FUSION  11/09/2012   Procedure: ANTERIOR CERVICAL DECOMPRESSION/DISCECTOMY FUSION 3 LEVELS C4-C7 ;  Surgeon: Donaciano Sprang, MD;  Location: [facility] OR;  Service: Orthopedics;;  ACDF C4-C7     Allergies  Allergen Reactions   Ace Inhibitors Rash    Tolerates lisinopril     ROS negative except as stated in HPI   Physical Exam: There were no vitals filed for this visit.  General: The patient is alert and oriented x3  in no acute distress.  Dermatology: Worsening necrotic ulceration present to the left hallux plantar aspect with maceration of surrounding skin with malodor and dusky changes to the distal toe.  The nail plate is loose as well and there is underlying drainage with some ulceration here as well.  Area of ulceration approximately 2 x 1.5 cm.  Postdebridement measurement 2.2 x 1.6 with new ulceration to the dorsal first toe 0.5 x 1 cm dorsal aspect of nailbed.  Plantar aspect predominantly granulation tissue with sinus centrally with necrotic tissue that probes to bone.  Dorsal aspect necrotic changes present.     There is hemorrhagic callus present left fourth toe distal aspect..  Vascular: Palpable pedal pulses bilaterally. Capillary refill within normal limits to the unaffected digits.  Some erythema present to the left first toe.  Neurological: Light touch sensation grossly diminished bilaterally.  Protective sensation absent.  Musculoskeletal Exam: Left foot prior 2nd and 3rd toe amputations.  Right foot prior partial fifth ray resection.  Radiographic Exam: Left foot 3 views weightbearing 04/06/2024 Comparison studies from 11/04/2023.  There is evidence of cortical erosion to the distal hallux with some periosteal reaction.  Soft tissue ulceration seen to the dorsal first toe and plantar aspect.  No other obvious soft tissue emphysema.  Evaluation of soft tissue somewhat limited due to overlying bandages.  Assessment/Plan of Care: 1. Acute osteomyelitis of left ankle or foot (HCC)   2. Skin ulcer of left great toe with necrosis of bone (HCC)      No orders of the defined types were placed in this encounter.  DG FOOT COMPLETE LEFT  Discussed clinical findings with patient today.  Radiographs reviewed with the patient.  Medically necessary excisional debridement using 302 scalpel blade and tissue nipper of nonviable skin and subcutaneous tissue, deep fascial layer/capsule was performed  today to reduce infectious burden to the left first toe.  Hemostasis achieved with compression.  Pre and postdebridement measurements as described above.  Given extent of the infection with concern for tissue loss, instructed patient to present to the emergency department for infectious workup and MRI.  He understands that he will need amputation of the first toe.   Rachelle Edwards L. Lamount MAUL, AACFAS [facility]     [address] [place], [place] [address]                Office [phone]  Fax [phone]

## 2024-04-06 NOTE — Progress Notes (Signed)
 Transition of Care [facility]) - Inpatient Brief Assessment   Patient Details  Name: ARSHDEEP BOLGER MRN: [id number] Date of Birth: [birth year]  Transition of Care [facility]) CM/SW Contact:    Rosaline JONELLE Joe, RN Phone Number: 04/06/2024, 2:50 PM   Clinical Narrative: Patient admitted to the hospital for infectious workup and MRI of first toe.  Patient was seen by podiatry and note states that patient will need amputation.  No TOC at this time.  TOC team will continue to follow the patient for TOC needs.   Transition of Care Asessment: Insurance and Status: (P) Insurance coverage has been reviewed Patient has primary care physician: (P) Yes Home environment has been reviewed: (P) from home Prior level of function:: (P) self Prior/Current Home Services: (P) No current home services Social Drivers of Health Review: (P) SDOH reviewed needs interventions Readmission risk has been reviewed: (P) Yes Transition of care needs: (P) no transition of care needs at this time

## 2024-04-06 NOTE — Progress Notes (Signed)
 ED Pharmacy Antibiotic Sign Off An antibiotic consult was received from an ED provider for vancomycin  and cefepime  per pharmacy dosing for osteomyelitis. A chart review was completed to assess appropriateness.   The following one time order(s) were placed:  Cefepime  2g Vancomycin  2g  Further antibiotic and/or antibiotic pharmacy consults should be ordered by the admitting provider if indicated.   Thank you for allowing pharmacy to be a part of this patient's care.   Leonor GORMAN Bash, [facility]  Clinical Pharmacist 04/06/24 10:13 AM

## 2024-04-06 NOTE — ED Triage Notes (Signed)
 Pt has 2 toes amputated on left foot. Pt states he had blister on left toe and went for check up today. Doctor did x ray and x ray showed osteomyelitis. Denies fevers.

## 2024-04-06 NOTE — ED Provider Notes (Signed)
 [facility] AT [facility] Provider Note   CSN: [id number] Arrival date & time: 04/06/24  9081     Patient presents with: Osteomyelitis    Albert Mcdonald is a 53 y.o. male.   53 year old male with past medical history of diabetes and hypertension presenting to the emergency department today with concern for abscess on his left great toe.  The patient went to his podiatrist this morning.  Had an x-ray performed and there were concerns for osteomyelitis.  Patient was sent to the emergency department that time for further evaluation.  The patient has been feeling well systemically.  Denies any fevers or chills.  He denies any nausea or vomiting.        Prior to Admission medications   Medication Sig Start Date End Date Taking? Authorizing Provider  amLODipine  (NORVASC ) 5 MG tablet Take 1 tablet (5 mg total) by mouth daily. 12/09/23 12/08/24 Yes Del Wilhelmena Lloyd Sola, FNP  ammonium lactate  (AMLACTIN DAILY) 12 % lotion Apply 1 Application topically as needed for dry skin. 12/02/23  Yes Semon, Jake L, DPM  gabapentin  (NEURONTIN ) 300 MG capsule Take 1 capsule (300 mg total) by mouth 2 (two) times daily. 12/09/23  Yes Del Orbe Polanco, Sola, FNP  insulin  glargine (LANTUS  SOLOSTAR) 100 UNIT/ML Solostar Pen Inject 20 Units into the skin at bedtime. Patient taking differently: Inject 20-26 Units into the skin at bedtime. 03/22/24  Yes Del Orbe Polanco, Sola, FNP  rosuvastatin  (CRESTOR ) 40 MG tablet Take 1 tablet (40 mg total) by mouth daily. 12/14/23  Yes Del Orbe Polanco, Sola, FNP  silver  sulfADIAZINE  (SILVADENE ) 1 % cream Apply 1 Application topically daily. Patient taking differently: Apply 1 Application topically daily as needed (skin irritation). 03/09/24  Yes Semon, Jake L, DPM  Vitamin D , Ergocalciferol , (DRISDOL ) 1.25 MG (50000 UNIT) CAPS capsule Take 1 capsule (50,000 Units total) by mouth every 7 (seven) days. 01/09/24  Yes Therisa Benton PARAS, NP   Continuous Glucose Receiver (FREESTYLE LIBRE 2 READER) DEVI Use to check blood sugar daily dx e11.65 12/09/23   Del Wilhelmena Lloyd Sola, FNP  Continuous Glucose Sensor (FREESTYLE LIBRE 2 PLUS SENSOR) MISC 1 each by Does not apply route daily. 12/09/23   Del Wilhelmena Lloyd Sola, FNP  Insulin  Pen Needle 32G X 4 MM MISC Use to inject insulin  once daily 12/23/23   Therisa Benton PARAS, NP    Allergies: Ace inhibitors    Review of Systems  Skin:  Positive for wound.  All other systems reviewed and are negative.   Updated Vital Signs BP (!) 149/92 (BP Location: Right Arm)   Pulse 76   Temp 97.9 F (36.6 C)   Resp 19   Ht 6' 1 (1.854 m)   Wt 99.8 kg   SpO2 99%   BMI 29.03 kg/m   Physical Exam Vitals and nursing note reviewed.   Gen: NAD Eyes: PERRL, EOMI HEENT: no oropharyngeal swelling Neck: trachea midline Resp: clear to auscultation bilaterally Card: RRR, no murmurs, rubs, or gallops Abd: nontender, nondistended Extremities: no calf tenderness, no edema, the patient does have a bandage wrapped around his left great toe with minimal drainage noted Vascular: 2+ radial pulses bilaterally, 2+ DP pulses bilaterally Skin: no rashes Psyc: acting appropriately   (all labs ordered are listed, but only abnormal results are displayed) Labs Reviewed  COMPREHENSIVE METABOLIC PANEL WITH GFR - Abnormal; Notable for the following components:      Result Value   Sodium 132 (*)  Chloride 96 (*)    Glucose, Bld 394 (*)    AST 14 (*)    All other components within normal limits  CBC WITH DIFFERENTIAL/PLATELET - Abnormal; Notable for the following components:   WBC 11.0 (*)    Abs Immature Granulocytes 0.08 (*)    All other components within normal limits  URINALYSIS, W/ REFLEX TO CULTURE (INFECTION SUSPECTED) - Abnormal; Notable for the following components:   Specific Gravity, Urine >1.046 (*)    Glucose, UA >=500 (*)    Ketones, ur 5 (*)    Bacteria, UA RARE (*)    All other  components within normal limits  GLUCOSE, CAPILLARY - Abnormal; Notable for the following components:   Glucose-Capillary 283 (*)    All other components within normal limits  I-STAT CG4 LACTIC ACID, ED - Abnormal; Notable for the following components:   Lactic Acid, Venous 2.1 (*)    All other components within normal limits  CULTURE, BLOOD (ROUTINE X 2)  CULTURE, BLOOD (ROUTINE X 2)  HEMOGLOBIN A1C  I-STAT CG4 LACTIC ACID, ED    EKG: None  Radiology: MR FOOT LEFT WO CONTRAST Result Date: 04/06/2024 CLINICAL DATA:  Diabetic foot ulcer of the great toe. Concern for osteomyelitis. EXAM: MRI OF THE LEFT FOOT WITHOUT CONTRAST TECHNIQUE: Multiplanar, multisequence MR imaging of the left forefoot was performed. No intravenous contrast was administered. COMPARISON:  Same day radiographs of the left foot. MRI of the left foot dated 10/25/2023. FINDINGS: Bones/Joint/Cartilage T2/STIR hyperintense marrow signal abnormality with confluent T1 hypointensity of the distal phalanx of the great toe, compatible with osteomyelitis. No marrow signal abnormality identified elsewhere to suggest osteomyelitis. Prior amputation of the second and third digits at the MTP joint level. No acute fracture dislocation. No joint effusion. Ligaments Lisfranc ligament is intact. No evidence of collateral ligament injury. Muscles and Tendons Diffusely increased T2 signal of the intrinsic musculature of the foot may reflect chronic denervation changes, however, myositis cannot be excluded. Flexor hallucis longus tendinosis with trace tenosynovitis. Soft tissue Soft tissue ulceration along the plantar aspect of the distal great toe with surrounding soft tissue edema and cutaneous thickening, compatible with cellulitis. No abscess. IMPRESSION: 1. Osteomyelitis of the distal phalanx of the great toe. 2. Soft tissue ulceration along the plantar aspect of the distal great toe with surrounding cellulitis. No abscess. 3. Diffusely  increased T2 signal of the intrinsic musculature of the foot may reflect chronic denervation changes, however, myositis cannot be excluded. 4. Flexor hallucis longus tendinosis with trace tenosynovitis. 5. Prior amputation of the second and third digits at the MTP joint level. Electronically Signed   By: Harrietta Sherry M.D.   On: 04/06/2024 12:22   DG Foot Complete Left Result Date: 04/06/2024 Please see detailed radiograph report in office note.    Procedures   Medications Ordered in the ED  LORazepam  (ATIVAN ) tablet 0.5 mg (has no administration in time range)  insulin  aspart (novoLOG ) injection 0-15 Units (has no administration in time range)  insulin  aspart (novoLOG ) injection 0-5 Units (has no administration in time range)  Ampicillin -Sulbactam (UNASYN ) 3 g in sodium chloride  0.9 % 100 mL IVPB (has no administration in time range)  linezolid  (ZYVOX ) IVPB 600 mg (0 mg Intravenous Hold 04/06/24 1439)  ceFEPIme  (MAXIPIME ) 2 g in sodium chloride  0.9 % 100 mL IVPB (0 g Intravenous Stopped 04/06/24 1059)  vancomycin  (VANCOREADY) IVPB 2000 mg/400 mL (0 mg Intravenous Stopped 04/06/24 1347)  insulin  aspart (novoLOG ) injection 10 Units (10 Units Subcutaneous  Given 04/06/24 1442)                                    Medical Decision Making 53 year old male with past medical history of diabetes and hypertension presenting to the emergency department today with concern for osteomyelitis of his left great toe.  I did discuss his case with podiatry.  They recommend IV antibiotics and MRI.  Recommend admission and plan is for surgery either tomorrow or Monday based on the OR availability.  The patient will be admitted after his initial workup is complete.  The patient's initial labs are largely unremarkable with exception of mildly elevated lactic acid and leukocytosis.  Calls placed to hospitalist service for admission.  Case was discussed with podiatry plans for surgery tomorrow or Monday.  Amount  and/or Complexity of Data Reviewed Labs: ordered. Radiology: ordered.  Risk Prescription drug management. Decision regarding hospitalization.        Final diagnoses:  Osteomyelitis of toe [facility])    ED Discharge Orders     None          Ula Prentice SAUNDERS, MD 04/06/24 [phone]

## 2024-04-06 NOTE — Plan of Care (Signed)

## 2024-04-06 NOTE — ED Notes (Addendum)
To floor with transport

## 2024-04-07 DIAGNOSIS — M86472 Chronic osteomyelitis with draining sinus, left ankle and foot: Secondary | ICD-10-CM | POA: Diagnosis not present

## 2024-04-07 DIAGNOSIS — M869 Osteomyelitis, unspecified: Secondary | ICD-10-CM | POA: Diagnosis not present

## 2024-04-07 LAB — BASIC METABOLIC PANEL WITH GFR
Anion gap: 3 — ABNORMAL LOW (ref 5–15)
BUN: 17 mg/dL (ref 6–20)
CO2: 28 mmol/L (ref 22–32)
Calcium: 8.8 mg/dL — ABNORMAL LOW (ref 8.9–10.3)
Chloride: 102 mmol/L (ref 98–111)
Creatinine, Ser: 0.74 mg/dL (ref 0.61–1.24)
GFR, Estimated: >60 mL/min (ref >60)
Glucose, Bld: 240 mg/dL — ABNORMAL HIGH (ref 70–99)
Potassium: 4.1 mmol/L (ref 3.5–5.1)
Sodium: 133 mmol/L — ABNORMAL LOW (ref 135–145)

## 2024-04-07 LAB — CBC
HCT: 44.1 % (ref 39.0–52.0)
Hemoglobin: 15.2 g/dL (ref 13.0–17.0)
MCH: 27.9 pg (ref 26.0–34.0)
MCHC: 34.5 g/dL (ref 30.0–36.0)
MCV: 81.1 fL (ref 80.0–100.0)
Platelets: 191 K/uL (ref 150–400)
RBC: 5.44 MIL/uL (ref 4.22–5.81)
RDW: 11.8 % (ref 11.5–15.5)
WBC: 8.9 K/uL (ref 4.0–10.5)
nRBC: 0 % (ref 0.0–0.2)

## 2024-04-07 LAB — GLUCOSE, CAPILLARY
Glucose-Capillary: 223 mg/dL — ABNORMAL HIGH (ref 70–99)
Glucose-Capillary: 186 mg/dL — ABNORMAL HIGH (ref 70–99)
Glucose-Capillary: 256 mg/dL — ABNORMAL HIGH (ref 70–99)
Glucose-Capillary: 234 mg/dL — ABNORMAL HIGH (ref 70–99)

## 2024-04-07 LAB — BLOOD CULTURE ID PANEL (REFLEXED) - BCID2

## 2024-04-07 MED ORDER — INSULIN GLARGINE-YFGN 100 UNIT/ML ~~LOC~~ SOLN
10.0000 [IU] | Freq: Every day | SUBCUTANEOUS | Status: DC
  Administered 2024-04-07: 10 [IU] via SUBCUTANEOUS
  Filled 2024-04-07 (×3): qty 0.1

## 2024-04-07 MED ORDER — ACETAMINOPHEN 325 MG PO TABS
ORAL_TABLET | ORAL | Status: AC
  Filled 2024-04-07: qty 2

## 2024-04-07 MED ORDER — INSULIN GLARGINE-YFGN 100 UNIT/ML ~~LOC~~ SOLN
20.0000 [IU] | Freq: Every day | SUBCUTANEOUS | Status: DC
  Administered 2024-04-07: 20 [IU] via SUBCUTANEOUS
  Filled 2024-04-07 (×2): qty 0.2

## 2024-04-07 MED ORDER — INSULIN GLARGINE-YFGN 100 UNIT/ML ~~LOC~~ SOLN: 20.0000 [IU] | Freq: Every day | SUBCUTANEOUS | Status: DC

## 2024-04-07 MED ORDER — ACETAMINOPHEN 325 MG PO TABS
650.0000 mg | ORAL_TABLET | Freq: Four times a day (QID) | ORAL | Status: DC | PRN
  Administered 2024-04-07: 650 mg via ORAL
  Filled 2024-04-07: qty 2

## 2024-04-07 NOTE — Progress Notes (Signed)
 PROGRESS NOTE Albert Mcdonald  FMW:[id number] DOB: Nov 11, 1970 DOA: 04/06/2024 PCP: Terry Wilhelmena [name], FNP  Brief Narrative/Hospital Course: 53 y.o. male with medical history significant of DM2, HTN, HLD and Kleine-Levin syndrome presented with left lower extremity 1st digit osteomyelitis. He was seen at Podiatrist's office ON 7/11 and found to have LLE 1st digit OM  and was advised to present to the ED. In the ED, pt hypertensive. Labs notable for lactic acid 2.1-->1.2, WBC 11, and glucose 379. MR LLE showed acute OM of L great tow.  He was admitted to medicine with podiatry consulted.  Subjective: Seen thsi am No pain, no complaints Overnight afebrile BP stable on room air Labs reviewed hyperglycemia in 220s stable CBC renal function, CRP 6.6 He remains n.p.o. for possibility of OR today  Assessment and plan:  Acute OM of LLE 1st digit and preulcerative lesion on the left fourth toe: Appreciate podiatry input, MRI showed osteomyelitis of distal phalanx of great toe, soft tissue ulceration, no abscess, diffuse increased T2 signal of the intrinsic muscle could be chronic denervation changes or myositis, some tendinosis and trace tenosynovitis. CRP elevated although CBC stable afebrile Continue current antibiotics IV Zyvox , Unasyn .  Continue pain management Podiatry planning for left hallux amputation versus left foot TMA   DM2 with uncontrolled hyperglycemia: PTA on long acting insulin  cont at 20U ( at home takes 20-26)- is npo today so will do 10u only, cont same with SSI Recent Labs  Lab 04/06/24 1357 04/06/24 1438 04/06/24 1559 04/06/24 2043 04/07/24 0748  GLUCAP 283*  --  304* 205* 223*  HGBA1C  --  10.6*  --   --   --     HTN BP well-controlled on home amlodipine    HLD Cont home Crestor     Body mass index is 29.03 kg/m.: Will benefit with PCP follow-up, weight loss,healthy lifestyle and outpatient sleep eval if not done.  DVT prophylaxis: SCDs Start:  04/06/24 1103 Code Status:   Code Status: Full Code Family Communication: plan of care discussed with patientat bedside. Patient status is: Remains hospitalized because of severity of illness Level of care: Med-Surg   Dispo: The patient is from: home            Anticipated disposition: TBD Objective: Vitals last 24 hrs: Vitals:   04/06/24 2042 04/07/24 0003 04/07/24 0542 04/07/24 0750  BP: 130/86 112/78 131/88 133/87  Pulse: 77 75 69 70  Resp: 18 18 17 16   Temp: 98.7 F (37.1 C) 98.4 F (36.9 C) 98 F (36.7 C) 98 F (36.7 C)  TempSrc:      SpO2: 97% 98% 99% 97%  Weight:      Height:        Physical Examination: General exam: alert awake, oriented at baseline, older than stated age HEENT:Oral mucosa moist, Ear/Nose WNL grossly Respiratory system: Bilaterally clear BS,no use of accessory muscle Cardiovascular system: S1 & S2 +, No JVD. Gastrointestinal system: Abdomen soft,NT,ND, BS+ Nervous System: Alert, awake, moving all extremities,and following commands. Extremities: lt foot w/ to covered in dressing, previouis toe amputation on 2nd and 3rd toe Skin: No rashes,no icterus. MSK: Normal muscle bulk,tone, power   Data Reviewed: I have personally reviewed following labs and imaging studies ( see epic result tab) CBC: Recent Labs  Lab 04/06/24 0929 04/07/24 0313  WBC 11.0* 8.9  NEUTROABS 7.5  --   HGB 16.2 15.2  HCT 47.1 44.1  MCV 82.3 81.1  PLT 209 191   CMP: Recent Labs  Lab 04/06/24 0929 04/07/24 0313  NA 132* 133*  K 4.4 4.1  CL 96* 102  CO2 24 28  GLUCOSE 394* 240*  BUN 15 17  CREATININE 0.80 0.74  CALCIUM  9.4 8.8*   GFR: Estimated Creatinine Clearance: 134.3 mL/min (by C-G formula based on SCr of 0.74 mg/dL). Recent Labs  Lab 04/06/24 0929  AST 14*  ALT 15  ALKPHOS 103  BILITOT 0.8  PROT 7.7  ALBUMIN  3.8   No results for input(s): LIPASE, AMYLASE in the last 168 hours. No results for input(s): AMMONIA in the last 168  hours. Coagulation Profile: No results for input(s): INR, PROTIME in the last 168 hours. Unresulted Labs (From admission, onward)    None      Antimicrobials/Microbiology: Anti-infectives (From admission, onward)    Start     Dose/Rate Route Frequency Ordered Stop   04/06/24 1800  Ampicillin -Sulbactam (UNASYN ) 3 g in sodium chloride  0.9 % 100 mL IVPB        3 g 200 mL/hr over 30 Minutes Intravenous Every 6 hours 04/06/24 1426     04/06/24 1515  linezolid  (ZYVOX ) IVPB 600 mg        600 mg 300 mL/hr over 60 Minutes Intravenous Every 12 hours 04/06/24 1426     04/06/24 1015  ceFEPIme  (MAXIPIME ) 2 g in sodium chloride  0.9 % 100 mL IVPB        2 g 200 mL/hr over 30 Minutes Intravenous  Once 04/06/24 1013 04/06/24 1059   04/06/24 1015  vancomycin  (VANCOREADY) IVPB 2000 mg/400 mL        2,000 mg 200 mL/hr over 120 Minutes Intravenous  Once 04/06/24 1013 04/06/24 1347         Component Value Date/Time   SDES BLOOD BLOOD RIGHT HAND 04/06/2024 0957   SPECREQUEST  04/06/2024 0957    BOTTLES DRAWN AEROBIC AND ANAEROBIC Blood Culture adequate volume   CULT  04/06/2024 0957    NO GROWTH < 24 HOURS Performed at [facility], [address] [place], [place] [address]    REPTSTATUS PENDING 04/06/2024 0957    Procedures:  Medications reviewed:  Scheduled Meds:  amLODipine   5 mg Oral Daily   gabapentin   300 mg Oral BID   insulin  aspart  0-15 Units Subcutaneous TID WC   insulin  aspart  0-5 Units Subcutaneous QHS   insulin  glargine-yfgn  10 Units Subcutaneous QHS   rosuvastatin   40 mg Oral Daily   Continuous Infusions:  ampicillin -sulbactam (UNASYN ) IV 3 g (04/07/24 0519)   linezolid  (ZYVOX ) IV 600 mg (04/06/24 2231)    Mennie LAMY, MD [facility] 04/07/2024, 9:58 AM

## 2024-04-07 NOTE — Progress Notes (Signed)
 [facility] night cross cover note:   I ordered prn acetaminophen  for the patient's foot discomfort.   Eva Pore, DO Hospitalist

## 2024-04-07 NOTE — Progress Notes (Signed)
 PHARMACY - PHYSICIAN COMMUNICATION CRITICAL VALUE ALERT - BLOOD CULTURE IDENTIFICATION (BCID)  Albert Mcdonald is an 53 y.o. male who presented to [facility] on 04/06/2024 with a chief complaint of osteomyelitis   Name of physician (or Provider) Contacted: Dr. Marcene   Current antibiotics: Zyvox , Unasyn   Changes to prescribed antibiotics recommended:  No changes for now  Results for orders placed or performed during the hospital encounter of 04/06/24  Blood Culture ID Panel (Reflexed) (Collected: 04/06/2024  9:52 AM)  Result Value Ref Range   Enterococcus faecalis NOT DETECTED NOT DETECTED   Enterococcus Faecium NOT DETECTED NOT DETECTED   Listeria monocytogenes NOT DETECTED NOT DETECTED   Staphylococcus species DETECTED (A) NOT DETECTED   Staphylococcus aureus (BCID) NOT DETECTED NOT DETECTED   Staphylococcus epidermidis NOT DETECTED NOT DETECTED   Staphylococcus lugdunensis NOT DETECTED NOT DETECTED   Streptococcus species NOT DETECTED NOT DETECTED   Streptococcus agalactiae NOT DETECTED NOT DETECTED   Streptococcus pneumoniae NOT DETECTED NOT DETECTED   Streptococcus pyogenes NOT DETECTED NOT DETECTED   A.calcoaceticus-baumannii NOT DETECTED NOT DETECTED   Bacteroides fragilis NOT DETECTED NOT DETECTED   Enterobacterales NOT DETECTED NOT DETECTED   Enterobacter cloacae complex NOT DETECTED NOT DETECTED   Escherichia coli NOT DETECTED NOT DETECTED   Klebsiella aerogenes NOT DETECTED NOT DETECTED   Klebsiella oxytoca NOT DETECTED NOT DETECTED   Klebsiella pneumoniae NOT DETECTED NOT DETECTED   Proteus species NOT DETECTED NOT DETECTED   Salmonella species NOT DETECTED NOT DETECTED   Serratia marcescens NOT DETECTED NOT DETECTED   Haemophilus influenzae NOT DETECTED NOT DETECTED   Neisseria meningitidis NOT DETECTED NOT DETECTED   Pseudomonas aeruginosa NOT DETECTED NOT DETECTED   Stenotrophomonas maltophilia NOT DETECTED NOT DETECTED   Candida albicans NOT DETECTED NOT  DETECTED   Candida auris NOT DETECTED NOT DETECTED   Candida glabrata NOT DETECTED NOT DETECTED   Candida krusei NOT DETECTED NOT DETECTED   Candida parapsilosis NOT DETECTED NOT DETECTED   Candida tropicalis NOT DETECTED NOT DETECTED   Cryptococcus neoformans/gattii NOT DETECTED NOT DETECTED    Lynwood Mckusick, PharmD, BCPS Clinical Pharmacist Phone: [phone]

## 2024-04-07 NOTE — Plan of Care (Signed)
   Problem: Education: Goal: Knowledge of General Education information will improve Description: Including pain rating scale, medication(s)/side effects and non-pharmacologic comfort measures Outcome: Progressing   Problem: Health Behavior/Discharge Planning: Goal: Ability to manage health-related needs will improve Outcome: Progressing   Problem: Activity: Goal: Risk for activity intolerance will decrease Outcome: Progressing

## 2024-04-07 NOTE — Plan of Care (Signed)

## 2024-04-07 NOTE — Hospital Course (Addendum)
 53 y.o. male with medical history significant of DM2, HTN, HLD and Kleine-Levin syndrome presented with left lower extremity 1st digit osteomyelitis. He was seen at Podiatrist's office ON 7/11 and found to have LLE 1st digit OM  and was advised to present to the ED. In the ED, pt hypertensive. Labs notable for lactic acid 2.1-->1.2, WBC 11, and glucose 379. MR LLE showed acute OM of L great tow. He was admitted to medicine with podiatry consulted> underwent left TMA.  Postop doing well, Plan is for PT OT and outpatient follow-up with podiatry  Subjective: Seen examined Has been ambulating despite telling him not to do weightbearing on the left leg.  He said he was not putting much weight Overnight afebrile BP stable blood sugar 160s-260s  Pain is controlled  Discharge diagnosis   Acute osteomyelitis LLE 1st digit and preulcerative lesion on the left fourth toe: MRI showed osteomyelitis of distal phalanx of great toe on left, soft tissue ulceration, no abscess, diffuse increased T2 signal of the intrinsic muscle could be chronic denervation changes or myositis, some tendinosis and trace tenosynovitis. S/p  LT TMA 7/14>PSOT OP DOING WELL: NWB in post op shoe to LLE ordered this AM (for protection - doesn't need to wear while in bed) PER PODIATRY: -Sutures: remain intact 2-3 weeks. -Medications/ABX: 5 days po abx on dc -Dressing: Remain on dry and intact until follow up next week Tuesday podiatry will arrange f/u in clinic next week Cleared by PTOT for dc home.  DM2 with uncontrolled hyperglycemia, A1c 10.6: PTA on long acting insulin  cont at 20U ( at home takes 20-26), continue Semglee  20 u, Premeal 5u with  and SSI , cont gabapentin . Recent Labs  Lab 04/06/24 1438 04/06/24 1559 04/09/24 0826 04/09/24 0929 04/09/24 1225 04/09/24 1520 04/09/24 2106  GLUCAP  --    < > 183* 166* 199* 215* 165*  HGBA1C 10.6*  --   --   --   --   --   --    < > = values in this interval not displayed.     HTN BP well-controlled on home amlodipine    HLD Cont home Crestor 

## 2024-04-07 NOTE — Progress Notes (Addendum)
  Subjective:  Patient ID: Albert Mcdonald, male    DOB: 08/12/1971,  MRN: [id number]  Admitted yesterday with left great toe osteomyelitis.  Feeling well had some drainage through bandage overnight  Negative for chest pain and shortness of breath Review of all other systems is negative Objective:   Vitals:   04/07/24 0750 04/07/24 1153  BP: 133/87 125/83  Pulse: 70 72  Resp: 16 16  Temp: 98 F (36.7 C) 97.9 F (36.6 C)  SpO2: 97% 98%   General AA&O x3. Normal mood and affect.  Vascular Foot is warm well-perfused  Neurologic Epicritic sensation grossly reduced.  Dermatologic Necrotic left hallux ulcer, preulcerative callus fourth toe.  Mild serous drainage  Orthopedic: MMT 5/5 in dorsiflexion, plantarflexion, inversion, and eversion. Normal joint ROM without pain or crepitus.   MRI with osteomyelitis.  CBC has normalized with white blood cell count Assessment & Plan:  Patient was evaluated and treated and all questions answered.  Left great toe osteomyelitis - Continue antibiotic therapy.  Has reduced cellulitis and white count normalized.  No evidence of impending sepsis.  Plan for surgery Monday unless he decompensates.  Please notify me if his condition worsens -May have diet today and tomorrow.  N.p.o. past midnight for surgery Monday morning with Dr. Malvin -We again discussed isolated toe amputation versus transmetatarsal Albert Mcdonald the risk and benefits of both.  He is agreeable to transmet amp.  We discussed long-term prospect of this ambulation in a shoe with a shoe filler and functional status.  He is agreeable to the plan as proposed -Dressing change today with Betadine to ulcer and dry sterile dressing  Albert Mcdonald Medicine, DPM  Accessible via secure chat for questions or concerns.

## 2024-04-08 DIAGNOSIS — M86472 Chronic osteomyelitis with draining sinus, left ankle and foot: Secondary | ICD-10-CM | POA: Diagnosis not present

## 2024-04-08 LAB — BASIC METABOLIC PANEL WITH GFR
Anion gap: 9 (ref 5–15)
BUN: 17 mg/dL (ref 6–20)
CO2: 24 mmol/L (ref 22–32)
Calcium: 9 mg/dL (ref 8.9–10.3)
Chloride: 102 mmol/L (ref 98–111)
Creatinine, Ser: 0.7 mg/dL (ref 0.61–1.24)
GFR, Estimated: >60 mL/min (ref >60)
Glucose, Bld: 213 mg/dL — ABNORMAL HIGH (ref 70–99)
Potassium: 4 mmol/L (ref 3.5–5.1)
Sodium: 135 mmol/L (ref 135–145)

## 2024-04-08 LAB — CBC
HCT: 42.3 % (ref 39.0–52.0)
Hemoglobin: 14.9 g/dL (ref 13.0–17.0)
MCH: 28.2 pg (ref 26.0–34.0)
MCHC: 35.2 g/dL (ref 30.0–36.0)
MCV: 80.1 fL (ref 80.0–100.0)
Platelets: 159 K/uL (ref 150–400)
RBC: 5.28 MIL/uL (ref 4.22–5.81)
RDW: 11.9 % (ref 11.5–15.5)
WBC: 6.8 K/uL (ref 4.0–10.5)
nRBC: 0 % (ref 0.0–0.2)

## 2024-04-08 LAB — GLUCOSE, CAPILLARY
Glucose-Capillary: 270 mg/dL — ABNORMAL HIGH (ref 70–99)
Glucose-Capillary: 204 mg/dL — ABNORMAL HIGH (ref 70–99)
Glucose-Capillary: 254 mg/dL — ABNORMAL HIGH (ref 70–99)
Glucose-Capillary: 206 mg/dL — ABNORMAL HIGH (ref 70–99)

## 2024-04-08 MED ORDER — INSULIN GLARGINE-YFGN 100 UNIT/ML ~~LOC~~ SOLN
20.0000 [IU] | Freq: Every day | SUBCUTANEOUS | Status: DC
  Administered 2024-04-09: 20 [IU] via SUBCUTANEOUS
  Filled 2024-04-08 (×2): qty 0.2

## 2024-04-08 MED ORDER — INSULIN GLARGINE-YFGN 100 UNIT/ML ~~LOC~~ SOLN
10.0000 [IU] | Freq: Every day | SUBCUTANEOUS | Status: AC
  Administered 2024-04-08: 10 [IU] via SUBCUTANEOUS
  Filled 2024-04-08 (×2): qty 0.1

## 2024-04-08 MED ORDER — INSULIN ASPART 100 UNIT/ML IJ SOLN
5.0000 [IU] | Freq: Three times a day (TID) | INTRAMUSCULAR | Status: DC
  Administered 2024-04-08 – 2024-04-10 (×5): 5 [IU] via SUBCUTANEOUS

## 2024-04-08 NOTE — Progress Notes (Signed)
 PROGRESS NOTE Albert Mcdonald  FMW:[id number] DOB: [birth year] DOA: 04/06/2024 PCP: Terry Wilhelmena [name], FNP  Brief Narrative/Hospital Course: 53 y.o. male with medical history significant of DM2, HTN, HLD and Kleine-Levin syndrome presented with left lower extremity 1st digit osteomyelitis. He was seen at Podiatrist's office ON 7/11 and found to have LLE 1st digit OM  and was advised to present to the ED. In the ED, pt hypertensive. Labs notable for lactic acid 2.1-->1.2, WBC 11, and glucose 379. MR LLE showed acute OM of L great tow.  He was admitted to medicine with podiatry consulted.  Subjective: Patient seen and examined Some bleeding from his toe waiting for dressing changes Overnight afebrile BP stable CBC normal this morning, BMP blood sugar in 213  Assessment and plan:  Acute OM of LLE 1st digit and preulcerative lesion on the left fourth toe: MRI showed osteomyelitis of distal phalanx of great toe on left, soft tissue ulceration, no abscess, diffuse increased T2 signal of the intrinsic muscle could be chronic denervation changes or myositis, some tendinosis and trace tenosynovitis. CRP elevated although CBC stable afebrile Podiatry following and planning for left hallux amputation versus left foot TMA 7/14. Continue current antibiotics IV Zyvox , Unasyn  and pain management  DM2 with uncontrolled hyperglycemia, A1c 10.6: PTA on long acting insulin  cont at 20U ( at home takes 20-26), continue Semglee  20 units> will change to 10 units tonight for n.p.o. and tomorrow switch back to 20 units, SSI.  Add 5 units Premeal insulin  , continue home gabapentin  Recent Labs  Lab 04/06/24 1438 04/06/24 1559 04/06/24 2043 04/07/24 0748 04/07/24 1151 04/07/24 1620 04/07/24 2024  GLUCAP  --    < > 205* 223* 186* 256* 234*  HGBA1C 10.6*  --   --   --   --   --   --    < > = values in this interval not displayed.    HTN BP well-controlled on home amlodipine    HLD Cont home  Crestor     Body mass index is 29.03 kg/m.: Will benefit with PCP follow-up, weight loss,healthy lifestyle and outpatient sleep eval if not done.  DVT prophylaxis: SCDs Start: 04/06/24 1103 Code Status:   Code Status: Full Code Family Communication: plan of care discussed with patientat bedside. Patient status is: Remains hospitalized because of severity of illness Level of care: Med-Surg   Dispo: The patient is from: home            Anticipated disposition: TBD Objective: Vitals last 24 hrs: Vitals:   04/08/24 0035 04/08/24 0419 04/08/24 0804 04/08/24 1144  BP: 123/89 111/76 118/85 (!) 138/90  Pulse: 71 60 73 69  Resp: 20 20 18 18   Temp: 98.2 F (36.8 C) 97.6 F (36.4 C) 98.2 F (36.8 C) 97.8 F (36.6 C)  TempSrc:      SpO2: 98% 97% 96% 99%  Weight:      Height:        Physical Examination: General exam: alert awake, oriented at baseline, older than stated age HEENT:Oral mucosa moist, Ear/Nose WNL grossly Respiratory system: Bilaterally clear BS,no use of accessory muscle Cardiovascular system: S1 & S2 +, No JVD. Gastrointestinal system: Abdomen soft,NT,ND, BS+ Nervous System: Alert, awake, moving all extremities,and following commands. Extremities: Left lower extremity great toe with bleeding dressing in place  Skin: No rashes,no icterus. MSK: Normal muscle bulk,tone, power   Data Reviewed: I have personally reviewed following labs and imaging studies ( see epic result tab) CBC: Recent Labs  Lab 04/06/24 0929 04/07/24 0313 04/08/24 0558  WBC 11.0* 8.9 6.8  NEUTROABS 7.5  --   --   HGB 16.2 15.2 14.9  HCT 47.1 44.1 42.3  MCV 82.3 81.1 80.1  PLT 209 191 159   CMP: Recent Labs  Lab 04/06/24 0929 04/07/24 0313 04/08/24 0558  NA 132* 133* 135  K 4.4 4.1 4.0  CL 96* 102 102  CO2 24 28 24   GLUCOSE 394* 240* 213*  BUN 15 17 17   CREATININE 0.80 0.74 0.70  CALCIUM  9.4 8.8* 9.0   GFR: Estimated Creatinine Clearance: 134.3 mL/min (by C-G formula based on  SCr of 0.7 mg/dL). Recent Labs  Lab 04/06/24 0929  AST 14*  ALT 15  ALKPHOS 103  BILITOT 0.8  PROT 7.7  ALBUMIN  3.8   No results for input(s): LIPASE, AMYLASE in the last 168 hours. No results for input(s): AMMONIA in the last 168 hours. Coagulation Profile: No results for input(s): INR, PROTIME in the last 168 hours. Unresulted Labs (From admission, onward)     Start     Ordered   04/08/24 0500  Basic metabolic panel with GFR  Daily,   R      04/07/24 0959   04/08/24 0500  CBC  Daily,   R      04/07/24 0959           Antimicrobials/Microbiology: Anti-infectives (From admission, onward)    Start     Dose/Rate Route Frequency Ordered Stop   04/06/24 1800  Ampicillin -Sulbactam (UNASYN ) 3 g in sodium chloride  0.9 % 100 mL IVPB        3 g 200 mL/hr over 30 Minutes Intravenous Every 6 hours 04/06/24 1426     04/06/24 1515  linezolid  (ZYVOX ) IVPB 600 mg        600 mg 300 mL/hr over 60 Minutes Intravenous Every 12 hours 04/06/24 1426     04/06/24 1015  ceFEPIme  (MAXIPIME ) 2 g in sodium chloride  0.9 % 100 mL IVPB        2 g 200 mL/hr over 30 Minutes Intravenous  Once 04/06/24 1013 04/06/24 1059   04/06/24 1015  vancomycin  (VANCOREADY) IVPB 2000 mg/400 mL        2,000 mg 200 mL/hr over 120 Minutes Intravenous  Once 04/06/24 1013 04/06/24 1347         Component Value Date/Time   SDES BLOOD RIGHT HAND 04/06/2024 0957   SPECREQUEST  04/06/2024 0957    BOTTLES DRAWN AEROBIC AND ANAEROBIC Blood Culture adequate volume   CULT  04/06/2024 0957    NO GROWTH 2 DAYS Performed at [facility], [address], [place], [place] [address]    REPTSTATUS PENDING 04/06/2024 0957    Procedures:  Medications reviewed:  Scheduled Meds:  amLODipine   5 mg Oral Daily   gabapentin   300 mg Oral BID   insulin  aspart  0-15 Units Subcutaneous TID WC   insulin  aspart  0-5 Units Subcutaneous QHS   insulin  aspart  5 Units Subcutaneous TID WC   insulin  glargine-yfgn  10 Units  Subcutaneous QHS   [START ON 04/09/2024] insulin  glargine-yfgn  20 Units Subcutaneous QHS   rosuvastatin   40 mg Oral Daily   Continuous Infusions:  ampicillin -sulbactam (UNASYN ) IV 3 g (04/08/24 0601)   linezolid  (ZYVOX ) IV 600 mg (04/08/24 1133)    Mennie LAMY, MD [facility] 04/08/2024, 11:57 AM

## 2024-04-08 NOTE — Plan of Care (Signed)
   Problem: Education: Goal: Knowledge of General Education information will improve Description: Including pain rating scale, medication(s)/side effects and non-pharmacologic comfort measures Outcome: Progressing   Problem: Activity: Goal: Risk for activity intolerance will decrease Outcome: Progressing   Problem: Pain Managment: Goal: General experience of comfort will improve and/or be controlled Outcome: Progressing

## 2024-04-08 NOTE — Plan of Care (Signed)

## 2024-04-09 ENCOUNTER — Other Ambulatory Visit: Payer: Self-pay

## 2024-04-09 ENCOUNTER — Inpatient Hospital Stay (HOSPITAL_COMMUNITY)

## 2024-04-09 ENCOUNTER — Encounter (HOSPITAL_COMMUNITY)
Admission: EM | Disposition: A | Discharge status: Home / Self Care | Payer: Self-pay | Source: Ambulatory Visit | Attending: Hospitalist

## 2024-04-09 ENCOUNTER — Encounter (HOSPITAL_COMMUNITY): Payer: Self-pay | Admitting: Hospitalist

## 2024-04-09 DIAGNOSIS — E1169 Type 2 diabetes mellitus with other specified complication: Secondary | ICD-10-CM

## 2024-04-09 DIAGNOSIS — M869 Osteomyelitis, unspecified: Secondary | ICD-10-CM

## 2024-04-09 DIAGNOSIS — M86172 Other acute osteomyelitis, left ankle and foot: Secondary | ICD-10-CM | POA: Diagnosis not present

## 2024-04-09 DIAGNOSIS — E785 Hyperlipidemia, unspecified: Secondary | ICD-10-CM | POA: Diagnosis not present

## 2024-04-09 DIAGNOSIS — I10 Essential (primary) hypertension: Secondary | ICD-10-CM | POA: Diagnosis not present

## 2024-04-09 HISTORY — PX: TRANSMETATARSAL AMPUTATION: SHX6197

## 2024-04-09 LAB — CULTURE, BLOOD (ROUTINE X 2)
Culture  Setup Time: NO GROWTH
Special Requests: ADEQUATE

## 2024-04-09 LAB — BASIC METABOLIC PANEL WITH GFR
Anion gap: 7 (ref 5–15)
BUN: 13 mg/dL (ref 6–20)
CO2: 27 mmol/L (ref 22–32)
Calcium: 9 mg/dL (ref 8.9–10.3)
Chloride: 102 mmol/L (ref 98–111)
Creatinine, Ser: 0.72 mg/dL (ref 0.61–1.24)
GFR, Estimated: >60 mL/min (ref >60)
Glucose, Bld: 201 mg/dL — ABNORMAL HIGH (ref 70–99)
Potassium: 4.1 mmol/L (ref 3.5–5.1)
Sodium: 136 mmol/L (ref 135–145)

## 2024-04-09 LAB — CBC
HCT: 42.9 % (ref 39.0–52.0)
Hemoglobin: 14.9 g/dL (ref 13.0–17.0)
MCH: 28 pg (ref 26.0–34.0)
MCHC: 34.7 g/dL (ref 30.0–36.0)
MCV: 80.6 fL (ref 80.0–100.0)
Platelets: 185 K/uL (ref 150–400)
RBC: 5.32 MIL/uL (ref 4.22–5.81)
RDW: 11.9 % (ref 11.5–15.5)
WBC: 7.4 K/uL (ref 4.0–10.5)
nRBC: 0 % (ref 0.0–0.2)

## 2024-04-09 LAB — GLUCOSE, CAPILLARY
Glucose-Capillary: 173 mg/dL — ABNORMAL HIGH (ref 70–99)
Glucose-Capillary: 183 mg/dL — ABNORMAL HIGH (ref 70–99)
Glucose-Capillary: 166 mg/dL — ABNORMAL HIGH (ref 70–99)
Glucose-Capillary: 199 mg/dL — ABNORMAL HIGH (ref 70–99)
Glucose-Capillary: 215 mg/dL — ABNORMAL HIGH (ref 70–99)
Glucose-Capillary: 165 mg/dL — ABNORMAL HIGH (ref 70–99)

## 2024-04-09 SURGERY — AMPUTATION, FOOT, TRANSMETATARSAL
Anesthesia: Monitor Anesthesia Care | Site: Foot | Laterality: Left

## 2024-04-09 MED ORDER — LACTATED RINGERS IV SOLN: INTRAVENOUS | Status: DC

## 2024-04-09 MED ORDER — LIDOCAINE HCL (PF) 1 % IJ SOLN
INTRAMUSCULAR | Status: AC
  Filled 2024-04-09: qty 30

## 2024-04-09 MED ORDER — MIDAZOLAM HCL 2 MG/2ML IJ SOLN
INTRAMUSCULAR | Status: AC
  Filled 2024-04-09: qty 2

## 2024-04-09 MED ORDER — PROPOFOL 10 MG/ML IV BOLUS
INTRAVENOUS | Status: AC
  Filled 2024-04-09: qty 20

## 2024-04-09 MED ORDER — LIDOCAINE HCL (PF) 1 % IJ SOLN
INTRAMUSCULAR | Status: DC | PRN
  Administered 2024-04-09: 30 mL

## 2024-04-09 MED ORDER — FENTANYL CITRATE (PF) 100 MCG/2ML IJ SOLN: 25.0000 ug | INTRAMUSCULAR | Status: DC | PRN

## 2024-04-09 MED ORDER — ORAL CARE MOUTH RINSE: 15.0000 mL | Freq: Once | OROMUCOSAL | Status: AC

## 2024-04-09 MED ORDER — BUPIVACAINE HCL (PF) 0.5 % IJ SOLN
INTRAMUSCULAR | Status: DC | PRN
  Administered 2024-04-09: 25 mL via PERINEURAL

## 2024-04-09 MED ORDER — ONDANSETRON HCL 4 MG/2ML IJ SOLN: 4.0000 mg | Freq: Four times a day (QID) | INTRAMUSCULAR | Status: DC | PRN

## 2024-04-09 MED ORDER — BUPIVACAINE HCL (PF) 0.5 % IJ SOLN
INTRAMUSCULAR | Status: AC
  Filled 2024-04-09: qty 30

## 2024-04-09 MED ORDER — SODIUM CHLORIDE 0.9 % IR SOLN
Status: DC | PRN
  Administered 2024-04-09: 1000 mL

## 2024-04-09 MED ORDER — FENTANYL CITRATE (PF) 250 MCG/5ML IJ SOLN
INTRAMUSCULAR | Status: DC | PRN
  Administered 2024-04-09: 100 ug via INTRAVENOUS

## 2024-04-09 MED ORDER — OXYCODONE HCL 5 MG/5ML PO SOLN: 5.0000 mg | Freq: Once | ORAL | Status: DC | PRN

## 2024-04-09 MED ORDER — BUPIVACAINE HCL (PF) 0.5 % IJ SOLN
INTRAMUSCULAR | Status: DC | PRN
  Administered 2024-04-09: 30 mL

## 2024-04-09 MED ORDER — LIDOCAINE 2% (20 MG/ML) 5 ML SYRINGE
INTRAMUSCULAR | Status: AC
  Filled 2024-04-09: qty 5

## 2024-04-09 MED ORDER — CHLORHEXIDINE GLUCONATE 0.12 % MT SOLN
15.0000 mL | Freq: Once | OROMUCOSAL | Status: AC
  Administered 2024-04-09: 15 mL via OROMUCOSAL
  Filled 2024-04-09: qty 15

## 2024-04-09 MED ORDER — PROPOFOL 500 MG/50ML IV EMUL
INTRAVENOUS | Status: DC | PRN
  Administered 2024-04-09: 100 ug/kg/min via INTRAVENOUS

## 2024-04-09 MED ORDER — MIDAZOLAM HCL 2 MG/2ML IJ SOLN
INTRAMUSCULAR | Status: DC | PRN
  Administered 2024-04-09: 2 mg via INTRAVENOUS

## 2024-04-09 MED ORDER — CEPHALEXIN 500 MG PO CAPS
500.0000 mg | ORAL_CAPSULE | Freq: Three times a day (TID) | ORAL | Status: DC
  Administered 2024-04-09 – 2024-04-10 (×4): 500 mg via ORAL
  Filled 2024-04-09 (×4): qty 1

## 2024-04-09 MED ORDER — OXYCODONE HCL 5 MG PO TABS: 5.0000 mg | ORAL_TABLET | Freq: Once | ORAL | Status: DC | PRN

## 2024-04-09 MED ORDER — ONDANSETRON HCL 4 MG/2ML IJ SOLN
INTRAMUSCULAR | Status: DC | PRN
  Administered 2024-04-09: 4 mg via INTRAVENOUS

## 2024-04-09 MED ORDER — INSULIN ASPART 100 UNIT/ML IJ SOLN
0.0000 [IU] | INTRAMUSCULAR | Status: DC | PRN
  Administered 2024-04-09: 2 [IU] via SUBCUTANEOUS

## 2024-04-09 MED ORDER — FENTANYL CITRATE (PF) 250 MCG/5ML IJ SOLN
INTRAMUSCULAR | Status: AC
  Filled 2024-04-09: qty 5

## 2024-04-09 MED ORDER — ONDANSETRON HCL 4 MG/2ML IJ SOLN
INTRAMUSCULAR | Status: AC
  Filled 2024-04-09: qty 2

## 2024-04-09 MED ORDER — ENOXAPARIN SODIUM 40 MG/0.4ML IJ SOSY
40.0000 mg | PREFILLED_SYRINGE | INTRAMUSCULAR | Status: DC
  Administered 2024-04-09: 40 mg via SUBCUTANEOUS
  Filled 2024-04-09: qty 0.4

## 2024-04-09 SURGICAL SUPPLY — 41 items
ALLOGRAFT AMNI BIOVANCE 5X5 1L (Graft) IMPLANT
BLADE AVERAGE 25X9 (BLADE) IMPLANT
BLADE SURG 10 STRL SS (BLADE) ×1 IMPLANT
BLADE SURG 15 STRL LF DISP TIS (BLADE) ×1 IMPLANT
BNDG COHESIVE 3X5 TAN ST LF (GAUZE/BANDAGES/DRESSINGS) ×1 IMPLANT
BNDG COMPR ESMARK 4X3 LF (GAUZE/BANDAGES/DRESSINGS) ×1 IMPLANT
BNDG ELASTIC 3INX 5YD STR LF (GAUZE/BANDAGES/DRESSINGS) ×1 IMPLANT
BNDG ELASTIC 4INX 5YD STR LF (GAUZE/BANDAGES/DRESSINGS) IMPLANT
BNDG ELASTIC 4X5.8 VLCR NS LF (GAUZE/BANDAGES/DRESSINGS) IMPLANT
BNDG GAUZE DERMACEA FLUFF 4 (GAUZE/BANDAGES/DRESSINGS) IMPLANT
CHLORAPREP W/TINT 26 (MISCELLANEOUS) IMPLANT
DRSG ADAPTIC 3X8 NADH LF (GAUZE/BANDAGES/DRESSINGS) IMPLANT
DRSG XEROFORM 1X8 (GAUZE/BANDAGES/DRESSINGS) IMPLANT
ELECTRODE REM PT RTRN 9FT ADLT (ELECTROSURGICAL) ×1 IMPLANT
GAUZE PAD ABD 8X10 STRL (GAUZE/BANDAGES/DRESSINGS) IMPLANT
GAUZE SPONGE 2X2 STRL 8-PLY (GAUZE/BANDAGES/DRESSINGS) IMPLANT
GAUZE SPONGE 4X4 12PLY STRL (GAUZE/BANDAGES/DRESSINGS) ×1 IMPLANT
GAUZE SPONGE 4X4 12PLY STRL LF (GAUZE/BANDAGES/DRESSINGS) IMPLANT
GAUZE STRETCH 2X75IN STRL (MISCELLANEOUS) ×1 IMPLANT
GAUZE XEROFORM 1X8 LF (GAUZE/BANDAGES/DRESSINGS) ×1 IMPLANT
GLOVE BIO SURGEON STRL SZ7.5 (GLOVE) ×1 IMPLANT
GLOVE BIOGEL PI IND STRL 7.5 (GLOVE) ×1 IMPLANT
GOWN STRL REUS W/ TWL LRG LVL3 (GOWN DISPOSABLE) ×2 IMPLANT
KIT BASIN OR (CUSTOM PROCEDURE TRAY) ×1 IMPLANT
NDL HYPO 25X1 1.5 SAFETY (NEEDLE) ×1 IMPLANT
NEEDLE HYPO 25X1 1.5 SAFETY (NEEDLE) ×1 IMPLANT
PACK ORTHO EXTREMITY (CUSTOM PROCEDURE TRAY) ×1 IMPLANT
PADDING CAST ABS COTTON 4X4 ST (CAST SUPPLIES) ×2 IMPLANT
SET HNDPC FAN SPRY TIP SCT (DISPOSABLE) IMPLANT
SPIKE FLUID TRANSFER (MISCELLANEOUS) IMPLANT
STAPLER SKIN PROX 35W (STAPLE) IMPLANT
STOCKINETTE 4X48 STRL (DRAPES) IMPLANT
SUT ETHILON 3 0 FSLX (SUTURE) IMPLANT
SUT PROLENE 2 0 CT2 30 (SUTURE) IMPLANT
SUT PROLENE 3 0 PS 2 (SUTURE) IMPLANT
SUT PROLENE 4 0 PS 2 18 (SUTURE) IMPLANT
SYR CONTROL 10ML LL (SYRINGE) ×1 IMPLANT
TUBE CONNECTING 12X1/4 (SUCTIONS) IMPLANT
UNDERPAD 30X36 HEAVY ABSORB (UNDERPADS AND DIAPERS) ×1 IMPLANT
WATER STERILE IRR 1000ML POUR (IV SOLUTION) ×1 IMPLANT
YANKAUER SUCT BULB TIP NO VENT (SUCTIONS) IMPLANT

## 2024-04-09 NOTE — Progress Notes (Signed)
 Pt taken via stretcher to OR Holding. Report given to OR personnel; stated would give Report to CRNA. Attempt to call CRNA & CRNA charge unsuccessful at this time. Pt belongings remain at bedside on Unit.

## 2024-04-09 NOTE — Plan of Care (Signed)

## 2024-04-09 NOTE — Anesthesia Preprocedure Evaluation (Signed)
 Anesthesia Evaluation  Patient identified by MRN, date of birth, ID band Patient awake    Reviewed: Allergy & Precautions, H&P , NPO status , Patient's Chart, lab work & pertinent test results  Airway Mallampati: II   Neck ROM: full    Dental   Pulmonary neg pulmonary ROS   breath sounds clear to auscultation       Cardiovascular hypertension,  Rhythm:regular Rate:Normal     Neuro/Psych  PSYCHIATRIC DISORDERS  Depression    ALS    GI/Hepatic   Endo/Other  diabetes, Type 2    Renal/GU      Musculoskeletal  (+) Arthritis ,    Abdominal   Peds  Hematology   Anesthesia Other Findings   Reproductive/Obstetrics                              Anesthesia Physical Anesthesia Plan  ASA: 3  Anesthesia Plan: General   Post-op Pain Management: Regional block*   Induction: Intravenous  PONV Risk Score and Plan: 2 and Ondansetron , Dexamethasone , Midazolam  and Treatment may vary due to age or medical condition  Airway Management Planned: Oral ETT  Additional Equipment:   Intra-op Plan:   Post-operative Plan: Extubation in OR  Informed Consent: I have reviewed the patients History and Physical, chart, labs and discussed the procedure including the risks, benefits and alternatives for the proposed anesthesia with the patient or authorized representative who has indicated his/her understanding and acceptance.     Dental advisory given  Plan Discussed with: CRNA, Anesthesiologist and Surgeon  Anesthesia Plan Comments:         Anesthesia Quick Evaluation

## 2024-04-09 NOTE — Progress Notes (Signed)
 History and Physical Interval Note:  04/09/2024 7:27 AM  Albert Mcdonald Service  has presented today for surgery, with the diagnosis of osteomyelitis of the left great toe, prior 2nd and 3rd toe amputation.  The various methods of treatment have been discussed with the patient and family. After consideration of risks, benefits and other options for treatment, the patient has consented to   Procedure(s) with comments: AMPUTATION, FOOT, TRANSMETATARSAL (Left) - POSSIBLE ACHILLES LENGTHENING as a surgical intervention.  The patient's history has been reviewed, patient examined, no change in status, stable for surgery.  I have reviewed the patient's chart and labs.  Questions were answered to the patient's satisfaction.     Albert Mcdonald

## 2024-04-09 NOTE — Anesthesia Procedure Notes (Signed)
 Anesthesia Regional Block: Popliteal block   Pre-Anesthetic Checklist: , timeout performed,  Correct Patient, Correct Site, Correct Laterality,  Correct Procedure, Correct Position, site marked,  Risks and benefits discussed,  Surgical consent,  Pre-op evaluation,  At surgeon's request and post-op pain management  Laterality: Left  Prep: chloraprep       Needles:  Injection technique: Single-shot  Needle Type: Echogenic Stimulator Needle          Additional Needles:   Procedures:, nerve stimulator,,,,,     Nerve Stimulator or Paresthesia:  Response: plantar flexion of foot, 0.45 mA  Additional Responses:   Narrative:  Start time: 04/09/2024 7:28 AM End time: 04/09/2024 7:33 AM Injection made incrementally with aspirations every 5 mL.  Performed by: Personally  Anesthesiologist: Maryclare Cornet, MD  Additional Notes: Functioning IV was confirmed and monitors were applied.  A 90mm 21ga Arrow echogenic stimulator needle was used. Sterile prep and drape,hand hygiene and sterile gloves were used.  Negative aspiration and negative test dose prior to incremental administration of local anesthetic. The patient tolerated the procedure well.  Ultrasound guidance: relevent anatomy identified, needle position confirmed, local anesthetic spread visualized around nerve(s), vascular puncture avoided.  Image printed for medical record.

## 2024-04-09 NOTE — Plan of Care (Signed)
   Problem: Education: Goal: Knowledge of General Education information will improve Description: Including pain rating scale, medication(s)/side effects and non-pharmacologic comfort measures Outcome: Progressing   Problem: Activity: Goal: Risk for activity intolerance will decrease Outcome: Progressing   Problem: Pain Managment: Goal: General experience of comfort will improve and/or be controlled Outcome: Progressing

## 2024-04-09 NOTE — Transfer of Care (Signed)
 Immediate Anesthesia Transfer of Care Note  Patient: Albert Mcdonald  Procedure(s) Performed: AMPUTATION, FOOT, LEFT TRANSMETATARSAL (Left: Foot)  Patient Location: PACU  Anesthesia Type:MAC combined with regional for post-op pain  Level of Consciousness: awake, alert , oriented, and patient cooperative  Airway & Oxygen Therapy: Patient Spontanous Breathing and Patient connected to face mask oxygen  Post-op Assessment: Report given to RN and Post -op Vital signs reviewed and stable  Post vital signs: Reviewed and stable  Last Vitals:  Vitals Value Taken Time  BP 109/75 04/09/24 08:26  Temp    Pulse 68 04/09/24 08:30  Resp 16 04/09/24 08:30  SpO2 95 % 04/09/24 08:30  Vitals shown include unfiled device data.  Last Pain:  Vitals:   04/09/24 0650  TempSrc:   PainSc: 0-No pain      Patients Stated Pain Goal: 0 (04/07/24 2017)  Complications: No notable events documented.

## 2024-04-09 NOTE — Op Note (Signed)
 Full Operative Report  Date of Operation: 7:39 AM, 04/09/2024   Patient: Albert Mcdonald - 53 y.o. male  Surgeon: Malvin Marsa FALCON, DPM   Assistant: None  Diagnosis: OSTEOMYELITIS of LEFT hallux  Procedure:  1. Transmetatarsal amputation of the left foot 2. Application of amniotic graft 5x5 cm, left foot    Anesthesia: Anesthesia type not filed in the log.  No responsible provider has been recorded for the case.  CRNA: Arvell Edsel HERO, CRNA Student Nurse Anesthetist: Mathew Bernardino RAMAN, RN   Estimated Blood Loss: Minimal   Hemostasis: 1) Anatomical dissection, mechanical compression, electrocautery 2) Ankle tourniquet inflated to 250 mm Hg  Implants: * No implants in log *  Materials: prolene 3-0   Injectables: 1) Pre-operatively: Pre op popliteal nerve block per anesthesia  2) Post-operatively: None   Specimens: - Pathology: Left forefoot - Microbiology: bone culture left hallux   Antibiotics: IV antibiotics given per schedule on the floor  Drains: None  Complications: Patient tolerated the procedure well without complication.   Operative findings: As below in detailed report  Indications for Procedure: Albert Mcdonald presents to Malvin Marsa FALCON, DPM with a chief complaint of osteomyelitis of the left hallux. Of note, pre procedure ankle dorsiflexion was tested and found to be with 10 degrees passive dorsiflexion so no tendon lengthening procedure was required. The patient has failed conservative treatments of various modalities. At this time the patient has elected to proceed with surgical correction. All alternatives, risks, and complications of the procedures were thoroughly explained to the patient. Patient exhibits appropriate understanding of all discussion points and informed consent was signed and obtained in the chart with no guarantees to surgical outcome given or implied.  Description of Procedure: Patient was brought to the operating  room. Patient remained on their hospital bed in the supine position. A surgical timeout was performed and all members of the operating room, the procedure, and the surgical site were identified. anesthesia occurred as per anesthesia record. Local anesthetic as previously described was then injected about the operative field in a local infiltrative block.  The operative lower extremity as noted above was then prepped and draped in the usual sterile manner. The following procedure then began.  Attention was directed to the LEFT lower extremity. A fish-mouth type incision was made proximal to the web spaces and encompassed the entire forefoot. The full-thickness incision was made with a longer plantar flap to allow for wound closure. The incision was continued through the soft tissue down to the shafts of the metatarsal bones. A 15 blade was then used to free up the periosteum on all the metatarsal shafts. Using an oscillating saw, the metatarsals were cut in a dorsal distal to plantar proximal orientation. The first metatarsal was beveled so that the medial cortex was shorter than the lateral, and the fifth metatarsal was beveled so that the lateral cortex was shorter than the medial, thus less prominent. The amputation was done so that a metatarsal parabola was maintained.  The distal portion including all the digits were freed from the metatarsals and soft tissue attachments. The specimen was passed off the field and sent for gross pathology. All remaining non-viable and necrotic tissues were sharply resected and removed. Extensor and flexors tendons were grasped with a hemostat and cut proximally.  The surgical site was then flushed with copious amount of saline.  Next in order to promote healing at the amputation site, decision was made to implant amniotic graft. A 5x5 cm  single layer biovance graft was implanted into the amputation site. This will promote soft tissue healing, decrease adhesions and scarring.  The plantar flap was brought in approximation with the dorsal flap and the sutures material previously described was used for closure. Care was taken not to place the flaps under tension in order not to jeopardize the vascular supply.  The surgical site was then dressed with xerform 4x4 gauze abd pad kerlix and ace. The patient tolerated both the procedure and anesthesia well with vital signs stable throughout. The patient was transferred in good condition and all vital signs stable  from the OR to recovery under the discretion of anesthesia.  Condition: Vital signs stable, neurovascular status unchanged from preoperative   Surgical plan:  Expect clean margin. NWB to LLE in post op shoe. 5 days PO abx recommended post op. Dressing to remain C/D/I.  The patient will be NWb in a post op shoe to the operative limb until further instructed. The dressing is to remain clean, dry, and intact. Will continue to follow unless noted elsewhere.   Marsa Honour, DPM [facility]

## 2024-04-09 NOTE — Care Management Important Message (Signed)
 Important Message  Patient Details  Name: Albert Mcdonald MRN: [id number] Date of Birth: 1970/12/17   Important Message Given:  Yes - Medicare IM     Claretta Deed 04/09/2024, 3:35 PM

## 2024-04-09 NOTE — Inpatient Diabetes Management (Addendum)
 Inpatient Diabetes Program Recommendations  AACE/ADA: New Consensus Statement on Inpatient Glycemic Control (2015)  Target Ranges:  Prepandial:   less than 140 mg/dL      Peak postprandial:   less than 180 mg/dL (1-2 hours)      Critically ill patients:  140 - 180 mg/dL    Latest Reference Range & Units 10/24/23 09:31 12/09/23 15:02 04/06/24 14:38  Hemoglobin A1C 4.8 - 5.6 % 11.9 (H) 11.8 (H) 10.6 (H)  258 mg/dl  (H): Data is abnormally high  Latest Reference Range & Units 04/08/24 08:47 04/08/24 11:43 04/08/24 17:10 04/08/24 20:24  Glucose-Capillary 70 - 99 mg/dL 729 (H)  13 units Novolog  @1051  204 (H) 254 (H)  8 units Novolog   206 (H)  2 units Novolog   10 units Semglee   (H): Data is abnormally high  Latest Reference Range & Units 04/09/24 08:26 04/09/24 09:29  Glucose-Capillary 70 - 99 mg/dL 816 (H) 833 (H)  (H): Data is abnormally high     Admit with: Acute osteomyelitis LLE 1st digit and preulcerative lesion on the left fourth toe   History: DM  Home DM Meds: Lantus  20-26 units at HS       Freestyle Libre 2 CGM  Current Orders: Semglee  20 units at HS     Novolog  5 units TID with meals     Novolog  Moderate Correction Scale/ SSI (0-15 units) TID AC + HS     Underwent Transmetatarsal amputation of the left foot + Application of amniotic graft 5x5 cm, left foot this AM  Note pt got lower dose Semglee  last PM (10 units)--Will get 20 units tonight  Needs follow up with ENDO given current A1c  ENDO: Benton Rio, NP with [facility] Last Seen 12/23/2023 Per ENDO notes:  Due to cognitive deficits, will try and keep his regimen as simple as possible to increase compliance and safety.  He is advised to increase Lantus  to 20 units SQ nightly    Addendum 12:40pm--Met w/ pt at bedside this afternoon.  Pt told me he takes Lantus  20 units at HS at home.  Verified ENDO is Benton Rio, NP with [facility].  States he needs to  make follow up appt with ENDO after he goes home.  Has FSL2 CGM and has the reader at home.  We reviewed his current A1c of 10.6% and how this is relatively unchanged since January.  We talked about how healthy A1c would be around 7% and healthy CBGs 90-130 (even 150 would be better).  I asked pt is he sees glucose >200 on his CGM and he stated yes.  Asked me why his CBGs stay elevated even though he is eating better and taking his Lantus --We talked about how his pancreas may not be making sufficient insulin  to cover his food intake and elevated CBGs outside of meal times.  Discussed with pt that we are checking his CBGs TID AC and giving him a rapid acting insulin  called Novolog  to cover his elevated CBGs and to cover his food.  Explained to pt that he may need more aggressive insulin  regimen at home and asked pt to please address this with his ENDO at next appt.  Pt stated he would be willing to take more insulin  if needed.  Pt very appreciative of info and did not have any further questions for me at this time.     --Will follow patient during hospitalization--  Adina Rudolpho Arrow RN, MSN, CDCES Diabetes Coordinator Inpatient Glycemic  Control Team Team Pager: [phone] (8a-5p)

## 2024-04-09 NOTE — Progress Notes (Signed)
 PROGRESS NOTE Albert Mcdonald  FMW:[id number] DOB: [birth year] DOA: 04/06/2024 PCP: Terry Wilhelmena [name], FNP  Brief Narrative/Hospital Course: 53 y.o. male with medical history significant of DM2, HTN, HLD and Kleine-Levin syndrome presented with left lower extremity 1st digit osteomyelitis. He was seen at Podiatrist's office ON 7/11 and found to have LLE 1st digit OM  and was advised to present to the ED. In the ED, pt hypertensive. Labs notable for lactic acid 2.1-->1.2, WBC 11, and glucose 379. MR LLE showed acute OM of L great tow.  He was admitted to medicine with podiatry consulted.  Subjective: Patient seen examined  He is alert awake oriented pain is controlled Underwent TMA left foot this morning  Assessment and plan:  Acute osteomyelitis LLE 1st digit and preulcerative lesion on the left fourth toe: MRI showed osteomyelitis of distal phalanx of great toe on left, soft tissue ulceration, no abscess, diffuse increased T2 signal of the intrinsic muscle could be chronic denervation changes or myositis, some tendinosis and trace tenosynovitis. Status post TMA left foot this morning Discussed with pharmacy -Per podiatry- plan for PO KEFLEX  x 5 days post op. Cont wound care.  DM2 with uncontrolled hyperglycemia, A1c 10.6: PTA on long acting insulin  cont at 20U ( at home takes 20-26), received half dose last night, resume Semglee  20 u, Premeal and SSI , cont gabapentin . Recent Labs  Lab 04/06/24 1438 04/06/24 1559 04/08/24 1710 04/08/24 2024 04/09/24 0653 04/09/24 0826 04/09/24 0929  GLUCAP  --    < > 254* 206* 173* 183* 166*  HGBA1C 10.6*  --   --   --   --   --   --    < > = values in this interval not displayed.    HTN BP well-controlled on home amlodipine    HLD Cont home Crestor    Overweight Body mass index is 29 kg/m.: Will benefit with PCP follow-up, weight loss,healthy lifestyle and outpatient sleep eval if not done.  DVT prophylaxis: enoxaparin  (LOVENOX )  injection 40 mg Start: 04/09/24 2200 SCDs Start: 04/06/24 1103 Code Status:   Code Status: Full Code Family Communication: plan of care discussed with patientat bedside. Patient status is: Remains hospitalized because of severity of illness Level of care: Med-Surg   Dispo: The patient is from: home            Anticipated disposition: TBD Objective: Vitals last 24 hrs: Vitals:   04/09/24 0845 04/09/24 0900 04/09/24 0926 04/09/24 0933  BP: 118/86  121/61 121/61  Pulse: 66  65   Resp: 10  20   Temp:  (!) 97.3 F (36.3 C) 98 F (36.7 C)   TempSrc:      SpO2: 96%  98%   Weight:      Height:        Physical Examination: General exam: alert awake, oriented HEENT:Oral mucosa moist, Ear/Nose WNL grossly Respiratory system: Bilaterally clear BS,no use of accessory muscle Cardiovascular system: S1 & S2 +, No JVD. Gastrointestinal system: Abdomen soft,NT,ND, BS+ Nervous System: Alert, awake, moving all extremities,and following commands. Extremities: LLE  Foot on dressing postop  Skin: No rashes,no icterus. MSK: Normal muscle bulk,tone, power   Data Reviewed: I have personally reviewed following labs and imaging studies ( see epic result tab) CBC: Recent Labs  Lab 04/06/24 0929 04/07/24 0313 04/08/24 0558 04/09/24 0422  WBC 11.0* 8.9 6.8 7.4  NEUTROABS 7.5  --   --   --   HGB 16.2 15.2 14.9 14.9  HCT 47.1  44.1 42.3 42.9  MCV 82.3 81.1 80.1 80.6  PLT 209 191 159 185   CMP: Recent Labs  Lab 04/06/24 0929 04/07/24 0313 04/08/24 0558 04/09/24 0422  NA 132* 133* 135 136  K 4.4 4.1 4.0 4.1  CL 96* 102 102 102  CO2 24 28 24 27   GLUCOSE 394* 240* 213* 201*  BUN 15 17 17 13   CREATININE 0.80 0.74 0.70 0.72  CALCIUM  9.4 8.8* 9.0 9.0   GFR: Estimated Creatinine Clearance: 134.1 mL/min (by C-G formula based on SCr of 0.72 mg/dL). Recent Labs  Lab 04/06/24 0929  AST 14*  ALT 15  ALKPHOS 103  BILITOT 0.8  PROT 7.7  ALBUMIN  3.8   No results for input(s): LIPASE,  AMYLASE in the last 168 hours. No results for input(s): AMMONIA in the last 168 hours. Coagulation Profile: No results for input(s): INR, PROTIME in the last 168 hours. Unresulted Labs (From admission, onward)     Start     Ordered   04/08/24 0500  Basic metabolic panel with GFR  Daily,   R      04/07/24 0959   04/08/24 0500  CBC  Daily,   R      04/07/24 0959           Antimicrobials/Microbiology: Anti-infectives (From admission, onward)    Start     Dose/Rate Route Frequency Ordered Stop   04/09/24 1400  cephALEXin  (KEFLEX ) capsule 500 mg        500 mg Oral Every 8 hours 04/09/24 1002 04/11/24 0559   04/06/24 1800  Ampicillin -Sulbactam (UNASYN ) 3 g in sodium chloride  0.9 % 100 mL IVPB  Status:  Discontinued        3 g 200 mL/hr over 30 Minutes Intravenous Every 6 hours 04/06/24 1426 04/09/24 1002   04/06/24 1515  linezolid  (ZYVOX ) IVPB 600 mg  Status:  Discontinued        600 mg 300 mL/hr over 60 Minutes Intravenous Every 12 hours 04/06/24 1426 04/09/24 1002   04/06/24 1015  ceFEPIme  (MAXIPIME ) 2 g in sodium chloride  0.9 % 100 mL IVPB        2 g 200 mL/hr over 30 Minutes Intravenous  Once 04/06/24 1013 04/06/24 1059   04/06/24 1015  vancomycin  (VANCOREADY) IVPB 2000 mg/400 mL        2,000 mg 200 mL/hr over 120 Minutes Intravenous  Once 04/06/24 1013 04/06/24 1347         Component Value Date/Time   SDES BLOOD RIGHT HAND 04/06/2024 0957   SPECREQUEST  04/06/2024 0957    BOTTLES DRAWN AEROBIC AND ANAEROBIC Blood Culture adequate volume   CULT  04/06/2024 0957    NO GROWTH 3 DAYS Performed at [facility], [address] [place], [place] [address]    REPTSTATUS PENDING 04/06/2024 0957    Procedures: Procedure(s) (LRB): AMPUTATION, FOOT, LEFT TRANSMETATARSAL (Left) Medications reviewed:  Scheduled Meds:  amLODipine   5 mg Oral Daily   cephALEXin   500 mg Oral Q8H   enoxaparin  (LOVENOX ) injection  40 mg Subcutaneous Q24H   gabapentin   300 mg Oral BID    insulin  aspart  0-15 Units Subcutaneous TID WC   insulin  aspart  0-5 Units Subcutaneous QHS   insulin  aspart  5 Units Subcutaneous TID WC   insulin  glargine-yfgn  20 Units Subcutaneous QHS   rosuvastatin   40 mg Oral Daily   Continuous Infusions:    Mennie LAMY, MD [facility] 04/09/2024, 10:49 AM

## 2024-04-10 ENCOUNTER — Other Ambulatory Visit (HOSPITAL_COMMUNITY): Payer: Self-pay

## 2024-04-10 ENCOUNTER — Encounter (HOSPITAL_COMMUNITY): Payer: Self-pay | Admitting: Podiatry

## 2024-04-10 DIAGNOSIS — M86172 Other acute osteomyelitis, left ankle and foot: Secondary | ICD-10-CM | POA: Diagnosis not present

## 2024-04-10 LAB — BASIC METABOLIC PANEL WITH GFR
Anion gap: 8 (ref 5–15)
BUN: 14 mg/dL (ref 6–20)
CO2: 25 mmol/L (ref 22–32)
Calcium: 9.2 mg/dL (ref 8.9–10.3)
Chloride: 101 mmol/L (ref 98–111)
Creatinine, Ser: 0.72 mg/dL (ref 0.61–1.24)
GFR, Estimated: >60 mL/min (ref >60)
Glucose, Bld: 268 mg/dL — ABNORMAL HIGH (ref 70–99)
Potassium: 4.1 mmol/L (ref 3.5–5.1)
Sodium: 134 mmol/L — ABNORMAL LOW (ref 135–145)

## 2024-04-10 LAB — CBC
HCT: 43.4 % (ref 39.0–52.0)
Hemoglobin: 15.1 g/dL (ref 13.0–17.0)
MCH: 28 pg (ref 26.0–34.0)
MCHC: 34.8 g/dL (ref 30.0–36.0)
MCV: 80.5 fL (ref 80.0–100.0)
Platelets: 187 K/uL (ref 150–400)
RBC: 5.39 MIL/uL (ref 4.22–5.81)
RDW: 11.9 % (ref 11.5–15.5)
WBC: 11.1 K/uL — ABNORMAL HIGH (ref 4.0–10.5)
nRBC: 0 % (ref 0.0–0.2)

## 2024-04-10 LAB — GLUCOSE, CAPILLARY
Glucose-Capillary: 297 mg/dL — ABNORMAL HIGH (ref 70–99)
Glucose-Capillary: 222 mg/dL — ABNORMAL HIGH (ref 70–99)

## 2024-04-10 LAB — SURGICAL PATHOLOGY

## 2024-04-10 MED ORDER — TRAMADOL HCL 50 MG PO TABS
50.0000 mg | ORAL_TABLET | Freq: Four times a day (QID) | ORAL | 0 refills | Status: AC | PRN
  Filled 2024-04-10: qty 12, 3d supply, fill #0

## 2024-04-10 MED ORDER — CEPHALEXIN 500 MG PO CAPS
500.0000 mg | ORAL_CAPSULE | Freq: Three times a day (TID) | ORAL | 0 refills | Status: AC
  Filled 2024-04-10: qty 14, 5d supply, fill #0

## 2024-04-10 NOTE — Discharge Summary (Signed)
 Physician Discharge Summary  Albert Mcdonald Albert Mcdonald:[id number] DOB: 1971/06/22 DOA: 04/06/2024  PCP: Terry Wilhelmena [name], FNP  Admit date: 04/06/2024 Discharge date: 04/10/2024 Recommendations for Outpatient Follow-up:  Follow up with PCP in 1 weeks-call for appointment Please obtain BMP/CBC in one week  Discharge Dispo: Home Discharge Condition: Stable Code Status:   Code Status: Full Code Diet recommendation:  Diet Order             Diet Carb Modified Fluid consistency: Thin; Room service appropriate? Yes  Diet effective now                    Brief/Interim Summary: 53 y.o. male with medical history significant of DM2, HTN, HLD and Kleine-Levin syndrome presented with left lower extremity 1st digit osteomyelitis. He was seen at Podiatrist's office ON 7/11 and found to have LLE 1st digit OM  and was advised to present to the ED. In the ED, pt hypertensive. Labs notable for lactic acid 2.1-->1.2, WBC 11, and glucose 379. MR LLE showed acute OM of L great tow. He was admitted to medicine with podiatry consulted> underwent left TMA.  Postop doing well, Plan is for PT OT and outpatient follow-up with podiatry  Subjective: Seen examined Has been ambulating despite telling him not to do weightbearing on the left leg.  He said he was not putting much weight Overnight afebrile BP stable blood sugar 160s-260s  Pain is controlled  Discharge diagnosis   Acute osteomyelitis LLE 1st digit and preulcerative lesion on the left fourth toe: MRI showed osteomyelitis of distal phalanx of great toe on left, soft tissue ulceration, no abscess, diffuse increased T2 signal of the intrinsic muscle could be chronic denervation changes or myositis, some tendinosis and trace tenosynovitis. S/p  LT TMA 7/14>PSOT OP DOING WELL: NWB in post op shoe to LLE ordered this AM (for protection - doesn't need to wear while in bed) PER PODIATRY: -Sutures: remain intact 2-3 weeks. -Medications/ABX: 5 days po  abx on dc -Dressing: Remain on dry and intact until follow up next week Tuesday podiatry will arrange f/u in clinic next week Cleared by PTOT for dc home.  DM2 with uncontrolled hyperglycemia, A1c 10.6: PTA on long acting insulin  cont at 20U ( at home takes 20-26), continue Semglee  20 u, Premeal 5u with  and SSI , cont gabapentin . Recent Labs  Lab 04/06/24 1438 04/06/24 1559 04/09/24 0826 04/09/24 0929 04/09/24 1225 04/09/24 1520 04/09/24 2106  GLUCAP  --    < > 183* 166* 199* 215* 165*  HGBA1C 10.6*  --   --   --   --   --   --    < > = values in this interval not displayed.    HTN BP well-controlled on home amlodipine    HLD Cont home Crestor    Procedure(s) (LRB): AMPUTATION, FOOT, LEFT TRANSMETATARSAL (Left)   Discharge Exam: Vitals:   04/10/24 1038 04/10/24 1203  BP: (!) 131/94 125/72  Pulse:  100  Resp:  20  Temp:  98 F (36.7 C)  SpO2:  96%   General: Pt is alert, awake, not in acute distress Cardiovascular: RRR, S1/S2 +, no rubs, no gallops Respiratory: CTA bilaterally, no wheezing, no rhonchi Abdominal: Soft, NT, ND, bowel sounds + Extremities: no edema, no cyanosis  Discharge Instructions  Discharge Instructions     Discharge instructions   Complete by: As directed    Please call call MD or return to ER for similar or worsening recurring problem  that brought you to hospital or if any fever,nausea/vomiting,abdominal pain, uncontrolled pain, chest pain,  shortness of breath or any other alarming symptoms.  Please follow-up your doctor as instructed in a week time and call the office for appointment.  Please avoid alcohol, smoking, or any other illicit substance and maintain healthy habits including taking your regular medications as prescribed.  You were cared for by a hospitalist during your hospital stay. If you have any questions about your discharge medications or the care you received while you were in the hospital after you are discharged, you can  call the unit and ask to speak with the hospitalist on call if the hospitalist that took care of you is not available.  Once you are discharged, your primary care physician will handle any further medical issues. Please note that NO REFILLS for any discharge medications will be authorized once you are discharged, as it is imperative that you return to your primary care physician (or establish a relationship with a primary care physician if you do not have one) for your aftercare needs so that they can reassess your need for medications and monitor your lab values   Discharge wound care:   Complete by: As directed    Follow-up with podiatry for further wound care in the clinic   Increase activity slowly   Complete by: As directed       Allergies as of 04/10/2024       Reactions   Ace Inhibitors Rash   Tolerates lisinopril         Medication List     TAKE these medications    amLODipine  5 MG tablet Commonly known as: NORVASC  Take 1 tablet (5 mg total) by mouth daily.   ammonium lactate  12 % lotion Commonly known as: Amlactin Daily Apply 1 Application topically as needed for dry skin.   cephALEXin  500 MG capsule Commonly known as: KEFLEX  Take 1 capsule (500 mg total) by mouth every 8 (eight) hours for 14 doses.   FreeStyle Libre 2 Plus Sensor Misc 1 each by Does not apply route daily.   FreeStyle Libre 2 Reader Ute Use to check blood sugar daily dx e11.65   gabapentin  300 MG capsule Commonly known as: NEURONTIN  Take 1 capsule (300 mg total) by mouth 2 (two) times daily.   Insulin  Pen Needle 32G X 4 MM Misc Use to inject insulin  once daily   Lantus  SoloStar 100 UNIT/ML Solostar Pen Generic drug: insulin  glargine Inject 20 Units into the skin at bedtime. What changed: how much to take   rosuvastatin  40 MG tablet Commonly known as: CRESTOR  Take 1 tablet (40 mg total) by mouth daily.   silver  sulfADIAZINE  1 % cream Commonly known as: Silvadene  Apply 1 Application  topically daily. What changed:  when to take this reasons to take this   traMADol  50 MG tablet Commonly known as: Ultram  Take 1 tablet (50 mg total) by mouth every 6 (six) hours as needed for up to 12 doses.   Vitamin D  (Ergocalciferol ) 1.25 MG (50000 UNIT) Caps capsule Commonly known as: DRISDOL  Take 1 capsule (50,000 Units total) by mouth every 7 (seven) days.               Discharge Care Instructions  (From admission, onward)           Start     Ordered   04/10/24 0000  Discharge wound care:       Comments: Follow-up with podiatry for further wound care in  the clinic   04/10/24 1100            Follow-up Information     Del Wilhelmena Falter, [place], FNP Follow up in 1 week(s).   Specialty: Family Medicine Contact information: [address] [place] [address] [phone]         Malvin Marsa FALCON, DPM Follow up in 3 day(s).   Specialty: Podiatry Contact information: [address] [place] [address] [phone]                Allergies  Allergen Reactions   Ace Inhibitors Rash    Tolerates lisinopril     The results of significant diagnostics from this hospitalization (including imaging, microbiology, ancillary and laboratory) are listed below for reference.    Microbiology: Recent Results (from the past 240 hours)  Blood culture (routine x 2)     Status: Abnormal   Collection Time: 04/06/24  9:52 AM   Specimen: BLOOD  Result Value Ref Range Status   Specimen Description BLOOD LEFT ANTECUBITAL  Final   Special Requests   Final    BOTTLES DRAWN AEROBIC AND ANAEROBIC Blood Culture adequate volume   Culture  Setup Time   Final    GRAM POSITIVE COCCI AEROBIC BOTTLE ONLY CRITICAL RESULT CALLED TO, READ BACK BY AND VERIFIED WITH: PHARMD J LEDFORD 04/07/2024 @ 2157 BY AB    Culture (A)  Final    STAPHYLOCOCCUS AURICULARIS THE SIGNIFICANCE OF ISOLATING THIS ORGANISM FROM A SINGLE SET OF BLOOD  CULTURES WHEN MULTIPLE SETS ARE DRAWN IS UNCERTAIN. PLEASE NOTIFY THE MICROBIOLOGY DEPARTMENT WITHIN ONE WEEK IF SPECIATION AND SENSITIVITIES ARE REQUIRED. Performed at [facility], [address], [place], [place] [address]    Report Status 04/09/2024 FINAL  Final  Blood Culture ID Panel (Reflexed)     Status: Abnormal   Collection Time: 04/06/24  9:52 AM  Result Value Ref Range Status   Enterococcus faecalis NOT DETECTED NOT DETECTED Final   Enterococcus Faecium NOT DETECTED NOT DETECTED Final   Listeria monocytogenes NOT DETECTED NOT DETECTED Final   Staphylococcus species DETECTED (A) NOT DETECTED Final    Comment: CRITICAL RESULT CALLED TO, READ BACK BY AND VERIFIED WITH: PHARMD J LEDFORD 04/07/2024 @ 2157 BY AB    Staphylococcus aureus (BCID) NOT DETECTED NOT DETECTED Final   Staphylococcus epidermidis NOT DETECTED NOT DETECTED Final   Staphylococcus lugdunensis NOT DETECTED NOT DETECTED Final   Streptococcus species NOT DETECTED NOT DETECTED Final   Streptococcus agalactiae NOT DETECTED NOT DETECTED Final   Streptococcus pneumoniae NOT DETECTED NOT DETECTED Final   Streptococcus pyogenes NOT DETECTED NOT DETECTED Final   A.calcoaceticus-baumannii NOT DETECTED NOT DETECTED Final   Bacteroides fragilis NOT DETECTED NOT DETECTED Final   Enterobacterales NOT DETECTED NOT DETECTED Final   Enterobacter cloacae complex NOT DETECTED NOT DETECTED Final   Escherichia coli NOT DETECTED NOT DETECTED Final   Klebsiella aerogenes NOT DETECTED NOT DETECTED Final   Klebsiella oxytoca NOT DETECTED NOT DETECTED Final   Klebsiella pneumoniae NOT DETECTED NOT DETECTED Final   Proteus species NOT DETECTED NOT DETECTED Final   Salmonella species NOT DETECTED NOT DETECTED Final   Serratia marcescens NOT DETECTED NOT DETECTED Final   Haemophilus influenzae NOT DETECTED NOT DETECTED Final   Neisseria meningitidis NOT DETECTED NOT DETECTED Final   Pseudomonas aeruginosa NOT DETECTED NOT DETECTED  Final   Stenotrophomonas maltophilia NOT DETECTED NOT DETECTED Final   Candida albicans NOT DETECTED NOT DETECTED  Final   Candida auris NOT DETECTED NOT DETECTED Final   Candida glabrata NOT DETECTED NOT DETECTED Final   Candida krusei NOT DETECTED NOT DETECTED Final   Candida parapsilosis NOT DETECTED NOT DETECTED Final   Candida tropicalis NOT DETECTED NOT DETECTED Final   Cryptococcus neoformans/gattii NOT DETECTED NOT DETECTED Final    Comment: Performed at [facility], [address], [place], [place] [address]  Blood culture (routine x 2)     Status: None (Preliminary result)   Collection Time: 04/06/24  9:57 AM   Specimen: BLOOD RIGHT HAND  Result Value Ref Range Status   Specimen Description BLOOD RIGHT HAND  Final   Special Requests   Final    BOTTLES DRAWN AEROBIC AND ANAEROBIC Blood Culture adequate volume   Culture   Final    NO GROWTH 4 DAYS Performed at [facility], [address] [place], [place] [address]    Report Status PENDING  Incomplete    Procedures/Studies: DG Foot 2 Views Left Result Date: 04/09/2024 CLINICAL DATA:  Status post left foot amputation. EXAM: LEFT FOOT - 2 VIEW COMPARISON:  April 06, 2024. FINDINGS: Status post transmetatarsal amputation of all 5 rays of left foot. Surgical staples are noted. No other definite bony abnormality is noted. IMPRESSION: Status post transmetatarsal amputation of all 5 rays of left foot. Electronically Signed   By: Lynwood Landy Raddle M.D.   On: 04/09/2024 10:47   MR FOOT LEFT WO CONTRAST Result Date: 04/06/2024 CLINICAL DATA:  Diabetic foot ulcer of the great toe. Concern for osteomyelitis. EXAM: MRI OF THE LEFT FOOT WITHOUT CONTRAST TECHNIQUE: Multiplanar, multisequence MR imaging of the left forefoot was performed. No intravenous contrast was administered. COMPARISON:  Same day radiographs of the left foot. MRI of the left foot dated 10/25/2023. FINDINGS: Bones/Joint/Cartilage T2/STIR hyperintense marrow signal  abnormality with confluent T1 hypointensity of the distal phalanx of the great toe, compatible with osteomyelitis. No marrow signal abnormality identified elsewhere to suggest osteomyelitis. Prior amputation of the second and third digits at the MTP joint level. No acute fracture dislocation. No joint effusion. Ligaments Lisfranc ligament is intact. No evidence of collateral ligament injury. Muscles and Tendons Diffusely increased T2 signal of the intrinsic musculature of the foot may reflect chronic denervation changes, however, myositis cannot be excluded. Flexor hallucis longus tendinosis with trace tenosynovitis. Soft tissue Soft tissue ulceration along the plantar aspect of the distal great toe with surrounding soft tissue edema and cutaneous thickening, compatible with cellulitis. No abscess. IMPRESSION: 1. Osteomyelitis of the distal phalanx of the great toe. 2. Soft tissue ulceration along the plantar aspect of the distal great toe with surrounding cellulitis. No abscess. 3. Diffusely increased T2 signal of the intrinsic musculature of the foot may reflect chronic denervation changes, however, myositis cannot be excluded. 4. Flexor hallucis longus tendinosis with trace tenosynovitis. 5. Prior amputation of the second and third digits at the MTP joint level. Electronically Signed   By: Harrietta Sherry M.D.   On: 04/06/2024 12:22   DG Foot Complete Left Result Date: 04/06/2024 Please see detailed radiograph report in office note.   Labs: BNP (last 3 results) No results for input(s): BNP in the last 8760 hours. Basic Metabolic Panel: Recent Labs  Lab 04/06/24 0929 04/07/24 0313 04/08/24 0558 04/09/24 0422 04/10/24 0412  NA 132* 133* 135 136 134*  K 4.4 4.1 4.0 4.1 4.1  CL 96* 102 102 102 101  CO2 24 28 24 27  25  GLUCOSE 394* 240* 213* 201* 268*  BUN 15 17 17 13 14   CREATININE 0.80 0.74 0.70 0.72 0.72  CALCIUM  9.4 8.8* 9.0 9.0 9.2   Liver Function Tests: Recent Labs  Lab  04/06/24 0929  AST 14*  ALT 15  ALKPHOS 103  BILITOT 0.8  PROT 7.7  ALBUMIN  3.8   No results for input(s): LIPASE, AMYLASE in the last 168 hours. No results for input(s): AMMONIA in the last 168 hours. CBC: Recent Labs  Lab 04/06/24 0929 04/07/24 0313 04/08/24 0558 04/09/24 0422 04/10/24 0412  WBC 11.0* 8.9 6.8 7.4 11.1*  NEUTROABS 7.5  --   --   --   --   HGB 16.2 15.2 14.9 14.9 15.1  HCT 47.1 44.1 42.3 42.9 43.4  MCV 82.3 81.1 80.1 80.6 80.5  PLT 209 191 159 185 187   CBG: Recent Labs  Lab 04/09/24 1225 04/09/24 1520 04/09/24 2106 04/10/24 0801 04/10/24 1210  GLUCAP 199* 215* 165* 297* 222*  Urinalysis    Component Value Date/Time   COLORURINE YELLOW 04/06/2024 0926   APPEARANCEUR CLEAR 04/06/2024 0926   LABSPEC >1.046 (H) 04/06/2024 0926   PHURINE 5.0 04/06/2024 0926   GLUCOSEU >=500 (A) 04/06/2024 0926   HGBUR NEGATIVE 04/06/2024 0926   BILIRUBINUR NEGATIVE 04/06/2024 0926   KETONESUR 5 (A) 04/06/2024 0926   PROTEINUR NEGATIVE 04/06/2024 0926   UROBILINOGEN 1.0 10/28/2009 0030   NITRITE NEGATIVE 04/06/2024 0926   LEUKOCYTESUR NEGATIVE 04/06/2024 0926   Sepsis Labs Recent Labs  Lab 04/07/24 0313 04/08/24 0558 04/09/24 0422 04/10/24 0412  WBC 8.9 6.8 7.4 11.1*   Microbiology Recent Results (from the past 240 hours)  Blood culture (routine x 2)     Status: Abnormal   Collection Time: 04/06/24  9:52 AM   Specimen: BLOOD  Result Value Ref Range Status   Specimen Description BLOOD LEFT ANTECUBITAL  Final   Special Requests   Final    BOTTLES DRAWN AEROBIC AND ANAEROBIC Blood Culture adequate volume   Culture  Setup Time   Final    GRAM POSITIVE COCCI AEROBIC BOTTLE ONLY CRITICAL RESULT CALLED TO, READ BACK BY AND VERIFIED WITH: PHARMD J LEDFORD 04/07/2024 @ 2157 BY AB    Culture (A)  Final    STAPHYLOCOCCUS AURICULARIS THE SIGNIFICANCE OF ISOLATING THIS ORGANISM FROM A SINGLE SET OF BLOOD CULTURES WHEN MULTIPLE SETS ARE DRAWN IS  UNCERTAIN. PLEASE NOTIFY THE MICROBIOLOGY DEPARTMENT WITHIN ONE WEEK IF SPECIATION AND SENSITIVITIES ARE REQUIRED. Performed at [facility], [address], [place], [place] [address]    Report Status 04/09/2024 FINAL  Final  Blood Culture ID Panel (Reflexed)     Status: Abnormal   Collection Time: 04/06/24  9:52 AM  Result Value Ref Range Status   Enterococcus faecalis NOT DETECTED NOT DETECTED Final   Enterococcus Faecium NOT DETECTED NOT DETECTED Final   Listeria monocytogenes NOT DETECTED NOT DETECTED Final   Staphylococcus species DETECTED (A) NOT DETECTED Final    Comment: CRITICAL RESULT CALLED TO, READ BACK BY AND VERIFIED WITH: PHARMD J LEDFORD 04/07/2024 @ 2157 BY AB    Staphylococcus aureus (BCID) NOT DETECTED NOT DETECTED Final   Staphylococcus epidermidis NOT DETECTED NOT DETECTED Final   Staphylococcus lugdunensis NOT DETECTED NOT DETECTED Final   Streptococcus species NOT DETECTED NOT DETECTED Final   Streptococcus agalactiae NOT DETECTED NOT DETECTED Final   Streptococcus pneumoniae NOT DETECTED NOT DETECTED Final   Streptococcus pyogenes NOT DETECTED NOT DETECTED Final   A.calcoaceticus-baumannii NOT  DETECTED NOT DETECTED Final   Bacteroides fragilis NOT DETECTED NOT DETECTED Final   Enterobacterales NOT DETECTED NOT DETECTED Final   Enterobacter cloacae complex NOT DETECTED NOT DETECTED Final   Escherichia coli NOT DETECTED NOT DETECTED Final   Klebsiella aerogenes NOT DETECTED NOT DETECTED Final   Klebsiella oxytoca NOT DETECTED NOT DETECTED Final   Klebsiella pneumoniae NOT DETECTED NOT DETECTED Final   Proteus species NOT DETECTED NOT DETECTED Final   Salmonella species NOT DETECTED NOT DETECTED Final   Serratia marcescens NOT DETECTED NOT DETECTED Final   Haemophilus influenzae NOT DETECTED NOT DETECTED Final   Neisseria meningitidis NOT DETECTED NOT DETECTED Final   Pseudomonas aeruginosa NOT DETECTED NOT DETECTED Final   Stenotrophomonas maltophilia  NOT DETECTED NOT DETECTED Final   Candida albicans NOT DETECTED NOT DETECTED Final   Candida auris NOT DETECTED NOT DETECTED Final   Candida glabrata NOT DETECTED NOT DETECTED Final   Candida krusei NOT DETECTED NOT DETECTED Final   Candida parapsilosis NOT DETECTED NOT DETECTED Final   Candida tropicalis NOT DETECTED NOT DETECTED Final   Cryptococcus neoformans/gattii NOT DETECTED NOT DETECTED Final    Comment: Performed at [facility], [address] [place], [place] [address]  Blood culture (routine x 2)     Status: None (Preliminary result)   Collection Time: 04/06/24  9:57 AM   Specimen: BLOOD RIGHT HAND  Result Value Ref Range Status   Specimen Description BLOOD RIGHT HAND  Final   Special Requests   Final    BOTTLES DRAWN AEROBIC AND ANAEROBIC Blood Culture adequate volume   Culture   Final    NO GROWTH 4 DAYS Performed at [facility], [address] [place], [place] [address]    Report Status PENDING  Incomplete     Time coordinating discharge: 35 minutes  SIGNED: Mennie LAMY, MD  [facility] 04/10/2024, 2:57 PM  If 7PM-7AM, please contact night-coverage [email or web address]

## 2024-04-10 NOTE — Progress Notes (Signed)
  Subjective:  Patient ID: Albert Mcdonald, male    DOB: [birth year],  MRN: [id number]  Chief Complaint  Patient presents with   Osteomyelitis     DOS: 04/09/24 Procedure: 1. Transmetatarsal amputation of the left foot 2. Application of amniotic graft 5x5 cm, left foot     53 y.o. male seen for post op check. He reports he is doing well aware of need to be nonweightbearing to left foot. Says he went to bathroom with aid of walker. Says pain is mostly controlled. Aware of findings and follow up plans.  Review of Systems: Negative except as noted in the HPI. Denies N/V/F/Ch.   Objective:   Constitutional Well developed. Well nourished.  Vascular Foot warm and well perfused. Capillary refill normal to all digits.   No calf pain with palpation  Neurologic Normal speech. Oriented to person, place, and time. Epicritic sensation diminished  Dermatologic Dressing C/D/I to LLE  Orthopedic: S/p L foot TMA   Radiographs: Status post transmetatarsal amputation of all 5 rays of left foot.   Pathology: pending  Micro: NA  Assessment:   1. Osteomyelitis of toe (HCC)   S/p L foot TMA with amniotic graft application  Plan:  Patient was evaluated and treated and all questions answered.  POD # 1 s/p L foot TMA with  -Progressing as expected post op, pain controlled no concerns. Awaiting PT eval -XR: expected post op changes -WB Status: NWB in post op shoe to LLE ordered this AM (for protection - doesn't need to wear while in bed) -Sutures: remain intact 2-3 weeks. -Medications/ABX: 5 days po abx on dc -Dressing: Remain on dry and intact until follow up next week tuesday - F/u Plan: stable for DC when pt clears PT eval for home. NWB LLE. Pt to follow up next week Tuesday office will call to set up. Will sign off please msg with questions.         Marolyn JULIANNA Honour, DPM [facility] / [facility]

## 2024-04-10 NOTE — Evaluation (Signed)
 Occupational Therapy Evaluation Patient Details Name: Albert Mcdonald MRN: [id number] DOB: 12-20-1970 Today's Date: 04/10/2024   History of Present Illness   Pt is a 53 y.o. male admitted 7/11 with L great toe osteomyelitis. He underwent L transmet amp 7/14. 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 Pt presents now able to manage ADLs/mobility using RW though does need cues for safety/WB due to impulsivity with activity. Educated re: fall prevention for LB ADLs, safe bathing techniques, importance of post op shoe/NWB status and IADL assist. Pt reports having a knee scooter at home and plans to use this for mobility.     If plan is discharge home, recommend the following:   Assistance with cooking/housework;Assist for transportation;Other (comment) (PRN)     Functional Status Assessment   Patient has had a recent decline in their functional status and demonstrates the ability to make significant improvements in function in a reasonable and predictable amount of time.     Equipment Recommendations   None recommended by OT     Recommendations for Other Services         Precautions/Restrictions   Precautions Precautions: Fall Required Braces or Orthoses: Other Brace Other Brace: post op shoe Restrictions Weight Bearing Restrictions Per Provider Order: Yes LLE Weight Bearing Per Provider Order: Non weight bearing     Mobility Bed Mobility Overal bed mobility: Independent                  Transfers Overall transfer level: Modified independent Equipment used: Standard walker, Rolling walker (2 wheels) Transfers: Sit to/from Stand Sit to Stand: Modified independent (Device/Increase time)                  Balance Overall balance assessment: No apparent balance deficits (not formally assessed)                                          ADL either performed or assessed with clinical judgement   ADL Overall ADL's : Modified independent                                       General ADL Comments: Pt able to manage dressing, reach to post op shoe and mobilize in room using RW without assist. However, cues needed for safety and slower pacing. discussed importance of maintaining NWB LLE for optimal healing, decreasing fall risk. Educated re: LB dressing techniques to decrease fall risk, covering L foot for showers pending surgeon's recommendations, assist for IADLs     Vision Ability to See in Adequate Light: 0 Adequate Patient Visual Report: No change from baseline Vision Assessment?: No apparent visual deficits     Perception         Praxis         Pertinent Vitals/Pain Pain Assessment Pain Assessment: Faces Faces Pain Scale: Hurts a little bit Pain Location: L foot Pain Descriptors / Indicators: Tightness Pain Intervention(s): Monitored during session     Extremity/Trunk Assessment Upper Extremity Assessment Upper Extremity Assessment: Overall WFL for tasks assessed;Right hand dominant   Lower Extremity Assessment Lower Extremity Assessment: Defer to PT evaluation LLE Deficits / Details: s/p transmet  amp   Cervical / Trunk Assessment Cervical / Trunk Assessment: Normal   Communication Communication Communication: No apparent difficulties   Cognition Arousal: Alert Behavior During Therapy: WFL for tasks assessed/performed, Impulsive Cognition: No family/caregiver present to determine baseline             OT - Cognition Comments: likely at baseline. very quick with movements, impulsive at times with cues needed to slow down                 Following commands: Intact       Cueing  General Comments   Cueing Techniques: Verbal cues      Exercises     Shoulder Instructions      Home Living Family/patient expects to  be discharged to:: Private residence Living Arrangements: Parent Available Help at Discharge: Family Type of Home: Apartment Home Access: Level entry     Home Layout: One level     Bathroom Shower/Tub: Chief Strategy Officer: Standard     Home Equipment: Other (comment) (knee scooter)          Prior Functioning/Environment Prior Level of Function : Independent/Modified Independent             Mobility Comments: no AD for mobility typically ADLs Comments: Indep with ADLs, IADLs    OT Problem List: Decreased safety awareness   OT Treatment/Interventions: Self-care/ADL training;Therapeutic exercise;Energy conservation;DME and/or AE instruction;Therapeutic activities;Patient/family education;Balance training      OT Goals(Current goals can be found in the care plan section)   Acute Rehab OT Goals Patient Stated Goal: home today OT Goal Formulation: With patient Time For Goal Achievement: 04/24/24 Potential to Achieve Goals: Good   OT Frequency:  Min 1X/week    Co-evaluation              AM-PAC OT 6 Clicks Daily Activity     Outcome Measure Help from another person eating meals?: None Help from another person taking care of personal grooming?: None Help from another person toileting, which includes using toliet, bedpan, or urinal?: None Help from another person bathing (including washing, rinsing, drying)?: None Help from another person to put on and taking off regular upper body clothing?: None Help from another person to put on and taking off regular lower body clothing?: None 6 Click Score: 24   End of Session Equipment Utilized During Treatment: Rolling walker (2 wheels) Nurse Communication: Mobility status  Activity Tolerance: Patient tolerated treatment well Patient left: in bed;with call bell/phone within reach  OT Visit Diagnosis: Other abnormalities of gait and mobility (R26.89)                Time: 8762-8746 OT Time  Calculation (min): 16 min Charges:  OT General Charges $OT Visit: 1 Visit OT Evaluation $OT Eval Low Complexity: 1 Low  Mliss NOVAK, OTR/L Acute Rehab Services Office: [phone]   Mliss Fish 04/10/2024, 1:36 PM

## 2024-04-10 NOTE — Plan of Care (Signed)
  Problem: Education: Goal: Knowledge of General Education information will improve Description: Including pain rating scale, medication(s)/side effects and non-pharmacologic comfort measures Outcome: Adequate for Discharge   Problem: Health Behavior/Discharge Planning: Goal: Ability to manage health-related needs will improve Outcome: Adequate for Discharge   Problem: Clinical Measurements: Goal: Ability to maintain clinical measurements within normal limits will improve Outcome: Adequate for Discharge Goal: Will remain free from infection Outcome: Adequate for Discharge Goal: Diagnostic test results will improve Outcome: Adequate for Discharge Goal: Respiratory complications will improve Outcome: Adequate for Discharge Goal: Cardiovascular complication will be avoided Outcome: Adequate for Discharge   Problem: Activity: Goal: Risk for activity intolerance will decrease Outcome: Adequate for Discharge   Problem: Nutrition: Goal: Adequate nutrition will be maintained Outcome: Adequate for Discharge   Problem: Coping: Goal: Level of anxiety will decrease Outcome: Adequate for Discharge   Problem: Elimination: Goal: Will not experience complications related to bowel motility Outcome: Adequate for Discharge Goal: Will not experience complications related to urinary retention Outcome: Adequate for Discharge   Problem: Pain Managment: Goal: General experience of comfort will improve and/or be controlled Outcome: Adequate for Discharge   Problem: Safety: Goal: Ability to remain free from injury will improve Outcome: Adequate for Discharge   Problem: Skin Integrity: Goal: Risk for impaired skin integrity will decrease Outcome: Adequate for Discharge   Problem: Education: Goal: Ability to describe self-care measures that may prevent or decrease complications (Diabetes Survival Skills Education) will improve Outcome: Adequate for Discharge Goal: Individualized Educational  Video(s) Outcome: Adequate for Discharge   Problem: Coping: Goal: Ability to adjust to condition or change in health will improve Outcome: Adequate for Discharge   Problem: Fluid Volume: Goal: Ability to maintain a balanced intake and output will improve Outcome: Adequate for Discharge   Problem: Health Behavior/Discharge Planning: Goal: Ability to identify and utilize available resources and services will improve Outcome: Adequate for Discharge Goal: Ability to manage health-related needs will improve Outcome: Adequate for Discharge   Problem: Metabolic: Goal: Ability to maintain appropriate glucose levels will improve Outcome: Adequate for Discharge   Problem: Nutritional: Goal: Maintenance of adequate nutrition will improve Outcome: Adequate for Discharge Goal: Progress toward achieving an optimal weight will improve Outcome: Adequate for Discharge   Problem: Skin Integrity: Goal: Risk for impaired skin integrity will decrease Outcome: Adequate for Discharge   Problem: Tissue Perfusion: Goal: Adequacy of tissue perfusion will improve Outcome: Adequate for Discharge   Problem: Acute Rehab PT Goals(only PT should resolve) Goal: Patient Will Transfer Sit To/From Stand Outcome: Adequate for Discharge Goal: Pt Will Ambulate Outcome: Adequate for Discharge   Problem: Acute Rehab OT Goals (only OT should resolve) Goal: Pt. Will Transfer To Toilet Outcome: Adequate for Discharge Goal: OT Additional ADL Goal #1 Outcome: Adequate for Discharge

## 2024-04-10 NOTE — Evaluation (Addendum)
 Physical Therapy Evaluation Patient Details Name: Albert Mcdonald MRN: [id number] DOB: 13-Dec-1970 Today's Date: 04/10/2024  History of Present Illness  Pt is a 53 y.o. male admitted 7/11 with L great toe osteomyelitis. He underwent L transmet amp 7/14. 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 PTA pt lived in 1st floor apartment with his mother, independent mobility/ADLs.  Pt currently with functional limitations due to the deficits listed below (see PT Problem List). On eval, pt demo independent bed mobility. Supervision transfers, and supervision amb 30' with RW. Cues needed to maintain NWB LLE. Pt reports plan to use his knee scooter at home. Educated on need to keep LLE elevated at rest. Pt will benefit from acute skilled PT to increase their independence and safety with mobility to allow discharge. PT to follow acutely. No follow up services indicated.          If plan is discharge home, recommend the following: Assist for transportation   Can travel by private vehicle        Equipment Recommendations Rolling walker (2 wheels)  Recommendations for Other Services       Functional Status Assessment Patient has had a recent decline in their functional status and demonstrates the ability to make significant improvements in function in a reasonable and predictable amount of time.     Precautions / Restrictions Precautions Precautions: Fall Recall of Precautions/Restrictions: Intact Required Braces or Orthoses: Other Brace Other Brace: post op shoe Restrictions Weight Bearing Restrictions Per Provider Order: Yes LLE Weight Bearing Per Provider Order: Non weight bearing      Mobility  Bed Mobility Overal bed mobility: Independent                  Transfers Overall transfer level: Needs assistance Equipment used: Standard walker Transfers: Sit to/from Stand Sit to Stand:  Supervision                Ambulation/Gait Ambulation/Gait assistance: Supervision Gait Distance (Feet): 30 Feet Assistive device: Standard walker Gait Pattern/deviations: Step-to pattern Gait velocity: decreased Gait velocity interpretation: <1.31 ft/sec, indicative of household ambulator   General Gait Details: hop-to gait. Cues to maintain NWB LLE. Pt reports plan to use knee scooter at home.  Stairs            Wheelchair Mobility     Tilt Bed    Modified Rankin (Stroke Patients Only)       Balance Overall balance assessment: No apparent balance deficits (not formally assessed)                                           Pertinent Vitals/Pain Pain Assessment Pain Assessment: No/denies pain    Home Living Family/patient expects to be discharged to:: Private residence Living Arrangements: Parent Available Help at Discharge: Family Type of Home: Apartment Home Access: Level entry       Home Layout: One level Home Equipment: Other (comment) (knee scooter)      Prior Function Prior Level of Function : Independent/Modified Independent                     Extremity/Trunk Assessment   Upper Extremity Assessment Upper Extremity Assessment: Overall WFL for tasks assessed    Lower Extremity Assessment Lower Extremity Assessment: LLE deficits/detail LLE Deficits /  Details: s/p transmet amp    Cervical / Trunk Assessment Cervical / Trunk Assessment: Normal  Communication   Communication Communication: No apparent difficulties    Cognition Arousal: Alert Behavior During Therapy: WFL for tasks assessed/performed   PT - Cognitive impairments: No apparent impairments                         Following commands: Intact       Cueing Cueing Techniques: Verbal cues     General Comments      Exercises     Assessment/Plan    PT Assessment Patient needs continued PT services  PT Problem List Decreased  mobility;Decreased knowledge of use of DME;Decreased safety awareness;Decreased knowledge of precautions       PT Treatment Interventions DME instruction;Gait training;Functional mobility training;Therapeutic activities;Patient/family education    PT Goals (Current goals can be found in the Care Plan section)  Acute Rehab PT Goals Patient Stated Goal: home PT Goal Formulation: With patient Time For Goal Achievement: 04/24/24 Potential to Achieve Goals: Good    Frequency Min 2X/week     Co-evaluation               AM-PAC PT 6 Clicks Mobility  Outcome Measure Help needed turning from your back to your side while in a flat bed without using bedrails?: None Help needed moving from lying on your back to sitting on the side of a flat bed without using bedrails?: None Help needed moving to and from a bed to a chair (including a wheelchair)?: A Little Help needed standing up from a chair using your arms (e.g., wheelchair or bedside chair)?: A Little Help needed to walk in hospital room?: A Little Help needed climbing 3-5 steps with a railing? : A Lot 6 Click Score: 19    End of Session   Activity Tolerance: Patient tolerated treatment well Patient left: in bed;with call bell/phone within reach Nurse Communication: Mobility status PT Visit Diagnosis: Difficulty in walking, not elsewhere classified (R26.2)    Time: 8887-8875 PT Time Calculation (min) (ACUTE ONLY): 12 min   Charges:   PT Evaluation $PT Eval Low Complexity: 1 Low   PT General Charges $$ ACUTE PT VISIT: 1 Visit         Sari MATSU., PT  Office # [phone]   Erven Sari Shaker 04/10/2024, 11:45 AM

## 2024-04-10 NOTE — Progress Notes (Signed)
 Orthopedic Tech Progress Note Patient Details:  KELVON GIANNINI 08/09/1971 [id number]  Ortho Devices Type of Ortho Device: Postop shoe/boot Ortho Device/Splint Location: LLE Ortho Device/Splint Interventions: Ordered, Application, Adjustment   Post Interventions Patient Tolerated: Well Instructions Provided: Care of device  Delanna LITTIE Pac 04/10/2024, 1:04 PM

## 2024-04-11 ENCOUNTER — Telehealth: Payer: Self-pay

## 2024-04-11 ENCOUNTER — Other Ambulatory Visit: Payer: Self-pay | Admitting: Family Medicine

## 2024-04-11 LAB — CULTURE, BLOOD (ROUTINE X 2)
Culture: NO GROWTH
Special Requests: ADEQUATE

## 2024-04-11 NOTE — Transitions of Care (Post Inpatient/ED Visit) (Signed)
   04/11/2024  Name: ISHAQ MAFFEI MRN: [id number] DOB: [birth year]  Today's TOC FU Call Status: Today's TOC FU Call Status:: Unsuccessful Call (1st Attempt) Unsuccessful Call (1st Attempt) Date: 04/11/24  Attempted to reach the patient regarding the most recent Inpatient/ED visit. Call [phone].   Follow Up Plan: Additional outreach attempts will be made to reach the patient to complete the Transitions of Care (Post Inpatient/ED visit) call.    Bing Edison MSN, RN RN Case Sales executive Health  [facility]-Population Health Office Hours M-F [phone] Direct Dial: [phone] Main Phone [phone]  Fax: [phone] [email or web address]

## 2024-04-12 ENCOUNTER — Encounter (HOSPITAL_COMMUNITY): Payer: Self-pay | Admitting: Podiatry

## 2024-04-12 ENCOUNTER — Telehealth: Payer: Self-pay

## 2024-04-12 NOTE — Anesthesia Postprocedure Evaluation (Signed)
 Anesthesia Post Note  Patient: Albert Mcdonald  Procedure(s) Performed: AMPUTATION, FOOT, LEFT TRANSMETATARSAL (Left: Foot)     Patient location during evaluation: PACU Anesthesia Type: Regional and MAC Level of consciousness: awake and alert Pain management: pain level controlled Vital Signs Assessment: post-procedure vital signs reviewed and stable Respiratory status: spontaneous breathing, nonlabored ventilation, respiratory function stable and patient connected to nasal cannula oxygen Cardiovascular status: stable and blood pressure returned to baseline Postop Assessment: no apparent nausea or vomiting Anesthetic complications: no   No notable events documented.  Last Vitals:  Vitals:   04/10/24 1038 04/10/24 1203  BP: (!) 131/94 125/72  Pulse:  100  Resp:  20  Temp:  36.7 C  SpO2:  96%    Last Pain:  Vitals:   04/10/24 1235  TempSrc:   PainSc: 0-No pain                 [name] S

## 2024-04-12 NOTE — Transitions of Care (Post Inpatient/ED Visit) (Signed)
   04/12/2024  Name: PIKE SCANTLEBURY MRN: [id number] DOB: 08-Sep-1971  Today's TOC FU Call Status: Today's TOC FU Call Status:: Unsuccessful Call (2nd Attempt) Unsuccessful Call (2nd Attempt) Date: 04/12/24  Attempted to reach the patient regarding the most recent Inpatient/ED visit.  Follow Up Plan: Additional outreach attempts will be made to reach the patient to complete the Transitions of Care (Post Inpatient/ED visit) call.    Bing Edison MSN, RN RN Case Sales executive Health  [facility]Population Health Office Hours M-F [phone] Direct Dial: [phone] Main Phone [phone]  Fax: [phone] [email or web address]

## 2024-04-13 ENCOUNTER — Telehealth: Payer: Self-pay

## 2024-04-13 NOTE — Transitions of Care (Post Inpatient/ED Visit) (Signed)
   04/13/2024  Name: Albert Mcdonald MRN: [id number] DOB: 05/24/1971  Today's TOC FU Call Status: Today's TOC FU Call Status:: Unsuccessful Call (3rd Attempt) Unsuccessful Call (1st Attempt) Date: 04/11/24 Unsuccessful Call (2nd Attempt) Date: 04/12/24 Unsuccessful Call (3rd Attempt) Date: 04/13/24  Attempted to reach the patient regarding the most recent Inpatient/ED visit.  Follow Up Plan: No further outreach attempts will be made at this time. We have been unable to contact the patient.  Medford Balboa, BSN, RN [place]  VBCI - [employer] [facility] RN Care Manager [phone]

## 2024-04-16 DIAGNOSIS — I10 Essential (primary) hypertension: Secondary | ICD-10-CM | POA: Diagnosis not present

## 2024-04-16 DIAGNOSIS — Z7985 Long-term (current) use of injectable non-insulin antidiabetic drugs: Secondary | ICD-10-CM | POA: Diagnosis not present

## 2024-04-16 DIAGNOSIS — Z89421 Acquired absence of other right toe(s): Secondary | ICD-10-CM | POA: Diagnosis not present

## 2024-04-16 DIAGNOSIS — Z7984 Long term (current) use of oral hypoglycemic drugs: Secondary | ICD-10-CM | POA: Diagnosis not present

## 2024-04-16 DIAGNOSIS — Z981 Arthrodesis status: Secondary | ICD-10-CM | POA: Diagnosis not present

## 2024-04-16 DIAGNOSIS — G4713 Recurrent hypersomnia: Secondary | ICD-10-CM | POA: Diagnosis not present

## 2024-04-16 DIAGNOSIS — Z794 Long term (current) use of insulin: Secondary | ICD-10-CM | POA: Diagnosis not present

## 2024-04-16 DIAGNOSIS — G2581 Restless legs syndrome: Secondary | ICD-10-CM | POA: Diagnosis not present

## 2024-04-16 DIAGNOSIS — Z9181 History of falling: Secondary | ICD-10-CM | POA: Diagnosis not present

## 2024-04-16 DIAGNOSIS — E1140 Type 2 diabetes mellitus with diabetic neuropathy, unspecified: Secondary | ICD-10-CM | POA: Diagnosis not present

## 2024-04-16 DIAGNOSIS — L03032 Cellulitis of left toe: Secondary | ICD-10-CM | POA: Diagnosis not present

## 2024-04-16 DIAGNOSIS — Z89422 Acquired absence of other left toe(s): Secondary | ICD-10-CM | POA: Diagnosis not present

## 2024-04-16 DIAGNOSIS — E785 Hyperlipidemia, unspecified: Secondary | ICD-10-CM | POA: Diagnosis not present

## 2024-04-16 DIAGNOSIS — L02612 Cutaneous abscess of left foot: Secondary | ICD-10-CM | POA: Diagnosis not present

## 2024-04-16 DIAGNOSIS — G1221 Amyotrophic lateral sclerosis: Secondary | ICD-10-CM | POA: Diagnosis not present

## 2024-04-18 ENCOUNTER — Ambulatory Visit: Payer: Self-pay | Admitting: *Deleted

## 2024-04-18 ENCOUNTER — Telehealth: Payer: Self-pay

## 2024-04-18 NOTE — Telephone Encounter (Signed)
 Reason for Disposition  Blood glucose 70-240 mg/dL (3.9 -86.6 mmol/L)  Answer Assessment - Initial Assessment Questions Patient blood glucose 177 now. Reports he has been taking lantus  20 units at bedtime but has taken more than once per day if blood glucose higher than 212-300 if needed. Educated patient lantus  to be taken at hs and PCP will evaluate if additional medication should be taken to manage blood glucose. Denies other sx . Appt already scheduled for hospital f/u 04/24/24. Please advise if additional medication needed.         1. BLOOD GLUCOSE: What is your blood glucose level?      Running between 212-300's reports today 177 now  2. ONSET: When did you check the blood glucose?     Now  3. USUAL RANGE: What is your glucose level usually? (e.g., usual fasting morning value, usual evening value)     200's 4. KETONES: Do you check for ketones (urine or blood test strips)? If Yes, ask: What does the test show now?      na 5. TYPE 1 or 2:  Do you know what type of diabetes you have?  (e.g., Type 1, Type 2, Gestational; doesn't know)      na 6. INSULIN : Do you take insulin ? What type of insulin (s) do you use? What is the mode of delivery? (syringe, pen; injection or pump)?      Yes insulin  20 units at bedtime. Sometimes take 26 units or take more than 1 time a day if sugar levels elevated  7. DIABETES PILLS: Do you take any pills for your diabetes? If Yes, ask: Have you missed taking any pills recently?     No  8. OTHER SYMPTOMS: Do you have any symptoms? (e.g., fever, frequent urination, difficulty breathing, dizziness, weakness, vomiting)     Denies sx now  9. PREGNANCY: Is there any chance you are pregnant? When was your last menstrual period?     na  Protocols used: Diabetes - High Blood Sugar-A-AH

## 2024-04-18 NOTE — Telephone Encounter (Signed)
 Copied from CRM [id number]. Topic: Clinical - Medication Question >> Apr 18, 2024 11:11 AM Tobias CROME wrote: Reason for RMF:Gzddprj w/ [facility] calling to report a discrepancy with patient's medication. Patient has the Lantus  20 units once a day at bedtime, patient informed Harlene he sometimes takes it twice a day as his blood sugar levels have been 212-300 since having operation done. Patient had amputation of toes completed & does have hospital f/u scheduled with provider.   Also sending [employer] CRM to NT to follow up w/ patient.

## 2024-04-19 ENCOUNTER — Ambulatory Visit (INDEPENDENT_AMBULATORY_CARE_PROVIDER_SITE_OTHER): Admitting: Podiatry

## 2024-04-19 DIAGNOSIS — Z9889 Other specified postprocedural states: Secondary | ICD-10-CM

## 2024-04-19 DIAGNOSIS — M86172 Other acute osteomyelitis, left ankle and foot: Secondary | ICD-10-CM

## 2024-04-19 MED ORDER — AMOXICILLIN-POT CLAVULANATE 875-125 MG PO TABS: 1.0000 | ORAL_TABLET | Freq: Two times a day (BID) | ORAL | 0 refills | Status: DC

## 2024-04-19 MED ORDER — TRAMADOL HCL 50 MG PO TABS: 50.0000 mg | ORAL_TABLET | Freq: Three times a day (TID) | ORAL | 0 refills | Status: AC | PRN

## 2024-04-19 NOTE — Progress Notes (Signed)
  Subjective:  Patient ID: Albert Mcdonald, male    DOB: [birth year],  MRN: [id number]  Chief Complaint  Patient presents with   Post-op Follow-up    Pain level 6/10. Surgical site intact, staples and sutures present. Using a knee scooter.     DOS: 04/09/24 Procedure: 1. Transmetatarsal amputation of the left foot 2. Application of amniotic graft 5x5 cm, left foot     53 y.o. male seen for post op check. He reports he is doing well aware of need to be nonweightbearing to left foot. Says he went to bathroom with aid of walker. Says pain is mostly controlled. Aware of findings and follow up plans.  Review of Systems: Negative except as noted in the HPI. Denies N/V/F/Ch.   Objective:   Constitutional Well developed. Well nourished.  Vascular Foot warm and well perfused. Capillary refill normal to all digits.   No calf pain with palpation  Neurologic Normal speech. Oriented to person, place, and time. Epicritic sensation diminished  Dermatologic L foot amputation site well coapted no dehiscence mild to moderate erythema and serous drainage at central aspect   Orthopedic: S/p L foot TMA   Radiographs: Status post transmetatarsal amputation of all 5 rays of left foot.   Pathology:  A. FOOT, LEFT TRANSMETATARSAL, AMPUTATION:  - Soft tissue necrosis with underlying acute osteomyelitis.  - Proximal margin with cut surfaces of bone; negative for acute osteomyelitis.  - Status post amputation of 2nd and 3rd toes.   Micro: NA  Assessment:   1. Acute osteomyelitis of left ankle or foot (HCC)   2. Post-operative state    S/p L foot TMA with amniotic graft application  Plan:  Patient was evaluated and treated and all questions answered.  1.5 weeks s/p L foot TMA with  -Progressing as expected post op, there is some mild erythema and drainage at the surgical site. Will extend abx and he will have home RN perofrm dressing changes with dry gauze 2-3 x weekly.  -XR: expected post  op changes -WB Status: NWB in post op shoe to LLE , using knee scooter  -Sutures: remain intact 2-3 weeks. -Medications/ABX: recommend 10 days additional abx and will send refill of tramadol   -Dressing: Remain on dry and intact for 5 days then 2x weekly dry gauze dressing change - F/u Plan: f/u on 05/01/24        Albert Mcdonald, DPM [facility]

## 2024-04-19 NOTE — Telephone Encounter (Signed)
 Please advise on BG levels. OV scheduled 04/24/24. Thank you!

## 2024-04-19 NOTE — Telephone Encounter (Signed)
We will discuss at upcoming office visit

## 2024-04-23 DIAGNOSIS — Z981 Arthrodesis status: Secondary | ICD-10-CM | POA: Diagnosis not present

## 2024-04-23 DIAGNOSIS — Z794 Long term (current) use of insulin: Secondary | ICD-10-CM | POA: Diagnosis not present

## 2024-04-23 DIAGNOSIS — Z7985 Long-term (current) use of injectable non-insulin antidiabetic drugs: Secondary | ICD-10-CM | POA: Diagnosis not present

## 2024-04-23 DIAGNOSIS — L03032 Cellulitis of left toe: Secondary | ICD-10-CM | POA: Diagnosis not present

## 2024-04-23 DIAGNOSIS — Z7984 Long term (current) use of oral hypoglycemic drugs: Secondary | ICD-10-CM | POA: Diagnosis not present

## 2024-04-23 DIAGNOSIS — Z9181 History of falling: Secondary | ICD-10-CM | POA: Diagnosis not present

## 2024-04-23 DIAGNOSIS — I10 Essential (primary) hypertension: Secondary | ICD-10-CM | POA: Diagnosis not present

## 2024-04-23 DIAGNOSIS — G4713 Recurrent hypersomnia: Secondary | ICD-10-CM | POA: Diagnosis not present

## 2024-04-23 DIAGNOSIS — G1221 Amyotrophic lateral sclerosis: Secondary | ICD-10-CM | POA: Diagnosis not present

## 2024-04-23 DIAGNOSIS — L02612 Cutaneous abscess of left foot: Secondary | ICD-10-CM | POA: Diagnosis not present

## 2024-04-23 DIAGNOSIS — E785 Hyperlipidemia, unspecified: Secondary | ICD-10-CM | POA: Diagnosis not present

## 2024-04-23 DIAGNOSIS — Z89421 Acquired absence of other right toe(s): Secondary | ICD-10-CM | POA: Diagnosis not present

## 2024-04-23 DIAGNOSIS — G2581 Restless legs syndrome: Secondary | ICD-10-CM | POA: Diagnosis not present

## 2024-04-23 DIAGNOSIS — E1140 Type 2 diabetes mellitus with diabetic neuropathy, unspecified: Secondary | ICD-10-CM | POA: Diagnosis not present

## 2024-04-23 DIAGNOSIS — Z89422 Acquired absence of other left toe(s): Secondary | ICD-10-CM | POA: Diagnosis not present

## 2024-04-24 ENCOUNTER — Other Ambulatory Visit: Payer: Self-pay | Admitting: Family Medicine

## 2024-04-24 ENCOUNTER — Ambulatory Visit: Payer: Self-pay

## 2024-04-24 VITALS — BP 118/82 | HR 96 | Ht 73.0 in | Wt 216.0 lb

## 2024-04-24 DIAGNOSIS — B029 Zoster without complications: Secondary | ICD-10-CM

## 2024-04-24 DIAGNOSIS — E118 Type 2 diabetes mellitus with unspecified complications: Secondary | ICD-10-CM | POA: Diagnosis not present

## 2024-04-24 MED ORDER — FREESTYLE LIBRE 2 PLUS SENSOR MISC: 1.0000 | 8 refills | Status: AC

## 2024-04-24 MED ORDER — ACYCLOVIR 800 MG PO TABS: 800.0000 mg | ORAL_TABLET | Freq: Three times a day (TID) | ORAL | 0 refills | Status: AC

## 2024-04-24 NOTE — Assessment & Plan Note (Addendum)
 Lab Results  Component Value Date   HGBA1C 10.6 (H) 04/06/2024   HGBA1C 11.8 (H) 12/09/2023   HGBA1C 11.9 (H) 10/24/2023   Recent A1c showed some improvement in blood sugar levels, however still remains quite high.  He states that he has been taking his Lantus  twice a day due to elevated blood sugar readings, and in light of recent surgery to amputate left toes.  Advised to continue with Lantus  20 units twice a day until he is more ambulatory and his blood sugar levels start to decrease.  Continue to work on low-carb, low sugar diet. CGM sensors refilled today.  Recommend follow-up in 3 months or sooner if blood sugars are remaining above 250.

## 2024-04-24 NOTE — Telephone Encounter (Unsigned)
 Copied from CRM [id number]. Topic: Clinical - Medication Refill >> Apr 24, 2024  4:50 PM Turkey B wrote: Medication: Continuous Glucose Sensor (FREESTYLE LIBRE 2 PLUS SENSOR) MISC Shows went to pharmacy this morning but pt states pharmacy doesn't have it  Has the patient contacted their pharmacy? yes (Agent: If yes, when and what did the pharmacy advise?)contact pcp  This is the patient's preferred pharmacy:  [facility] [place], [place] - [id number] [place] [address] Phone: [phone] Fax: [phone]   Is this the correct pharmacy for this prescription? yes If no, delete pharmacy and type the correct one.   Has the prescription been filled recently? no  Is the patient out of the medication? yes  Has the patient been seen for an appointment in the last year OR does the patient have an upcoming appointment? yes  Can we respond through MyChart? yes  Agent: Please be advised that Rx refills may take up to 3 business days. We ask that you follow-up with your pharmacy.

## 2024-04-24 NOTE — Progress Notes (Signed)
 Established Patient Office Visit  Subjective   Patient ID: Albert Mcdonald, male    DOB: 02-May-1971  Age: 53 y.o. MRN: [id number]  Chief Complaint  Patient presents with   Hospitalization Follow-up   spot     Skin spot on back of the thigh, red and itchy    HPI  Patient Active Problem List   Diagnosis Date Noted   Osteomyelitis (HCC) 04/06/2024   Type 2 diabetes mellitus (HCC) 12/09/2023   Abscess of left great toe 10/25/2023   Wound infection 10/25/2023   Toe osteomyelitis, 2nd left (HCC) 10/29/2022   Infected blister of toe of left foot 10/28/2022   Osteomyelitis of third toe of left foot (HCC) 07/30/2022   Acute osteomyelitis of left foot (HCC) 07/28/2022   Benign neoplasm of skin of right shoulder 05/11/2021   Achilles rupture, left 04/29/2021   Epidermoid cyst of skin 04/13/2021   Benign neoplasm of skin of neck 04/13/2021   Noncompliance with medication treatment due to overuse of medication 03/28/2021   Osteomyelitis of fifth toe of right foot (HCC) 10/23/2020   Gram-positive bacteremia 10/21/2020   Soft tissue infection of thoracic spine 10/21/2020   Cellulitis 10/19/2020   Discitis of cervicothoracic region 10/19/2020   Elevated WBCs 10/19/2020   Hyperglycemia 10/19/2020   Depression 08/09/2017   Diabetic neuropathy (HCC) 07/08/2017   Acute encephalopathy 03/15/2017   Drug overdose 03/14/2017   Acute metabolic encephalopathy 03/14/2017   Polysubstance abuse (HCC) 03/14/2017   Cocaine abuse (HCC)    Type 2 diabetes mellitus with complication (HCC)    Mixed hyperlipidemia    Essential hypertension    Restless leg    Family history of anesthesia complication    Cervical spondylosis with myelopathy 09/29/2012   OCD (obsessive compulsive disorder) 03/16/2011   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  Constitutional:      Appearance: Normal appearance.  HENT:     Head: Normocephalic.  Eyes:     Extraocular Movements: Extraocular movements intact.     Pupils: Pupils are equal, round, and reactive to light.  Cardiovascular:     Rate and Rhythm: Normal rate and regular rhythm.  Pulmonary:     Effort: Pulmonary effort is normal.     Breath sounds: Normal breath sounds.  Musculoskeletal:     Cervical back: Normal range of motion and neck supple.     Right foot: Normal.     Comments: Left foot is in a walking boot d/t recent left transmetatarsal amputation.  Skin:    Findings: Rash present. Rash is vesicular (Upper, posterior right thigh).  Neurological:     Mental Status: He is alert and oriented to person, place, and time.     Gait: Gait abnormal (Ambulating with a knee scooter).  Psychiatric:        Mood and Affect: Mood normal.        Thought Content: Thought  content normal.     No results found for any visits on 04/24/24.  Last metabolic panel Lab Results  Component Value Date   GLUCOSE 268 (H) 04/10/2024   NA 134 (L) 04/10/2024   K 4.1 04/10/2024   CL 101 04/10/2024   CO2 25 04/10/2024   BUN 14 04/10/2024   CREATININE 0.72 04/10/2024   GFRNONAA >60 04/10/2024   CALCIUM  9.2 04/10/2024   PHOS 2.8 07/29/2022   PROT 7.7 04/06/2024   ALBUMIN  3.8 04/06/2024   LABGLOB 2.9 03/22/2024   AGRATIO 2.8 (H) 04/09/2014   BILITOT 0.8 04/06/2024   ALKPHOS 103 04/06/2024   AST 14 (L) 04/06/2024   ALT 15 04/06/2024   ANIONGAP 8 04/10/2024   Last lipids Lab Results  Component Value Date   CHOL 166 03/22/2024   HDL 48 03/22/2024   LDLCALC 74 03/22/2024   TRIG 274 (H) 03/22/2024   CHOLHDL 3.5  03/22/2024   Last hemoglobin A1c Lab Results  Component Value Date   HGBA1C 10.6 (H) 04/06/2024      The 10-year ASCVD risk score (Arnett DK, et al., 2019) is: 6.6%    Assessment & Plan:   Problem List Items Addressed This Visit       Endocrine   Type 2 diabetes mellitus with complication (HCC) - Primary   Lab Results  Component Value Date   HGBA1C 10.6 (H) 04/06/2024   HGBA1C 11.8 (H) 12/09/2023   HGBA1C 11.9 (H) 10/24/2023   Recent A1c showed some improvement in blood sugar levels, however still remains quite high.  He states that he has been taking his Lantus  twice a day due to elevated blood sugar readings, and in light of recent surgery to amputate left toes.  Advised to continue with Lantus  20 units twice a day until he is more ambulatory and his blood sugar levels start to decrease.  Continue to work on low-carb, low sugar diet. CGM sensors refilled today.  Recommend follow-up in 3 months or sooner if blood sugars are remaining above 250.      Relevant Medications   Continuous Glucose Sensor (FREESTYLE LIBRE 2 PLUS SENSOR) MISC   Other Visit Diagnoses       Thigh shingles       Add oral acyclovir  since he is still within 72 hours of onset of symptoms.  Shingles outbreak likely due to stress of recent surgery.  May use topical anti-itch   Relevant Medications   acyclovir  (ZOVIRAX ) 800 MG tablet       Return in about 3 months (around 07/25/2024) for chronic follow-up with PCP, as needed.    Leita Longs, FNP

## 2024-04-27 ENCOUNTER — Other Ambulatory Visit: Payer: Self-pay | Admitting: Family Medicine

## 2024-04-27 MED ORDER — INSULIN PEN NEEDLE 32G X 4 MM MISC: 1 refills | Status: AC

## 2024-04-27 NOTE — Telephone Encounter (Signed)
 Copied from CRM [id number]. Topic: Clinical - Medication Refill >> Apr 27, 2024 10:44 AM Marissa P wrote: Medication: Insulin  Pen Needle 32G X 4 MM MISC  Has the patient contacted their pharmacy? Yes (Agent: If no, request that the patient contact the pharmacy for the refill. If patient does not wish to contact the pharmacy document the reason why and proceed with request.) (Agent: If yes, when and what did the pharmacy advise?)  This is the patient's preferred pharmacy:  [facility] [place], [place] - [id number] [place] [address] Phone: [phone] Fax: [phone]  Is this the correct pharmacy for this prescription? Yes If no, delete pharmacy and type the correct one.   Has the prescription been filled recently? Yes  Is the patient out of the medication? Yes  Has the patient been seen for an appointment in the last year OR does the patient have an upcoming appointment? Yes  Can we respond through MyChart? No  Agent: Please be advised that Rx refills may take up to 3 business days. We ask that you follow-up with your pharmacy.

## 2024-05-03 ENCOUNTER — Ambulatory Visit (INDEPENDENT_AMBULATORY_CARE_PROVIDER_SITE_OTHER): Admitting: Podiatry

## 2024-05-03 ENCOUNTER — Telehealth: Payer: Self-pay

## 2024-05-03 DIAGNOSIS — Z89432 Acquired absence of left foot: Secondary | ICD-10-CM

## 2024-05-03 NOTE — Progress Notes (Signed)
  Subjective:  Patient ID: EDRICK WHITEHORN, male    DOB: 05-16-1971,  MRN: [id number]  Chief Complaint  Patient presents with   Post-op Follow-up    Dressing changes at home. Has coban and dry gauze in place today. Staples and sutures intact.     DOS: 04/09/24 Procedure: 1. Transmetatarsal amputation of the left foot 2. Application of amniotic graft 5x5 cm, left foot     53 y.o. male seen for post op check.  Patient reports that he is doing well he has not seen much drainage.  He did have a nurse change the dressing once last week.  Says he has occasional neuropathic type pains but overall well-controlled.  Using knee scooter to keep off the left foot at this time.  Review of Systems: Negative except as noted in the HPI. Denies N/V/F/Ch.   Objective:   Constitutional Well developed. Well nourished.  Vascular Foot warm and well perfused. Capillary refill normal to all digits.   No calf pain with palpation  Neurologic Normal speech. Oriented to person, place, and time. Epicritic sensation diminished  Dermatologic L foot amputation site well coapted no dehiscence no drainage decreased edema and erythema from prior overall improved from prior very dry no maceration.   Orthopedic: S/p L foot TMA   Radiographs: Status post transmetatarsal amputation of all 5 rays of left foot.   Pathology:  A. FOOT, LEFT TRANSMETATARSAL, AMPUTATION:  - Soft tissue necrosis with underlying acute osteomyelitis.  - Proximal margin with cut surfaces of bone; negative for acute osteomyelitis.  - Status post amputation of 2nd and 3rd toes.   Micro: NA  Assessment:   1. History of transmetatarsal amputation of left foot (HCC)     S/p L foot TMA with amniotic graft application  Plan:  Patient was evaluated and treated and all questions answered.  3 weeks s/p L foot TMA with  -Progressing as expected post op, improved from prior with decreased erythema and edema and well coapted with no  maceration no evidence of dehiscence or residual infection -XR: expected post op changes -WB Status: NWB in post op shoe to LLE , using knee scooter until next appointment -Sutures: Removed all  sutures and after staples today will remove the remaining staples next appointment -Medications/ABX: No further antibiotics indicated at this time tramadol  as needed for pain -Dressing: Replaced with Xeroform dry gauze dressing.  Recommend we keep this on for a week.  Order home health nurse to come out for dressing change weekly until next appointment - F/u Plan: f/u in 3 weeks        Marolyn JULIANNA Honour, DPM [facility] / [facility]

## 2024-05-03 NOTE — Telephone Encounter (Signed)
 Home health orders faxed to [facility] per Dr. Emmitt request. Fax number is [phone] per [facility] with [place]. 1x weekly dressing changes to left foot starting next Thursday or Friday.

## 2024-05-08 ENCOUNTER — Telehealth: Payer: Self-pay

## 2024-05-08 NOTE — Telephone Encounter (Signed)
 Copied from CRM [id number]. Topic: Clinical - Medication Question >> May 08, 2024 12:34 PM Turkey B wrote: Reason for CRM: patient called in to ask if Dr Terry Gals is going to increase the mg of blood sugar med, Lantus , because he had problems with it going up, but its not high at the moment

## 2024-05-08 NOTE — Telephone Encounter (Signed)
 Patient needs to speak to Endocrinology patient is an established patient -[phone]  Whitney NP or Dr. Lenis

## 2024-05-09 ENCOUNTER — Telehealth: Payer: Self-pay | Admitting: *Deleted

## 2024-05-09 NOTE — Telephone Encounter (Signed)
 Patient called and was asking if he could give you his blood sugar readings.in the mornings the blood sugars are going up. Note patient was last seen on 12/23/2023 . Missed his appointment on 03/29/2024, currently there are not future appointments. He shares that since he was seen here, he has had his toes amputated from his left foot. Per appointments, he is going to have an amputation on the left foot starting at the metatarsal on August 28,2025.  His nurse wrapped his toes and ask that he get in touch with his PCP, patient states that he knows that he is taking Lantus  20 units. He then ask for refills on his medications and insulins.

## 2024-05-09 NOTE — Telephone Encounter (Signed)
 Pt advised with verbal understanding

## 2024-05-10 MED ORDER — LANTUS SOLOSTAR 100 UNIT/ML ~~LOC~~ SOPN
35.0000 [IU] | PEN_INJECTOR | Freq: Every day | SUBCUTANEOUS | 3 refills | Status: AC
Start: 2024-05-10 — End: ?

## 2024-05-10 NOTE — Telephone Encounter (Signed)
 Patient was called and a detailed message was left with Whitney's response to the message from yesterday. I ask that he please call us  with this information.

## 2024-05-10 NOTE — Telephone Encounter (Signed)
 If he can provide me with some concrete readings over the last 2 weeks, I don't mind adjusting meds.  He also needs to let us  know exactly what he is taking and when because it looks like his PCP may have adjusted it?

## 2024-05-10 NOTE — Telephone Encounter (Signed)
 Noted

## 2024-05-10 NOTE — Telephone Encounter (Signed)
 Patient has called and he shares that he has Lantus  then was given Basaglar . He is to inject 20 units at bedtime. Patient states that his blood sugars are running in the 300's and he will inject 20-26 units at bedtime. This is all that he is taking for blood sugars.  He has been on antibiotics, just finished some about 2 and 1/2 weeks ago. Patient rotated injection sites, he has seen some insulin  run out at times after injecting.   He has Freestyle Libre 2 and will bring this CGM by after lunch and his regular meter for review this afternoon.

## 2024-05-10 NOTE — Telephone Encounter (Signed)
 Increase Lantus /Basaglar  to 35 units nightly.  May need to adjust further if morning readings do not come closer to goal.  I will send in refill to [facility] for him.  His fasting readings are well above 250 right now.  Could be related to infection.

## 2024-05-14 NOTE — Telephone Encounter (Signed)
 error

## 2024-05-23 ENCOUNTER — Ambulatory Visit (INDEPENDENT_AMBULATORY_CARE_PROVIDER_SITE_OTHER)

## 2024-05-23 VITALS — BP 132/78 | HR 82 | Resp 14 | Ht 73.0 in | Wt 225.8 lb

## 2024-05-23 DIAGNOSIS — Z794 Long term (current) use of insulin: Secondary | ICD-10-CM | POA: Diagnosis not present

## 2024-05-23 DIAGNOSIS — Z1211 Encounter for screening for malignant neoplasm of colon: Secondary | ICD-10-CM | POA: Diagnosis not present

## 2024-05-23 DIAGNOSIS — Z0000 Encounter for general adult medical examination without abnormal findings: Secondary | ICD-10-CM

## 2024-05-23 DIAGNOSIS — E118 Type 2 diabetes mellitus with unspecified complications: Secondary | ICD-10-CM

## 2024-05-23 DIAGNOSIS — Z0001 Encounter for general adult medical examination with abnormal findings: Secondary | ICD-10-CM

## 2024-05-23 DIAGNOSIS — E1142 Type 2 diabetes mellitus with diabetic polyneuropathy: Secondary | ICD-10-CM | POA: Diagnosis not present

## 2024-05-23 NOTE — Progress Notes (Signed)
 Subjective:   Albert Mcdonald is a 53 y.o. who presents for a Medicare Wellness preventive visit.  As a reminder, Annual Wellness Visits don't include a physical exam, and some assessments may be limited, especially if this visit is performed virtually. We may recommend an in-person follow-up visit with your provider if needed.  Visit Complete: In person  Persons Participating in Visit: Patient.  AWV Questionnaire: No: Patient Medicare AWV questionnaire was not completed prior to this visit.  Cardiac Risk Factors include: advanced age (>60men, >65 women);diabetes mellitus;dyslipidemia;hypertension     Objective:    Today's Vitals   05/23/24 0852 05/23/24 0854  BP: (!) 140/93 132/78  Pulse: 83 82  Resp: 14   SpO2: 97%   Weight: 225 lb 12.8 oz (102.4 kg)   Height: 6' 1 (1.854 m)    Body mass index is 29.79 kg/m.     05/23/2024    9:01 AM 04/09/2024    6:51 AM 04/06/2024   12:56 PM 04/06/2024   12:51 PM 10/24/2023    8:41 AM 06/08/2023   12:14 PM 11/01/2022    7:23 AM  Advanced Directives  Does Patient Have a Medical Advance Directive? No No  No No No No  Would patient like information on creating a medical advance directive? Yes (MAU/Ambulatory/Procedural Areas - Information given) No - Patient declined No - Patient declined  Yes (ED - Information included in AVS) No - Patient declined No - Patient declined    Current Medications (verified) Outpatient Encounter Medications as of 05/23/2024  Medication Sig   amLODipine  (NORVASC ) 5 MG tablet Take 1 tablet (5 mg total) by mouth daily.   ammonium lactate  (AMLACTIN DAILY) 12 % lotion Apply 1 Application topically as needed for dry skin.   Continuous Glucose Receiver (FREESTYLE LIBRE 2 READER) DEVI Use to check blood sugar daily dx e11.65   Continuous Glucose Sensor (FREESTYLE LIBRE 2 PLUS SENSOR) MISC 1 each by Does not apply route every 14 (fourteen) days.   gabapentin  (NEURONTIN ) 300 MG capsule Take 1 capsule (300 mg total)  by mouth 2 (two) times daily.   insulin  glargine (LANTUS  SOLOSTAR) 100 UNIT/ML Solostar Pen Inject 35 Units into the skin at bedtime.   Insulin  Pen Needle 32G X 4 MM MISC Use to inject insulin  once daily   rosuvastatin  (CRESTOR ) 40 MG tablet Take 1 tablet (40 mg total) by mouth daily.   silver  sulfADIAZINE  (SILVADENE ) 1 % cream Apply 1 Application topically daily. (Patient taking differently: Apply 1 Application topically daily as needed (skin irritation).)   traMADol  (ULTRAM ) 50 MG tablet Take 1 tablet (50 mg total) by mouth every 6 (six) hours as needed for up to 12 doses.   Vitamin D , Ergocalciferol , (DRISDOL ) 1.25 MG (50000 UNIT) CAPS capsule Take 1 capsule (50,000 Units total) by mouth every 7 (seven) days.   No facility-administered encounter medications on file as of 05/23/2024.    Allergies (verified) Ace inhibitors   History: 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 ALS   Diabetes mellitus    Family history of anesthesia complication    Hyperlipidemia    Hypertension    Kleine-Levin syndrome    OCD (obsessive compulsive disorder)    Restless leg    Past Surgical History:  Procedure Laterality Date   AMPUTATION Right 02/26/2022   Procedure: AMPUTATION 5th TOE;  Surgeon: Harden Jerona GAILS, MD;  Location: [facility];  Service: Orthopedics;  Laterality: Right;   AMPUTATION TOE Left 07/30/2022   Procedure: AMPUTATION TOE, left third toe;  Surgeon: Kallie Manuelita BROCKS, MD;  Location: [facility];  Service: General;  Laterality: Left;   AMPUTATION TOE Left 11/01/2022   Procedure: AMPUTATION TOE, LEFT SECOND;  Surgeon: Kallie Manuelita BROCKS, MD;  Location: [facility];  Service: General;  Laterality: Left;   ANTERIOR CERVICAL DECOMP/DISCECTOMY FUSION  11/09/2012   Procedure: ANTERIOR CERVICAL DECOMPRESSION/DISCECTOMY FUSION 3 LEVELS C4-C7 ;  Surgeon: Donaciano Sprang, MD;  Location: [facility];  Service: Orthopedics;;  ACDF C4-C7     TRANSMETATARSAL AMPUTATION Left 04/09/2024   Procedure: AMPUTATION, FOOT, LEFT TRANSMETATARSAL;  Surgeon: Malvin Marsa FALCON, DPM;  Location: [facility];  Service: Orthopedics/Podiatry;  Laterality: Left;  POSSIBLE ACHILLES LENGTHENING   Family History  Problem Relation Age of Onset   Diabetes Mother    Cancer - Colon Neg Hx    Colon polyps Neg Hx    Social History   Socioeconomic History   Marital status: Single    Spouse name: Not on file   Number of children: Not on file   Years of education: Not on file   Highest education level: Not on file  Occupational History   Not on file  Tobacco Use   Smoking status: Never   Smokeless tobacco: Never  Vaping Use   Vaping status: Never Used  Substance and Sexual Activity   Alcohol use: Not Currently   Drug use: No   Sexual activity: Not on file  Other Topics Concern   Not on file  Social History Narrative   Not on file   Social Drivers of Health   Financial Resource Strain: Low Risk  (05/23/2024)   Overall Financial Resource Strain (CARDIA)    Difficulty of Paying Living Expenses: Not hard at all  Food Insecurity: No Food Insecurity (05/23/2024)   Hunger Vital Sign    Worried About Running Out of Food in the Last Year: Never true    Ran Out of Food in the Last Year: Never true  Transportation Needs: No Transportation Needs (05/23/2024)   PRAPARE - Administrator, Civil Service (Medical): No    Lack of Transportation (Non-Medical): No  Physical Activity: Sufficiently Active (05/23/2024)   Exercise Vital Sign    Days of Exercise per Week: 7 days    Minutes of Exercise per Session: 30 min  Stress: No Stress Concern Present (05/23/2024)   [employer] of Occupational Health - Occupational Stress Questionnaire    Feeling of Stress: Not at all  Social Connections: Moderately Isolated (05/23/2024)   Social Connection and Isolation Panel    Frequency of Communication with Friends and Family: More than three times a  week    Frequency of Social Gatherings with Friends and Family: More than three times a week    Attends Religious Services: More than 4 times per year    Active Member of [employer] or Organizations: No    Attends Engineer, structural: Never    Marital Status: Never married    Tobacco Counseling Counseling given: Yes    Clinical Intake:  Pre-visit preparation completed: Yes  Pain : No/denies pain     BMI - recorded: 29.79 Nutritional Status: BMI 25 -29 Overweight Diabetes: Yes CBG done?: Yes CBG resulted in Enter/ Edit results?: Yes Did pt. bring in CBG monitor from home?: No  Lab Results  Component Value Date   HGBA1C 10.6 (H) 04/06/2024   HGBA1C 11.8 (H)  12/09/2023   HGBA1C 11.9 (H) 10/24/2023     How often do you need to have someone help you when you read instructions, pamphlets, or other written materials from your doctor or pharmacy?: 1 - Never  Interpreter Needed?: No  Information entered by :: Stesha Neyens W CMA (AAMA)   Activities of Daily Living     05/23/2024    9:01 AM 04/06/2024   12:50 PM  In your present state of health, do you have any difficulty performing the following activities:  Hearing? 0 0  Vision? 0 0  Difficulty concentrating or making decisions? 0 0  Walking or climbing stairs? 0   Dressing or bathing? 0   Doing errands, shopping? 0   Preparing Food and eating ? N   Using the Toilet? N   In the past six months, have you accidently leaked urine? N   Do you have problems with loss of bowel control? N   Managing your Medications? N   Managing your Finances? N   Housekeeping or managing your Housekeeping? N     Patient Care Team: Del Wilhelmena Falter, Hilario, FNP as PCP - General (Family Medicine) Alvan, Dorn FALCON, MD as Consulting Physician (Cardiology) Standiford, Marsa FALCON, DPM as Consulting Physician (Podiatry) Lamount Ethan CROME, DPM as Consulting Physician (Podiatry) Therisa Benton PARAS, NP as Nurse Practitioner  (Endocrinology) Shaaron Lamar HERO, MD as Consulting Physician (Gastroenterology)  I have updated your Care Teams any recent Medical Services you may have received from other providers in the past year.     Assessment:   This is a routine wellness examination for Moataz.  Hearing/Vision screen Hearing Screening - Comments:: Patient denies any hearing difficulties.     Goals Addressed               This Visit's Progress     Remain active and healthy (pt-stated)        I want to keep on the same path that I'm on now and stay healthy       Depression Screen     05/23/2024    9:08 AM 04/24/2024   10:44 AM 01/20/2024    3:22 PM 12/09/2023    2:29 PM 08/09/2017    8:28 AM 06/01/2017    9:44 AM  PHQ 2/9 Scores  PHQ - 2 Score 0 0 0 0 4 1  PHQ- 9 Score 0 2   22 13     Fall Risk     05/23/2024    9:06 AM 03/22/2024    8:20 AM 01/20/2024    3:22 PM 01/20/2024    3:20 PM 12/09/2023    2:29 PM  Fall Risk   Falls in the past year? 0 0 0 0 0  Number falls in past yr: 0 0 0 0 0  Injury with Fall? 0 0 0 0 0  Risk for fall due to : No Fall Risks No Fall Risks     Follow up Falls evaluation completed;Education provided;Falls prevention discussed Falls evaluation completed Falls evaluation completed Falls evaluation completed Falls evaluation completed    MEDICARE RISK AT HOME:  Medicare Risk at Home Any stairs in or around the home?: No If so, are there any without handrails?: No Home free of loose throw rugs in walkways, pet beds, electrical cords, etc?: Yes Adequate lighting in your home to reduce risk of falls?: Yes Life alert?: No Use of a cane, walker or w/c?: No Grab bars in the bathroom?: No Shower chair or bench  in shower?: No Elevated toilet seat or a handicapped toilet?: Yes  TIMED UP AND GO:  Was the test performed?  Yes  Length of time to ambulate 10 feet: 5 sec Gait steady and fast without use of assistive device  Cognitive Function: 6CIT completed         05/23/2024    9:08 AM  6CIT Screen  What Year? 0 points  What month? 0 points  What time? 0 points  Count back from 20 0 points  Months in reverse 0 points  Repeat phrase 0 points  Total Score 0 points    Immunizations Immunization History  Administered Date(s) Administered   Influenza,inj,Quad PF,6+ Mos 07/08/2017   Moderna Sars-Covid-2 Vaccination 01/17/2020   Pneumococcal Polysaccharide-23 11/10/2012   Tdap 03/09/2014, 07/09/2017    Screening Tests Health Maintenance  Topic Date Due   Hepatitis B Vaccines 19-59 Average Risk (1 of 3 - 19+ 3-dose series) Never done   Zoster Vaccines- Shingrix (1 of 2) Never done   Pneumococcal Vaccine: 50+ Years (2 of 2 - PCV) 11/10/2013   Fecal DNA (Cologuard)  Never done   COVID-19 Vaccine (2 - Moderna risk series) 02/14/2020   INFLUENZA VACCINE  04/27/2024   HEMOGLOBIN A1C  10/07/2024   Diabetic kidney evaluation - Urine ACR  12/08/2024   FOOT EXAM  12/08/2024   OPHTHALMOLOGY EXAM  03/22/2025   Diabetic kidney evaluation - eGFR measurement  04/10/2025   Medicare Annual Wellness (AWV)  05/23/2025   DTaP/Tdap/Td (3 - Td or Tdap) 07/10/2027   Hepatitis C Screening  Completed   HIV Screening  Completed   HPV VACCINES  Aged Out   Meningococcal B Vaccine  Aged Out    Health Maintenance  Health Maintenance Due  Topic Date Due   Hepatitis B Vaccines 19-59 Average Risk (1 of 3 - 19+ 3-dose series) Never done   Zoster Vaccines- Shingrix (1 of 2) Never done   Pneumococcal Vaccine: 50+ Years (2 of 2 - PCV) 11/10/2013   Fecal DNA (Cologuard)  Never done   COVID-19 Vaccine (2 - Moderna risk series) 02/14/2020   INFLUENZA VACCINE  04/27/2024   Health Maintenance Items Addressed: Cologuard Ordered  Additional Screening:  Vision Screening: Recommended annual ophthalmology exams for early detection of glaucoma and other disorders of the eye. Would you like a referral to an eye doctor? Resources provided   Dental Screening: Recommended  annual dental exams for proper oral hygiene  Community Resource Referral / Chronic Care Management: CRR required this visit?  No   CCM required this visit?  No   Plan:    I have personally reviewed and noted the following in the patient's chart:   Medical and social history Use of alcohol, tobacco or illicit drugs  Current medications and supplements including opioid prescriptions. Patient is not currently taking opioid prescriptions. Functional ability and status Nutritional status Physical activity Advanced directives List of other physicians Hospitalizations, surgeries, and ER visits in previous 12 months Vitals Screenings to include cognitive, depression, and falls Referrals and appointments  In addition, I have reviewed and discussed with patient certain preventive protocols, quality metrics, and best practice recommendations. A written personalized care plan for preventive services as well as general preventive health recommendations were provided to patient.   Paisli Silfies, CMA   05/23/2024   After Visit Summary: (In Person-Printed) AVS printed and given to the patient  Notes: Nothing significant to report at this time.

## 2024-05-23 NOTE — Patient Instructions (Signed)
 Albert Mcdonald , Thank you for taking time out of your busy schedule to complete your Annual Wellness Visit with me. I enjoyed our conversation and look forward to speaking with you again next year. I, as well as your care team,  appreciate your ongoing commitment to your health goals. Please review the following plan we discussed and let me know if I can assist you in the future.  Your Game plan/ To Do List  Orders Placed: A Cologuard was ordered for you today. It's a colon cancer screening test that will arrive at your home soon. Cologuard is an easy-to-use, noninvasive test. You collect a sample in your own home and send it back for testing.  Kathrynn is $0 cost to you because Cologuard is a benefit to you.  -Your Cologuard kit will arrive in a white box. -Follow the instructions inside.  No prep is required.  -Return the kit via UPS prepaid shipping.  You'll use the same box it arrived in.  -Your result should be available within two weeks.  -Want to view a how-to-use video and get help returning your Cologuard Kit? Go to [email or web address] or scan the QR code on this page.   Follow up Visits: We will see or speak with you next year for your Next Medicare AWV with our clinical staff   Clinician Recommendations:  Aim for 30 minutes of exercise or brisk walking, 6-8 glasses of water, and 5 servings of fruits and vegetables each day.    Wishing you many blessings and good health during the next year until our next visit.  -Michaiah Holsopple   This is a list of the screenings recommended for you:  Health Maintenance  Topic Date Due   Hepatitis B Vaccine (1 of 3 - 19+ 3-dose series) Never done   Zoster (Shingles) Vaccine (1 of 2) Never done   Pneumococcal Vaccine for age over 71 (2 of 2 - PCV) 11/10/2013   Cologuard (Stool DNA test)  Never done   COVID-19 Vaccine (2 - Moderna risk series) 02/14/2020   Flu Shot  04/27/2024   Hemoglobin A1C  10/07/2024   Yearly kidney health urinalysis for diabetes   12/08/2024   Complete foot exam   12/08/2024   Eye exam for diabetics  03/22/2025   Yearly kidney function blood test for diabetes  04/10/2025   Medicare Annual Wellness Visit  05/23/2025   DTaP/Tdap/Td vaccine (3 - Td or Tdap) 07/10/2027   Hepatitis C Screening  Completed   HIV Screening  Completed   HPV Vaccine  Aged Out   Meningitis B Vaccine  Aged Out    Advanced directives: (Provided) Advance directive discussed with you today. I have provided a copy for you to complete at home and have notarized. Once this is complete, please bring a copy in to our office so we can scan it into your chart.  Advance Care Planning is important because it:  [x]  Makes sure you receive the medical care that is consistent with your values, goals, and preferences  [x]  It provides guidance to your family and loved ones and reduces their decisional burden about whether or not they are making the right decisions based on your wishes.  Follow the link provided in your after visit summary or read over the paperwork we have mailed to you to help you started getting your Advance Directives in place. If you need assistance in completing these, please reach out to us  so that we can help you!  See attachments for  Preventive Care and Fall Prevention Tips.

## 2024-05-24 ENCOUNTER — Encounter: Payer: Self-pay | Admitting: Podiatry

## 2024-05-24 ENCOUNTER — Ambulatory Visit (INDEPENDENT_AMBULATORY_CARE_PROVIDER_SITE_OTHER): Admitting: Podiatry

## 2024-05-24 ENCOUNTER — Ambulatory Visit: Payer: Self-pay | Admitting: Family Medicine

## 2024-05-24 DIAGNOSIS — Z9889 Other specified postprocedural states: Secondary | ICD-10-CM

## 2024-05-24 DIAGNOSIS — Z89432 Acquired absence of left foot: Secondary | ICD-10-CM

## 2024-05-24 DIAGNOSIS — M86172 Other acute osteomyelitis, left ankle and foot: Secondary | ICD-10-CM

## 2024-05-24 LAB — BAYER DCA HB A1C WAIVED: HB A1C (BAYER DCA - WAIVED): 10.2 % — ABNORMAL HIGH (ref 4.8–5.6)

## 2024-05-24 NOTE — Progress Notes (Signed)
  Subjective:  Patient ID: Albert Mcdonald, male    DOB: 1970/11/02,  MRN: [id number]  Chief Complaint  Patient presents with   Wound Check    Transmetatarsal amputation of the left foot. IDDM A1C 10.6. Neuropathy pains 6. Staples in tact and dressing changes.    DOS: 04/09/24 Procedure: 1. Transmetatarsal amputation of the left foot 2. Application of amniotic graft 5x5 cm, left foot     53 y.o. male seen for post op check.  Patient reports he has been doing well he is walking in postop shoe.  Has been doing dressing changes once weekly.  No concerns he says his pain is decreased.  Review of Systems: Negative except as noted in the HPI. Denies N/V/F/Ch.   Objective:   Constitutional Well developed. Well nourished.  Vascular Foot warm and well perfused. Capillary refill normal to all digits.   No calf pain with palpation  Neurologic Normal speech. Oriented to person, place, and time. Epicritic sensation diminished  Dermatologic L foot amputation site well coapted aside from a very small superficial area on the medial aspect of the incision where there is superficial gapping though no true dehiscence significant hyperkeratotic tissue on the lateral aspect of the incision.   Orthopedic: S/p L foot TMA   Radiographs: Status post transmetatarsal amputation of all 5 rays of left foot.   Pathology:  A. FOOT, LEFT TRANSMETATARSAL, AMPUTATION:  - Soft tissue necrosis with underlying acute osteomyelitis.  - Proximal margin with cut surfaces of bone; negative for acute osteomyelitis.  - Status post amputation of 2nd and 3rd toes.   Micro: NA  Assessment:   1. Acute osteomyelitis of left ankle or foot (HCC)   2. History of transmetatarsal amputation of left foot (HCC)   3. Post-operative state      S/p L foot TMA with amniotic graft application  Plan:  Patient was evaluated and treated and all questions answered.  6 weeks s/p L foot TMA with  -Progressing as expected  post op, continuing to heal well with no obvious evidence of infection or complication. -Recommend antibiotic ointment and large adhesive bandage over the small superficial wound area as it continues to heal in. -Okay to wash the foot with warm soapy water and apply antibiotic ointment and Band-Aid after -XR: expected post op changes -WB Status: WBAT to L foot in post op shoe -Sutures: Removed remaining staples -Medications/ABX: No further antibiotics indicated at this time tramadol  as needed for pain -Dressing: Antibiotic ointment and Band-Aid style large adhesive dressing or border foam dressing or gauze 4 x 4 and Kerlix as desired change anytime he gets the foot wet or once weekly - F/u Plan: Follow-up in 1 month        Marolyn JULIANNA Honour, DPM [facility] / [facility]

## 2024-06-06 ENCOUNTER — Other Ambulatory Visit: Payer: Self-pay | Admitting: Family Medicine

## 2024-06-06 MED ORDER — GLIPIZIDE 10 MG PO TABS: 10.0000 mg | ORAL_TABLET | Freq: Two times a day (BID) | ORAL | 3 refills | Status: AC

## 2024-06-06 NOTE — Progress Notes (Signed)
 Please inform patient Hemoglobin A1c 10.2 type 2 diabetes not controlled Plan: Start Glipizide  10 mg twice daily with meals- medication sent to your pharmacy  Continue  lantus  solostar 35 units at bedtime And Increase Lantus  by 2 units every day until fasting blood sugar is less than 150. Stay on that dose. Max dose 70 units a day. Do not exceed 70 units per day   Follow up with Endocrinology for optimal management   [facility] [address] [place], [place] [address] [phone]   Check Blood Glucose 2 times daily Fasting Blood Glucose (Before Meals): Target for Diabetes Management: 80-140     Postprandial Blood Glucose (2 Hours After Meals): Target for Diabetes Management: Less than 200   if blood sugar is less than <70 mg/dL, take 15 grams of fast-acting carbohydrates (e.g., 4 oz juice, 1 tbsp sugar or honey, or 3-4 glucose tablets). Recheck blood glucose in 15 minutes. If still low, repeat the above steps.          Your results show uncontrolled type 2 diabetes, which can lead to serious complications like heart disease, kidney damage, nerve problems, vision loss, and slow-healing wounds. To avoid these risks, it's crucial to take your medications as prescribed, maintain a healthy diet, exercise regularly, and monitor your blood sugar. Managing your diabetes now can prevent serious problems later

## 2024-06-13 ENCOUNTER — Ambulatory Visit (INDEPENDENT_AMBULATORY_CARE_PROVIDER_SITE_OTHER)

## 2024-06-13 DIAGNOSIS — Z23 Encounter for immunization: Secondary | ICD-10-CM

## 2024-06-13 NOTE — Progress Notes (Signed)
 Patient is in office today for a nurse visit for Immunization. Patient Injection was given in the  Right deltoid. Patient tolerated injection well.

## 2024-06-21 ENCOUNTER — Telehealth: Payer: Self-pay | Admitting: Podiatry

## 2024-06-21 ENCOUNTER — Ambulatory Visit (INDEPENDENT_AMBULATORY_CARE_PROVIDER_SITE_OTHER): Admitting: Podiatry

## 2024-06-21 DIAGNOSIS — M86172 Other acute osteomyelitis, left ankle and foot: Secondary | ICD-10-CM

## 2024-06-21 DIAGNOSIS — Z89432 Acquired absence of left foot: Secondary | ICD-10-CM

## 2024-06-21 DIAGNOSIS — Z9889 Other specified postprocedural states: Secondary | ICD-10-CM

## 2024-06-21 NOTE — Progress Notes (Signed)
  Subjective:  Patient ID: Albert Mcdonald, male    DOB: 01/18/1971,  MRN: [id number]  Chief Complaint  Patient presents with   Wound Check    POV#3 DOS: 04/09/24: 1. Transmetatarsal amputation of the left foot. 0 Pain. Slight neuropathy tingling. IDDM A1C 10.2.  Applying Triple antibiotic ointment.    DOS: 04/09/24 Procedure: 1. Transmetatarsal amputation of the left foot 2. Application of amniotic graft 5x5 cm, left foot     53 y.o. male seen for post op check.  Patient now about 10 weeks out from surgery.  He reports he is doing well he is wearing regular shoes has some neuropathy tingling but no pain.  Says a small wound on the medial aspect is fully healed now.    Review of Systems: Negative except as noted in the HPI. Denies N/V/F/Ch.   Objective:   Constitutional Well developed. Well nourished.  Vascular Foot warm and well perfused. Capillary refill normal to all digits.   No calf pain with palpation  Neurologic Normal speech. Oriented to person, place, and time. Epicritic sensation diminished  Dermatologic L foot amputation site now fully healed there is some hyperkeratotic tissue distally but no open wounds     Orthopedic: S/p L foot TMA   Radiographs: Status post transmetatarsal amputation of all 5 rays of left foot.   Pathology:  A. FOOT, LEFT TRANSMETATARSAL, AMPUTATION:  - Soft tissue necrosis with underlying acute osteomyelitis.  - Proximal margin with cut surfaces of bone; negative for acute osteomyelitis.  - Status post amputation of 2nd and 3rd toes.   Micro: NA  Assessment:   1. Acute osteomyelitis of left ankle or foot (HCC)   2. History of transmetatarsal amputation of left foot (HCC)   3. Post-operative state       S/p L foot TMA with amniotic graft application  Plan:  Patient was evaluated and treated and all questions answered.  10 weeks s/p L foot TMA with  -Progressing as expected post op, now fully healed doing very well no  issues - No further wound care required - No restrictions regard to washing the foot he can use lotion to prevent callus formation -XR: expected post op changes -WB Status: WBAT to L foot in regular shoe-will have him set up appointment with our pedorthist for custom insert with toe filler -Sutures: Previously removed -Medications/ABX: None required -Dressing: None required - F/u Plan: Follow-up as needed        Albert Mcdonald, DPM [facility] / [facility]

## 2024-06-21 NOTE — Telephone Encounter (Signed)
 Patient would like a referral for diabetic shoes and inserts.

## 2024-06-22 ENCOUNTER — Other Ambulatory Visit: Payer: Self-pay

## 2024-06-22 DIAGNOSIS — Z89432 Acquired absence of left foot: Secondary | ICD-10-CM

## 2024-06-22 DIAGNOSIS — E118 Type 2 diabetes mellitus with unspecified complications: Secondary | ICD-10-CM

## 2024-06-22 DIAGNOSIS — E1142 Type 2 diabetes mellitus with diabetic polyneuropathy: Secondary | ICD-10-CM

## 2024-06-22 NOTE — Telephone Encounter (Signed)
 Thank you :)

## 2024-06-25 ENCOUNTER — Other Ambulatory Visit

## 2024-07-23 ENCOUNTER — Ambulatory Visit

## 2024-08-07 ENCOUNTER — Ambulatory Visit

## 2024-10-02 ENCOUNTER — Ambulatory Visit (INDEPENDENT_AMBULATORY_CARE_PROVIDER_SITE_OTHER)

## 2024-10-02 VITALS — BP 147/105 | HR 102 | Ht 73.0 in | Wt 213.1 lb

## 2024-10-02 DIAGNOSIS — I10 Essential (primary) hypertension: Secondary | ICD-10-CM | POA: Diagnosis not present

## 2024-10-02 DIAGNOSIS — E118 Type 2 diabetes mellitus with unspecified complications: Secondary | ICD-10-CM

## 2024-10-02 DIAGNOSIS — Z794 Long term (current) use of insulin: Secondary | ICD-10-CM

## 2024-10-02 DIAGNOSIS — F419 Anxiety disorder, unspecified: Secondary | ICD-10-CM | POA: Diagnosis not present

## 2024-10-02 DIAGNOSIS — E119 Type 2 diabetes mellitus without complications: Secondary | ICD-10-CM

## 2024-10-02 MED ORDER — SERTRALINE HCL 50 MG PO TABS: 50.0000 mg | ORAL_TABLET | Freq: Every day | ORAL | 3 refills | Status: AC

## 2024-10-02 NOTE — Progress Notes (Unsigned)
 "  Established Patient Office Visit  Subjective   Patient ID: Albert Mcdonald, male    DOB: 1971/02/20  Age: 54 y.o. MRN: [id number]  Chief Complaint  Patient presents with   Medical Management of Chronic Issues    4 month follow up     HPI Discussed the use of AI scribe software for clinical note transcription with the patient, who gave verbal consent to proceed.  History of Present Illness    AGUSTUS MANE is a 54 year old male with a history of ADHD, diabetes, and hypertension who presents with concerns about anxiety and hyperactivity. He is accompanied by his sister, Earnestine.  Anxiety and hyperactivity - Long-standing symptoms of hyperactivity and anxiety persisting into adulthood - Previously treated with medication for hyperactivity as a child, discontinued due to adverse effects including feeling like a 'zombie' - No current pharmacologic treatment for hyperactivity or anxiety in adulthood - Impulsivity and difficulty with decision-making, compounded by autism - Significant anxiousness impacting sleep and causing irritability  Diabetes mellitus management - Diabetes has been difficult to control - Attempts to increase physical activity by walking more - Frequent consumption of sugary drinks, negatively affecting glycemic control - Most recent blood sugar level recorded at 10.2 - Currently using Lantus , administered at night  Hypertension management - History of hypertension - Takes antihypertensive medication at night - Active lifestyle with frequent walking, believes this impacts blood pressure readings  Social functioning - Participates in dancing once or twice a week, which he enjoys     Patient Active Problem List   Diagnosis Date Noted   Anxiety 10/03/2024   Osteomyelitis (HCC) 04/06/2024   Type 2 diabetes mellitus with complication (HCC) 12/09/2023   Abscess of left great toe 10/25/2023   Wound infection 10/25/2023   Toe osteomyelitis, 2nd left (HCC)  10/29/2022   Infected blister of toe of left foot 10/28/2022   Osteomyelitis of third toe of left foot (HCC) 07/30/2022   Acute osteomyelitis of left foot (HCC) 07/28/2022   Benign neoplasm of skin of right shoulder 05/11/2021   Achilles rupture, left 04/29/2021   Epidermoid cyst of skin 04/13/2021   Benign neoplasm of skin of neck 04/13/2021   Noncompliance with medication treatment due to overuse of medication 03/28/2021   Osteomyelitis of fifth toe of right foot (HCC) 10/23/2020   Gram-positive bacteremia 10/21/2020   Soft tissue infection of thoracic spine 10/21/2020   Cellulitis 10/19/2020   Discitis of cervicothoracic region 10/19/2020   Elevated WBCs 10/19/2020   Hyperglycemia 10/19/2020   Depression 08/09/2017   Diabetic neuropathy (HCC) 07/08/2017   Acute encephalopathy 03/15/2017   Drug overdose 03/14/2017   Acute metabolic encephalopathy 03/14/2017   Polysubstance abuse (HCC) 03/14/2017   Cocaine abuse (HCC)    Mixed hyperlipidemia    Essential hypertension    Restless leg    Family history of anesthesia complication    Cervical spondylosis with myelopathy 09/29/2012   OCD (obsessive compulsive disorder) 03/16/2011   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  Constitutional:      Appearance: Normal appearance.  HENT:     Head: Normocephalic.  Eyes:     Extraocular Movements: Extraocular movements intact.     Pupils: Pupils are equal, round, and reactive to light.  Cardiovascular:     Rate and Rhythm: Normal  rate and regular rhythm.  Pulmonary:     Effort: Pulmonary effort is normal.     Breath sounds: Normal breath sounds.  Musculoskeletal:     Cervical back: Normal range of motion and neck supple.  Neurological:     Mental Status: He is alert and oriented to person, place, and time.  Psychiatric:        Mood and Affect: Mood normal.        Thought Content: Thought content normal.     No results found for any visits on 10/02/24.    The 10-year ASCVD risk score (Arnett DK, et al., 2019) is: 10.7%    Assessment & Plan:   Problem List Items Addressed This Visit       Cardiovascular and Mediastinum   Essential hypertension - Primary   Recent elevated blood pressure readings. Non-adherence to medication noted. - Continue to monitor blood pressure and ensure adherence to medication regimen.        Endocrine   Type 2 diabetes mellitus with complication (HCC)   Recent HbA1c of 10.2 indicates poor glycemic control. Increased sugar intake from drinks noted. - Encouraged reduction of sugar intake, particularly from drinks. - Continue current diabetes management plan with Lantus  at night.        Other   Anxiety   Chronic anxiety with hyperactivity and impulsivity, possibly related to ADHD. Previous medication trials not well-tolerated. Zoloft  considered for its non-narcotic nature and potential to aid sleep. - Prescribed Zoloft  for generalized anxiety. - Instructed to monitor response to Zoloft  and adjust timing based on sleep effects. - Will consider referral to psychiatry if Zoloft  is ineffective.      Relevant Medications   sertraline  (ZOLOFT ) 50 MG tablet    Return in about 6 weeks (around 11/13/2024) for chronic follow-up with PCP.    Leita Longs, FNP  "

## 2024-10-03 DIAGNOSIS — F419 Anxiety disorder, unspecified: Secondary | ICD-10-CM | POA: Insufficient documentation

## 2024-10-03 NOTE — Assessment & Plan Note (Signed)
 Recent HbA1c of 10.2 indicates poor glycemic control. Increased sugar intake from drinks noted. - Encouraged reduction of sugar intake, particularly from drinks. - Continue current diabetes management plan with Lantus  at night.

## 2024-10-03 NOTE — Assessment & Plan Note (Signed)
 Recent elevated blood pressure readings. Non-adherence to medication noted. - Continue to monitor blood pressure and ensure adherence to medication regimen.

## 2024-10-03 NOTE — Assessment & Plan Note (Signed)
 Chronic anxiety with hyperactivity and impulsivity, possibly related to ADHD. Previous medication trials not well-tolerated. Zoloft  considered for its non-narcotic nature and potential to aid sleep. - Prescribed Zoloft  for generalized anxiety. - Instructed to monitor response to Zoloft  and adjust timing based on sleep effects. - Will consider referral to psychiatry if Zoloft  is ineffective.

## 2025-05-27 ENCOUNTER — Ambulatory Visit
